# Patient Record
Sex: Female | Born: 1945
Health system: Southern US, Community
[De-identification: ages and names within clinical notes are randomized; demographics above are authoritative.]

## PROBLEM LIST (undated history)

## (undated) DIAGNOSIS — E785 Hyperlipidemia, unspecified: Secondary | ICD-10-CM

## (undated) DIAGNOSIS — I672 Cerebral atherosclerosis: Secondary | ICD-10-CM

## (undated) DIAGNOSIS — C7800 Secondary malignant neoplasm of unspecified lung: Secondary | ICD-10-CM

## (undated) DIAGNOSIS — K219 Gastro-esophageal reflux disease without esophagitis: Secondary | ICD-10-CM

## (undated) DIAGNOSIS — G459 Transient cerebral ischemic attack, unspecified: Secondary | ICD-10-CM

## (undated) DIAGNOSIS — R0602 Shortness of breath: Secondary | ICD-10-CM

## (undated) DIAGNOSIS — M8580 Other specified disorders of bone density and structure, unspecified site: Secondary | ICD-10-CM

## (undated) DIAGNOSIS — R0789 Other chest pain: Secondary | ICD-10-CM

## (undated) DIAGNOSIS — K529 Noninfective gastroenteritis and colitis, unspecified: Secondary | ICD-10-CM

## (undated) DIAGNOSIS — G2581 Restless legs syndrome: Secondary | ICD-10-CM

## (undated) DIAGNOSIS — I10 Essential (primary) hypertension: Secondary | ICD-10-CM

## (undated) DIAGNOSIS — C801 Malignant (primary) neoplasm, unspecified: Secondary | ICD-10-CM

## (undated) HISTORY — DX: Other chest pain: R07.89

## (undated) HISTORY — DX: Restless legs syndrome: G25.81

## (undated) HISTORY — PX: ABDOMINAL EXPLORATION SURGERY: SHX538

## (undated) HISTORY — PX: CAROTID ENDARTERECTOMY: SUR193

## (undated) HISTORY — DX: Gastro-esophageal reflux disease without esophagitis: K21.9

## (undated) HISTORY — DX: Essential (primary) hypertension: I10

## (undated) HISTORY — DX: Hyperlipidemia, unspecified: E78.5

## (undated) HISTORY — DX: Cerebral atherosclerosis: I67.2

## (undated) HISTORY — DX: Shortness of breath: R06.02

## (undated) HISTORY — DX: Transient cerebral ischemic attack, unspecified: G45.9

---

## 1999-05-30 ENCOUNTER — Emergency Department (HOSPITAL_COMMUNITY): Admission: EM | Admit: 1999-05-30 | Discharge: 1999-05-30 | Payer: Self-pay | Admitting: Emergency Medicine

## 1999-05-30 ENCOUNTER — Encounter: Payer: Self-pay | Admitting: Emergency Medicine

## 1999-12-02 ENCOUNTER — Encounter: Payer: Self-pay | Admitting: Emergency Medicine

## 1999-12-02 ENCOUNTER — Inpatient Hospital Stay (HOSPITAL_COMMUNITY): Admission: EM | Admit: 1999-12-02 | Discharge: 1999-12-06 | Payer: Self-pay | Admitting: Emergency Medicine

## 1999-12-05 ENCOUNTER — Encounter: Payer: Self-pay | Admitting: Orthopedic Surgery

## 2001-08-19 ENCOUNTER — Encounter: Payer: Self-pay | Admitting: Family Medicine

## 2001-08-19 ENCOUNTER — Ambulatory Visit (HOSPITAL_COMMUNITY): Admission: RE | Admit: 2001-08-19 | Discharge: 2001-08-19 | Payer: Self-pay | Admitting: Family Medicine

## 2001-09-09 ENCOUNTER — Ambulatory Visit (HOSPITAL_COMMUNITY): Admission: RE | Admit: 2001-09-09 | Discharge: 2001-09-09 | Payer: Self-pay | Admitting: Family Medicine

## 2001-09-09 ENCOUNTER — Encounter: Payer: Self-pay | Admitting: Family Medicine

## 2001-11-14 ENCOUNTER — Emergency Department (HOSPITAL_COMMUNITY): Admission: EM | Admit: 2001-11-14 | Discharge: 2001-11-14 | Payer: Self-pay | Admitting: Emergency Medicine

## 2002-02-23 ENCOUNTER — Ambulatory Visit (HOSPITAL_COMMUNITY): Admission: RE | Admit: 2002-02-23 | Discharge: 2002-02-23 | Payer: Self-pay | Admitting: Internal Medicine

## 2002-02-23 ENCOUNTER — Encounter: Payer: Self-pay | Admitting: Internal Medicine

## 2002-02-24 ENCOUNTER — Encounter: Admission: RE | Admit: 2002-02-24 | Discharge: 2002-02-24 | Payer: Self-pay | Admitting: Internal Medicine

## 2002-02-24 ENCOUNTER — Encounter: Payer: Self-pay | Admitting: Internal Medicine

## 2003-02-23 ENCOUNTER — Ambulatory Visit (HOSPITAL_COMMUNITY): Admission: RE | Admit: 2003-02-23 | Discharge: 2003-02-23 | Payer: Self-pay | Admitting: Family Medicine

## 2003-02-23 ENCOUNTER — Encounter: Payer: Self-pay | Admitting: Family Medicine

## 2003-07-20 ENCOUNTER — Encounter: Payer: Self-pay | Admitting: Family Medicine

## 2003-07-20 ENCOUNTER — Ambulatory Visit (HOSPITAL_COMMUNITY): Admission: RE | Admit: 2003-07-20 | Discharge: 2003-07-20 | Payer: Self-pay | Admitting: Family Medicine

## 2003-08-13 ENCOUNTER — Encounter: Admission: RE | Admit: 2003-08-13 | Discharge: 2003-08-13 | Payer: Self-pay | Admitting: Family Medicine

## 2003-08-13 ENCOUNTER — Encounter: Payer: Self-pay | Admitting: Family Medicine

## 2003-09-03 ENCOUNTER — Encounter: Payer: Self-pay | Admitting: Family Medicine

## 2003-09-03 ENCOUNTER — Observation Stay (HOSPITAL_COMMUNITY): Admission: AD | Admit: 2003-09-03 | Discharge: 2003-09-04 | Payer: Self-pay | Admitting: Family Medicine

## 2004-02-13 ENCOUNTER — Emergency Department (HOSPITAL_COMMUNITY): Admission: EM | Admit: 2004-02-13 | Discharge: 2004-02-13 | Payer: Self-pay | Admitting: Emergency Medicine

## 2004-02-17 ENCOUNTER — Ambulatory Visit (HOSPITAL_COMMUNITY): Admission: RE | Admit: 2004-02-17 | Discharge: 2004-02-17 | Payer: Self-pay | Admitting: Family Medicine

## 2004-10-15 ENCOUNTER — Emergency Department (HOSPITAL_COMMUNITY): Admission: EM | Admit: 2004-10-15 | Discharge: 2004-10-15 | Payer: Self-pay | Admitting: Emergency Medicine

## 2004-10-16 ENCOUNTER — Ambulatory Visit: Payer: Self-pay | Admitting: Orthopedic Surgery

## 2004-10-24 ENCOUNTER — Ambulatory Visit (HOSPITAL_COMMUNITY): Admission: RE | Admit: 2004-10-24 | Discharge: 2004-10-24 | Payer: Self-pay | Admitting: Orthopedic Surgery

## 2004-11-01 ENCOUNTER — Ambulatory Visit: Payer: Self-pay | Admitting: Orthopedic Surgery

## 2004-11-06 ENCOUNTER — Encounter (HOSPITAL_COMMUNITY): Admission: RE | Admit: 2004-11-06 | Discharge: 2004-12-06 | Payer: Self-pay | Admitting: Orthopedic Surgery

## 2004-11-29 ENCOUNTER — Ambulatory Visit: Payer: Self-pay | Admitting: Orthopedic Surgery

## 2004-12-08 ENCOUNTER — Encounter (HOSPITAL_COMMUNITY): Admission: RE | Admit: 2004-12-08 | Discharge: 2005-01-07 | Payer: Self-pay | Admitting: Orthopedic Surgery

## 2004-12-27 ENCOUNTER — Ambulatory Visit: Payer: Self-pay | Admitting: Orthopedic Surgery

## 2005-01-17 ENCOUNTER — Ambulatory Visit: Payer: Self-pay | Admitting: Orthopedic Surgery

## 2005-01-23 ENCOUNTER — Ambulatory Visit (HOSPITAL_COMMUNITY): Admission: RE | Admit: 2005-01-23 | Discharge: 2005-01-23 | Payer: Self-pay | Admitting: Orthopedic Surgery

## 2005-01-23 ENCOUNTER — Ambulatory Visit: Payer: Self-pay | Admitting: Orthopedic Surgery

## 2005-01-25 ENCOUNTER — Encounter (HOSPITAL_COMMUNITY): Admission: RE | Admit: 2005-01-25 | Discharge: 2005-02-24 | Payer: Self-pay | Admitting: Orthopedic Surgery

## 2005-01-25 ENCOUNTER — Ambulatory Visit: Payer: Self-pay | Admitting: Orthopedic Surgery

## 2005-02-26 ENCOUNTER — Encounter (HOSPITAL_COMMUNITY): Admission: RE | Admit: 2005-02-26 | Discharge: 2005-03-28 | Payer: Self-pay | Admitting: Orthopedic Surgery

## 2005-03-14 ENCOUNTER — Ambulatory Visit: Payer: Self-pay | Admitting: Orthopedic Surgery

## 2005-03-30 DIAGNOSIS — R0602 Shortness of breath: Secondary | ICD-10-CM

## 2005-03-30 HISTORY — DX: Shortness of breath: R06.02

## 2005-04-03 ENCOUNTER — Encounter (HOSPITAL_COMMUNITY): Admission: RE | Admit: 2005-04-03 | Discharge: 2005-05-03 | Payer: Self-pay | Admitting: Orthopedic Surgery

## 2005-04-25 ENCOUNTER — Ambulatory Visit: Payer: Self-pay | Admitting: Orthopedic Surgery

## 2005-06-25 ENCOUNTER — Ambulatory Visit: Payer: Self-pay | Admitting: Orthopedic Surgery

## 2005-07-10 ENCOUNTER — Ambulatory Visit (HOSPITAL_COMMUNITY): Admission: RE | Admit: 2005-07-10 | Discharge: 2005-07-10 | Payer: Self-pay | Admitting: Cardiovascular Disease

## 2005-07-16 ENCOUNTER — Ambulatory Visit: Admission: RE | Admit: 2005-07-16 | Discharge: 2005-07-16 | Payer: Self-pay | Admitting: Cardiovascular Disease

## 2005-07-16 HISTORY — PX: OTHER SURGICAL HISTORY: SHX169

## 2005-08-09 ENCOUNTER — Inpatient Hospital Stay (HOSPITAL_COMMUNITY): Admission: RE | Admit: 2005-08-09 | Discharge: 2005-08-10 | Payer: Self-pay | Admitting: *Deleted

## 2005-08-09 ENCOUNTER — Encounter (INDEPENDENT_AMBULATORY_CARE_PROVIDER_SITE_OTHER): Payer: Self-pay | Admitting: *Deleted

## 2006-08-25 ENCOUNTER — Emergency Department (HOSPITAL_COMMUNITY): Admission: EM | Admit: 2006-08-25 | Discharge: 2006-08-25 | Payer: Self-pay | Admitting: Emergency Medicine

## 2007-04-17 ENCOUNTER — Ambulatory Visit: Payer: Self-pay | Admitting: *Deleted

## 2007-05-19 ENCOUNTER — Ambulatory Visit (HOSPITAL_COMMUNITY): Admission: RE | Admit: 2007-05-19 | Discharge: 2007-05-19 | Payer: Self-pay | Admitting: Family Medicine

## 2007-12-11 ENCOUNTER — Ambulatory Visit: Payer: Self-pay | Admitting: *Deleted

## 2008-03-10 ENCOUNTER — Emergency Department (HOSPITAL_COMMUNITY): Admission: EM | Admit: 2008-03-10 | Discharge: 2008-03-10 | Payer: Self-pay | Admitting: Emergency Medicine

## 2008-03-24 ENCOUNTER — Ambulatory Visit (HOSPITAL_COMMUNITY): Admission: RE | Admit: 2008-03-24 | Discharge: 2008-03-24 | Payer: Self-pay | Admitting: Family Medicine

## 2009-05-16 ENCOUNTER — Encounter: Admission: RE | Admit: 2009-05-16 | Discharge: 2009-05-16 | Payer: Self-pay | Admitting: Family Medicine

## 2009-06-29 ENCOUNTER — Emergency Department (HOSPITAL_COMMUNITY): Admission: EM | Admit: 2009-06-29 | Discharge: 2009-06-29 | Payer: Self-pay | Admitting: Emergency Medicine

## 2009-06-30 ENCOUNTER — Encounter: Admission: RE | Admit: 2009-06-30 | Discharge: 2009-06-30 | Payer: Self-pay | Admitting: Family Medicine

## 2009-12-23 ENCOUNTER — Encounter: Admission: RE | Admit: 2009-12-23 | Discharge: 2009-12-23 | Payer: Self-pay | Admitting: Family Medicine

## 2010-01-26 ENCOUNTER — Ambulatory Visit: Payer: Self-pay | Admitting: Cardiology

## 2010-01-26 ENCOUNTER — Inpatient Hospital Stay (HOSPITAL_COMMUNITY)
Admission: EM | Admit: 2010-01-26 | Discharge: 2010-01-27 | Payer: Self-pay | Source: Home / Self Care | Admitting: Emergency Medicine

## 2010-01-27 ENCOUNTER — Encounter (INDEPENDENT_AMBULATORY_CARE_PROVIDER_SITE_OTHER): Payer: Self-pay | Admitting: Internal Medicine

## 2010-01-27 DIAGNOSIS — G459 Transient cerebral ischemic attack, unspecified: Secondary | ICD-10-CM

## 2010-01-27 HISTORY — DX: Transient cerebral ischemic attack, unspecified: G45.9

## 2010-08-01 ENCOUNTER — Ambulatory Visit (HOSPITAL_COMMUNITY): Admission: RE | Admit: 2010-08-01 | Discharge: 2010-08-01 | Payer: Self-pay | Admitting: Family Medicine

## 2010-10-14 ENCOUNTER — Observation Stay (HOSPITAL_COMMUNITY)
Admission: EM | Admit: 2010-10-14 | Discharge: 2010-10-15 | Payer: Self-pay | Source: Home / Self Care | Admitting: Emergency Medicine

## 2010-11-26 ENCOUNTER — Emergency Department (HOSPITAL_COMMUNITY)
Admission: EM | Admit: 2010-11-26 | Discharge: 2010-11-27 | Payer: Self-pay | Source: Home / Self Care | Admitting: Emergency Medicine

## 2010-12-04 LAB — CBC
HCT: 36.7 % (ref 36.0–46.0)
Hemoglobin: 12.6 g/dL (ref 12.0–15.0)
MCH: 31.3 pg (ref 26.0–34.0)
MCHC: 34.3 g/dL (ref 30.0–36.0)
MCV: 91.1 fL (ref 78.0–100.0)
Platelets: 279 10*3/uL (ref 150–400)
RBC: 4.03 MIL/uL (ref 3.87–5.11)
RDW: 12.9 % (ref 11.5–15.5)
WBC: 10.5 10*3/uL (ref 4.0–10.5)

## 2010-12-04 LAB — BASIC METABOLIC PANEL
BUN: 7 mg/dL (ref 6–23)
CO2: 22 mEq/L (ref 19–32)
Calcium: 8.6 mg/dL (ref 8.4–10.5)
Chloride: 103 mEq/L (ref 96–112)
Creatinine, Ser: 0.69 mg/dL (ref 0.4–1.2)
GFR calc Af Amer: >60 mL/min (ref >60)
GFR calc non Af Amer: >60 mL/min (ref >60)
Glucose, Bld: 87 mg/dL (ref 70–99)
Potassium: 4.4 mEq/L (ref 3.5–5.1)
Sodium: 134 mEq/L — ABNORMAL LOW (ref 135–145)

## 2010-12-04 LAB — DIFFERENTIAL
Basophils Absolute: 0 10*3/uL (ref 0.0–0.1)
Basophils Relative: 0 % (ref 0–1)
Eosinophils Absolute: 0.3 10*3/uL (ref 0.0–0.7)
Eosinophils Relative: 3 % (ref 0–5)
Lymphocytes Relative: 9 % — ABNORMAL LOW (ref 12–46)
Lymphs Abs: 1 10*3/uL (ref 0.7–4.0)
Monocytes Absolute: 0.7 10*3/uL (ref 0.1–1.0)
Monocytes Relative: 7 % (ref 3–12)
Neutro Abs: 8.5 10*3/uL — ABNORMAL HIGH (ref 1.7–7.7)
Neutrophils Relative %: 81 % — ABNORMAL HIGH (ref 43–77)

## 2010-12-10 ENCOUNTER — Encounter: Payer: Self-pay | Admitting: Family Medicine

## 2010-12-11 ENCOUNTER — Encounter: Payer: Self-pay | Admitting: Family Medicine

## 2011-01-31 LAB — CBC
HCT: 38.6 % (ref 36.0–46.0)
HCT: 40.2 % (ref 36.0–46.0)
Hemoglobin: 12.4 g/dL (ref 12.0–15.0)
MCH: 31.4 pg (ref 26.0–34.0)
MCHC: 34.1 g/dL (ref 30.0–36.0)
MCV: 92.2 fL (ref 78.0–100.0)
Platelets: 304 10*3/uL (ref 150–400)
RDW: 13 % (ref 11.5–15.5)
WBC: 8.8 10*3/uL (ref 4.0–10.5)

## 2011-01-31 LAB — COMPREHENSIVE METABOLIC PANEL
Albumin: 4.1 g/dL (ref 3.5–5.2)
Alkaline Phosphatase: 86 U/L (ref 39–117)
BUN: 12 mg/dL (ref 6–23)
CO2: 24 mEq/L (ref 19–32)
Chloride: 103 mEq/L (ref 96–112)
Creatinine, Ser: 0.81 mg/dL (ref 0.4–1.2)
GFR calc non Af Amer: >60 mL/min (ref >60)
Glucose, Bld: 121 mg/dL — ABNORMAL HIGH (ref 70–99)
Potassium: 3.4 mEq/L — ABNORMAL LOW (ref 3.5–5.1)
Total Bilirubin: 0.7 mg/dL (ref 0.3–1.2)

## 2011-01-31 LAB — DIFFERENTIAL
Basophils Absolute: 0 10*3/uL (ref 0.0–0.1)
Basophils Relative: 0 % (ref 0–1)
Monocytes Absolute: 0.9 10*3/uL (ref 0.1–1.0)
Neutro Abs: 9.2 10*3/uL — ABNORMAL HIGH (ref 1.7–7.7)
Neutrophils Relative %: 68 % (ref 43–77)

## 2011-01-31 LAB — BRAIN NATRIURETIC PEPTIDE: Pro B Natriuretic peptide (BNP): <30 pg/mL (ref 0.0–100.0)

## 2011-01-31 LAB — CARDIAC PANEL(CRET KIN+CKTOT+MB+TROPI)
CK, MB: 4.9 ng/mL — ABNORMAL HIGH (ref 0.3–4.0)
Relative Index: 1.6 (ref 0.0–2.5)
Troponin I: 0.01 ng/mL (ref 0.00–0.06)

## 2011-01-31 LAB — URINALYSIS, ROUTINE W REFLEX MICROSCOPIC
Glucose, UA: NEGATIVE mg/dL
pH: 7 (ref 5.0–8.0)

## 2011-01-31 LAB — URINE CULTURE: Culture  Setup Time: 201111261725

## 2011-01-31 LAB — LIPASE, BLOOD: Lipase: 38 U/L (ref 11–59)

## 2011-01-31 LAB — CK TOTAL AND CKMB (NOT AT ARMC): Total CK: 343 U/L — ABNORMAL HIGH (ref 7–177)

## 2011-02-11 LAB — URINALYSIS, ROUTINE W REFLEX MICROSCOPIC
Bilirubin Urine: NEGATIVE
Ketones, ur: NEGATIVE mg/dL
Nitrite: NEGATIVE
Urobilinogen, UA: 0.2 mg/dL (ref 0.0–1.0)
pH: 6.5 (ref 5.0–8.0)

## 2011-02-11 LAB — CBC
HCT: 33.3 % — ABNORMAL LOW (ref 36.0–46.0)
Hemoglobin: 11.6 g/dL — ABNORMAL LOW (ref 12.0–15.0)
Hemoglobin: 11.8 g/dL — ABNORMAL LOW (ref 12.0–15.0)
MCHC: 34.6 g/dL (ref 30.0–36.0)
MCHC: 34.8 g/dL (ref 30.0–36.0)
RBC: 3.64 MIL/uL — ABNORMAL LOW (ref 3.87–5.11)
RDW: 13 % (ref 11.5–15.5)
WBC: 7.1 10*3/uL (ref 4.0–10.5)

## 2011-02-11 LAB — CARDIAC PANEL(CRET KIN+CKTOT+MB+TROPI)
Relative Index: 1.2 (ref 0.0–2.5)
Total CK: 247 U/L — ABNORMAL HIGH (ref 7–177)
Total CK: 253 U/L — ABNORMAL HIGH (ref 7–177)

## 2011-02-11 LAB — DIFFERENTIAL
Basophils Absolute: 0 10*3/uL (ref 0.0–0.1)
Basophils Relative: 1 % (ref 0–1)
Basophils Relative: 1 % (ref 0–1)
Eosinophils Absolute: 0.2 10*3/uL (ref 0.0–0.7)
Eosinophils Relative: 3 % (ref 0–5)
Eosinophils Relative: 4 % (ref 0–5)
Lymphocytes Relative: 43 % (ref 12–46)
Monocytes Absolute: 0.6 10*3/uL (ref 0.1–1.0)
Neutrophils Relative %: 53 % (ref 43–77)

## 2011-02-11 LAB — COMPREHENSIVE METABOLIC PANEL
ALT: 11 U/L (ref 0–35)
AST: 22 U/L (ref 0–37)
Alkaline Phosphatase: 68 U/L (ref 39–117)
CO2: 25 mEq/L (ref 19–32)
Chloride: 110 mEq/L (ref 96–112)
GFR calc non Af Amer: >60 mL/min (ref >60)
Glucose, Bld: 95 mg/dL (ref 70–99)
Potassium: 4.1 mEq/L (ref 3.5–5.1)
Sodium: 140 mEq/L (ref 135–145)
Total Bilirubin: 0.9 mg/dL (ref 0.3–1.2)

## 2011-02-11 LAB — LIPID PANEL
Cholesterol: 277 mg/dL — ABNORMAL HIGH (ref 0–200)
LDL Cholesterol: 207 mg/dL — ABNORMAL HIGH (ref 0–99)
VLDL: 31 mg/dL (ref 0–40)

## 2011-02-11 LAB — RAPID URINE DRUG SCREEN, HOSP PERFORMED
Cocaine: NOT DETECTED
Tetrahydrocannabinol: NOT DETECTED

## 2011-02-11 LAB — BASIC METABOLIC PANEL
CO2: 23 mEq/L (ref 19–32)
Chloride: 107 mEq/L (ref 96–112)
Glucose, Bld: 110 mg/dL — ABNORMAL HIGH (ref 70–99)
Potassium: 3.1 mEq/L — ABNORMAL LOW (ref 3.5–5.1)
Sodium: 138 mEq/L (ref 135–145)

## 2011-02-11 LAB — CK TOTAL AND CKMB (NOT AT ARMC): Relative Index: 1.1 (ref 0.0–2.5)

## 2011-02-11 LAB — POCT CARDIAC MARKERS: Troponin i, poc: <0.05 ng/mL (ref 0.00–0.09)

## 2011-02-11 LAB — RPR: RPR Ser Ql: NONREACTIVE

## 2011-02-11 LAB — APTT
aPTT: 24 seconds (ref 24–37)
aPTT: 27 seconds (ref 24–37)

## 2011-02-11 LAB — TSH: TSH: 0.839 u[IU]/mL (ref 0.350–4.500)

## 2011-02-11 LAB — PROTIME-INR
INR: 0.98 (ref 0.00–1.49)
INR: 1.06 (ref 0.00–1.49)

## 2011-03-08 ENCOUNTER — Other Ambulatory Visit (HOSPITAL_COMMUNITY): Payer: Self-pay | Admitting: Family Medicine

## 2011-03-08 ENCOUNTER — Ambulatory Visit (HOSPITAL_COMMUNITY)
Admission: RE | Admit: 2011-03-08 | Discharge: 2011-03-08 | Disposition: A | Discharge status: Home / Self Care | Payer: BC Managed Care – PPO | Source: Ambulatory Visit | Attending: Family Medicine | Admitting: Family Medicine

## 2011-03-08 DIAGNOSIS — R52 Pain, unspecified: Secondary | ICD-10-CM

## 2011-03-08 DIAGNOSIS — M5030 Other cervical disc degeneration, unspecified cervical region: Secondary | ICD-10-CM | POA: Insufficient documentation

## 2011-03-08 DIAGNOSIS — M25519 Pain in unspecified shoulder: Secondary | ICD-10-CM

## 2011-03-08 DIAGNOSIS — M542 Cervicalgia: Secondary | ICD-10-CM | POA: Insufficient documentation

## 2011-04-03 NOTE — Assessment & Plan Note (Signed)
OFFICE VISIT   Mullins, Lindsey L  DOB:  August 25, 1946                                       12/11/2007  CHART#:[id number]   The patient underwent a left carotid endarterectomy in September 2006.  She follows up on a regular basis for scans.  She presented to the  office today complaining of some tightness in her scar.   I have briefly looked at her today.  The scar looks entirely normal.  No  masses are palpable in her neck.  I did instruct her regarding some scar  massage over the area to help with this tight feeling.  She will return  per routine protocol.   Balinda Quails, M.D.  Electronically Signed   PGH/MEDQ  D:  12/11/2007  T:  12/12/2007  Job:  [id number]

## 2011-04-06 NOTE — H&P (Signed)
NAME:  Lindsey Mullins, Lindsey Mullins                        ACCOUNT NO.:  [id number]   MEDICAL RECORD NO.:  [id number]                   PATIENT TYPE:  INP   LOCATION:  A223                                 FACILITY:  [facility]   PHYSICIAN:  Annia Friendly. Loleta Chance, M.D.                DATE OF BIRTH:  1946/04/28   DATE OF ADMISSION:  09/03/2003  DATE OF DISCHARGE:                                HISTORY & PHYSICAL   HISTORY:  The patient is a 65 year old divorced gravida 2, para 2, AB 0  black female from [place], [place].  Chief complaint is feeling  faint like.  The patient has been feeling faint like off and on since 1100  hours on the day of admission.  Symptoms occurred at home.  She also  experienced a feeling of hotness followed by this feeling of faintness.  She  would attempt to relieve the symptoms by drinking a lot of cold water.  She  denied nausea, vomiting, chest pain, jaw pain, diarrhea, shortness of  breath, dysuria, gross hematuria, fever, chills, palpitations, melena, etc.   Medical history is negative for known heart disease.  Medical history is  positive for hypertension.  Medical history is also negative for diabetes,  tuberculosis, cancer, sickle cell, asthma, and seizure disorder.   Habits positive for former cigarette smoking x5 years.  Habits also negative  for use of ethanol and street drugs.   Prescribed med is Diovan HCT 80/12.5 mg p.o. every day.  Over-the-counter  med is ibuprofen 200 mg three tablets p.o. three times daily p.r.n.   Past medical history is positive for hospitalization for tonsillectomy;  hospitalization for pregnancy; hospitalization for pneumonia in her 35s at  [facility]; acute bronchiolitis with bronchospasm August 1998; and  exploratory abdominal surgery [facility] in January 2001.   Family history revealed father deceased age 32 cause unknown; mother living  age 34 with history of heart disease; one brother living age 58  health  unknown; one sister living age 3 good health; two daughters living ages 44  and 53 good health.   Review of systems is negative for epistaxis, bleeding gums, hematemesis,  hemoptysis, chronic cough, wheezing, dysuria, melena, diarrhea,  constipation, etc.  Review of systems is positive for chronic lower back  pain, chronic bilateral leg pain, episodic chronic left shoulder pain.   PHYSICAL EXAMINATION:  VITALS:  Temperature 97.1, pulse 66, respirations 20,  blood pressure 130/80, O2 saturation 97% on room air, weight 181 pounds.  GENERAL APPEARANCE:  Middle-aged medium-height alert black female who  appeared not to feel well but in no apparent respiratory distress.  SKIN:  Warm and dry.  HEAD:  Normocephalic.  EARS:  Normal auricles.  EYES:  Lids negative for ptosis.  Sclerae white.  Pupils round, equal, and  reactive to light.  Extraocular movement intact.  NOSE:  Negative for discharge.  MOUTH:  Positive for  upper and lower dentures.  No oral lesions.  Posterior  pharynx benign.  NECK:  Negative for lymphadenopathy or thyromegaly.  LUNGS:  Clear.  HEART:  Audible S1 and S2 without murmur.  Regular rate and rhythm.  BREASTS:  No skin changes.  Nipples erect.  ABDOMEN:  Slightly obese.  Hyperactive bowel sounds.  Positive old healed  mid-hypogastric surgical scar.  Soft.  Positive for mild left upper quadrant  tenderness to deep palpation.  No organomegaly.  No rebound tenderness.  PELVIC:  Deferred.  RECTAL:  Deferred.  EXTREMITIES:  No edema.  No joint swelling.  No joint redness.  No joint  hotness.  NEUROLOGICAL:  Alert and oriented to person, place, and time.  Cranial  nerves II-XII appear intact.  Babinski downward bilaterally.   IMPRESSION:  Primary near syncope.   SECONDARY DIAGNOSIS:  Hypertension.   ADDENDUM:  The patient is allergic to CODEINE and PENICILLIN.   PLAN:  Admit to telemetry, observation, IV normal saline at [facility], thyroid  function, cardiac  enzymes, CBC, MET-7, urinalysis, liver function tests,  fasting lipid profile, chest x-ray, EKG, bedside commode, Diovan HCT 80/12.5  mg p.o. every day, diet 4 g sodium/low cholesterol, one aspirin 325 mg p.o.  every day.     ___________________________________________                                         Annia Friendly. Loleta Chance, M.D.   Levonne Hubert  D:  09/03/2003  T:  09/03/2003  Job:  [id number]

## 2011-04-06 NOTE — H&P (Signed)
NAME:  Lindsey Mullins, Lindsey Mullins              ACCOUNT NO.:  [id number]   MEDICAL RECORD NO.:  [id number]          PATIENT TYPE:  AMB   LOCATION:  DAY                           FACILITY:  [facility]   PHYSICIAN:  Vickki Hearing, M.D.DATE OF BIRTH:  [birth year]   DATE OF ADMISSION:  DATE OF DISCHARGE:  LH                                HISTORY & PHYSICAL   CHIEF COMPLAINT:  Left shoulder pain.   HISTORY:  Lindsey Mullins is 65 years old.  She had left shoulder pain for almost two  years.  It worsened in November of 2005.  She went to the emergency room.  She was given a cortisone injection IM and an anti-inflammatory, which was  probably Toradol.  She presented to [place].  We worked her up and treated her  for bursal side partial tearing of the rotator cuff, most likely stage 2  disease.  She had significant motion loss and was sent to physical therapy  to improve her range of motion.  She improved it to above 120 degrees in  flexion and now presents for subacromial decompression.   ALLERGIES:  She has allergies to CODEINE and PENICILLIN.   FAMILY PHYSICIAN:  Dr. Loleta Chance.   REVIEW OF SYSTEMS:  She listed as normal.   MEDICAL PROBLEMS:  She said none.   SURGERY:  Exploratory lap.   MEDICATIONS:  Baby aspirin.   FAMILY HISTORY:  Negative.   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   PHYSICAL EXAMINATION:  WEIGHT:  173 pounds.  VITAL SIGNS:  Pulse 82, respiratory rate 16.  GENERAL APPEARANCE:  Development, nutrition, grooming and hygiene are  normal.  Her gait and station were normal.  LYMPH NODES:  Normal in the cervical, supraclavicular and axillae.  EXTREMITIES:  Her left shoulder had passive range of motion 120-130 degrees.  She had a positive impingement sign and a weak rotator cuff, but intact  rotator cuff.  There was normal neurovascular to the extremities.  NEUROLOGIC:  Her reflexes were normal.   LABORATORY DATA:  Her x-rays showed a normal glenohumeral joint and a  tendinosis  of the rotator cuff tendon.   DIAGNOSIS:  Rotator cuff syndrome, left shoulder.   PLAN:  1.  Arthroscopy of left shoulder.  2.  Subacromial decompression.      SEH/MEDQ  D:  01/22/2005  T:  01/22/2005  Job:  [id number]

## 2011-04-06 NOTE — Discharge Summary (Signed)
[place]. [facility]  Patient:    Lindsey Mullins                      MRN: [id number] Adm. Date:  [id number] Disc. Date: [id number] Attending:  Stephenie Acres                           Discharge Summary  ADMISSION DIAGNOSIS:  Acute abdomen.  DISCHARGE DIAGNOSES: 1. Acute abdomen. 2. Pneumonia.  PROCEDURES:  Exploratory laparotomy on December 02, 1999.  HISTORY OF PRESENT ILLNESS:  The patient is a 65 year old black female with acute onset of abdominal pain, chills, nausea, and diarrhea for 24 hours.  The pain continued through the night prior to presentation.  She denies any prior episodes of this.  She had a white count in the emergency room of 13,000, and a CT scan which showed inflammatory changes of the mid small bowel.  HOSPITAL COURSE:  The patient was admitted, given IV antibiotics, taken to the operating room, and underwent exploratory laparotomy, which showed no evidence of pathology.  The patient remained febrile for the next 24 hours.  Chest x-ray showed a right basilar infiltrate.  She was continued on IV antibiotics. Blood and urine cultures were negative.  Her diet was increased over the next 48 hours and, by postoperative day #3, she was beginning a regular diet. Saturations were 95% on room air.  Repeat chest x-ray continued to show a right infiltrate, but it was improved.  The patient remained afebrile for approximately 24 hours, and was ready for discharge home.  DISCHARGE MEDICATIONS: 1. Vicodin for pain. 2. Augmentin 875 mg twice daily.  FOLLOW-UP:  With me in one week, and with her family doctor in three to four days for further follow-up of her pneumonia.  CONDITION ON DISCHARGE:  Good and improved. DD:  02/07/00 TD:  02/07/00 Job: [id number]

## 2011-04-06 NOTE — H&P (Signed)
NAME:  Lindsey Mullins, Lindsey Mullins              ACCOUNT NO.:  [id number]   MEDICAL RECORD NO.:  [id number]          PATIENT TYPE:  AMB   LOCATION:  DFTL                         FACILITY:  [facility]   PHYSICIAN:  Zollie Scale, PA          DATE OF BIRTH:  [birth year]   DATE OF ADMISSION:  07/16/2005  DATE OF DISCHARGE:  07/16/2005                                HISTORY & PHYSICAL   Lindsey Mullins is a 65 year old female who was referred by Dr. Mirna Mires to  Dr. Allyson Sabal for chest pain.  Echocardiogram revealed normal LV size and  function.  Stress test was notable for hypertensive response on exercise and  deconditioning but no ischemia, with good LV function.  Carotid Doppler done  as an outpatient for a left carotid bruit revealed a greater than 70% left  internal carotid artery stenosis.  She saw Dr. Allyson Sabal in followup.  Her chest  pain had improved with Prilosec.  Dr. Allyson Sabal felt she should be set up for an  outpatient angiogram to evaluate her carotid stenosis.  This was done  electively on July 16, 2005.   PAST MEDICAL HISTORY:  Remarkable for hyperlipidemia.   HOME MEDICATIONS:  Prilosec daily and aspirin.   She is allergic to PENICILLIN.   SOCIAL HISTORY:  Patient is divorced.  Two children.  One grandchild.   FAMILY HISTORY:  Unremarkable.   REVIEW OF SYSTEMS:  Essentially unremarkable except for noted above.  Please  see office records from July 02, 2005 for complete review of systems.   PHYSICAL EXAMINATION:  VITAL SIGNS:  Blood pressure 108/70, pulse 66,  respirations 12.  GENERAL:  She is a well-developed and well-nourished African-American female  in no acute distress.  HEENT:  Normocephalic.  Extraocular movements are intact.  Sclerae are  anicteric.  NECK:  Without bruits.  CHEST:  Clear to auscultation and percussion.  CARDIAC:  Regular rate and rhythm without murmur, rub or gallop.  ABDOMEN:  Nontender, nondistended.  EXTREMITIES:  Intact distal pulses.  NEURO:  Grossly intact.   She is awake, alert and oriented.  Cooperative.   IMPRESSION:  1.  Abnormal carotid Doppler study with greater than 70% left internal      carotid artery stenosis by Doppler.  2.  Chest pain with negative Cardiolite, responded to PPI.   PLAN:  Patient will be admitted for elective cerebral angiogram.      Abelino Derrick, P.A.    ______________________________  Zollie Scale, PA    LKK/MEDQ  D:  11/15/2005  T:  11/15/2005  Job:  [id number]

## 2011-04-06 NOTE — Op Note (Signed)
NAME:  Lindsey Mullins, Lindsey Mullins              ACCOUNT NO.:  [id number]   MEDICAL RECORD NO.:  [id number]          PATIENT TYPE:  AMB   LOCATION:  DAY                           FACILITY:  [facility]   PHYSICIAN:  Vickki Hearing, M.D.DATE OF BIRTH:  Feb 14, 1946   DATE OF PROCEDURE:  01/23/2005  DATE OF DISCHARGE:                                 OPERATIVE REPORT   PREOPERATIVE DIAGNOSIS:  Rotator cuff syndrome, left shoulder, possible  rotator cuff tear.   POSTOPERATIVE DIAGNOSIS:  Rotator cuff syndrome, left shoulder.   PROCEDURE:  Arthroscopy, left shoulder, arthroscopic subacromial  decompression.   SURGEON:  Dr. Romeo Apple.   ANESTHETIC:  General.   OPERATIVE FINDINGS:  Bursitis subacromial space, intra-articular structures  normal, undersurface rotator cuff normal, biceps tendon normal, glenohumeral  joint normal, ligaments and axillary pouch normal, subacromial space with  significant and thickened bursal tissue which showed generative changes in  the rotator cuff.   PRIMARY INDICATION:  Pain.   HISTORY:  A 65 year old female presented with a locked shoulder, was sent  therapy for adhesive capsulitis. Once she regained a range of motion, her  pain persisted. She had signs and symptoms of impingement syndrome and  rotator cuff syndrome and presented for surgery when conservative treatment  failed.   The patient was identified in preop holding area as [name]. The  history and physical was updated, and her left shoulder was countersigned by  the surgeon after being marked by the patient. She was given Ancef and taken  to the operating room where general anesthetic was administered. She was  placed in the beach chair position, and a sterile prep and prep and drape  were performed. Time-out was taken and completed as required, confirming  that the patient had received antibiotics with an hour, all equipment was  present in the room, that the proper limb and proper the patient had  been  prepped for surgery.   Diagnostic arthroscopy was performed through a posterior approach.  Glenohumeral joint was evaluated completely. All structures were normal.  Scope was placed in subacromial space, and a lateral portal was established  with the assistance of spinal needle, and a cannula was inserted in the  subacromial space. The subacromial space was debrided of the thickened  bursal tissue over to the level of the [facility] joint, and a decompression was  performed with a  straight barrel bur. Subacromial space was irrigated and the portals were  closed with 3-0 Prolene. Subacromial space was injected 30 cc of Marcaine.  Sterile bandages were applied along with a CryoCuff. The patient was  extubated and taken to the recovery room in stable condition.      SEH/MEDQ  D:  01/23/2005  T:  01/23/2005  Job:  [id number]

## 2011-04-06 NOTE — Discharge Summary (Signed)
NAME:  Lindsey Mullins, Lindsey Mullins              ACCOUNT NO.:  [id number]   MEDICAL RECORD NO.:  [id number]          PATIENT TYPE:  INP   LOCATION:  3307                         FACILITY:  [facility]   PHYSICIAN:  Balinda Quails, M.D.    DATE OF BIRTH:  11/29/1945   DATE OF ADMISSION:  08/09/2005  DATE OF DISCHARGE:  08/10/2005                                 DISCHARGE SUMMARY   PRIMARY DISCHARGE DIAGNOSIS:  Left internal carotid artery stenosis.   SECONDARY DIAGNOSIS:  Hypertension.   IN-HOSPITAL OPERATIONS AND PROCEDURES:  Left carotid endarterectomy with  Dacron patch angioplasty.   HISTORY AND PHYSICAL AND HOSPITAL COURSE:  The patient is a 65 year old  African-American female who was referred to Dr. Madilyn Fireman by Dr. Allyson Sabal following  diagnostic cerebral arteriography revealing a severe left internal carotid  artery stenosis.  She had been initially seen and evaluated by Dr. Domingo Sep  in [place].  Her carotid Doppler evaluation revealed a severe left  internal carotid artery stenosis.  The patient has no history of documented  stroke.  She denied sensory, motor or visual deficit.  She was seen and  evaluated by Dr. Madilyn Fireman.  Dr. Madilyn Fireman discussed with her undergoing left  carotid endarterectomy.  He discussed risks and benefits of the procedure.  The patient acknowledged her understanding and wished to proceed.  Surgery  was scheduled for August 09, 2005.   The patient was taken to the operating room on August 09, 2005, where she  underwent left carotid endarterectomy with Dacron patch angioplasty.  The  patient tolerated this procedure well and was transferred up to the  intensive care unit in stable condition.  Postoperatively the patient was  seen to have neurologic intact, hemodynamically stable.  Postoperative day  1, the patient was out of bed, ambulating well.  She was in normal sinus  rhythm.  Incision was dry and intact and healing well.  She remained  hemodynamically stable.   Neurologic remained intact.  She was saturating  greater than 90% on room air.  The patient was afebrile.   The patient was discharged to home postoperative day 1, August 10, 2005,  in stable condition.  A follow-up appointment will be scheduled with Dr.  Madilyn Fireman in three weeks.  Ms. Vanderstelt received instructions on diet, activity  level and incisional care.  She was told no driving until released to do so,  no heavy lifting over 10 pounds.  She was told to ambulate three to four  times per day, progress as tolerated.  She was told to continue her  breathing exercises.  The patient was told she was allowed to shower,  washing her incisions using soap and water.  She is to contact the office if  she develops any drainage or opening from any of her incision sites.  She  was educated on diet to be low-fat, low-salt.  The patient acknowledged her  understanding.   DISCHARGE MEDICATIONS:  1.  Tylox one to two tablets p.o. q.4-6h. p.r.n. pain.  2.  Caduet 5/10 mg daily.  3.  Aspirin 81 mg daily.  4.  Plavix 75  mg daily.  5.  Skelaxin 800 mg p.r.n.  6.  Prilosec over-the-counter 20 mg daily.      [name], PA      [name], M.D.  Electronically Signed    KMD/MEDQ  D:  10/02/2005  T:  10/03/2005  Job:  [id number]

## 2011-04-06 NOTE — Cardiovascular Report (Signed)
NAME:  RAKIYAH, ESCH              ACCOUNT NO.:  [id number]   MEDICAL RECORD NO.:  [id number]          PATIENT TYPE:  AMB   LOCATION:  DFTL                         FACILITY:  [facility]   PHYSICIAN:  Nanetta Batty, M.D.   DATE OF BIRTH:  1946-06-27   DATE OF PROCEDURE:  07/16/2005  DATE OF DISCHARGE:                              CARDIAC CATHETERIZATION   Ms. Ode is a 65 year old African American female patient of Dr.  Roque Lias referred to me for evaluation of carotid disease.  She has had an  echo that reveals normal LV function and a cardiac stress test which was  normal.  Dopplers reveal high grade left ICA stenosis.  She is  neurologically asymptomatic on aspirin.  She presents now for outpatient  diagnostic cerebral angiography.   DESCRIPTION OF PROCEDURE:  The patient was brought to the sixth floor [facility] in the postabsorptive state.  Her right groin was prepped and shaved in the usual sterile fashion.  1%  Xylocaine was used for local anesthesia.  A 5 French sheath was inserted  into the right femoral artery using standard Seldinger technique.  A 5  French pigtail catheter and JV1 catheters were used for arch angiography and  four vessel cerebral angiography.  Visipaque dye was used for the entirety  of the case.  Retrograde aortic pressures were monitored during the case.  Dr. Susa Griffins was the primary operator and I was the proctor for the  case.   ANGIOGRAPHIC RESULTS:  1.  Arch aortogram:  1-2 arch.  2.  Right vertebral:  There was a proximal kink that did not appear to be      hemodynamically significant and a 50% smooth segmental basilar artery      stenosis on the right side.  There was competitive flow noted.  3.  Right carotid:  50% ulcerated proximal right ICA stenosis.  The      remaining carotid filled the anterior cerebral arteries.  4.  Left carotid:  60-70% left external carotid artery stenosis.  90%  proximal left ICA stenosis with normal intracranial anatomy.  There was      occasional reflux into the anterior cerebral on the left.  5.  Left vertebral:  Normal intracranial and extracranial anatomy.   IMPRESSION:  Ms. Pryce has a mild ulcerated right ICA stenosis and a high  grade left ICA stenosis.  She is neurologically asymptomatic.  I am going to  begin her on Plavix and discharge her home as an outpatient today.  She will see me back in the office later this week at which time I will  refer to Dr. Liliane Bade for surgical evaluation for left carotid  endarterectomy.  The patient is low risk and asymptomatic, thus, not a stent  candidate.  The sheath was removed and pressure was held on the groin to  achieve hemostasis.  The patient left the lab in stable condition.      Nanetta Batty, M.D.  Electronically Signed     JB/MEDQ  D:  07/16/2005  T:  07/16/2005  Job:  [id number]  cc:   Dani Gobble, MD  Fax: [phone]   Annia Friendly. Loleta Chance, MD  [address]  [place]  [place] [address]  Fax: [phone]

## 2011-04-06 NOTE — Op Note (Signed)
[place]. [facility]  Patient:    Lindsey Mullins                      MRN: [id number] Proc. Date: 12/02/99 Adm. Date:  [id number] Attending:  Stephenie Acres                           Operative Report  PREOPERATIVE DIAGNOSIS:  Peritonitis.  POSTOPERATIVE DIAGNOSIS:  No pathology, intraabdominal pathology found.  PROCEDURE:  Exploratory laparotomy.  SURGEON:  Catalina Lunger, M.D.  ASSISTANT:  Anselm Pancoast. Zachery Dakins, M.D.  ANESTHESIA:  General.  DESCRIPTION OF PROCEDURE:  Patient was taken to the operating room, placed in the supine position.  After adequate anesthesia was induced using endotracheal tube, the abdomen was prepped and draped in a normal sterile fashion.  Using a lower vertical midline incision, dissected down to the fascia.  Fascia was opened vertically.  The peritoneum was entered.  There was no evidence of free fluid in the abdomen.  The GI tract was run from the stomach to the rectum with particular attention paid to the proximal and mid small bowel.  There was no evidence of ischemia, inflammation or Crohns disease, only significant finding was a large uterus with no free fluid in the cul-de-sac and no evidence of ischemia. Gallbladder was within normal limits.  Finding no further pathology, the fascia was closed with a running #1 Novofil.  Skin was closed with staples.  The patient tolerated the procedure well and went to  PACU in good condition. DD:  12/02/99 TD:  12/02/99 Job: [id number]

## 2012-04-01 ENCOUNTER — Encounter: Payer: Self-pay | Admitting: Family Medicine

## 2012-04-01 ENCOUNTER — Ambulatory Visit (INDEPENDENT_AMBULATORY_CARE_PROVIDER_SITE_OTHER): Payer: BC Managed Care – PPO | Admitting: Family Medicine

## 2012-04-01 VITALS — BP 122/80 | HR 67 | Resp 16 | Ht 66.5 in | Wt 187.0 lb

## 2012-04-01 DIAGNOSIS — I10 Essential (primary) hypertension: Secondary | ICD-10-CM

## 2012-04-01 DIAGNOSIS — R1310 Dysphagia, unspecified: Secondary | ICD-10-CM

## 2012-04-01 DIAGNOSIS — G2581 Restless legs syndrome: Secondary | ICD-10-CM

## 2012-04-01 DIAGNOSIS — E785 Hyperlipidemia, unspecified: Secondary | ICD-10-CM

## 2012-04-01 MED ORDER — CARVEDILOL 3.125 MG PO TABS: 3.1250 mg | ORAL_TABLET | Freq: Every day | ORAL | Status: DC

## 2012-04-01 NOTE — Patient Instructions (Addendum)
Continue your current medications I will obtain your labs and records Welcome to the practice! F/U in 2 months

## 2012-04-02 ENCOUNTER — Encounter: Payer: Self-pay | Admitting: Family Medicine

## 2012-04-02 DIAGNOSIS — G2581 Restless legs syndrome: Secondary | ICD-10-CM | POA: Insufficient documentation

## 2012-04-02 DIAGNOSIS — E785 Hyperlipidemia, unspecified: Secondary | ICD-10-CM | POA: Insufficient documentation

## 2012-04-02 DIAGNOSIS — I10 Essential (primary) hypertension: Secondary | ICD-10-CM | POA: Insufficient documentation

## 2012-04-02 DIAGNOSIS — R1310 Dysphagia, unspecified: Secondary | ICD-10-CM | POA: Insufficient documentation

## 2012-04-02 NOTE — Assessment & Plan Note (Signed)
Blood pressure at goal, no change to meds, continue ASA

## 2012-04-02 NOTE — Assessment & Plan Note (Signed)
Chronic problem for patient, will obtain records

## 2012-04-02 NOTE — Progress Notes (Signed)
  Subjective:    Patient ID: Lindsey Mullins, female    DOB: Jun 15, 1946, 66 y.o.   MRN: [id number]  HPI Pt here to establish care, previous PCP Dr.Hill, states she was dismissed for being a "problem patient" as she asked for her mothers meds monthly and confronted staff about it. GYN-Dr. Brandy Hale- no recent evaluation no recent mammogram Dr. Allyson Sabal- cardiologist  HTN- has seen cardiology in the past secondary to elevated blood pressure, carotid stenosis/ s/p intervention and some chest pain. BP well controlled on coreg Hyperlipidemia- she does not tolerate a lot of medications, has difficulty swallowing , was recently tried on Welchol but pt states it makes foods too thick for her to drink. She has had her swallowing evaluated with EGD at [facility] with no reason found RLS- started on requip but this caused HA and dizziness therefore she stopped  Worked at [employer], now substitute teacher   Review of Systems   GEN- denies fatigue, fever, weight loss,weakness, recent illness HEENT- denies eye drainage, change in vision, nasal discharge, CVS- denies chest pain, palpitations RESP- denies SOB, cough, wheeze ABD- denies N/V, change in stools, abd pain GU- denies dysuria, hematuria, dribbling, incontinence MSK- denies joint pain, muscle aches, injury Neuro- denies headache, dizziness, syncope, seizure activity      Objective:   Physical Exam GEN- NAD, alert and oriented x3 HEENT- PERRL, EOMI, non injected sclera, pink conjunctiva, MMM, oropharynx clear Neck- Supple, no thryomegaly CVS- RRR, no murmur RESP-CTAB ABD-NABS,soft, NT,ND EXT- No edema Pulses- Radial, DP- 2+        Assessment & Plan:

## 2012-04-02 NOTE — Assessment & Plan Note (Signed)
Pt has not tolerated meds, I will obtain her most recent labs, continue fish oil

## 2012-04-02 NOTE — Assessment & Plan Note (Signed)
Unable to tolerate requip

## 2012-04-18 ENCOUNTER — Encounter: Payer: Self-pay | Admitting: Family Medicine

## 2012-04-18 ENCOUNTER — Ambulatory Visit (INDEPENDENT_AMBULATORY_CARE_PROVIDER_SITE_OTHER): Payer: Medicare Other | Admitting: Family Medicine

## 2012-04-18 VITALS — BP 130/84 | HR 74 | Resp 16 | Ht 66.5 in | Wt 185.0 lb

## 2012-04-18 DIAGNOSIS — G2581 Restless legs syndrome: Secondary | ICD-10-CM

## 2012-04-18 DIAGNOSIS — M549 Dorsalgia, unspecified: Secondary | ICD-10-CM

## 2012-04-18 DIAGNOSIS — M5441 Lumbago with sciatica, right side: Secondary | ICD-10-CM | POA: Insufficient documentation

## 2012-04-18 MED ORDER — GABAPENTIN 300 MG PO CAPS: 300.0000 mg | ORAL_CAPSULE | Freq: Every day | ORAL | Status: DC

## 2012-04-18 NOTE — Progress Notes (Signed)
  Subjective:    Patient ID: Lindsey Mullins, female    DOB: 1946/01/16, 66 y.o.   MRN: [id number]  HPI Patient presents with bilateral leg pain. She has history of bilateral leg pain worse at night when she is resting. She is diagnoses her restless leg syndrome. She's been tried on medications before but had side effects. Last night pain was so severe she always with the ER. She was worried about blood clot. She denies any leg swelling. She does have back pain that radiates down. Has known degenerative disc disease. No change in bowel or bladder. She also admits to heartburn which she uses TUMS as needed   Review of Systems  GEN- denies fatigue, fever, weight loss,weakness, recent illness HEENT- denies eye drainage, change in vision, nasal discharge, CVS- denies chest pain, palpitations RESP- denies SOB, cough, wheeze ABD- denies N/V, change in stools, abd pain GU- denies dysuria, hematuria, dribbling, incontinence MSK- + joint pain,+ muscle aches, injury Neuro- denies headache, dizziness, syncope, seizure activity      Objective:   Physical Exam GEN- NAD, alert and oriented x3 CVS- RRR, no murmur RESP-CTAB ABD-NABS,soft, NT,ND EXT- No edema Back- Neg SLR, spine non tender, pain with flexion HIP- normal IR/ER Neuro- no focal deficits, DTR symmetric Pulses- Radial, DP- 2+      Assessment & Plan:

## 2012-04-18 NOTE — Patient Instructions (Signed)
Start the neurontin at bedtime  Continue other meds Use the TUMS for acid reflux  F/U as previous

## 2012-04-18 NOTE — Assessment & Plan Note (Signed)
She does have back pain with radiculopathy she's had an x-ray back in 2011 which shows facet arthritis. At this time we will try the Neurontin. If she is not much improved and I will obtain an MRI of the back.

## 2012-04-18 NOTE — Assessment & Plan Note (Signed)
I think restless legs is the main problem with her pain at nighttime. I will try her on low-dose Neurontin. She does not want anything for actual pain because of possible addiction. She was not able to tolerate the Requip.

## 2012-05-23 DIAGNOSIS — R0789 Other chest pain: Secondary | ICD-10-CM

## 2012-05-23 HISTORY — DX: Other chest pain: R07.89

## 2012-06-03 ENCOUNTER — Ambulatory Visit (INDEPENDENT_AMBULATORY_CARE_PROVIDER_SITE_OTHER): Payer: Medicare Other | Admitting: Family Medicine

## 2012-06-03 ENCOUNTER — Encounter: Payer: Self-pay | Admitting: Family Medicine

## 2012-06-03 VITALS — BP 126/70 | HR 62 | Resp 18 | Ht 66.5 in | Wt 185.1 lb

## 2012-06-03 DIAGNOSIS — E663 Overweight: Secondary | ICD-10-CM

## 2012-06-03 DIAGNOSIS — E785 Hyperlipidemia, unspecified: Secondary | ICD-10-CM

## 2012-06-03 DIAGNOSIS — G2581 Restless legs syndrome: Secondary | ICD-10-CM

## 2012-06-03 DIAGNOSIS — I10 Essential (primary) hypertension: Secondary | ICD-10-CM

## 2012-06-03 NOTE — Patient Instructions (Addendum)
Continue current medications Labs to be done fasting - we will call about results Call the doctor in [place] about Mammogram F/U 3 months

## 2012-06-03 NOTE — Progress Notes (Signed)
  Subjective:    Patient ID: Lindsey Mullins, female    DOB: [birth year], 66 y.o.   MRN: [id number]  HPI Pt here to f/u chronic medical problems, seen by cardiology stress test done, she is awaiting results. Due for FLP No difficulties with blood pressure medication Leg pain much improved, she did not have to start neurontin She is due for mammogram she states she will set this up in [place]-she wants this done no specific She declines colonoscopy at this time because she became very ill after taking a pill that was evidently prescribed for bowel cleanout   Review of Systems  GEN- denies fatigue, fever, weight loss,weakness, recent illness HEENT- denies eye drainage, change in vision, nasal discharge, CVS- denies chest pain, palpitations RESP- denies SOB, cough, wheeze ABD- denies N/V, change in stools, abd pain GU- denies dysuria, hematuria, dribbling, incontinence MSK- + joint pain, muscle aches, injury Neuro- denies headache, dizziness, syncope, seizure activity      Objective:   Physical Exam GEN- NAD, alert and oriented x3 HEENT- PERRL, EOMI, non injected sclera, pink conjunctiva, MMM, oropharynx clear Neck- Supple,  CVS- RRR, no murmur RESP-CTAB EXT- No edema Pulses- Radial, DP- 2+        Assessment & Plan:

## 2012-06-04 DIAGNOSIS — E663 Overweight: Secondary | ICD-10-CM | POA: Insufficient documentation

## 2012-06-04 NOTE — Assessment & Plan Note (Signed)
Plan to recheck FLP, she was on Welchol but never on statin therapy, she can be very sensitive to medications

## 2012-06-04 NOTE — Assessment & Plan Note (Signed)
Well controlled no change to meds 

## 2012-06-04 NOTE — Assessment & Plan Note (Signed)
This has improved without starting gabapentin, will monitor, she understands to start if her symptoms return

## 2012-06-06 ENCOUNTER — Other Ambulatory Visit: Payer: Self-pay | Admitting: Family Medicine

## 2012-06-06 DIAGNOSIS — Z1231 Encounter for screening mammogram for malignant neoplasm of breast: Secondary | ICD-10-CM

## 2012-06-07 LAB — COMPREHENSIVE METABOLIC PANEL
AST: 24 U/L (ref 0–37)
Albumin: 4.5 g/dL (ref 3.5–5.2)
Alkaline Phosphatase: 75 U/L (ref 39–117)
Potassium: 4.1 mEq/L (ref 3.5–5.3)
Sodium: 141 mEq/L (ref 135–145)
Total Bilirubin: 0.6 mg/dL (ref 0.3–1.2)
Total Protein: 7.6 g/dL (ref 6.0–8.3)

## 2012-06-07 LAB — CBC
MCH: 31.4 pg (ref 26.0–34.0)
MCHC: 34 g/dL (ref 30.0–36.0)
MCV: 92.4 fL (ref 78.0–100.0)
Platelets: 333 10*3/uL (ref 150–400)
RBC: 4.07 MIL/uL (ref 3.87–5.11)

## 2012-06-07 LAB — LIPID PANEL
Cholesterol: 309 mg/dL — ABNORMAL HIGH (ref 0–200)
Total CHOL/HDL Ratio: 6.7 Ratio
VLDL: 43 mg/dL — ABNORMAL HIGH (ref 0–40)

## 2012-06-07 LAB — TSH: TSH: 1.989 u[IU]/mL (ref 0.350–4.500)

## 2012-06-09 ENCOUNTER — Ambulatory Visit: Payer: Medicare Other

## 2012-06-09 ENCOUNTER — Telehealth: Payer: Self-pay | Admitting: Family Medicine

## 2012-06-09 MED ORDER — SIMVASTATIN 20 MG PO TABS: 20.0000 mg | ORAL_TABLET | Freq: Every evening | ORAL | Status: DC

## 2012-06-09 NOTE — Telephone Encounter (Signed)
Given results, start zocor 20mg  , pt very sensitive to medications

## 2012-06-10 ENCOUNTER — Ambulatory Visit
Admission: RE | Admit: 2012-06-10 | Discharge: 2012-06-10 | Disposition: A | Discharge status: Home / Self Care | Payer: Medicare Other | Source: Ambulatory Visit | Attending: Family Medicine | Admitting: Family Medicine

## 2012-06-10 DIAGNOSIS — Z1231 Encounter for screening mammogram for malignant neoplasm of breast: Secondary | ICD-10-CM

## 2012-09-04 ENCOUNTER — Ambulatory Visit: Payer: Medicare Other | Admitting: Family Medicine

## 2012-09-09 ENCOUNTER — Encounter: Payer: Self-pay | Admitting: Family Medicine

## 2012-09-19 ENCOUNTER — Ambulatory Visit (INDEPENDENT_AMBULATORY_CARE_PROVIDER_SITE_OTHER): Payer: Medicare Other | Admitting: Family Medicine

## 2012-09-19 ENCOUNTER — Encounter: Payer: Self-pay | Admitting: Family Medicine

## 2012-09-19 VITALS — BP 122/74 | HR 66 | Resp 15 | Ht 66.5 in | Wt 184.0 lb

## 2012-09-19 DIAGNOSIS — M21612 Bunion of left foot: Secondary | ICD-10-CM

## 2012-09-19 DIAGNOSIS — E785 Hyperlipidemia, unspecified: Secondary | ICD-10-CM

## 2012-09-19 DIAGNOSIS — I10 Essential (primary) hypertension: Secondary | ICD-10-CM

## 2012-09-19 DIAGNOSIS — G2581 Restless legs syndrome: Secondary | ICD-10-CM

## 2012-09-19 DIAGNOSIS — M21619 Bunion of unspecified foot: Secondary | ICD-10-CM

## 2012-09-19 MED ORDER — SIMVASTATIN 20 MG PO TABS: 20.0000 mg | ORAL_TABLET | Freq: Every evening | ORAL | Status: DC

## 2012-09-19 NOTE — Patient Instructions (Addendum)
Try the gabapentin at bedtime Continue all other medications Get the cholesterol done  Referral to foot doctor F/U 4 months

## 2012-09-21 ENCOUNTER — Encounter: Payer: Self-pay | Admitting: Family Medicine

## 2012-09-21 DIAGNOSIS — M21612 Bunion of left foot: Secondary | ICD-10-CM | POA: Insufficient documentation

## 2012-09-21 NOTE — Assessment & Plan Note (Signed)
Well controlled 

## 2012-09-21 NOTE — Assessment & Plan Note (Signed)
Recheck FLP on statin Has f/u with [facility] for carotids

## 2012-09-21 NOTE — Assessment & Plan Note (Signed)
Advised to start gabapentin

## 2012-09-21 NOTE — Assessment & Plan Note (Signed)
Refer to podiatry

## 2012-09-21 NOTE — Progress Notes (Signed)
  Subjective:    Patient ID: Lindsey Mullins, female    DOB: 1946/06/24, 66 y.o.   MRN: [id number]  HPI PT here to f/u chronic medical problems. Doing well, legs have been acting up worse at night has not tried the gabapentin. Bunion on left foot causing a lot of pain when walking, wants new foot doctor Needs cholesterol rechecked.    Review of Systems  GEN- denies fatigue, fever, weight loss,weakness, recent illness HEENT- denies eye drainage, change in vision, nasal discharge, CVS- denies chest pain, palpitations RESP- denies SOB, cough, wheeze ABD- denies N/V, change in stools, abd pain GU- denies dysuria, hematuria, dribbling, incontinence MSK- denies joint pain, +muscle aches, injury Neuro- denies headache, dizziness, syncope, seizure activity      Objective:   Physical Exam GEN- NAD, alert and oriented x3 HEENT- PERRL, EOMI, non injected sclera, pink conjunctiva, MMM, oropharynx clear Neck- Supple,  CVS- RRR, no murmur RESP-CTAB EXT- No edema, bilateral bunions L>R, TTP over bunion region Pulses- Radial, DP- 2+       Assessment & Plan:

## 2012-09-29 DIAGNOSIS — I672 Cerebral atherosclerosis: Secondary | ICD-10-CM

## 2012-09-29 HISTORY — DX: Cerebral atherosclerosis: I67.2

## 2012-09-30 LAB — LIPID PANEL
LDL Cholesterol: 107 mg/dL — ABNORMAL HIGH (ref 0–99)
Triglycerides: 167 mg/dL — ABNORMAL HIGH (ref <150)

## 2012-10-30 ENCOUNTER — Other Ambulatory Visit: Payer: Self-pay | Admitting: Family Medicine

## 2012-10-30 MED ORDER — CYCLOBENZAPRINE HCL 5 MG PO TABS: 5.0000 mg | ORAL_TABLET | Freq: Every evening | ORAL | Status: DC | PRN

## 2012-10-30 NOTE — Progress Notes (Signed)
Pt came in with mother neurontin has not helped she would like to stop it makes her drowsy and she does not want to take any meds during the day Her leg spasms and cramps are worse She has not had a lot of rest Trial of Flexeril for sleep and cramps D/C neurontin

## 2012-12-04 ENCOUNTER — Telehealth: Payer: Self-pay | Admitting: Family Medicine

## 2012-12-04 ENCOUNTER — Other Ambulatory Visit: Payer: Self-pay

## 2012-12-04 MED ORDER — CARVEDILOL 3.125 MG PO TABS: 3.1250 mg | ORAL_TABLET | Freq: Every day | ORAL | Status: DC

## 2012-12-04 NOTE — Telephone Encounter (Signed)
Refill sent.

## 2013-01-06 ENCOUNTER — Other Ambulatory Visit: Payer: Self-pay | Admitting: Family Medicine

## 2013-01-12 ENCOUNTER — Ambulatory Visit: Payer: Medicare Other | Admitting: Family Medicine

## 2013-01-19 ENCOUNTER — Ambulatory Visit: Payer: Medicare Other | Admitting: Family Medicine

## 2013-01-23 ENCOUNTER — Ambulatory Visit: Payer: Medicare Other | Admitting: Family Medicine

## 2013-01-27 ENCOUNTER — Ambulatory Visit: Payer: Medicare Other | Admitting: Family Medicine

## 2013-01-28 ENCOUNTER — Encounter: Payer: Self-pay | Admitting: Family Medicine

## 2013-02-16 ENCOUNTER — Other Ambulatory Visit: Payer: Self-pay | Admitting: Family Medicine

## 2013-04-17 ENCOUNTER — Encounter: Payer: Self-pay | Admitting: Cardiovascular Disease

## 2013-04-20 ENCOUNTER — Encounter: Payer: Self-pay | Admitting: Cardiovascular Disease

## 2013-04-20 ENCOUNTER — Ambulatory Visit (INDEPENDENT_AMBULATORY_CARE_PROVIDER_SITE_OTHER): Payer: Medicare Other | Admitting: Cardiovascular Disease

## 2013-04-20 VITALS — BP 128/82 | HR 71 | Ht 66.5 in | Wt 181.0 lb

## 2013-04-20 DIAGNOSIS — I739 Peripheral vascular disease, unspecified: Secondary | ICD-10-CM

## 2013-04-20 DIAGNOSIS — I10 Essential (primary) hypertension: Secondary | ICD-10-CM

## 2013-04-20 DIAGNOSIS — E785 Hyperlipidemia, unspecified: Secondary | ICD-10-CM

## 2013-04-20 NOTE — Assessment & Plan Note (Signed)
On statin therapy followed by her primary care physician 

## 2013-04-20 NOTE — Patient Instructions (Addendum)
Your physician wants you to follow-up in: 12 MONTHS You will receive a reminder letter in the mail two months in advance. If you don't receive a letter, please call our office to schedule the follow-up appointment.   You will be scheduled for carotid doppler in the next couple of weeks

## 2013-04-20 NOTE — Assessment & Plan Note (Signed)
Well-controlled on current medications 

## 2013-04-20 NOTE — Progress Notes (Signed)
04/20/2013 Lindsey Mullins   1945/11/29  [id number]  Primary Physician Milinda Antis, MD Primary Cardiologist: Runell Gess MD FACP,FACC,FAHA, FSCAI   HPI:  .The patient is a 67 year old, mildly overweight, divorced, African American female, mother of two, grandmother to one grandchild, who was seen by Corine Shelter on May 06, 2012. The last time I saw her in the office was August 09, 2010. She has a history of hypertension, hyperlipidemia, and vascular disease. I performed an angiogram on her on July 14, 2005, revealing a high grade left internal carotid artery stenosis. She underwent elective left carotid endarterectomy by Dr. Liliane Bade on August 09, 2005, with followup Dopplers done in our office as recently as last October showing a widely patent endarterectomy site with moderate right ICA stenosis. She remains neurologically asymptomatic. She was seen in the office by Corine Shelter with complaints of chest burning. She also has a history of GERD. She has had multiple negative Myoviews in the past and a Lexiscan Myoview performed on May 23, 2012, was negative as well. Her most recent lipid profile performed August 08, 2012, revealed a total cholesterol of 309, LDL of 220, HDL of 46.since I saw her a year ago she denies chest pain or shortness of breath      Current Outpatient Prescriptions  Medication Sig Dispense Refill  . aspirin 81 MG tablet Take 81 mg by mouth daily.      . calcium carbonate (OS-CAL) 1250 MG chewable tablet Chew 1 tablet by mouth daily.      . carvedilol (COREG) 3.125 MG tablet Take 1 tablet (3.125 mg total) by mouth daily.  90 tablet  1  . cyclobenzaprine (FLEXERIL) 5 MG tablet TAKE ONE TABLET BY MOUTH AT BEDTIME AS NEEDED FOR MUSCLE SPASM AND FOR LEG CRAMP/SPASM  30 tablet  3  . nitroGLYCERIN (NITROSTAT) 0.4 MG SL tablet Place 0.4 mg under the tongue every 5 (five) minutes as needed.      . Omega-3 Krill Oil 500 MG CAPS Take 1 capsule by mouth daily.       [name] omeprazole (PRILOSEC) 20 MG capsule Take 20 mg by mouth daily.      . simvastatin (ZOCOR) 20 MG tablet TAKE ONE TABLET BY MOUTH EVERY EVENING  30 tablet  4  . gabapentin (NEURONTIN) 300 MG capsule Take 1 capsule (300 mg total) by mouth at bedtime.  30 capsule  1   No current facility-administered medications for this visit.    Allergies  Allergen Reactions  . Codeine   . Lipitor (Atorvastatin) Other (See Comments)    Myalgias  . Lisinopril Cough  . Penicillins   . Pravastatin Other (See Comments)    Myalgias    History   Social History  . Marital Status: Divorced    Spouse Name: N/A    Number of Children: N/A  . Years of Education: N/A   Occupational History  . Not on file.   Social History Main Topics  . Smoking status: Former Smoker    Quit date: 04/21/1983  . Smokeless tobacco: Not on file  . Alcohol Use: Not on file  . Drug Use: Not on file  . Sexually Active: Not on file   Other Topics Concern  . Not on file   Social History Narrative  . No narrative on file     Review of Systems: General: negative for chills, fever, night sweats or weight changes.  Cardiovascular: negative for chest pain, dyspnea on exertion, edema, orthopnea, palpitations,  paroxysmal nocturnal dyspnea or shortness of breath Dermatological: negative for rash Respiratory: negative for cough or wheezing Urologic: negative for hematuria Abdominal: negative for nausea, vomiting, diarrhea, bright red blood per rectum, melena, or hematemesis Neurologic: negative for visual changes, syncope, or dizziness All other systems reviewed and are otherwise negative except as noted above.    Blood pressure 128/82, pulse 71, height 5' 6.5" (1.689 m), weight 181 lb (82.101 kg).  General appearance: alert and no distress Neck: no adenopathy, no carotid bruit, no JVD, supple, symmetrical, trachea midline and thyroid not enlarged, symmetric, no tenderness/mass/nodules Lungs: clear to auscultation  bilaterally Heart: regular rate and rhythm, S1, S2 normal, no murmur, click, rub or gallop Extremities: extremities normal, atraumatic, no cyanosis or edema  EKG normal sinus rhythm at 71 without ST or T wave changes  ASSESSMENT AND PLAN:   Carotid artery disease Patient is neurologically asymptomatic on low-dose aspirin. We'll continue Doppler followup  Essential hypertension, benign Well-controlled on current medications  Hyperlipidemia On statin therapy followed by her primary care physician      Runell Gess MD [facility], [facility] 04/20/2013 10:37 AM

## 2013-04-20 NOTE — Assessment & Plan Note (Signed)
Patient is neurologically asymptomatic on low-dose aspirin. We'll continue Doppler followup

## 2013-04-23 ENCOUNTER — Other Ambulatory Visit: Payer: Self-pay | Admitting: Family Medicine

## 2013-04-23 MED ORDER — CARVEDILOL 3.125 MG PO TABS: 3.1250 mg | ORAL_TABLET | Freq: Every day | ORAL | Status: DC

## 2013-04-23 MED ORDER — CYCLOBENZAPRINE HCL 5 MG PO TABS: ORAL_TABLET | ORAL | Status: DC

## 2013-07-06 ENCOUNTER — Encounter (HOSPITAL_COMMUNITY): Payer: Self-pay | Admitting: Emergency Medicine

## 2013-07-06 ENCOUNTER — Emergency Department (HOSPITAL_COMMUNITY): Payer: Medicare Other

## 2013-07-06 ENCOUNTER — Emergency Department (HOSPITAL_COMMUNITY)
Admission: EM | Admit: 2013-07-06 | Discharge: 2013-07-06 | Disposition: A | Discharge status: Home / Self Care | Payer: Medicare Other | Attending: Emergency Medicine | Admitting: Emergency Medicine

## 2013-07-06 DIAGNOSIS — Z7982 Long term (current) use of aspirin: Secondary | ICD-10-CM | POA: Insufficient documentation

## 2013-07-06 DIAGNOSIS — Z87891 Personal history of nicotine dependence: Secondary | ICD-10-CM | POA: Insufficient documentation

## 2013-07-06 DIAGNOSIS — K219 Gastro-esophageal reflux disease without esophagitis: Secondary | ICD-10-CM | POA: Insufficient documentation

## 2013-07-06 DIAGNOSIS — Z8679 Personal history of other diseases of the circulatory system: Secondary | ICD-10-CM | POA: Insufficient documentation

## 2013-07-06 DIAGNOSIS — Z8669 Personal history of other diseases of the nervous system and sense organs: Secondary | ICD-10-CM | POA: Insufficient documentation

## 2013-07-06 DIAGNOSIS — R109 Unspecified abdominal pain: Secondary | ICD-10-CM | POA: Insufficient documentation

## 2013-07-06 DIAGNOSIS — Z79899 Other long term (current) drug therapy: Secondary | ICD-10-CM | POA: Insufficient documentation

## 2013-07-06 DIAGNOSIS — Z8673 Personal history of transient ischemic attack (TIA), and cerebral infarction without residual deficits: Secondary | ICD-10-CM | POA: Insufficient documentation

## 2013-07-06 DIAGNOSIS — I10 Essential (primary) hypertension: Secondary | ICD-10-CM | POA: Insufficient documentation

## 2013-07-06 DIAGNOSIS — Z880 Allergy status to penicillin: Secondary | ICD-10-CM | POA: Insufficient documentation

## 2013-07-06 DIAGNOSIS — E785 Hyperlipidemia, unspecified: Secondary | ICD-10-CM | POA: Insufficient documentation

## 2013-07-06 LAB — URINALYSIS, ROUTINE W REFLEX MICROSCOPIC
Glucose, UA: NEGATIVE mg/dL
Hgb urine dipstick: NEGATIVE
Specific Gravity, Urine: 1.02 (ref 1.005–1.030)
pH: 6 (ref 5.0–8.0)

## 2013-07-06 LAB — COMPREHENSIVE METABOLIC PANEL
Alkaline Phosphatase: 95 U/L (ref 39–117)
BUN: 13 mg/dL (ref 6–23)
CO2: 24 mEq/L (ref 19–32)
Chloride: 103 mEq/L (ref 96–112)
GFR calc Af Amer: 86 mL/min — ABNORMAL LOW (ref >90)
Glucose, Bld: 99 mg/dL (ref 70–99)
Potassium: 3.5 mEq/L (ref 3.5–5.1)
Total Bilirubin: 0.6 mg/dL (ref 0.3–1.2)

## 2013-07-06 LAB — CBC WITH DIFFERENTIAL/PLATELET
Basophils Relative: 1 % (ref 0–1)
HCT: 41 % (ref 36.0–46.0)
Hemoglobin: 13.7 g/dL (ref 12.0–15.0)
Lymphocytes Relative: 33 % (ref 12–46)
MCHC: 33.4 g/dL (ref 30.0–36.0)
Monocytes Absolute: 0.8 10*3/uL (ref 0.1–1.0)
Monocytes Relative: 9 % (ref 3–12)
Neutro Abs: 5.1 10*3/uL (ref 1.7–7.7)

## 2013-07-06 LAB — LIPASE, BLOOD: Lipase: 65 U/L — ABNORMAL HIGH (ref 11–59)

## 2013-07-06 MED ORDER — DOCUSATE SODIUM 100 MG PO CAPS: 100.0000 mg | ORAL_CAPSULE | Freq: Two times a day (BID) | ORAL | Status: DC

## 2013-07-06 MED ORDER — OXYCODONE-ACETAMINOPHEN 5-325 MG PO TABS
2.0000 | ORAL_TABLET | Freq: Once | ORAL | Status: AC
  Administered 2013-07-06: 1 via ORAL
  Filled 2013-07-06: qty 2

## 2013-07-06 MED ORDER — TRAMADOL HCL 50 MG PO TABS: 50.0000 mg | ORAL_TABLET | Freq: Four times a day (QID) | ORAL | Status: DC | PRN

## 2013-07-06 NOTE — ED Provider Notes (Signed)
CSN: [id number]     Arrival date & time 07/06/13  0348 History     First MD Initiated Contact with Patient 07/06/13 0414     Chief Complaint  Patient presents with  . Abdominal Pain   (Consider location/radiation/quality/duration/timing/severity/associated sxs/prior Treatment) HPI Comments: Pt is a 67 y/o female who states that she has several episodes a year where she has severe abdominal pain which occurs acutely, then is completely relieved by having a BM - this occurred this evening - awoke her from sleep is waxing and waning in intensity and is located in the LUQ and LLQ.  She has had no diarrhea, no dysuria, no f/c/n/v and no back pain.  Nothing makes it better, worse with palpation of the abdomen.  States yesterday was a normal day - normal meals.  Has had exploratory surgery in the past for same pain - no findings - 2001.  Patient is a 66 y.o. female presenting with abdominal pain. The history is provided by the patient and medical records.  Abdominal Pain Associated symptoms: no chest pain, no chills, no cough, no diarrhea, no dysuria, no fever, no nausea, no shortness of breath, no sore throat and no vomiting     Past Medical History  Diagnosis Date  . Hypertension   . Hyperlipidemia   . Restless leg syndrome   . GERD (gastroesophageal reflux disease)   . Cerebral atherosclerosis 09/29/2012    CAROTID DUPLEX - RIGHT  BULB/PROXIMAL ICA-moderate amount of fibrous plaque, 50-69% diameter reduction; LEFT CEA-normal, no significant diameter reduction  . Atypical chest pain 05/23/2012    STRESS TEST - small to moderate sized area of partial reversibility of the anteroapical wall, most likely breast artifact, post-stress EF 67%, EKG show NSR at 65, No Lexiscan EKG changes, non-diagnostic for ischemia; STRESS TEST, 01/25/2010 - normal study, post-stress EF 66%, no significant ischemia  . TIA (transient ischemic attack) 01/27/2010    2D ECHO - EF 65%, normal  . Shortness of breath 03/30/2005     2D ECHO - EF >55%, normal   Past Surgical History  Procedure Laterality Date  . Abdominal exploration surgery    . Carotid endarterectomy    . Peripheral vascular catheterization  07/16/2005    Right verterbral-50% smooth segmental badsilar artery stenosis on right side, Right carotid-50% ulcerated proxmial stenosis, Left carotid-60 to 70% left externam carotid artery stenosis, 90% proximal left ICA stenosis   Family History  Problem Relation Age of Onset  . Heart disease Mother   . Hypertension Mother   . Hypertension Father   . Heart disease Father    History  Substance Use Topics  . Smoking status: Former Smoker    Quit date: 04/21/1983  . Smokeless tobacco: Not on file  . Alcohol Use: No   OB History   Grav Para Term Preterm Abortions TAB SAB Ect Mult Living                 Review of Systems  Constitutional: Negative for fever and chills.  HENT: Negative for sore throat and neck pain.   Eyes: Negative for visual disturbance.  Respiratory: Negative for cough and shortness of breath.   Cardiovascular: Negative for chest pain.  Gastrointestinal: Positive for abdominal pain. Negative for nausea, vomiting and diarrhea.  Genitourinary: Negative for dysuria and frequency.  Musculoskeletal: Negative for back pain.  Skin: Negative for rash.  Neurological: Negative for weakness, numbness and headaches.  Hematological: Negative for adenopathy.  Psychiatric/Behavioral: Negative for behavioral problems.  Allergies  Codeine; Lipitor; Lisinopril; Penicillins; and Pravastatin  Home Medications   Current Outpatient Rx  Name  Route  Sig  Dispense  Refill  . aspirin 81 MG tablet   Oral   Take 81 mg by mouth daily.         . calcium carbonate (OS-CAL) 1250 MG chewable tablet   Oral   Chew 1 tablet by mouth daily.         . carvedilol (COREG) 3.125 MG tablet   Oral   Take 1 tablet (3.125 mg total) by mouth daily.   90 tablet   1   . cyclobenzaprine (FLEXERIL) 5  MG tablet      TAKE ONE TABLET BY MOUTH AT BEDTIME AS NEEDED FOR MUSCLE SPASM AND FOR LEG CRAMP/SPASM   30 tablet   3   . docusate sodium (COLACE) 100 MG capsule   Oral   Take 1 capsule (100 mg total) by mouth every 12 (twelve) hours.   30 capsule   0   . EXPIRED: gabapentin (NEURONTIN) 300 MG capsule   Oral   Take 1 capsule (300 mg total) by mouth at bedtime.   30 capsule   1   . nitroGLYCERIN (NITROSTAT) 0.4 MG SL tablet   Sublingual   Place 0.4 mg under the tongue every 5 (five) minutes as needed.         . Omega-3 Krill Oil 500 MG CAPS   Oral   Take 1 capsule by mouth daily.         [name] omeprazole (PRILOSEC) 20 MG capsule   Oral   Take 20 mg by mouth daily.         . simvastatin (ZOCOR) 20 MG tablet      TAKE ONE TABLET BY MOUTH EVERY EVENING   30 tablet   4   . traMADol (ULTRAM) 50 MG tablet   Oral   Take 1 tablet (50 mg total) by mouth every 6 (six) hours as needed for pain.   15 tablet   0    BP 129/73  Pulse 67  Resp 18  SpO2 100% Physical Exam  Nursing note and vitals reviewed. Constitutional: She appears well-developed and well-nourished. No distress.  HENT:  Head: Normocephalic and atraumatic.  Mouth/Throat: Oropharynx is clear and moist. No oropharyngeal exudate.  Eyes: Conjunctivae and EOM are normal. Pupils are equal, round, and reactive to light. Right eye exhibits no discharge. Left eye exhibits no discharge. No scleral icterus.  Neck: Normal range of motion. Neck supple. No JVD present. No thyromegaly present.  Cardiovascular: Normal rate, regular rhythm, normal heart sounds and intact distal pulses.  Exam reveals no gallop and no friction rub.   No murmur heard. Pulmonary/Chest: Effort normal and breath sounds normal. No respiratory distress. She has no wheezes. She has no rales.  Abdominal: Soft. Bowel sounds are normal. She exhibits no distension and no mass. There is tenderness ( mild SP ttp, moderate LLQ and LUQ ttp, normal BS,  normal appearing abd, no peitoneal signs. no rigidity).  Musculoskeletal: Normal range of motion. She exhibits no edema and no tenderness.  Lymphadenopathy:    She has no cervical adenopathy.  Neurological: She is alert. Coordination normal.  Skin: Skin is warm and dry. No rash noted. No erythema.  Psychiatric: She has a normal mood and affect. Her behavior is normal.    ED Course   Procedures (including critical care time)  Labs Reviewed  URINALYSIS, ROUTINE W REFLEX MICROSCOPIC -  Abnormal; Notable for the following:    Bilirubin Urine SMALL (*)    All other components within normal limits  COMPREHENSIVE METABOLIC PANEL - Abnormal; Notable for the following:    Total Protein 8.4 (*)    GFR calc non Af Amer 74 (*)    GFR calc Af Amer 86 (*)    All other components within normal limits  LIPASE, BLOOD - Abnormal; Notable for the following:    Lipase 65 (*)    All other components within normal limits  CBC WITH DIFFERENTIAL   Dg Abd Acute W/chest  07/06/2013   *RADIOLOGY REPORT*  Clinical Data: Left upper quadrant abdominal pain, dizziness, nausea, and vomiting.  ACUTE ABDOMEN SERIES (ABDOMEN 2 VIEW & CHEST 1 VIEW)  Comparison: 11/27/2010  Findings: Vague increased density over the lung bases is probably due to soft tissue attenuation.  No definite consolidation in the lungs.  No blunting of costophrenic angles.  No pneumothorax. Normal heart size and pulmonary vascularity.  Scattered gas and stool in the colon.  No small or large bowel distension.  No free intra-abdominal air.  No abnormal air fluid levels.  No radiopaque stones.  Calcifications in the pelvis likely represent phleboliths.  Degenerative changes in the spine and hips.  IMPRESSION: No evidence of active pulmonary disease.  Nonobstructive bowel gas pattern.   Original Report Authenticated By: Burman Nieves, M.D.   1. Abdominal pain     MDM  The pt states that she gets diaphoretic when this pain comes on but that it  totally goes away with BM - she has had BM prior to arrival that started hard and ended soft - she has no blood in stools, andsx are gradually improving.  VS are normal on arrival - check AAS to r/o obstruction though pt has no distention, no n/v.  Labs.  Doubt diverticulitis given pt's acute symptoms.  Could be KS - check UA.  Pt states that her pain is almost gone - labs unremarkable, no leukocytosis, no renal or liver abdnormlaities and normal xray.  She has minimal if any ttp on exam at this time, VS remain normal without fevers, tachycardia or hypotension.  Pt informed of her results and encouraged to f/u closely.  Meds given in ED:  Medications  oxyCODONE-acetaminophen (PERCOCET/ROXICET) 5-325 MG per tablet 2 tablet (1 tablet Oral Given 07/06/13 0427)    New Prescriptions   DOCUSATE SODIUM (COLACE) 100 MG CAPSULE    Take 1 capsule (100 mg total) by mouth every 12 (twelve) hours.   TRAMADOL (ULTRAM) 50 MG TABLET    Take 1 tablet (50 mg total) by mouth every 6 (six) hours as needed for pain.      Vida Roller, MD 07/06/13 [phone]

## 2013-07-06 NOTE — ED Notes (Addendum)
Per pt, abd pain awakened pt from sleep about 30 min ago. Pt states having severe abd pain and need to have BM; when on toilet pt states that she get hot,sweaty, and dizzy. Pt reports have large BM with abd pain. Pt denies n/v/d or LOC. Pt abd is soft with audible bowel sounds but tender to touch in LUQ.

## 2013-07-06 NOTE — ED Notes (Addendum)
Per EMS, pt came from home with abd pain starting 30 mins ago that awakened pt from pain. Pt complains of pain in LUQ. Pt states that she becomes dizzy when on the toilet.

## 2013-07-10 ENCOUNTER — Ambulatory Visit: Payer: Self-pay | Admitting: Family Medicine

## 2013-07-14 ENCOUNTER — Ambulatory Visit: Payer: Self-pay | Admitting: Family Medicine

## 2013-08-10 ENCOUNTER — Telehealth: Payer: Self-pay | Admitting: Family Medicine

## 2013-08-10 MED ORDER — SIMVASTATIN 20 MG PO TABS: ORAL_TABLET | ORAL | Status: DC

## 2013-08-10 NOTE — Telephone Encounter (Signed)
Simvastatin 20 mg tab 1 QPM #30

## 2013-08-10 NOTE — Telephone Encounter (Signed)
Meds refilled.

## 2013-08-21 ENCOUNTER — Ambulatory Visit (INDEPENDENT_AMBULATORY_CARE_PROVIDER_SITE_OTHER): Payer: Medicare Other | Admitting: Family Medicine

## 2013-08-21 VITALS — BP 120/70 | HR 80 | Temp 97.3°F | Resp 18 | Ht 64.5 in | Wt 173.0 lb

## 2013-08-21 DIAGNOSIS — G2581 Restless legs syndrome: Secondary | ICD-10-CM

## 2013-08-21 DIAGNOSIS — Z111 Encounter for screening for respiratory tuberculosis: Secondary | ICD-10-CM

## 2013-08-21 DIAGNOSIS — Z0000 Encounter for general adult medical examination without abnormal findings: Secondary | ICD-10-CM

## 2013-08-21 DIAGNOSIS — E785 Hyperlipidemia, unspecified: Secondary | ICD-10-CM

## 2013-08-21 DIAGNOSIS — Z23 Encounter for immunization: Secondary | ICD-10-CM

## 2013-08-21 DIAGNOSIS — I10 Essential (primary) hypertension: Secondary | ICD-10-CM

## 2013-08-21 LAB — COMPREHENSIVE METABOLIC PANEL
ALT: 9 U/L (ref 0–35)
AST: 23 U/L (ref 0–37)
BUN: 7 mg/dL (ref 6–23)
Creat: 0.84 mg/dL (ref 0.50–1.10)
Total Bilirubin: 0.7 mg/dL (ref 0.3–1.2)

## 2013-08-21 LAB — LIPID PANEL
Cholesterol: 169 mg/dL (ref 0–200)
HDL: 41 mg/dL (ref >39)
Total CHOL/HDL Ratio: 4.1 Ratio
VLDL: 25 mg/dL (ref 0–40)

## 2013-08-21 NOTE — Progress Notes (Signed)
Subjective:    Patient ID: Lindsey Mullins, female    DOB: 09/22/1946, 67 y.o.   MRN: [id number]  HPI Subjective:   Patient presents for Medicare Annual/Subsequent preventive examination.   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 Needs PPD placed, TDAP, Pneuomax Medications also reviewed  Hyperlipidemia- due for FLP, tolerating medications  Review Past Medical/Family/Social: Reviewed    Risk Factors  Current exercise habits: Walks some Dietary issues discussed: yes  Cardiac risk factors: HTN, Hyperlipidemia.   Depression Screen  (Note: if answer to either of the following is "Yes", a more complete depression screening is indicated)  Over the past two weeks, have you felt down, depressed or hopeless? No Over the past two weeks, have you felt little interest or pleasure in doing things? No Have you lost interest or pleasure in daily life? No Do you often feel hopeless? No Do you cry easily over simple problems? No   Activities of Daily Living  In your present state of health, do you have any difficulty performing the following activities?:  Driving? No  Managing money? No  Feeding yourself? No  Getting from bed to chair? No  Climbing a flight of stairs? No  Preparing food and eating?: No  Bathing or showering? No  Getting dressed: No  Getting to the toilet? No  Using the toilet:No  Moving around from place to place: No  In the past year have you fallen or had a near fall?:No  Are you sexually active? No  Do you have more than one partner? No   Hearing Difficulties: No  Do you often ask people to speak up or repeat themselves? No  Do you experience ringing or noises in your ears? No Do you have difficulty understanding soft or whispered voices? No  Do you feel that you have a problem with memory? No Do you often misplace items? No  Do you feel safe at home? Yes  Cognitive Testing  Alert? Yes Normal Appearance?Yes   Oriented to person? Yes Place? Yes  Time? Yes  Recall of three objects? Yes  Can perform simple calculations? Yes  Displays appropriate judgment?Yes  Can read the correct time from a watch face?Yes   List the Names of Other Physician/Practitioners you currently use: Cardiology- records on file   Screening Tests / Date Colonoscopy    - declines                 Zostavax - declines Mammogram - pt to schedule Influenza Vaccine - declines Tetanus/tdap- given today    Assessment:    Annual wellness medicare exam   Plan:    During the course of the visit the patient was educated and counseled about appropriate screening and preventive services including:  Screening mammography  Colorectal cancer screening - declines   Screen negative for depression.  Diet review for nutrition referral? Yes ____ Not Indicated __x__  Patient Instructions (the written plan) was given to the patient.  Medicare Attestation  I have personally reviewed:  The patient's medical and social history  Their use of alcohol, tobacco or illicit drugs  Their current medications and supplements  The patient's functional ability including ADLs,fall risks, home safety risks, cognitive, and hearing and visual impairment  Diet and physical activities  Evidence for depression or mood disorders  The patient's weight, height, BMI, and visual acuity have been recorded in the chart. I have made referrals, counseling, and provided education to the  patient based on review of the above and I have provided the patient with a written personalized care plan for preventive services.        Review of Systems   GEN- denies fatigue, fever, weight loss,weakness, recent illness HEENT- denies eye drainage, change in vision, nasal discharge, CVS- denies chest pain, palpitations RESP- denies SOB, cough, wheeze ABD- denies N/V, change in stools, abd pain GU- denies dysuria, hematuria, dribbling, incontinence MSK- denies joint  pain, muscle aches, injury Neuro- denies headache, dizziness, syncope, seizure activity      Objective:   Physical Exam GEN- NAD, alert and oriented x3 HEENT- PERRL, EOMI, non injected sclera, pink conjunctiva, MMM, oropharynx clear Neck- Supple, no thryomegaly CVS- RRR, no murmur RESP-CTAB ABD-NABS,soft,NT,ND EXT- No edema Pulses- Radial, DP- 2+ Neuro- CNII-XII in tact, no focal deficits MSK- Good ROM UE and LE        Assessment & Plan:

## 2013-08-21 NOTE — Patient Instructions (Addendum)
Get calcium ( 1200mg ) and Vitamin D (800IU) Continue all other medications Tetanus Booster Pneumonia shot We will call with labs F/u Monday for your TB test results F/U 6 months

## 2013-08-23 ENCOUNTER — Encounter: Payer: Self-pay | Admitting: Family Medicine

## 2013-08-23 NOTE — Assessment & Plan Note (Signed)
Well controlled reviewed last cardiology note Labs today

## 2013-08-23 NOTE — Assessment & Plan Note (Signed)
Lipid panel today

## 2013-08-23 NOTE — Assessment & Plan Note (Signed)
Gabapentin at night 

## 2013-08-24 ENCOUNTER — Ambulatory Visit: Payer: Medicare Other

## 2013-09-03 ENCOUNTER — Telehealth: Payer: Self-pay | Admitting: Family Medicine

## 2013-09-03 MED ORDER — CYCLOBENZAPRINE HCL 5 MG PO TABS: ORAL_TABLET | ORAL | Status: DC

## 2013-09-03 NOTE — Telephone Encounter (Signed)
Cyclobezaprine 5 mg  One po at HS PRN leg cramps   Last RF 09/02/13 #30   OK refill?

## 2013-09-03 NOTE — Telephone Encounter (Signed)
Medication refilled per protocol. 

## 2013-09-03 NOTE — Telephone Encounter (Signed)
Okay to refill- give 2 extra

## 2013-09-30 ENCOUNTER — Telehealth (HOSPITAL_COMMUNITY): Payer: Self-pay | Admitting: *Deleted

## 2013-11-26 ENCOUNTER — Ambulatory Visit: Payer: Medicare Other | Admitting: Physician Assistant

## 2013-11-30 ENCOUNTER — Ambulatory Visit (INDEPENDENT_AMBULATORY_CARE_PROVIDER_SITE_OTHER): Payer: Medicare Other | Admitting: Family Medicine

## 2013-11-30 ENCOUNTER — Encounter: Payer: Self-pay | Admitting: Family Medicine

## 2013-11-30 VITALS — BP 120/80 | HR 80 | Temp 97.7°F | Resp 18 | Ht 65.5 in | Wt 176.0 lb

## 2013-11-30 DIAGNOSIS — Z23 Encounter for immunization: Secondary | ICD-10-CM

## 2013-11-30 DIAGNOSIS — N3090 Cystitis, unspecified without hematuria: Secondary | ICD-10-CM

## 2013-11-30 DIAGNOSIS — R300 Dysuria: Secondary | ICD-10-CM

## 2013-11-30 LAB — URINALYSIS, ROUTINE W REFLEX MICROSCOPIC
Bilirubin Urine: NEGATIVE
GLUCOSE, UA: NEGATIVE mg/dL
KETONES UR: NEGATIVE mg/dL
LEUKOCYTES UA: NEGATIVE
NITRITE: NEGATIVE
PH: 6 (ref 5.0–8.0)
Protein, ur: NEGATIVE mg/dL
SPECIFIC GRAVITY, URINE: 1.025 (ref 1.005–1.030)
Urobilinogen, UA: 0.2 mg/dL (ref 0.0–1.0)

## 2013-11-30 LAB — URINALYSIS, MICROSCOPIC ONLY
Casts: NONE SEEN
Crystals: NONE SEEN

## 2013-11-30 NOTE — Addendum Note (Signed)
Addended by: Daylene Posey T on: 11/30/2013 10:02 AM   Modules accepted: Orders

## 2013-11-30 NOTE — Assessment & Plan Note (Signed)
Urinalysis shows trace blood presented she had a recent urinary tract infection that she is trying to clear her on. I will send her urine for culture to make sure she has no residual bacteria growing. I will then decide on his antibiotics are needed. She can continue flushing with water and cranberry juice as needed

## 2013-11-30 NOTE — Patient Instructions (Signed)
I will culture the urine and start antibiotics if needed F/U as previous

## 2013-11-30 NOTE — Progress Notes (Signed)
   Subjective:    Patient ID: Lindsey Mullins, female    DOB: 23-Feb-1946, 68 y.o.   MRN: [id number]  HPI  Patient here with complaint of dysuria. She had burning sensation pelvic pressure for possibly one week this actually resolved about 4 days ago however she was still concerned about infection. She denies any blood in the urine denies any current dysuria. Denies any change in her stools or abdominal pain. She's not had any fever. She did take cranberry juice and increase her water during the episodes which helped her symptoms She also requests flu shot  Review of Systems - per above  GEN- denies fatigue, fever, weight loss,weakness, recent illness ABD- denies N/V, change in stools, abd pain GU- + dysuria, denies hematuria, dribbling, incontinence       Objective:   Physical Exam GEN-NAD,alert and oriented x 3 ABD-NABS,soft,NT,ND, no suprapubic tenderness, no CVA tenderness Ext- No edema       Assessment & Plan:

## 2013-12-01 LAB — URINE CULTURE
Colony Count: NO GROWTH
ORGANISM ID, BACTERIA: NO GROWTH

## 2014-01-07 ENCOUNTER — Other Ambulatory Visit: Payer: Self-pay | Admitting: Family Medicine

## 2014-01-07 DIAGNOSIS — I10 Essential (primary) hypertension: Secondary | ICD-10-CM

## 2014-01-08 NOTE — Telephone Encounter (Signed)
Medication refilled per protocol. 

## 2014-01-24 ENCOUNTER — Other Ambulatory Visit: Payer: Self-pay | Admitting: Family Medicine

## 2014-01-25 NOTE — Telephone Encounter (Signed)
Refill appropriate and filled per protocol. 

## 2014-02-04 ENCOUNTER — Telehealth: Payer: Self-pay | Admitting: *Deleted

## 2014-02-04 NOTE — Telephone Encounter (Signed)
Received letter requesting PA for Flexeril.   PA submitted.

## 2014-02-05 NOTE — Telephone Encounter (Signed)
PA approved.   02/04/2014- 02/05/2015.  [id number]

## 2014-02-15 ENCOUNTER — Other Ambulatory Visit: Payer: Self-pay | Admitting: Cardiovascular Disease

## 2014-02-15 NOTE — Telephone Encounter (Signed)
Rx was sent to pharmacy electronically. 

## 2014-02-17 ENCOUNTER — Other Ambulatory Visit: Payer: Self-pay

## 2014-02-17 DIAGNOSIS — Z1231 Encounter for screening mammogram for malignant neoplasm of breast: Secondary | ICD-10-CM

## 2014-02-22 ENCOUNTER — Other Ambulatory Visit: Payer: Self-pay | Admitting: *Deleted

## 2014-02-22 MED ORDER — SIMVASTATIN 20 MG PO TABS: ORAL_TABLET | ORAL | Status: DC

## 2014-02-22 NOTE — Telephone Encounter (Signed)
Refill appropriate and filled per protocol. 

## 2014-02-25 ENCOUNTER — Ambulatory Visit
Admission: RE | Admit: 2014-02-25 | Discharge: 2014-02-25 | Disposition: A | Discharge status: Home / Self Care | Payer: Medicare Other | Source: Ambulatory Visit

## 2014-02-25 DIAGNOSIS — Z1231 Encounter for screening mammogram for malignant neoplasm of breast: Secondary | ICD-10-CM

## 2014-03-01 ENCOUNTER — Other Ambulatory Visit: Payer: Self-pay | Admitting: Family Medicine

## 2014-03-01 ENCOUNTER — Ambulatory Visit: Payer: Medicare Other

## 2014-03-02 NOTE — Telephone Encounter (Signed)
Last OV 11/30/13.  Last RF 01/25/14 #30  OK refill?

## 2014-04-15 ENCOUNTER — Other Ambulatory Visit: Payer: Self-pay | Admitting: Family Medicine

## 2014-04-16 ENCOUNTER — Encounter: Payer: Self-pay | Admitting: Family Medicine

## 2014-04-16 NOTE — Telephone Encounter (Signed)
Medication refill for one time only.  Patient needs to be seen.  Letter sent for patient to call and schedule 

## 2014-04-23 ENCOUNTER — Telehealth: Payer: Self-pay | Admitting: Cardiovascular Disease

## 2014-04-23 NOTE — Telephone Encounter (Signed)
Closed encounter °

## 2014-04-29 ENCOUNTER — Encounter: Payer: Self-pay | Admitting: Cardiovascular Disease

## 2014-04-29 ENCOUNTER — Ambulatory Visit (INDEPENDENT_AMBULATORY_CARE_PROVIDER_SITE_OTHER): Payer: 59 | Admitting: Cardiovascular Disease

## 2014-04-29 VITALS — BP 128/76 | HR 88 | Ht 66.5 in | Wt 182.0 lb

## 2014-04-29 DIAGNOSIS — I10 Essential (primary) hypertension: Secondary | ICD-10-CM

## 2014-04-29 DIAGNOSIS — I6529 Occlusion and stenosis of unspecified carotid artery: Secondary | ICD-10-CM

## 2014-04-29 DIAGNOSIS — I739 Peripheral vascular disease, unspecified: Secondary | ICD-10-CM

## 2014-04-29 DIAGNOSIS — I779 Disorder of arteries and arterioles, unspecified: Secondary | ICD-10-CM

## 2014-04-29 DIAGNOSIS — E785 Hyperlipidemia, unspecified: Secondary | ICD-10-CM

## 2014-04-29 NOTE — Assessment & Plan Note (Signed)
Controlled on current medications 

## 2014-04-29 NOTE — Patient Instructions (Signed)
  We will see you back in follow up in 1 year with Dr Gwenlyn Found.   Dr Gwenlyn Found has ordered a carotid Duplex- This test is an ultrasound of the carotid arteries in your neck. It looks at blood flow through these arteries that supply the brain with blood. Allow one hour for this exam. There are no restrictions or special instructions.

## 2014-04-29 NOTE — Assessment & Plan Note (Signed)
On statin therapy. Her most recent lipid profile performed in October of last year revealed total cholesterol 169, LDL of 103 and HDL of 41

## 2014-04-29 NOTE — Assessment & Plan Note (Signed)
Status post left carotid endarterectomy 08/09/05. Her last carotid Doppler study was November 2011. We will repeat this.

## 2014-04-29 NOTE — Progress Notes (Signed)
04/29/2014 Lindsey Mullins   1946-06-11  [id number]  Primary Physician Vic Blackbird, MD Primary Cardiologist: Lorretta Harp MD [name]   HPI:  The patient is a 68 year old, mildly overweight, divorced, African American female, mother of two, grandmother to one grandchild, who was seen by [name] on May 06, 2012. The last time I saw her in the office was August 09, 2010. She has a history of hypertension, hyperlipidemia, and vascular disease. I performed an angiogram on her on July 14, 2005, revealing a high grade left internal carotid artery stenosis. She underwent elective left carotid endarterectomy by Dr. Drucie Opitz on August 09, 2005, with followup Dopplers done in our office as recently as last October showing a widely patent endarterectomy site with moderate right ICA stenosis. She remains neurologically asymptomatic. She was seen in the office by [name] with complaints of chest burning. She also has a history of GERD. She has had multiple negative Myoviews in the past and a Lexiscan Myoview performed on May 23, 2012, was negative as well. Her most recent lipid profile performed 08/21/13 revealed a glucose of 169, LDL 103 and HDL of 41.since I saw her a year ago she denies chest pain or shortness of breath    Current Outpatient Prescriptions  Medication Sig Dispense Refill  . aspirin 81 MG tablet Take 81 mg by mouth daily.      . carvedilol (COREG) 3.125 MG tablet TAKE ONE TABLET BY MOUTH ONCE DAILY  30 tablet  0  . cyclobenzaprine (FLEXERIL) 5 MG tablet TAKE ONE TABLET BY MOUTH AT BEDTIME AS NEEDED FOR MUSCLE SPASMS AND  FOR  LEG  CRAMP/SPASM  30 tablet  3  . Multiple Vitamin (MULTIVITAMIN) capsule Take 1 capsule by mouth daily.      [name] NITROSTAT 0.4 MG SL tablet DISSOLVE ONE TABLET UNDER THE TONGUE AS NEEDED FOR CHEST PAIN**MAX 3 TABLETS EACH 5 MINUTES APART**  25 tablet  3  . Omega-3 Krill Oil 500 MG CAPS Take 1 capsule by mouth daily.      [name]  omeprazole (PRILOSEC) 20 MG capsule Take 20 mg by mouth daily.      . simvastatin (ZOCOR) 20 MG tablet TAKE ONE TABLET BY MOUTH EVERY EVENING  30 tablet  4  . traMADol (ULTRAM) 50 MG tablet Take 50 mg by mouth every 6 (six) hours as needed for pain.      [name] gabapentin (NEURONTIN) 300 MG capsule Take 1 capsule (300 mg total) by mouth at bedtime.  30 capsule  1   No current facility-administered medications for this visit.    Allergies  Allergen Reactions  . Codeine   . Lipitor [Atorvastatin] Other (See Comments)    Myalgias  . Lisinopril Cough  . Penicillins   . Pravastatin Other (See Comments)    Myalgias    History   Social History  . Marital Status: Divorced    Spouse Name: N/A    Number of Children: N/A  . Years of Education: N/A   Occupational History  . Not on file.   Social History Main Topics  . Smoking status: Former Smoker    Quit date: 04/21/1983  . Smokeless tobacco: Not on file  . Alcohol Use: No  . Drug Use: No  . Sexual Activity: Not on file   Other Topics Concern  . Not on file   Social History Narrative  . No narrative on file     Review of Systems: General:  negative for chills, fever, night sweats or weight changes.  Cardiovascular: negative for chest pain, dyspnea on exertion, edema, orthopnea, palpitations, paroxysmal nocturnal dyspnea or shortness of breath Dermatological: negative for rash Respiratory: negative for cough or wheezing Urologic: negative for hematuria Abdominal: negative for nausea, vomiting, diarrhea, bright red blood per rectum, melena, or hematemesis Neurologic: negative for visual changes, syncope, or dizziness All other systems reviewed and are otherwise negative except as noted above.    Blood pressure 128/76, pulse 88, height 5' 6.5" (1.689 m), weight 182 lb (82.555 kg).  General appearance: alert and no distress Neck: no adenopathy, no JVD, supple, symmetrical, trachea midline, thyroid not enlarged, symmetric, no  tenderness/mass/nodules and soft right carotid bruit Lungs: clear to auscultation bilaterally Heart: regular rate and rhythm, S1, S2 normal, no murmur, click, rub or gallop Extremities: extremities normal, atraumatic, no cyanosis or edema  EKG normal sinus rhythm at 88 without ST or T wave changes  ASSESSMENT AND PLAN:   Carotid artery disease Status post left carotid endarterectomy 08/09/05. Her last carotid Doppler study was November 2011. We will repeat this.  Hyperlipidemia On statin therapy. Her most recent lipid profile performed in October of last year revealed total cholesterol 169, LDL of 103 and HDL of 41  Essential hypertension, benign Controlled on current medications      Lorretta Harp MD [facility], [facility] 04/29/2014 6:17 PM

## 2014-05-03 ENCOUNTER — Ambulatory Visit (INDEPENDENT_AMBULATORY_CARE_PROVIDER_SITE_OTHER): Payer: Medicare Other | Admitting: Family Medicine

## 2014-05-03 ENCOUNTER — Ambulatory Visit (HOSPITAL_COMMUNITY)
Admission: RE | Admit: 2014-05-03 | Discharge: 2014-05-03 | Disposition: A | Discharge status: Home / Self Care | Payer: Medicare Other | Source: Ambulatory Visit | Attending: Cardiovascular Disease | Admitting: Cardiovascular Disease

## 2014-05-03 ENCOUNTER — Encounter: Payer: Self-pay | Admitting: Family Medicine

## 2014-05-03 VITALS — BP 128/76 | HR 62 | Temp 97.5°F | Resp 14 | Ht 67.0 in | Wt 183.0 lb

## 2014-05-03 DIAGNOSIS — M21612 Bunion of left foot: Secondary | ICD-10-CM

## 2014-05-03 DIAGNOSIS — M21619 Bunion of unspecified foot: Secondary | ICD-10-CM

## 2014-05-03 DIAGNOSIS — I779 Disorder of arteries and arterioles, unspecified: Secondary | ICD-10-CM

## 2014-05-03 DIAGNOSIS — E663 Overweight: Secondary | ICD-10-CM

## 2014-05-03 DIAGNOSIS — G2581 Restless legs syndrome: Secondary | ICD-10-CM

## 2014-05-03 DIAGNOSIS — I739 Peripheral vascular disease, unspecified: Secondary | ICD-10-CM

## 2014-05-03 DIAGNOSIS — I6529 Occlusion and stenosis of unspecified carotid artery: Secondary | ICD-10-CM

## 2014-05-03 DIAGNOSIS — I10 Essential (primary) hypertension: Secondary | ICD-10-CM

## 2014-05-03 LAB — COMPREHENSIVE METABOLIC PANEL
ALBUMIN: 4.1 g/dL (ref 3.5–5.2)
ALK PHOS: 76 U/L (ref 39–117)
ALT: 14 U/L (ref 0–35)
AST: 29 U/L (ref 0–37)
BUN: 15 mg/dL (ref 6–23)
CO2: 24 mEq/L (ref 19–32)
Calcium: 9.8 mg/dL (ref 8.4–10.5)
Chloride: 105 mEq/L (ref 96–112)
Creat: 0.78 mg/dL (ref 0.50–1.10)
Glucose, Bld: 81 mg/dL (ref 70–99)
POTASSIUM: 4 meq/L (ref 3.5–5.3)
SODIUM: 139 meq/L (ref 135–145)
Total Bilirubin: 0.7 mg/dL (ref 0.2–1.2)
Total Protein: 6.9 g/dL (ref 6.0–8.3)

## 2014-05-03 LAB — CBC WITH DIFFERENTIAL/PLATELET
BASOS PCT: 0 % (ref 0–1)
Basophils Absolute: 0 10*3/uL (ref 0.0–0.1)
EOS PCT: 6 % — AB (ref 0–5)
Eosinophils Absolute: 0.4 10*3/uL (ref 0.0–0.7)
HCT: 35.3 % — ABNORMAL LOW (ref 36.0–46.0)
Hemoglobin: 11.8 g/dL — ABNORMAL LOW (ref 12.0–15.0)
Lymphocytes Relative: 38 % (ref 12–46)
Lymphs Abs: 2.5 10*3/uL (ref 0.7–4.0)
MCH: 30.3 pg (ref 26.0–34.0)
MCHC: 33.4 g/dL (ref 30.0–36.0)
MCV: 90.5 fL (ref 78.0–100.0)
MONOS PCT: 7 % (ref 3–12)
Monocytes Absolute: 0.5 10*3/uL (ref 0.1–1.0)
NEUTROS PCT: 49 % (ref 43–77)
Neutro Abs: 3.3 10*3/uL (ref 1.7–7.7)
Platelets: 301 10*3/uL (ref 150–400)
RBC: 3.9 MIL/uL (ref 3.87–5.11)
RDW: 13.9 % (ref 11.5–15.5)
WBC: 6.7 10*3/uL (ref 4.0–10.5)

## 2014-05-03 LAB — LIPID PANEL
Cholesterol: 175 mg/dL (ref 0–200)
HDL: 45 mg/dL (ref >39)
LDL CALC: 104 mg/dL — AB (ref 0–99)
Total CHOL/HDL Ratio: 3.9 Ratio
Triglycerides: 132 mg/dL (ref <150)
VLDL: 26 mg/dL (ref 0–40)

## 2014-05-03 NOTE — Progress Notes (Signed)
Patient ID: SABRINIA PRIEN, female   DOB: 09/28/1946, 68 y.o.   MRN: [id number]   Subjective:    Patient ID: Renita Papa, female    DOB: 10-Mar-1946, 68 y.o.   MRN: [id number]  Patient presents for 6 month F/U and Discuss podiatrist  patient to follow chronic medical problems medications. She wishes seen by cardiology she is due for fasting labs. She will like to be referred to a new podiatrist for her bunion she was unhappy with the work performed last podiatrist with her nail removal. She continues to have RLS and cramping controlled with flexeril  Declines colonoscopy  Review Of Systems:  GEN- denies fatigue, fever, weight loss,weakness, recent illness HEENT- denies eye drainage, change in vision, nasal discharge, CVS- denies chest pain, palpitations RESP- denies SOB, cough, wheeze MSK- +joint pain, muscle aches, injury Neuro- denies headache, dizziness, syncope, seizure activity       Objective:    BP 128/76  Pulse 62  Temp(Src) 97.5 F (36.4 C) (Oral)  Resp 14  Ht 5\' 7"  (1.702 m)  Wt 183 lb (83.008 kg)  BMI 28.66 kg/m2 GEN- NAD, alert and oriented x3 HEENT- PERRL, EOMI, non injected sclera, pink conjunctiva, MMM, oropharynx clear CVS- RRR, no murmur RESP-CTAB EXT- No edema MSK- bunion left foot, mild TTP,  Skin- thickened great toenails, long nails Pulses- Radial, DP- 2+          Assessment & Plan:      Problem List Items Addressed This Visit   Overweight - Primary   Essential hypertension, benign   Relevant Orders      Comprehensive metabolic panel      CBC with Differential   Carotid artery disease   Relevant Orders      Lipid panel   Bunion, left      Note: This dictation was prepared with Dragon dictation along with smaller phrase technology. Any transcriptional errors that result from this process are unintentional.

## 2014-05-03 NOTE — Assessment & Plan Note (Signed)
Blood pressure well controlled

## 2014-05-03 NOTE — Patient Instructions (Addendum)
Referral to podiatry  Stool cards We will call with lab results  Continue current medications Schedule eye appointment F/U 6 months

## 2014-05-03 NOTE — Progress Notes (Signed)
Carotid Duplex Completed. Adeena Bernabe, BS, RDMS, RVT  

## 2014-05-03 NOTE — Assessment & Plan Note (Signed)
She's had the best success with Flexeril should not do well with Requip or gabapentin

## 2014-05-04 ENCOUNTER — Encounter: Payer: Self-pay | Admitting: Family Medicine

## 2014-05-04 MED ORDER — CYCLOBENZAPRINE HCL 5 MG PO TABS: ORAL_TABLET | ORAL | Status: DC

## 2014-05-04 MED ORDER — CARVEDILOL 3.125 MG PO TABS: ORAL_TABLET | ORAL | Status: DC

## 2014-05-07 ENCOUNTER — Encounter: Payer: Self-pay | Admitting: *Deleted

## 2014-08-03 ENCOUNTER — Telehealth: Payer: Self-pay | Admitting: Family Medicine

## 2014-08-03 NOTE — Telephone Encounter (Signed)
LM pt to call back needs to be scheduled for [facility] LABS AND CPE

## 2014-08-14 ENCOUNTER — Other Ambulatory Visit: Payer: Self-pay | Admitting: Family Medicine

## 2014-08-16 NOTE — Telephone Encounter (Signed)
Refill appropriate and filled per protocol. 

## 2014-08-24 ENCOUNTER — Encounter: Payer: Self-pay | Admitting: Family Medicine

## 2014-08-24 ENCOUNTER — Telehealth: Payer: Self-pay | Admitting: Family Medicine

## 2014-08-24 NOTE — Telephone Encounter (Signed)
Letter sent to pt to call to schedule GREENFOLDER LAB AND CPE

## 2014-09-20 ENCOUNTER — Ambulatory Visit (INDEPENDENT_AMBULATORY_CARE_PROVIDER_SITE_OTHER): Payer: Medicare Other | Admitting: Family Medicine

## 2014-09-20 ENCOUNTER — Encounter: Payer: Self-pay | Admitting: Family Medicine

## 2014-09-20 VITALS — BP 120/74 | HR 76 | Temp 98.1°F | Resp 18 | Ht 66.5 in | Wt 181.0 lb

## 2014-09-20 DIAGNOSIS — I10 Essential (primary) hypertension: Secondary | ICD-10-CM

## 2014-09-20 DIAGNOSIS — J189 Pneumonia, unspecified organism: Secondary | ICD-10-CM

## 2014-09-20 LAB — CBC W/MCH & 3 PART DIFF
HEMATOCRIT: 37.2 % (ref 36.0–46.0)
Hemoglobin: 13.1 g/dL (ref 12.0–15.0)
LYMPHS PCT: 58 % — AB (ref 12–46)
Lymphs Abs: 3.2 10*3/uL (ref 0.7–4.0)
MCH: 32 pg (ref 26.0–34.0)
MCHC: 35.2 g/dL (ref 30.0–36.0)
MCV: 91 fL (ref 78.0–100.0)
NEUTROS ABS: 1.7 10*3/uL (ref 1.7–7.7)
Neutrophils Relative %: 30 % — ABNORMAL LOW (ref 43–77)
PLATELETS: 226 10*3/uL (ref 150–400)
RBC: 4.09 MIL/uL (ref 3.87–5.11)
RDW: 13.4 % (ref 11.5–15.5)
WBC mixed population %: 12 % (ref 3–18)
WBC: 5.6 10*3/uL (ref 4.0–10.5)
WBCMIX: 0.6 10*3/uL (ref 0.1–1.8)

## 2014-09-21 LAB — BASIC METABOLIC PANEL
BUN: 11 mg/dL (ref 6–23)
CALCIUM: 8.8 mg/dL (ref 8.4–10.5)
CO2: 22 mEq/L (ref 19–32)
Chloride: 102 mEq/L (ref 96–112)
Creat: 0.77 mg/dL (ref 0.50–1.10)
GLUCOSE: 87 mg/dL (ref 70–99)
Potassium: 3.7 mEq/L (ref 3.5–5.3)
Sodium: 136 mEq/L (ref 135–145)

## 2014-09-21 NOTE — Patient Instructions (Signed)
Unable to give Epic AVS due to system not working

## 2014-09-21 NOTE — Progress Notes (Signed)
Patient ID: Lindsey Mullins, female   DOB: [birth year], 68 y.o.   MRN: [id number]   Subjective:    Patient ID: Lindsey Mullins, female    DOB: 1946/06/15, 68 y.o.   MRN: [id number]  Patient presents for Cough  Pt here with cough with production, low grade fever, fatigue worsening over past week, initially thought it was her allergies, using Coricidan with minimal improvement, feels worse than a typical cold for her. Appetite decreased some, drinking fluids. Also has had bad indigestion despite taking her GI meds and had 1 episode of chest pain which was substernal resolved quickly. Denies SOB, + sick contacts  She also had an episode of feeling warm all other then having a loose bowel movement, this has been going on for many years, states it started after her expoloratory surgery for her bowels, not sure if it only happens when she is sick or stressed out   Review Of Systems:  GEN- denies fatigue, fever, weight loss,weakness, recent illness HEENT- denies eye drainage, change in vision, nasal discharge, CVS- + chest pain, palpitations RESP- denies SOB,+ cough, wheeze ABD- denies N/V, change in stools, abd pain GU- denies dysuria, hematuria, dribbling, incontinence MSK- denies joint pain, muscle aches, injury Neuro- denies headache, dizziness, syncope, seizure activity       Objective:    BP 120/74 mmHg  Pulse 76  Temp(Src) 98.1 F (36.7 C)  Resp 18  Ht 5' 6.5" (1.689 m)  Wt 181 lb (82.101 kg)  BMI 28.78 kg/m2 GEN- NAD, alert and oriented x3, ill appearing HEENT- PERRL, EOMI, non injected sclera, pink conjunctiva, MMM, oropharynx clear Neck- Supple, no LAD CVS- RRR, no murmur RESP-rales right lower lobe, no wheeze, normal WOB ABD-NABS,soft,NT,ND EXT- No edema Pulses- Radial 2+   EKG- NSR. Few PAC, no ST changes     Assessment & Plan:      Problem List Items Addressed This Visit    Essential hypertension, benign - Primary   Relevant Orders      Basic Metabolic Panel       CBC w/MCH & 3 Part Diff (Completed)    Other Visit Diagnoses    CAP (community acquired pneumonia)        Concern for CAP based on exam and age, cover with Levaquin, CBC w diff in office unremarkable, CXR if no improvement, levaquin x 7 days, tessalon perrles    Relevant Medications       levofloxacin (LEVAQUIN) 500 MG tablet       benzonatate (TESSALON) 100 MG capsule    Other Relevant Orders       Basic Metabolic Panel       CBC w/MCH & 3 Part Diff (Completed)       Note: This dictation was prepared with Dragon dictation along with smaller phrase technology. Any transcriptional errors that result from this process are unintentional.

## 2014-09-23 ENCOUNTER — Telehealth: Payer: Self-pay | Admitting: Family Medicine

## 2014-09-23 NOTE — Telephone Encounter (Signed)
Called pt back and she stated that Dr. Buelah Manis had mentioned getting a chest xray if no better, I looked at ov notes and see that provider had given levaquin for 7 days and stated if no better then will do xray at that time. I instructed pt to wait and see if antibiotic helps when finished and if she stills isnt better then we will order xray and to cal to let us know. Pt agreed and will call back Monday if no better.

## 2014-09-23 NOTE — Telephone Encounter (Signed)
[phone]  Pt is wanting to get the chest xray done that Dr Buelah Manis had suggested, she had said something about having it done at [facility] since she lives in [place]. She would like to have a call when the order is in, and would like to have it done today by 2 because she has to go to work.

## 2014-11-03 ENCOUNTER — Ambulatory Visit: Payer: Medicare Other | Admitting: Physician Assistant

## 2014-11-04 ENCOUNTER — Ambulatory Visit: Payer: Medicare Other | Admitting: Physician Assistant

## 2014-11-05 ENCOUNTER — Encounter: Payer: Medicare Other | Admitting: Family Medicine

## 2014-11-08 ENCOUNTER — Encounter: Payer: Medicare Other | Admitting: Family Medicine

## 2014-11-17 ENCOUNTER — Encounter: Payer: Self-pay | Admitting: Family Medicine

## 2014-11-17 ENCOUNTER — Ambulatory Visit (INDEPENDENT_AMBULATORY_CARE_PROVIDER_SITE_OTHER): Payer: Medicare Other | Admitting: Family Medicine

## 2014-11-17 VITALS — BP 138/80 | HR 78 | Temp 98.4°F | Resp 16 | Ht 67.0 in | Wt 184.0 lb

## 2014-11-17 DIAGNOSIS — I10 Essential (primary) hypertension: Secondary | ICD-10-CM

## 2014-11-17 DIAGNOSIS — Z13820 Encounter for screening for osteoporosis: Secondary | ICD-10-CM

## 2014-11-17 DIAGNOSIS — Z0000 Encounter for general adult medical examination without abnormal findings: Secondary | ICD-10-CM

## 2014-11-17 DIAGNOSIS — Z23 Encounter for immunization: Secondary | ICD-10-CM

## 2014-11-17 DIAGNOSIS — E785 Hyperlipidemia, unspecified: Secondary | ICD-10-CM

## 2014-11-17 MED ORDER — CYCLOBENZAPRINE HCL 5 MG PO TABS: ORAL_TABLET | ORAL | Status: DC

## 2014-11-17 MED ORDER — SIMVASTATIN 20 MG PO TABS: 20.0000 mg | ORAL_TABLET | Freq: Every evening | ORAL | Status: DC

## 2014-11-17 MED ORDER — CARVEDILOL 3.125 MG PO TABS: ORAL_TABLET | ORAL | Status: DC

## 2014-11-17 NOTE — Progress Notes (Signed)
Patient ID: Lindsey Mullins, female   DOB: 04-10-1946, 68 y.o.   MRN: [id number] Subjective:   Patient presents for Medicare Annual/Subsequent preventive examination.  Patient here for a wellness exam. She has no specific concerns today. Her medications were reviewed. She is not fasting today therefore return for lab work. Preventative medicine discussed. She did have another cold about a week ago which she is now getting over her cough is very minimal no fever no shortness of breath.    Review Past Medical/Family/Social: Per EMR   Risk Factors  Current exercise habits: None Dietary issues discussed: HNT, Cholesterol  Cardiac risk factors: .   Depression Screen  (Note: if answer to either of the following is "Yes", a more complete depression screening is indicated)  Over the past two weeks, have you felt down, depressed or hopeless? Yes Over the past two weeks, have you felt little interest or pleasure in doing things? No Have you lost interest or pleasure in daily life? No Do you often feel hopeless? No Do you cry easily over simple problems? No   Activities of Daily Living  In your present state of health, do you have any difficulty performing the following activities?:  Driving? No  Managing money? No  Feeding yourself? No  Getting from bed to chair? No  Climbing a flight of stairs? No  Preparing food and eating?: No  Bathing or showering? No  Getting dressed: No  Getting to the toilet? No  Using the toilet:No  Moving around from place to place: No  In the past year have you fallen or had a near fall?:No  Are you sexually active? No  Do you have more than one partner? No   Hearing Difficulties: No  Do you often ask people to speak up or repeat themselves? No  Do you experience ringing or noises in your ears? No Do you have difficulty understanding soft or whispered voices? No  Do you feel that you have a problem with memory? No Do you often misplace items? No  Do you  feel safe at home? Yes  Cognitive Testing  Alert? Yes Normal Appearance?Yes  Oriented to person? Yes Place? Yes  Time? Yes  Recall of three objects? Yes  Can perform simple calculations? Yes  Displays appropriate judgment?Yes  Can read the correct time from a watch face?Yes   List the Names of Other Physician/Practitioners you currently use:   Cardiology  Screening Tests / Date Colonoscopy   Declines                  Zostavax due Mammogram UTD Influenza Vaccine UTD Tetanus/tdap - UTD  ROS: GEN- denies fatigue, fever, weight loss,weakness, recent illness HEENT- denies eye drainage, change in vision, nasal discharge, CVS- denies chest pain, palpitations RESP- denies SOB, cough, wheeze ABD- denies N/V, change in stools, abd pain GU- denies dysuria, hematuria, dribbling, incontinence MSK- denies joint pain, muscle aches, injury Neuro- denies headache, dizziness, syncope, seizure activity  PHYSICAL GEN- NAD, alert and oriented x3 HEENT- PERRL, EOMI, non injected sclera, pink conjunctiva, MMM, oropharynx clear Neck- Supple, no thryomegaly CVS- RRR, no murmur RESP-mild crackles left base, normal WOB, no rales, no wheeze EXT- No edema Pulses- Radial, DP- 2+  GU - deferred  Assessment:    Annual wellness medicare exam   Plan:    During the course of the visit the patient was educated and counseled about appropriate screening and preventive services including:  Screening mammography  Bone Density  Pt  declines Colonoscopy  Pt to schedule with GYN for PAP  Screen NEG  for depression.    Diet review for nutrition referral? Yes ____ Not Indicated __x__  Patient Instructions (the written plan) was given to the patient.  Medicare Attestation  I have personally reviewed:  The patient's medical and social history  Their use of alcohol, tobacco or illicit drugs  Their current medications and supplements  The patient's functional ability including ADLs,fall risks, home  safety risks, cognitive, and hearing and visual impairment  Diet and physical activities  Evidence for depression or mood disorders  The patient's weight, height, BMI, and visual acuity have been recorded in the chart. I have made referrals, counseling, and provided education to the patient based on review of the above and I have provided the patient with a written personalized care plan for preventive services.

## 2014-11-17 NOTE — Patient Instructions (Addendum)
Bone Density Get the fasting labs done in [place]  Schedule with Dr. Raphael Gibney Flu shot  F/U 3 months

## 2014-11-25 ENCOUNTER — Encounter: Payer: Self-pay | Admitting: *Deleted

## 2014-12-07 LAB — CBC WITH DIFFERENTIAL/PLATELET
BASOS PCT: 1 % (ref 0–1)
Basophils Absolute: 0.1 10*3/uL (ref 0.0–0.1)
EOS PCT: 5 % (ref 0–5)
Eosinophils Absolute: 0.3 10*3/uL (ref 0.0–0.7)
HCT: 37 % (ref 36.0–46.0)
HEMOGLOBIN: 12.5 g/dL (ref 12.0–15.0)
LYMPHS PCT: 44 % (ref 12–46)
Lymphs Abs: 2.9 10*3/uL (ref 0.7–4.0)
MCH: 30.6 pg (ref 26.0–34.0)
MCHC: 33.8 g/dL (ref 30.0–36.0)
MCV: 90.5 fL (ref 78.0–100.0)
MONO ABS: 0.5 10*3/uL (ref 0.1–1.0)
MONOS PCT: 7 % (ref 3–12)
MPV: 9.7 fL (ref 8.6–12.4)
Neutro Abs: 2.8 10*3/uL (ref 1.7–7.7)
Neutrophils Relative %: 43 % (ref 43–77)
PLATELETS: 313 10*3/uL (ref 150–400)
RBC: 4.09 MIL/uL (ref 3.87–5.11)
RDW: 13.6 % (ref 11.5–15.5)
WBC: 6.5 10*3/uL (ref 4.0–10.5)

## 2014-12-07 LAB — LIPID PANEL
Cholesterol: 228 mg/dL — ABNORMAL HIGH (ref 0–200)
HDL: 45 mg/dL (ref >39)
LDL Cholesterol: 157 mg/dL — ABNORMAL HIGH (ref 0–99)
Total CHOL/HDL Ratio: 5.1 ratio
Triglycerides: 132 mg/dL (ref <150)
VLDL: 26 mg/dL (ref 0–40)

## 2014-12-07 LAB — COMPREHENSIVE METABOLIC PANEL
ALBUMIN: 4.1 g/dL (ref 3.5–5.2)
ALT: 11 U/L (ref 0–35)
AST: 25 U/L (ref 0–37)
Alkaline Phosphatase: 79 U/L (ref 39–117)
BUN: 13 mg/dL (ref 6–23)
CALCIUM: 8.8 mg/dL (ref 8.4–10.5)
CO2: 24 meq/L (ref 19–32)
CREATININE: 0.76 mg/dL (ref 0.50–1.10)
Chloride: 106 mEq/L (ref 96–112)
Glucose, Bld: 86 mg/dL (ref 70–99)
POTASSIUM: 3.9 meq/L (ref 3.5–5.3)
Sodium: 141 mEq/L (ref 135–145)
Total Bilirubin: 0.6 mg/dL (ref 0.2–1.2)
Total Protein: 6.9 g/dL (ref 6.0–8.3)

## 2014-12-14 ENCOUNTER — Encounter: Payer: Self-pay | Admitting: Family Medicine

## 2015-01-21 ENCOUNTER — Encounter: Payer: Self-pay | Admitting: Family Medicine

## 2015-01-21 ENCOUNTER — Ambulatory Visit: Payer: Self-pay | Admitting: Family Medicine

## 2015-01-26 ENCOUNTER — Ambulatory Visit (INDEPENDENT_AMBULATORY_CARE_PROVIDER_SITE_OTHER): Payer: Medicare Other | Admitting: Family Medicine

## 2015-01-26 ENCOUNTER — Encounter: Payer: Self-pay | Admitting: Family Medicine

## 2015-01-26 VITALS — BP 128/80 | HR 82 | Temp 97.7°F | Resp 16 | Ht 67.0 in | Wt 184.0 lb

## 2015-01-26 DIAGNOSIS — F4329 Adjustment disorder with other symptoms: Secondary | ICD-10-CM | POA: Diagnosis not present

## 2015-01-26 DIAGNOSIS — K219 Gastro-esophageal reflux disease without esophagitis: Secondary | ICD-10-CM

## 2015-01-26 DIAGNOSIS — E785 Hyperlipidemia, unspecified: Secondary | ICD-10-CM | POA: Diagnosis not present

## 2015-01-26 DIAGNOSIS — I10 Essential (primary) hypertension: Secondary | ICD-10-CM | POA: Diagnosis not present

## 2015-01-26 NOTE — Progress Notes (Signed)
Patient ID: Lindsey Mullins, female   DOB: 1946-11-19, 69 y.o.   MRN: [id number]   Subjective:    Patient ID: Lindsey Mullins, female    DOB: 1946-05-19, 69 y.o.   MRN: [id number]  Patient presents for Acid reflux and Fatigue  Patient here with worsening acid reflux. She states that when she gets stressed that she notices it more she has a burning sensation that goes from the epigastric region upper chest and she has a sour taste in her mouth. She's been using Prilosec over-the-counter but finds herself eating Tums on top of that. She denies any chest pain or shortness of breath nausea vomiting. No change in her bowels  She is also very stressed out regarding her mother who has Alzheimer's dementia. Her behavior continues to worsen it is difficult to help control her and get her to sleep. She sleeps herself very poorly often only a few hours at night because she gets up to work as well as assist her mother and her brother she feels like she is always on the go she never gets time to decompress. She does not want to take any medications for her anxiety or mood.  Review Of Systems:  GEN- + fatigue, fever, weight loss,weakness, recent illness HEENT- denies eye drainage, change in vision, nasal discharge, CVS- denies chest pain, palpitations RESP- denies SOB, cough, wheeze ABD- denies N/V, change in stools, +abd pain GU- denies dysuria, hematuria, dribbling, incontinence MSK- denies joint pain, muscle aches, injury Neuro- denies headache, dizziness, syncope, seizure activity       Objective:    BP 128/80 mmHg  Pulse 82  Temp(Src) 97.7 F (36.5 C) (Oral)  Resp 16  Ht 5\' 7"  (1.702 m)  Wt 184 lb (83.462 kg)  BMI 28.81 kg/m2 GEN- NAD, alert and oriented x3 HEENT- PERRL, EOMI, non injected sclera, pink conjunctiva, MMM, oropharynx clear Neck- supple, no thyromegaly CVS- RRR, no murmur RESP-CTAB ABD-NABS,soft,NT,ND EXT- No edema Psych- normal affect and mood, well groomed, normal  speech, not anxious or depressed appearing Pulses- Radial 2+        Assessment & Plan:      Problem List Items Addressed This Visit      Unprioritized   Stress and adjustment reaction   GERD (gastroesophageal reflux disease)   Essential hypertension, benign - Primary      Note: This dictation was prepared with Dragon dictation along with smaller phrase technology. Any transcriptional errors that result from this process are unintentional.

## 2015-01-26 NOTE — Assessment & Plan Note (Signed)
She is going through a lot of stress and this is actually been a long-term thing as long as I have known Lindsey Mullins. She takes on a lot trying to care for her mother as well as her brother she also works part-time which she actually enjoys as it is a break from her day-to-day routine with her mother. She does not want to take any prescribed medications. Her sleep is very poor and if she can improve this would help with some of the fatigue. She is in agreement with trying melatonin at bedtime

## 2015-01-26 NOTE — Patient Instructions (Signed)
Get the the labs done fasting in 2 months Try the dexilant for your stomach  Try the melatonin  F/U 3 months

## 2015-01-26 NOTE — Assessment & Plan Note (Signed)
I have given her Nexium 40 mg samples to try she also seems which foods to avoid to prevent her symptoms

## 2015-01-26 NOTE — Assessment & Plan Note (Signed)
On zocor, repeat labs in 2 months

## 2015-02-16 ENCOUNTER — Ambulatory Visit: Payer: Medicare Other | Admitting: Family Medicine

## 2015-02-23 ENCOUNTER — Other Ambulatory Visit: Payer: Self-pay | Admitting: *Deleted

## 2015-02-23 MED ORDER — ESOMEPRAZOLE MAGNESIUM 40 MG PO PACK: 40.0000 mg | PACK | Freq: Every day | ORAL | Status: DC

## 2015-02-23 NOTE — Telephone Encounter (Signed)
Received call from patient requesting refill on sample Nexium that MD had given.   Prescription sent to pharmacy.

## 2015-03-07 ENCOUNTER — Other Ambulatory Visit: Payer: Self-pay

## 2015-03-07 DIAGNOSIS — Z1231 Encounter for screening mammogram for malignant neoplasm of breast: Secondary | ICD-10-CM

## 2015-03-16 ENCOUNTER — Ambulatory Visit: Payer: Self-pay

## 2015-04-08 ENCOUNTER — Telehealth: Payer: Self-pay | Admitting: *Deleted

## 2015-04-08 ENCOUNTER — Ambulatory Visit
Admission: RE | Admit: 2015-04-08 | Discharge: 2015-04-08 | Disposition: A | Discharge status: Home / Self Care | Payer: Medicare Other | Source: Ambulatory Visit

## 2015-04-08 DIAGNOSIS — Z1231 Encounter for screening mammogram for malignant neoplasm of breast: Secondary | ICD-10-CM

## 2015-04-08 NOTE — Telephone Encounter (Signed)
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 I called pt left message in vm needs to come to office to fill out page 1 and pay fee.

## 2015-04-25 ENCOUNTER — Other Ambulatory Visit: Payer: Self-pay | Admitting: Family Medicine

## 2015-04-26 NOTE — Telephone Encounter (Signed)
Okay to refill give 3 

## 2015-04-26 NOTE — Telephone Encounter (Signed)
Ok to refill 

## 2015-04-26 NOTE — Telephone Encounter (Signed)
Prescription sent to pharmacy.

## 2015-04-29 ENCOUNTER — Ambulatory Visit: Payer: Medicare Other | Admitting: Family Medicine

## 2015-05-06 ENCOUNTER — Other Ambulatory Visit: Payer: Self-pay | Admitting: Cardiovascular Disease

## 2015-05-06 DIAGNOSIS — I6529 Occlusion and stenosis of unspecified carotid artery: Secondary | ICD-10-CM

## 2015-05-11 ENCOUNTER — Ambulatory Visit (HOSPITAL_COMMUNITY)
Admission: RE | Admit: 2015-05-11 | Discharge: 2015-05-11 | Disposition: A | Discharge status: Home / Self Care | Payer: Medicare Other | Source: Ambulatory Visit | Attending: Cardiovascular Disease | Admitting: Cardiovascular Disease

## 2015-05-11 DIAGNOSIS — I6529 Occlusion and stenosis of unspecified carotid artery: Secondary | ICD-10-CM | POA: Diagnosis not present

## 2015-05-17 ENCOUNTER — Encounter: Payer: Self-pay | Admitting: Cardiovascular Disease

## 2015-05-17 ENCOUNTER — Ambulatory Visit (INDEPENDENT_AMBULATORY_CARE_PROVIDER_SITE_OTHER): Payer: Medicare Other | Admitting: Cardiovascular Disease

## 2015-05-17 VITALS — BP 142/98 | HR 70 | Ht 66.0 in | Wt 188.7 lb

## 2015-05-17 DIAGNOSIS — I739 Peripheral vascular disease, unspecified: Secondary | ICD-10-CM

## 2015-05-17 DIAGNOSIS — E785 Hyperlipidemia, unspecified: Secondary | ICD-10-CM

## 2015-05-17 DIAGNOSIS — Z79899 Other long term (current) drug therapy: Secondary | ICD-10-CM

## 2015-05-17 DIAGNOSIS — I779 Disorder of arteries and arterioles, unspecified: Secondary | ICD-10-CM

## 2015-05-17 DIAGNOSIS — I10 Essential (primary) hypertension: Secondary | ICD-10-CM | POA: Diagnosis not present

## 2015-05-17 MED ORDER — LOSARTAN POTASSIUM 50 MG PO TABS: 50.0000 mg | ORAL_TABLET | Freq: Every day | ORAL | Status: DC

## 2015-05-17 NOTE — Assessment & Plan Note (Signed)
History of hyperlipidemia on simvastatin 20 mg a day. Her last lipid profile performed 12/07/14 revealed a total cholesterol 228, LDL 157 and HDL 45 his represents a 50% increase from 6 months prior. She says she has not changed her diet significantly. I'm going to recheck a lipid and liver profile.

## 2015-05-17 NOTE — Assessment & Plan Note (Signed)
History of hypertension blood pressure measured at 142/98. She is on low-dose carvedilol. She has an Ace cough in the past. I'm going to start losartan 50 mg a day and will check a basic metabolic panel in 2-3 weeks followed by a office visit to see [name], or pharmacist, for check of blood work and blood pressure with medicine titration as needed.

## 2015-05-17 NOTE — Patient Instructions (Signed)
Medication Instructions:  START Losartan 50 mg - take 1 tablet (50 mg total) by mouth daily. A new prescription has been sent to your pharmacy electronically.  Labwork: Your physician recommends that you return for lab work in 1 week - FASTING.  Follow-Up: Please schedule an appointment with our pharmacist, Tommy Medal, for a blood pressure check. Please keep a record of your blood pressures to bring with you to this appointment.  Your physician recommends that you schedule a follow-up appointment in 1 year with an extender. Dr Gwenlyn Found recommends that you schedule a follow-up appointment in 2 years. You will receive a reminder letter in the mail two months in advance. If you don't receive a letter, please call our office to schedule the follow-up appointment.

## 2015-05-17 NOTE — Assessment & Plan Note (Signed)
History of carotid artery disease status post remote left carotid endarterectomy performed by Dr. Amedeo Plenty 08/09/05 with her most recent Dopplers performed 04/10/15 revealing a widely patent endarterectomy site with mild right ICA stenosis. We'll continue to follow this on annual basis

## 2015-05-17 NOTE — Progress Notes (Signed)
05/17/2015 Lindsey Mullins   14-Dec-1945  [id number]  Primary Physician Vic Blackbird, MD Primary Cardiologist: Lorretta Harp MD [name]   HPI:  The patient is a 68 year old, mildly overweight, divorced, African American female, mother of two, grandmother to one grandchild, who I last saw in the office 04/29/14. Lindsey Mullins Kitchen She has a history of hypertension, hyperlipidemia, and vascular disease. I performed an angiogram on her on July 14, 2005, revealing a high grade left internal carotid artery stenosis. She underwent elective left carotid endarterectomy by Dr. Drucie Opitz on August 09, 2005, with followup Dopplers done in our office as recently as last October showing a widely patent endarterectomy site with moderate right ICA stenosis. She remains neurologically asymptomatic. She was seen in the office by [name] with complaints of chest burning. She also has a history of GERD. She has had multiple negative Myoviews in the past and a Lexiscan Myoview performed on May 23, 2012, was negative as well. Her most recent lipid profile performed 12/07/14 revealed a total cholesterol 228, LDL 157 and HDL of 45. Since I saw her last she denies chest pain or shortness of breath.  Current Outpatient Prescriptions  Medication Sig Dispense Refill  . aspirin 81 MG tablet Take 81 mg by mouth daily.    . carvedilol (COREG) 3.125 MG tablet TAKE ONE TABLET BY MOUTH ONCE DAILY 30 tablet 6  . cyclobenzaprine (FLEXERIL) 5 MG tablet TAKE ONE TABLET BY MOUTH ONCE DAILY AT BEDTIME AS NEEDED FOR MUSCLE SPASMS AND LEG  CRAMPS/SPASMS 30 tablet 3  . esomeprazole (NEXIUM) 40 MG packet Take 40 mg by mouth daily before breakfast. 30 each 12  . loratadine (CLARITIN) 10 MG tablet Take 10 mg by mouth daily.    . Multiple Vitamin (MULTIVITAMIN) capsule Take 1 capsule by mouth daily.    Lindsey Mullins Kitchen NITROSTAT 0.4 MG SL tablet DISSOLVE ONE TABLET UNDER THE TONGUE AS NEEDED FOR CHEST PAIN**MAX 3 TABLETS EACH 5 MINUTES  APART** 25 tablet 3  . Omega-3 Krill Oil 500 MG CAPS Take 1 capsule by mouth daily.    . simvastatin (ZOCOR) 20 MG tablet Take 1 tablet (20 mg total) by mouth every evening. 30 tablet 6  . losartan (COZAAR) 50 MG tablet Take 1 tablet (50 mg total) by mouth daily. 30 tablet 11   No current facility-administered medications for this visit.    Allergies  Allergen Reactions  . Codeine   . Lipitor [Atorvastatin] Other (See Comments)    Myalgias  . Lisinopril Cough  . Penicillins   . Pravastatin Other (See Comments)    Myalgias    History   Social History  . Marital Status: Divorced    Spouse Name: N/A  . Number of Children: N/A  . Years of Education: N/A   Occupational History  . Not on file.   Social History Main Topics  . Smoking status: Former Smoker    Quit date: 04/21/1983  . Smokeless tobacco: Never Used  . Alcohol Use: No  . Drug Use: No  . Sexual Activity: Not on file   Other Topics Concern  . Not on file   Social History Narrative     Review of Systems: General: negative for chills, fever, night sweats or weight changes.  Cardiovascular: negative for chest pain, dyspnea on exertion, edema, orthopnea, palpitations, paroxysmal nocturnal dyspnea or shortness of breath Dermatological: negative for rash Respiratory: negative for cough or wheezing Urologic: negative for hematuria Abdominal: negative for nausea, vomiting, diarrhea,  bright red blood per rectum, melena, or hematemesis Neurologic: negative for visual changes, syncope, or dizziness All other systems reviewed and are otherwise negative except as noted above.    Blood pressure 142/98, pulse 70, height '5\' 6"'$  (1.676 m), weight 188 lb 11.2 oz (85.594 kg).  General appearance: alert and no distress Neck: no adenopathy, no carotid bruit, no JVD, supple, symmetrical, trachea midline and thyroid not enlarged, symmetric, no tenderness/mass/nodules Lungs: clear to auscultation bilaterally Heart: regular rate  and rhythm, S1, S2 normal, no murmur, click, rub or gallop Extremities: extremities normal, atraumatic, no cyanosis or edema  EKG normal sinus rhythm at 70 with nonspecific ST and T-wave changes. I personally reviewed this EKG  ASSESSMENT AND PLAN:   Hyperlipidemia History of hyperlipidemia on simvastatin 20 mg a day. Her last lipid profile performed 12/07/14 revealed a total cholesterol 228, LDL 157 and HDL 45 his represents a 50% increase from 6 months prior. She says she has not changed her diet significantly. I'm going to recheck a lipid and liver profile.  Essential hypertension, benign History of hypertension blood pressure measured at 142/98. She is on low-dose carvedilol. She has an Ace cough in the past. I'm going to start losartan 50 mg a day and will check a basic metabolic panel in 2-3 weeks followed by a office visit to see [name], or pharmacist, for check of blood work and blood pressure with medicine titration as needed.  Carotid artery disease History of carotid artery disease status post remote left carotid endarterectomy performed by Dr. Amedeo Plenty 08/09/05 with her most recent Dopplers performed 04/10/15 revealing a widely patent endarterectomy site with mild right ICA stenosis. We'll continue to follow this on annual basis      Lorretta Harp MD [facility], [facility] 05/17/2015 9:26 AM

## 2015-05-18 LAB — HEPATIC FUNCTION PANEL
ALBUMIN: 4 g/dL (ref 3.5–5.2)
ALK PHOS: 88 U/L (ref 39–117)
ALT: 10 U/L (ref 0–35)
AST: 25 U/L (ref 0–37)
Bilirubin, Direct: 0.1 mg/dL (ref 0.0–0.3)
Indirect Bilirubin: 0.6 mg/dL (ref 0.2–1.2)
TOTAL PROTEIN: 6.9 g/dL (ref 6.0–8.3)
Total Bilirubin: 0.7 mg/dL (ref 0.2–1.2)

## 2015-05-18 LAB — LIPID PANEL
Cholesterol: 175 mg/dL (ref 0–200)
HDL: 46 mg/dL (ref >=46)
LDL CALC: 97 mg/dL (ref 0–99)
Total CHOL/HDL Ratio: 3.8 Ratio
Triglycerides: 159 mg/dL — ABNORMAL HIGH (ref <150)
VLDL: 32 mg/dL (ref 0–40)

## 2015-05-18 LAB — BASIC METABOLIC PANEL
BUN: 10 mg/dL (ref 6–23)
CO2: 25 mEq/L (ref 19–32)
CREATININE: 0.78 mg/dL (ref 0.50–1.10)
Calcium: 9.2 mg/dL (ref 8.4–10.5)
Chloride: 104 mEq/L (ref 96–112)
Glucose, Bld: 81 mg/dL (ref 70–99)
Potassium: 4 mEq/L (ref 3.5–5.3)
Sodium: 142 mEq/L (ref 135–145)

## 2015-05-20 ENCOUNTER — Telehealth: Payer: Self-pay

## 2015-05-20 DIAGNOSIS — E785 Hyperlipidemia, unspecified: Secondary | ICD-10-CM

## 2015-05-20 DIAGNOSIS — Z79899 Other long term (current) drug therapy: Secondary | ICD-10-CM

## 2015-05-20 NOTE — Telephone Encounter (Signed)
lmtcb

## 2015-05-25 MED ORDER — SIMVASTATIN 40 MG PO TABS: 40.0000 mg | ORAL_TABLET | Freq: Every evening | ORAL | Status: DC

## 2015-05-25 NOTE — Telephone Encounter (Signed)
Patient aware of recommendations per Dr Gwenlyn Found. See lab results from 05/17/15. Simvastatin 40 sent to pharmacy electronically and labs due in 2 months ordered.

## 2015-06-16 ENCOUNTER — Encounter: Payer: Self-pay | Admitting: Pharmacist Clinician (PhC)/ Clinical Pharmacy Specialist

## 2015-06-16 ENCOUNTER — Ambulatory Visit (INDEPENDENT_AMBULATORY_CARE_PROVIDER_SITE_OTHER): Payer: Medicare Other | Admitting: Pharmacist Clinician (PhC)/ Clinical Pharmacy Specialist

## 2015-06-16 VITALS — BP 132/78 | HR 72 | Ht 66.0 in | Wt 189.2 lb

## 2015-06-16 DIAGNOSIS — I10 Essential (primary) hypertension: Secondary | ICD-10-CM

## 2015-06-16 NOTE — Patient Instructions (Signed)
Your blood pressure today is 132/78  (goal is < 140/90)  Check your blood pressure at home several times each week and keep record of the readings.  Take your BP meds as follows: continue losartan 50 mg daily, restart your carvedilol 3.125 mg daily  Exercise as you're able, try to walk approximately 30 minutes per day.  Keep salt intake to a minimum, especially watch canned and prepared boxed foods.  Eat more fresh fruits and vegetables and fewer canned items.  Avoid eating in fast food restaurants.    HOW TO TAKE YOUR BLOOD PRESSURE: . Rest 5 minutes before taking your blood pressure. .  Don't smoke or drink caffeinated beverages for at least 30 minutes before. . Take your blood pressure before (not after) you eat. . Sit comfortably with your back supported and both feet on the floor (don't cross your legs). . Elevate your arm to heart level on a table or a desk. . Use the proper sized cuff. It should fit smoothly and snugly around your bare upper arm. There should be enough room to slip a fingertip under the cuff. The bottom edge of the cuff should be 1 inch above the crease of the elbow. . Ideally, take 3 measurements at one sitting and record the average.

## 2015-06-16 NOTE — Progress Notes (Signed)
     06/16/2015 Lindsey Mullins 03/29/1946 [id number]   HPI:  Lindsey Mullins is a 69 y.o. female patient of Dr Gwenlyn Found with a PMH below who presents today for hypertension clinic evaluation.  Cardiac Hx: CAD post L endarterectomy in 2006, hyperlipidemia, hypertension  Family Hx: states only her mother has any cardiac history, irregular heartbeat, and she is alive (with dementia) at 70  Social Hx: no tobacco or alcohol, drinks occasional coffee, but not daily  Diet: lives alone, so does not eat "meals", just whatever sounds good when she's hungry.  Will add salt when cooking, but does not add at table.  Exercise: usually walks 2-4 miles on area trails each evening.  Has not done so in past 3 weeks because of high heat/humidity  Home BP readings: none, she has a wrist cuff and thinks she still has an arm cuff at home  Current antihypertensive medications: losartan 50 mg qd   Current Outpatient Prescriptions  Medication Sig Dispense Refill  . aspirin 81 MG tablet Take 81 mg by mouth daily.    . carvedilol (COREG) 3.125 MG tablet TAKE ONE TABLET BY MOUTH ONCE DAILY (Patient not taking: Reported on 06/16/2015) 30 tablet 6  . cyclobenzaprine (FLEXERIL) 5 MG tablet TAKE ONE TABLET BY MOUTH ONCE DAILY AT BEDTIME AS NEEDED FOR MUSCLE SPASMS AND LEG  CRAMPS/SPASMS 30 tablet 3  . esomeprazole (NEXIUM) 40 MG packet Take 40 mg by mouth daily before breakfast. 30 each 12  . loratadine (CLARITIN) 10 MG tablet Take 10 mg by mouth daily.    [name] losartan (COZAAR) 50 MG tablet Take 1 tablet (50 mg total) by mouth daily. 30 tablet 11  . Multiple Vitamin (MULTIVITAMIN) capsule Take 1 capsule by mouth daily.    [name] NITROSTAT 0.4 MG SL tablet DISSOLVE ONE TABLET UNDER THE TONGUE AS NEEDED FOR CHEST PAIN**MAX 3 TABLETS EACH 5 MINUTES APART** 25 tablet 3  . Omega-3 Krill Oil 500 MG CAPS Take 1 capsule by mouth daily.    . simvastatin (ZOCOR) 40 MG tablet Take 1 tablet (40 mg total) by mouth every evening. 30  tablet 11   No current facility-administered medications for this visit.    Allergies  Allergen Reactions  . Codeine   . Lipitor [Atorvastatin] Other (See Comments)    Myalgias  . Lisinopril Cough  . Penicillins   . Pravastatin Other (See Comments)    Myalgias    Past Medical History  Diagnosis Date  . Hypertension   . Hyperlipidemia   . Restless leg syndrome   . GERD (gastroesophageal reflux disease)   . Cerebral atherosclerosis 09/29/2012    CAROTID DUPLEX - RIGHT  BULB/PROXIMAL ICA-moderate amount of fibrous plaque, 50-69% diameter reduction; LEFT CEA-normal, no significant diameter reduction  . Atypical chest pain 05/23/2012    STRESS TEST - small to moderate sized area of partial reversibility of the anteroapical wall, most likely breast artifact, post-stress EF 67%, EKG show NSR at 65, No Lexiscan EKG changes, non-diagnostic for ischemia; STRESS TEST, 01/25/2010 - normal study, post-stress EF 66%, no significant ischemia  . TIA (transient ischemic attack) 01/27/2010    2D ECHO - EF 65%, normal  . Shortness of breath 03/30/2005    2D ECHO - EF >55%, normal    Blood pressure 132/78, pulse 72, height '5\' 6"'$  (1.676 m), weight 189 lb 3.2 oz (85.821 kg).    Lindsey Mullins PharmD CPP [facility]

## 2015-06-16 NOTE — Assessment & Plan Note (Signed)
Pt BP much improved today with addition of losartan 50 mg.  Pt does report that she thought she was to stop the carvedilol, and has not taken in past month.  I encouraged her to restart this, as Dr. Gwenlyn Found made no note indicating she should stop.  She agrees to restart, but does not want any more medications.  I assured her that her pressure today is good and that at this time she won't need any further medications.  Encouraged her to continue with her daily walks, as soon as the weather allows.  She is to take her BP at home 2-3 times each week and keep track of her results.  If she sees any trends to >150/90 she knows to call the office.

## 2015-06-24 ENCOUNTER — Other Ambulatory Visit: Payer: Self-pay

## 2015-06-24 MED ORDER — SIMVASTATIN 40 MG PO TABS: 40.0000 mg | ORAL_TABLET | Freq: Every evening | ORAL | Status: DC

## 2015-06-24 NOTE — Telephone Encounter (Signed)
Rx(s) sent to pharmacy electronically.  

## 2015-07-30 ENCOUNTER — Other Ambulatory Visit: Payer: Self-pay | Admitting: Family Medicine

## 2015-08-01 NOTE — Telephone Encounter (Signed)
Refill appropriate and filled per protocol. 

## 2015-09-14 ENCOUNTER — Other Ambulatory Visit: Payer: Self-pay | Admitting: Family Medicine

## 2015-09-15 NOTE — Telephone Encounter (Signed)
Ok to refill Flexeril?

## 2015-09-16 NOTE — Telephone Encounter (Signed)
Okay to refill? 

## 2015-09-16 NOTE — Telephone Encounter (Signed)
Prescription sent to pharmacy.

## 2015-10-10 ENCOUNTER — Ambulatory Visit: Payer: Medicare Other | Admitting: Family Medicine

## 2015-10-12 ENCOUNTER — Ambulatory Visit: Payer: Medicare Other | Admitting: Family Medicine

## 2015-10-18 ENCOUNTER — Ambulatory Visit: Payer: Medicare Other | Admitting: Family Medicine

## 2015-10-20 ENCOUNTER — Other Ambulatory Visit: Payer: Self-pay | Admitting: Family Medicine

## 2015-10-20 NOTE — Telephone Encounter (Signed)
Ok to refill 

## 2015-10-21 NOTE — Telephone Encounter (Signed)
Give 1 refill, needs OV for further refills

## 2015-10-21 NOTE — Telephone Encounter (Signed)
Medication filled x1 with no refills.  

## 2015-10-26 ENCOUNTER — Telehealth: Payer: Self-pay | Admitting: Family Medicine

## 2015-10-26 ENCOUNTER — Ambulatory Visit
Admission: RE | Admit: 2015-10-26 | Discharge: 2015-10-26 | Disposition: A | Discharge status: Home / Self Care | Payer: Medicare Other | Source: Ambulatory Visit | Attending: Family Medicine | Admitting: Family Medicine

## 2015-10-26 ENCOUNTER — Encounter: Payer: Self-pay | Admitting: Family Medicine

## 2015-10-26 ENCOUNTER — Ambulatory Visit (INDEPENDENT_AMBULATORY_CARE_PROVIDER_SITE_OTHER): Payer: Medicare Other | Admitting: Family Medicine

## 2015-10-26 VITALS — BP 140/74 | HR 72 | Temp 97.7°F | Resp 16 | Ht 66.0 in | Wt 187.0 lb

## 2015-10-26 DIAGNOSIS — Z23 Encounter for immunization: Secondary | ICD-10-CM | POA: Diagnosis not present

## 2015-10-26 DIAGNOSIS — M25552 Pain in left hip: Secondary | ICD-10-CM

## 2015-10-26 DIAGNOSIS — I10 Essential (primary) hypertension: Secondary | ICD-10-CM

## 2015-10-26 DIAGNOSIS — L304 Erythema intertrigo: Secondary | ICD-10-CM

## 2015-10-26 DIAGNOSIS — E785 Hyperlipidemia, unspecified: Secondary | ICD-10-CM | POA: Diagnosis not present

## 2015-10-26 LAB — COMPREHENSIVE METABOLIC PANEL
ALBUMIN: 4.7 g/dL (ref 3.6–5.1)
ALT: 12 U/L (ref 6–29)
AST: 28 U/L (ref 10–35)
Alkaline Phosphatase: 91 U/L (ref 33–130)
BUN: 10 mg/dL (ref 7–25)
CALCIUM: 9.6 mg/dL (ref 8.6–10.4)
CO2: 19 mmol/L — ABNORMAL LOW (ref 20–31)
Chloride: 105 mmol/L (ref 98–110)
Creat: 0.78 mg/dL (ref 0.50–0.99)
GLUCOSE: 77 mg/dL (ref 70–99)
POTASSIUM: 4.2 mmol/L (ref 3.5–5.3)
Sodium: 138 mmol/L (ref 135–146)
Total Bilirubin: 0.6 mg/dL (ref 0.2–1.2)
Total Protein: 7.8 g/dL (ref 6.1–8.1)

## 2015-10-26 LAB — LIPID PANEL
CHOLESTEROL: 183 mg/dL (ref 125–200)
HDL: 54 mg/dL (ref >=46)
LDL Cholesterol: 100 mg/dL (ref <130)
TRIGLYCERIDES: 144 mg/dL (ref <150)
Total CHOL/HDL Ratio: 3.4 Ratio (ref <=5.0)
VLDL: 29 mg/dL (ref <30)

## 2015-10-26 MED ORDER — CLOTRIMAZOLE-BETAMETHASONE 1-0.05 % EX CREA: 1.0000 "application " | TOPICAL_CREAM | Freq: Two times a day (BID) | CUTANEOUS | Status: DC

## 2015-10-26 NOTE — Assessment & Plan Note (Signed)
BP borderline elevated today, restart losartan

## 2015-10-26 NOTE — Telephone Encounter (Signed)
Ok to reorder

## 2015-10-26 NOTE — Patient Instructions (Addendum)
Continue current medications We will call with lab results Flu shot given Use the cream twice a day  F/U 6 months

## 2015-10-26 NOTE — Progress Notes (Signed)
Patient ID: Lindsey Mullins, female   DOB: 01/23/1946, 69 y.o.   MRN: [id number]   Subjective:    Patient ID: Lindsey Mullins, female    DOB: 1946/05/24, 70 y.o.   MRN: [id number]  Patient presents for L Hip Pain   Hyperlipdemia- statin increased to '40mg'$  by cardiology in June, has not had recheck HTN- BP has been a little high, cardiology added losartan '25mg'$ , she has been out past few days, she is taking Coreg as prescribed Left hip and lower back pain for past few months. 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 Her pain now is in same area where she fell. She takes tylenol 1-2 times a day which controls pain, at times she gets a shootting pain from buttocks to lateral aspect of thigh but it does not go past her knee.  No change in bowel or bladder  Moist itchy rash beneath breast, came after her Mammogram, used hydrocortisone  Meds reviewed Due for flu shot     Review Of Systems:  GEN- denies fatigue, fever, weight loss,weakness, recent illness HEENT- denies eye drainage, change in vision, nasal discharge, CVS- denies chest pain, palpitations RESP- denies SOB, cough, wheeze ABD- denies N/V, change in stools, abd pain GU- denies dysuria, hematuria, dribbling, incontinence MSK-+ joint pain, muscle aches, injury Neuro- denies headache, dizziness, syncope, seizure activity       Objective:    BP 140/74 mmHg  Pulse 72  Temp(Src) 97.7 F (36.5 C) (Oral)  Resp 16  Ht '5\' 6"'$  (1.676 m)  Wt 187 lb (84.823 kg)  BMI 30.20 kg/m2 GEN- NAD, alert and oriented x3 HEENT- PERRL, EOMI, non injected sclera, pink conjunctiva, MMM, oropharynx clear CVS- RRR, no murmur RESP-CTAB MSK- Spine NT, Decreased ROM lumbar spine and bilat hips, Hip NT, neg hip rock, neg SLR, non antalgic gait Neuro- normal tone LE, strength equal bilat, sensation in tact LE EXT- No edema Pulses- Radial, - 2+        Assessment & Plan:       Problem List Items Addressed This Visit    Hyperlipidemia    Recheck lipids and LFT      Relevant Orders   Lipid panel   Essential hypertension, benign - Primary    BP borderline elevated today, restart losartan       Relevant Orders   Comprehensive metabolic panel   CBC with Differential/Platelet    Other Visit Diagnoses    Need for prophylactic vaccination and inoculation against influenza        Relevant Orders    Flu Vaccine QUAD 36+ mos PF IM (Fluarix & Fluzone Quad PF) (Completed)    Left hip pain        Pain  mostly likley due to DJD, had early changes 6 years ago on xray, no recent fall, obtain xray, discussed meds she wants to avoid, also wants to avoid injections so PT may be useful    Relevant Orders    DG Lumbar Spine 2-3 Views    DG HIP UNILAT W OR W/O PELVIS 2-3 VIEWS LEFT    Intertrigo    - given lotrisone       Note: This dictation was prepared with Dragon dictation along with smaller Company secretary. Any transcriptional errors that result from this process are unintentional.

## 2015-10-26 NOTE — Telephone Encounter (Signed)
Pt called and would like to know if we can update her handicap sticker.  Please call [phone] when ready.

## 2015-10-26 NOTE — Telephone Encounter (Signed)
okay

## 2015-10-26 NOTE — Assessment & Plan Note (Signed)
Recheck lipids and LFT

## 2015-10-26 NOTE — Telephone Encounter (Signed)
Application printed.   Patient made aware to pick up after lunch.

## 2015-10-27 ENCOUNTER — Other Ambulatory Visit: Payer: Self-pay | Admitting: *Deleted

## 2015-10-27 DIAGNOSIS — M1610 Unilateral primary osteoarthritis, unspecified hip: Secondary | ICD-10-CM

## 2015-10-27 LAB — CBC WITH DIFFERENTIAL/PLATELET

## 2015-11-03 ENCOUNTER — Other Ambulatory Visit: Payer: Self-pay | Admitting: *Deleted

## 2015-11-29 ENCOUNTER — Encounter: Payer: Self-pay | Admitting: Family Medicine

## 2015-11-29 ENCOUNTER — Other Ambulatory Visit: Payer: Self-pay | Admitting: Family Medicine

## 2015-11-29 NOTE — Telephone Encounter (Signed)
Refill denied, cyclobenzaprine.  Last refill indicates pt NTBS.  Letter to patient

## 2015-12-05 ENCOUNTER — Telehealth: Payer: Self-pay | Admitting: *Deleted

## 2015-12-05 ENCOUNTER — Ambulatory Visit (HOSPITAL_COMMUNITY): Payer: Medicare Other | Admitting: Physical Therapy

## 2015-12-05 MED ORDER — CYCLOBENZAPRINE HCL 5 MG PO TABS: ORAL_TABLET | ORAL | Status: DC

## 2015-12-05 NOTE — Telephone Encounter (Signed)
ok 

## 2015-12-05 NOTE — Telephone Encounter (Signed)
Prescription sent to pharmacy.

## 2015-12-05 NOTE — Telephone Encounter (Signed)
Received fax requesting refill on Flexeril.   Ok to refill?

## 2015-12-15 ENCOUNTER — Ambulatory Visit (HOSPITAL_COMMUNITY): Payer: Medicare Other | Attending: Family Medicine | Admitting: Physical Therapy

## 2015-12-15 DIAGNOSIS — R29898 Other symptoms and signs involving the musculoskeletal system: Secondary | ICD-10-CM | POA: Insufficient documentation

## 2015-12-15 DIAGNOSIS — M5410 Radiculopathy, site unspecified: Secondary | ICD-10-CM | POA: Diagnosis not present

## 2015-12-15 DIAGNOSIS — R6889 Other general symptoms and signs: Secondary | ICD-10-CM | POA: Diagnosis present

## 2015-12-15 DIAGNOSIS — M2560 Stiffness of unspecified joint, not elsewhere classified: Secondary | ICD-10-CM | POA: Diagnosis present

## 2015-12-15 DIAGNOSIS — R269 Unspecified abnormalities of gait and mobility: Secondary | ICD-10-CM | POA: Insufficient documentation

## 2015-12-15 NOTE — Patient Instructions (Addendum)
Backward Bend (Standing)    Arch backward to make hollow of back deeper. Hold _2___ seconds. Repeat _10___ times per set. Do 1____ sets per session. Do ___2_ sessions per day.  http://orth.exer.us/178   Copyright  VHI. All rights reserved.  Thoracolumbar Side-Bend: Single Arm (Standing)    Reach over head to other side with right arm until stretch is felt. Hold _2___ seconds. Relax. Repeat __10__ times per set. Do 1____ sets per session. Do _3___ sessions per day.  http://orth.exer.us/262   Copyright  VHI. All rights reserved.  Knee-to-Chest Stretch: Unilateral    With hand behind right knee, pull knee in to chest until a comfortable stretch is felt in lower back and buttocks. Keep back relaxed. Hold _20___ seconds. Repeat _3___ times per set. Do __1__ sets per session. Do __2__ sessions per day.  http://orth.exer.us/126   Copyright  VHI. All rights reserved.  Stretching: Hamstring (Supine)    Supporting right thigh behind knee, slowly straighten knee until stretch is felt in back of thigh. Hold _20___ seconds. Repeat __1__ times per set. Do _2___ sets per session. Do ____ sessions per day.  http://orth.exer.us/656   Copyright  VHI. All rights reserved.  Isometric Abdominal    Lying on back with knees bent, tighten stomach by pressing elbows down. Hold ___5_ seconds. Repeat __10__ times per set. Do _1___ sets per session. Do __5__ sessions per day.  http://orth.exer.us/1086   Copyright  VHI. All rights reserved.
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
## 2016-01-03 ENCOUNTER — Ambulatory Visit: Payer: Medicare Other | Admitting: Cardiovascular Disease

## 2016-01-06 ENCOUNTER — Ambulatory Visit (INDEPENDENT_AMBULATORY_CARE_PROVIDER_SITE_OTHER): Payer: Medicare Other | Admitting: Family Medicine

## 2016-01-06 ENCOUNTER — Encounter: Payer: Self-pay | Admitting: Family Medicine

## 2016-01-06 VITALS — BP 134/68 | HR 82 | Temp 99.3°F | Resp 16 | Ht 66.0 in | Wt 194.0 lb

## 2016-01-06 DIAGNOSIS — K219 Gastro-esophageal reflux disease without esophagitis: Secondary | ICD-10-CM

## 2016-01-06 DIAGNOSIS — J209 Acute bronchitis, unspecified: Secondary | ICD-10-CM | POA: Diagnosis not present

## 2016-01-06 LAB — INFLUENZA A AND B AG, IMMUNOASSAY
Influenza A Antigen: NOT DETECTED
Influenza B Antigen: NOT DETECTED

## 2016-01-06 MED ORDER — BENZONATATE 100 MG PO CAPS: 100.0000 mg | ORAL_CAPSULE | Freq: Two times a day (BID) | ORAL | Status: DC | PRN

## 2016-01-06 MED ORDER — DEXLANSOPRAZOLE 60 MG PO CPDR
60.0000 mg | DELAYED_RELEASE_CAPSULE | Freq: Every day | ORAL | Status: DC
Start: 2016-01-06 — End: 2016-01-11

## 2016-01-06 MED ORDER — AZITHROMYCIN 250 MG PO TABS: ORAL_TABLET | ORAL | Status: DC

## 2016-01-06 MED ORDER — CYCLOBENZAPRINE HCL 5 MG PO TABS: ORAL_TABLET | ORAL | Status: DC

## 2016-01-06 NOTE — Progress Notes (Signed)
Patient ID: Lindsey Mullins, female   DOB: 03-04-1946, 70 y.o.   MRN: [id number]   Subjective:    Patient ID: Lindsey Mullins, female    DOB: 1946/06/22, 70 y.o.   MRN: [id number]  Patient presents for Illness and Indigestion  patient here with cough with production worsening over the past week. Low-grade fever body aches and fatigue. Last week she was on steroid Dosepak she states that she finished this she started getting more mucus up. She was given this because of some pain by orthopedics. She has been using over-the-counter cough medicine as well as her allergy meds.   She also complains of worsening acid reflux. She was not able to work Nexium. She's been taking Zantac as well as Tums and these have not been helping. She has not had any vomiting or change in her bowels.    Review Of Systems:  GEN- denies fatigue,+ fever, weight loss,weakness, recent illness HEENT- denies eye drainage, change in vision, +nasal discharge, CVS- denies chest pain, palpitations RESP- denies SOB, +cough, wheeze ABD- denies N/V, change in stools, abd pain GU- denies dysuria, hematuria, dribbling, incontinence MSK- denies joint pain, muscle aches, injury Neuro- + headache, denies dizziness, syncope, seizure activity       Objective:    BP 134/68 mmHg  Pulse 82  Temp(Src) 99.3 F (37.4 C) (Oral)  Resp 16  Ht '5\' 6"'$  (1.676 m)  Wt 194 lb (87.998 kg)  BMI 31.33 kg/m2  SpO2 98% GEN- NAD, alert and oriented x3 HEENT- PERRL, EOMI, non injected sclera, pink conjunctiva, MMM, oropharynx clear Neck- Supple, no LAD CVS- RRR, no murmur RESP-CTAB,harsh cough  ABD-NABS,soft,NT,ND EXT- No edema Pulses- Radial,- 2+  FLU- NEGATIVE       Assessment & Plan:      Problem List Items Addressed This Visit    GERD (gastroesophageal reflux disease)    Trial of dexilant, given samples of '60mg'$  from office      Relevant Medications   dexlansoprazole (DEXILANT) 60 MG capsule    Other Visit Diagnoses    Acute bronchitis, unspecified organism    -  Primary    based on age, comorbidties, given zpak, tessalon perrles , mucinex    Relevant Orders    Influenza A and B Ag, Immunoassay (Completed)       Note: This dictation was prepared with Dragon dictation along with smaller phrase technology. Any transcriptional errors that result from this process are unintentional.

## 2016-01-06 NOTE — Patient Instructions (Signed)
Take antibiotics as prescribed.

## 2016-01-08 ENCOUNTER — Encounter: Payer: Self-pay | Admitting: Family Medicine

## 2016-01-08 NOTE — Assessment & Plan Note (Signed)
Trial of dexilant, given samples of '60mg'$  from office

## 2016-01-10 ENCOUNTER — Observation Stay (HOSPITAL_COMMUNITY)
Admission: EM | Admit: 2016-01-10 | Discharge: 2016-01-11 | Disposition: A | Discharge status: Home / Self Care | Payer: Medicare Other | Attending: Internal Medicine | Admitting: Internal Medicine

## 2016-01-10 ENCOUNTER — Encounter (HOSPITAL_COMMUNITY): Payer: Self-pay

## 2016-01-10 ENCOUNTER — Emergency Department (HOSPITAL_COMMUNITY): Payer: Medicare Other

## 2016-01-10 DIAGNOSIS — Z7982 Long term (current) use of aspirin: Secondary | ICD-10-CM | POA: Diagnosis not present

## 2016-01-10 DIAGNOSIS — K219 Gastro-esophageal reflux disease without esophagitis: Secondary | ICD-10-CM | POA: Diagnosis not present

## 2016-01-10 DIAGNOSIS — I779 Disorder of arteries and arterioles, unspecified: Secondary | ICD-10-CM

## 2016-01-10 DIAGNOSIS — I739 Peripheral vascular disease, unspecified: Secondary | ICD-10-CM

## 2016-01-10 DIAGNOSIS — M79602 Pain in left arm: Secondary | ICD-10-CM | POA: Diagnosis present

## 2016-01-10 DIAGNOSIS — E785 Hyperlipidemia, unspecified: Secondary | ICD-10-CM | POA: Insufficient documentation

## 2016-01-10 DIAGNOSIS — Z79899 Other long term (current) drug therapy: Secondary | ICD-10-CM | POA: Insufficient documentation

## 2016-01-10 DIAGNOSIS — R079 Chest pain, unspecified: Secondary | ICD-10-CM | POA: Diagnosis not present

## 2016-01-10 DIAGNOSIS — Z8673 Personal history of transient ischemic attack (TIA), and cerebral infarction without residual deficits: Secondary | ICD-10-CM | POA: Insufficient documentation

## 2016-01-10 DIAGNOSIS — R0789 Other chest pain: Secondary | ICD-10-CM

## 2016-01-10 DIAGNOSIS — Z87891 Personal history of nicotine dependence: Secondary | ICD-10-CM | POA: Insufficient documentation

## 2016-01-10 DIAGNOSIS — G2581 Restless legs syndrome: Secondary | ICD-10-CM | POA: Insufficient documentation

## 2016-01-10 DIAGNOSIS — I672 Cerebral atherosclerosis: Secondary | ICD-10-CM | POA: Insufficient documentation

## 2016-01-10 DIAGNOSIS — Z7952 Long term (current) use of systemic steroids: Secondary | ICD-10-CM | POA: Diagnosis not present

## 2016-01-10 DIAGNOSIS — Z880 Allergy status to penicillin: Secondary | ICD-10-CM | POA: Diagnosis not present

## 2016-01-10 DIAGNOSIS — I10 Essential (primary) hypertension: Secondary | ICD-10-CM | POA: Diagnosis not present

## 2016-01-10 LAB — BASIC METABOLIC PANEL
Anion gap: 10 (ref 5–15)
BUN: 12 mg/dL (ref 6–20)
CHLORIDE: 107 mmol/L (ref 101–111)
CO2: 23 mmol/L (ref 22–32)
Calcium: 8.7 mg/dL — ABNORMAL LOW (ref 8.9–10.3)
Creatinine, Ser: 0.78 mg/dL (ref 0.44–1.00)
GFR calc Af Amer: >60 mL/min (ref >60)
GFR calc non Af Amer: >60 mL/min (ref >60)
GLUCOSE: 91 mg/dL (ref 65–99)
POTASSIUM: 4.1 mmol/L (ref 3.5–5.1)
SODIUM: 140 mmol/L (ref 135–145)

## 2016-01-10 LAB — CBC
HCT: 36.2 % (ref 36.0–46.0)
Hemoglobin: 11.8 g/dL — ABNORMAL LOW (ref 12.0–15.0)
MCH: 31.2 pg (ref 26.0–34.0)
MCHC: 32.6 g/dL (ref 30.0–36.0)
MCV: 95.8 fL (ref 78.0–100.0)
Platelets: 250 10*3/uL (ref 150–400)
RBC: 3.78 MIL/uL — ABNORMAL LOW (ref 3.87–5.11)
RDW: 13.3 % (ref 11.5–15.5)
WBC: 10.9 10*3/uL — AB (ref 4.0–10.5)

## 2016-01-10 LAB — TROPONIN I

## 2016-01-10 MED ORDER — ONDANSETRON HCL 4 MG/2ML IJ SOLN: 4.0000 mg | Freq: Four times a day (QID) | INTRAMUSCULAR | Status: DC | PRN

## 2016-01-10 MED ORDER — SODIUM CHLORIDE 0.9 % IV SOLN: 250.0000 mL | INTRAVENOUS | Status: DC | PRN

## 2016-01-10 MED ORDER — ENOXAPARIN SODIUM 40 MG/0.4ML ~~LOC~~ SOLN
40.0000 mg | SUBCUTANEOUS | Status: DC
  Administered 2016-01-11: 40 mg via SUBCUTANEOUS
  Filled 2016-01-10: qty 0.4

## 2016-01-10 MED ORDER — CARVEDILOL 3.125 MG PO TABS
3.1250 mg | ORAL_TABLET | Freq: Every day | ORAL | Status: DC
  Administered 2016-01-11: 3.125 mg via ORAL
  Filled 2016-01-10: qty 1

## 2016-01-10 MED ORDER — AZITHROMYCIN 250 MG PO TABS
250.0000 mg | ORAL_TABLET | Freq: Every day | ORAL | Status: AC
  Administered 2016-01-11: 250 mg via ORAL
  Filled 2016-01-10: qty 1

## 2016-01-10 MED ORDER — ADULT MULTIVITAMIN W/MINERALS CH
1.0000 | ORAL_TABLET | Freq: Every day | ORAL | Status: DC
  Administered 2016-01-11: 1 via ORAL
  Filled 2016-01-10: qty 1

## 2016-01-10 MED ORDER — ONDANSETRON HCL 4 MG/2ML IJ SOLN
4.0000 mg | Freq: Once | INTRAMUSCULAR | Status: AC
  Administered 2016-01-10: 4 mg via INTRAVENOUS
  Filled 2016-01-10: qty 2

## 2016-01-10 MED ORDER — LOSARTAN POTASSIUM 50 MG PO TABS: 50.0000 mg | ORAL_TABLET | Freq: Every evening | ORAL | Status: DC

## 2016-01-10 MED ORDER — CYCLOBENZAPRINE HCL 10 MG PO TABS
5.0000 mg | ORAL_TABLET | Freq: Three times a day (TID) | ORAL | Status: DC | PRN
  Administered 2016-01-11: 5 mg via ORAL
  Filled 2016-01-10: qty 1

## 2016-01-10 MED ORDER — ACETAMINOPHEN 325 MG PO TABS: 650.0000 mg | ORAL_TABLET | ORAL | Status: DC | PRN

## 2016-01-10 MED ORDER — SODIUM CHLORIDE 0.9% FLUSH
3.0000 mL | Freq: Two times a day (BID) | INTRAVENOUS | Status: DC
  Administered 2016-01-11 (×2): 3 mL via INTRAVENOUS

## 2016-01-10 MED ORDER — NITROGLYCERIN 0.4 MG SL SUBL: 0.4000 mg | SUBLINGUAL_TABLET | SUBLINGUAL | Status: DC | PRN

## 2016-01-10 MED ORDER — HYDRALAZINE HCL 20 MG/ML IJ SOLN: 10.0000 mg | INTRAMUSCULAR | Status: DC | PRN

## 2016-01-10 MED ORDER — ASPIRIN 81 MG PO CHEW
324.0000 mg | CHEWABLE_TABLET | Freq: Once | ORAL | Status: AC
  Administered 2016-01-10: 324 mg via ORAL
  Filled 2016-01-10: qty 4

## 2016-01-10 MED ORDER — SIMVASTATIN 20 MG PO TABS
40.0000 mg | ORAL_TABLET | Freq: Every evening | ORAL | Status: DC
  Administered 2016-01-11: 40 mg via ORAL
  Filled 2016-01-10: qty 2

## 2016-01-10 MED ORDER — LORATADINE 10 MG PO TABS
10.0000 mg | ORAL_TABLET | Freq: Every day | ORAL | Status: DC
  Administered 2016-01-11: 10 mg via ORAL
  Filled 2016-01-10: qty 1

## 2016-01-10 MED ORDER — PANTOPRAZOLE SODIUM 40 MG PO TBEC
40.0000 mg | DELAYED_RELEASE_TABLET | Freq: Every day | ORAL | Status: DC
  Administered 2016-01-11: 40 mg via ORAL
  Filled 2016-01-10: qty 1

## 2016-01-10 MED ORDER — METOPROLOL TARTRATE 25 MG PO TABS
25.0000 mg | ORAL_TABLET | Freq: Two times a day (BID) | ORAL | Status: AC
  Administered 2016-01-11: 25 mg via ORAL
  Filled 2016-01-10: qty 1

## 2016-01-10 MED ORDER — SODIUM CHLORIDE 0.9% FLUSH: 3.0000 mL | INTRAVENOUS | Status: DC | PRN

## 2016-01-10 MED ORDER — ASPIRIN EC 81 MG PO TBEC: 81.0000 mg | DELAYED_RELEASE_TABLET | Freq: Every evening | ORAL | Status: DC

## 2016-01-10 MED ORDER — OMEGA-3-ACID ETHYL ESTERS 1 G PO CAPS
1.0000 | ORAL_CAPSULE | Freq: Every day | ORAL | Status: DC
  Administered 2016-01-11: 1 g via ORAL
  Filled 2016-01-10: qty 1

## 2016-01-10 MED ORDER — NITROGLYCERIN 0.4 MG SL SUBL
0.4000 mg | SUBLINGUAL_TABLET | SUBLINGUAL | Status: DC | PRN
Start: 2016-01-10 — End: 2016-01-10
  Filled 2016-01-10: qty 1

## 2016-01-10 NOTE — H&P (Signed)
[facility] History and Physical  Lindsey Mullins VFI:[id number] DOB: 1946-03-27 DOA: 01/10/2016  Referring physician: ED physician PCP: Vic Blackbird, MD  Specialists:  Dr. Gwenlyn Found (cardiology)   Chief Complaint:  Chest pain  HPI: Lindsey Mullins is a 70 y.o. female with PMH of hypertension, hyperlipidemia, and carotid artery stenosis status post left-sided endarterectomy in 2006 and presents to the ED with acute onset chest discomfort. Patient was in her usual state of health, completing her work day without incident, and was driving home with she experienced acute onset burning sensation across the front of her chest with radiation to the left arm. Symptoms resolved spontaneously over the course of several minutes and were not associated with diaphoresis, nausea, or dyspnea. Patient has had similar symptoms many years ago. Symptoms have since recurred 2 more times this evening, again resolving each time without any specific intervention. Patient denies any recent fever, chills, cough, palpitations, or edema. There is been no recent change in her medications and no trauma. She does endorse a history of GERD, but describes the current symptoms is much different than those which have been attributed to GERD in the past. There is no headache, dizziness, focal numbness or weakness, or loss of coordination. There is no associated vision change. Patient's husband is at the bedside and notes that Lindsey Mullins has only been sleeping approximately 4 hours each night for the last several months as she works full time and then spends several hours every evening caring for her chronically ill mother. Patient is followed by Dr. Gwenlyn Found in the outpatient setting for hypertension and carotid artery disease. She underwent a carotid ultrasound in May 2016 with the endarterectomy site widely patent and noncritical stenosis at the ipsilateral ICA. Last stress test was performed with Myoview in 2013 and was interpreted  as normal.  In ED, patient was found to be afebrile, saturating well on room air, with blood pressure in the 180/100 range, but vitals otherwise stable. EKG featured in normal sinus rhythm and was essentially normal. Chest x-ray was negative for acute cardiopulmonary disease. Initial troponin is undetectable. CMP is largely unremarkable and CBC notable for a leukocytosis to 11,000 and a stable chronic normocytic anemia with hemoglobin of 11.8. Patient was given a 324 mg aspirin chew when the ED, offered nitroglycerin which she refused, and was monitored on telemetry with no arrhythmias. She remained hemodynamically stable in the emergency department and will admitted to the telemetry unit for ongoing evaluation and management of chest discomfort concerning for possible ACS.  Where does patient live?   At home     Can patient participate in ADLs?  Yes       Review of Systems:   General: no fevers, chills, sweats, weight change, poor appetite, or fatigue HEENT: no blurry vision, hearing changes or sore throat Pulm: no dyspnea, cough, or wheeze CV: no palpitations. "Burning" chest discomfort radiating to Lt arm Abd: no nausea, vomiting, abdominal pain, diarrhea, or constipation GU: no dysuria, hematuria, increased urinary frequency, or urgency  Ext: no leg edema Neuro: no focal weakness, numbness, or tingling, no vision change or hearing loss Skin: no rash, no wounds MSK: No muscle spasm, no deformity, no red, hot, or swollen joint Heme: No easy bruising or bleeding Travel history: No recent long distant travel    Allergy:  Allergies  Allergen Reactions  . Codeine   . Lipitor [Atorvastatin] Other (See Comments)    Myalgias  . Lisinopril Cough  . Penicillins   .  Pravastatin Other (See Comments)    Myalgias    Past Medical History  Diagnosis Date  . Hypertension   . Hyperlipidemia   . Restless leg syndrome   . GERD (gastroesophageal reflux disease)   . Cerebral atherosclerosis  09/29/2012    CAROTID DUPLEX - RIGHT  BULB/PROXIMAL ICA-moderate amount of fibrous plaque, 50-69% diameter reduction; LEFT CEA-normal, no significant diameter reduction  . Atypical chest pain 05/23/2012    STRESS TEST - small to moderate sized area of partial reversibility of the anteroapical wall, most likely breast artifact, post-stress EF 67%, EKG show NSR at 65, No Lexiscan EKG changes, non-diagnostic for ischemia; STRESS TEST, 01/25/2010 - normal study, post-stress EF 66%, no significant ischemia  . TIA (transient ischemic attack) 01/27/2010    2D ECHO - EF 65%, normal  . Shortness of breath 03/30/2005    2D ECHO - EF >55%, normal    Past Surgical History  Procedure Laterality Date  . Abdominal exploration surgery    . Carotid endarterectomy    . Peripheral vascular catheterization  07/16/2005    Right verterbral-50% smooth segmental badsilar artery stenosis on right side, Right carotid-50% ulcerated proxmial stenosis, Left carotid-60 to 70% left externam carotid artery stenosis, 90% proximal left ICA stenosis    Social History:  reports that she quit smoking about 32 years ago. She has never used smokeless tobacco. She reports that she does not drink alcohol or use illicit drugs.  Family History:  Family History  Problem Relation Age of Onset  . Heart disease Mother   . Hypertension Mother   . Hypertension Father   . Heart disease Father      Prior to Admission medications   Medication Sig Start Date End Date Taking? Authorizing Provider  aspirin 81 MG tablet Take 81 mg by mouth every evening.    Yes Historical Provider, MD  azithromycin (ZITHROMAX) 250 MG tablet Take 2 tablets x 1 day, then 1 tab daily for 4 days Patient taking differently: Take 250-500 mg by mouth See admin instructions. Take 2 tablets x 1 day, then 1 tab daily for 4 days 01/06/16  Yes [name], MD  benzonatate (TESSALON) 100 MG capsule Take 1 capsule (100 mg total) by mouth 2 (two) times daily as needed for  cough. 01/06/16  Yes [name], MD  carvedilol (COREG) 3.125 MG tablet TAKE ONE TABLET BY MOUTH ONCE DAILY 09/16/15  Yes [name], MD  clotrimazole-betamethasone (LOTRISONE) cream Apply 1 application topically 2 (two) times daily. Patient taking differently: Apply 1 application topically daily as needed.  10/26/15  Yes [name], MD  cyclobenzaprine (FLEXERIL) 5 MG tablet TAKE ONE TABLET BY MOUTH AT BEDTIME AS NEEDED FOR MUSCLE SPASM AND LEG CRAMPS 01/06/16  Yes [name], MD  dexlansoprazole (DEXILANT) 60 MG capsule Take 1 capsule (60 mg total) by mouth daily. 01/06/16  Yes [name], MD  loratadine (CLARITIN) 10 MG tablet Take 10 mg by mouth daily.   Yes Historical Provider, MD  losartan (COZAAR) 50 MG tablet Take 1 tablet (50 mg total) by mouth daily. Patient taking differently: Take 50 mg by mouth every evening.  05/17/15  Yes Lorretta Harp, MD  Multiple Vitamin (MULTIVITAMIN) capsule Take 1 capsule by mouth daily.   Yes Historical Provider, MD  NITROSTAT 0.4 MG SL tablet DISSOLVE ONE TABLET UNDER THE TONGUE AS NEEDED FOR CHEST PAIN**MAX 3 TABLETS EACH 5 MINUTES APART** 02/15/14  Yes Lorretta Harp, MD  Omega-3  Krill Oil 500 MG CAPS Take 1 capsule by mouth daily.   Yes Historical Provider, MD  simvastatin (ZOCOR) 40 MG tablet Take 1 tablet (40 mg total) by mouth every evening. 06/24/15  Yes Lorretta Harp, MD    Physical Exam: Filed Vitals:   01/10/16 1817 01/10/16 2130  BP: 160/101 184/102  Pulse: 87 78  Temp: 97.8 F (36.6 C)   TempSrc: Oral   Resp: 18 17  Height: '5\' 6"'$  (1.676 m)   Weight: 86.183 kg (190 lb)   SpO2: 100% 100%   General: Not in acute distress HEENT:       Eyes: PERRL, EOMI, no scleral icterus or conjunctival pallor.       ENT: No discharge from the ears or nose, no pharyngeal ulcers, petechiae or exudate.        Neck: No JVD, no bruit, no appreciable mass Heme: No cervical adenopathy, no pallor Cardiac: S1/S2, RRR, No murmurs,  No gallops or rubs. Pulm: Good air movement bilaterally. No rales, wheezing, rhonchi or rubs. Abd: Soft, nondistended, nontender, no rebound pain or gaurding, no mass or organomegaly, BS present. Ext: No LE edema bilaterally. 2+DP/PT pulse bilaterally. Musculoskeletal: No gross deformity, no red, hot, swollen joints, no limitation in ROM  Skin: No rashes or wounds on exposed surfaces  Neuro: Alert, oriented X3, cranial nerves II-XII grossly intact. No focal findings Psych: Patient is not overtly psychotic, appropriate mood and affect.  Labs on Admission:  Basic Metabolic Panel:  Recent Labs Lab 01/10/16 2011  NA 140  K 4.1  CL 107  CO2 23  GLUCOSE 91  BUN 12  CREATININE 0.78  CALCIUM 8.7*   Liver Function Tests: No results for input(s): AST, ALT, ALKPHOS, BILITOT, PROT, ALBUMIN in the last 168 hours. No results for input(s): LIPASE, AMYLASE in the last 168 hours. No results for input(s): AMMONIA in the last 168 hours. CBC:  Recent Labs Lab 01/10/16 2011  WBC 10.9*  HGB 11.8*  HCT 36.2  MCV 95.8  PLT 250   Cardiac Enzymes:  Recent Labs Lab 01/10/16 2011  TROPONINI <0.03    BNP (last 3 results) No results for input(s): BNP in the last 8760 hours.  ProBNP (last 3 results) No results for input(s): PROBNP in the last 8760 hours.  CBG: No results for input(s): GLUCAP in the last 168 hours.  Radiological Exams on Admission: Dg Chest 2 View  01/10/2016  CLINICAL DATA:  Chest pain EXAM: CHEST  2 VIEW COMPARISON:  07/06/2013 FINDINGS: Normal heart size. Lungs clear. No pneumothorax. No pleural effusion. IMPRESSION: No active cardiopulmonary disease. Electronically Signed   By: Marybelle Killings M.D.   On: 01/10/2016 19:29    EKG: Independently reviewed.  Abnormal findings:  Normal sinus rhythm  Assessment/Plan  1. Chest pain - Atypical in that it occurs at rest; concerning features include location, radiation to left arm, and comorbid HTN, HLD, and known  atherosclerotic carotid disease  - Initial EKG and troponin are reassuring  - ASA 324 mg chew given in ED; statin and beta-blocker given on the floor for possible ACS  - Monitor on telemetry overnight  - Obtain serial troponin measurements  - Repeat EKG in am, sooner for recurrence in symptoms  - Continue to optimize risk factors of HTN and HLD as below   - Continue PPI to suppress confounding reflux symptoms   2. ASCVD  - Pt has known carotid atherosclerotic disease and is s/p Lt CEA in 2006  - Continue  daily ASA 81, Zocor, ARB, beta-blocker  - Monitoring for acute disease as above    3. Hyperlipidemia  - Total chol 183, LDL 100, HDL 54 on 10/26/2015  - Continue Zocor, heart-healthy diet   4. Hypertension  - Elevated to 180/100 range currently  - Metoprolol IR 25 mg PO now  - Continue home-dose Coreg and losartan in the am  - Hydralazine 10 mg IVP prn SBP >180, or DBP >100  - Will use nitro if CP recurs      DVT ppx:  SQ Lovenox     Code Status: Full code Family Communication:  Yes, patient's husband at bed side Disposition Plan: Admit to inpatient   Date of Service 01/10/2016    Vianne Bulls, MD [facility] Pager [phone]  If 7PM-7AM, please contact night-coverage [email or web address] Password [email or web address] 01/10/2016, 11:42 PM
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
## 2016-01-10 NOTE — ED Provider Notes (Signed)
CSN: [id number]     Arrival date & time 01/10/16  1812 History   By signing my name below, I, Forrestine Him, attest that this documentation has been prepared under the direction and in the presence of Ezequiel Essex, MD.  Electronically Signed: Forrestine Him, ED Scribe. 01/10/2016. 9:11 PM.   Chief Complaint  Patient presents with  . arm pain    The history is provided by the patient. No language interpreter was used.    HPI Comments: AIMAR SHREWSBURY is a 70 y.o. female with a PMHx of HTN, hyperlipidemia, GERD, and TIA who presents to the Emergency Department complaining of intermittent, sudden onset, ongoing L sided chest pain that radiates down the L arm onset 30 minutes prior to arrival while driving . Pt describes pain as "hot and burning" with current episode starting 15 minutes ago. No aggravating or alleviating factors at this time. She also reports ongoing diaphoresis and shortness of breath. No OTC medications or home remedies attempted prior to arrival. No recent fever, chills, nausea, or vomiting.  No prior history of similar symptoms. Pt denies any stent placements. Most recent cardiac stress test in 2013.  PCP: Vic Blackbird, MD   CARDIOLOGIST: Quay Burow, MD  Past Medical History  Diagnosis Date  . Hypertension   . Hyperlipidemia   . Restless leg syndrome   . GERD (gastroesophageal reflux disease)   . Cerebral atherosclerosis 09/29/2012    CAROTID DUPLEX - RIGHT  BULB/PROXIMAL ICA-moderate amount of fibrous plaque, 50-69% diameter reduction; LEFT CEA-normal, no significant diameter reduction  . Atypical chest pain 05/23/2012    STRESS TEST - small to moderate sized area of partial reversibility of the anteroapical wall, most likely breast artifact, post-stress EF 67%, EKG show NSR at 65, No Lexiscan EKG changes, non-diagnostic for ischemia; STRESS TEST, 01/25/2010 - normal study, post-stress EF 66%, no significant ischemia  . TIA (transient ischemic attack) 01/27/2010   2D ECHO - EF 65%, normal  . Shortness of breath 03/30/2005    2D ECHO - EF >55%, normal   Past Surgical History  Procedure Laterality Date  . Abdominal exploration surgery    . Carotid endarterectomy    . Peripheral vascular catheterization  07/16/2005    Right verterbral-50% smooth segmental badsilar artery stenosis on right side, Right carotid-50% ulcerated proxmial stenosis, Left carotid-60 to 70% left externam carotid artery stenosis, 90% proximal left ICA stenosis   Family History  Problem Relation Age of Onset  . Heart disease Mother   . Hypertension Mother   . Hypertension Father   . Heart disease Father    Social History  Substance Use Topics  . Smoking status: Former Smoker    Quit date: 04/21/1983  . Smokeless tobacco: Never Used  . Alcohol Use: No   OB History    No data available     Review of Systems  A complete 10 system review of systems was obtained and all systems are negative except as noted in the HPI and PMH.    Allergies  Codeine; Lipitor; Lisinopril; Penicillins; and Pravastatin  Home Medications   Prior to Admission medications   Medication Sig Start Date End Date Taking? Authorizing Provider  aspirin 81 MG tablet Take 81 mg by mouth every evening.    Yes Historical Provider, MD  azithromycin (ZITHROMAX) 250 MG tablet Take 2 tablets x 1 day, then 1 tab daily for 4 days Patient taking differently: Take 250-500 mg by mouth See admin instructions. Take 2 tablets x 1  day, then 1 tab daily for 4 days 01/06/16  Yes [name], MD  benzonatate (TESSALON) 100 MG capsule Take 1 capsule (100 mg total) by mouth 2 (two) times daily as needed for cough. 01/06/16  Yes [name], MD  carvedilol (COREG) 3.125 MG tablet TAKE ONE TABLET BY MOUTH ONCE DAILY 09/16/15  Yes [name], MD  clotrimazole-betamethasone (LOTRISONE) cream Apply 1 application topically 2 (two) times daily. Patient taking differently: Apply 1 application topically daily as  needed.  10/26/15  Yes [name], MD  cyclobenzaprine (FLEXERIL) 5 MG tablet TAKE ONE TABLET BY MOUTH AT BEDTIME AS NEEDED FOR MUSCLE SPASM AND LEG CRAMPS 01/06/16  Yes [name], MD  dexlansoprazole (DEXILANT) 60 MG capsule Take 1 capsule (60 mg total) by mouth daily. 01/06/16  Yes [name], MD  loratadine (CLARITIN) 10 MG tablet Take 10 mg by mouth daily.   Yes Historical Provider, MD  losartan (COZAAR) 50 MG tablet Take 1 tablet (50 mg total) by mouth daily. Patient taking differently: Take 50 mg by mouth every evening.  05/17/15  Yes Lorretta Harp, MD  Multiple Vitamin (MULTIVITAMIN) capsule Take 1 capsule by mouth daily.   Yes Historical Provider, MD  NITROSTAT 0.4 MG SL tablet DISSOLVE ONE TABLET UNDER THE TONGUE AS NEEDED FOR CHEST PAIN**MAX 3 TABLETS EACH 5 MINUTES APART** 02/15/14  Yes Lorretta Harp, MD  Omega-3 Krill Oil 500 MG CAPS Take 1 capsule by mouth daily.   Yes Historical Provider, MD  simvastatin (ZOCOR) 40 MG tablet Take 1 tablet (40 mg total) by mouth every evening. 06/24/15  Yes Lorretta Harp, MD   Triage Vitals: BP 155/80 mmHg  Pulse 78  Temp(Src) 97.8 F (36.6 C) (Oral)  Resp 17  Ht '5\' 6"'$  (1.676 m)  Wt 190 lb (86.183 kg)  BMI 30.68 kg/m2  SpO2 100%   Physical Exam  Constitutional: She is oriented to person, place, and time. She appears well-developed and well-nourished. No distress.  Uncomfortable   HENT:  Head: Normocephalic and atraumatic.  Mouth/Throat: Oropharynx is clear and moist. No oropharyngeal exudate.  Eyes: Conjunctivae and EOM are normal. Pupils are equal, round, and reactive to light.  Neck: Normal range of motion. Neck supple.  No meningismus.  Cardiovascular: Normal rate, regular rhythm, normal heart sounds and intact distal pulses.   No murmur heard. Pulmonary/Chest: Effort normal and breath sounds normal. No respiratory distress. She exhibits no tenderness.  Abdominal: Soft. There is no tenderness. There is no rebound  and no guarding.  Musculoskeletal: Normal range of motion. She exhibits no edema or tenderness.  Neurological: She is alert and oriented to person, place, and time. No cranial nerve deficit. She exhibits normal muscle tone. Coordination normal.  No ataxia on finger to nose bilaterally. No pronator drift. 5/5 strength throughout. CN 2-12 intact.Equal grip strength. Sensation intact.   Skin: Skin is warm.  Psychiatric: She has a normal mood and affect. Her behavior is normal.  Nursing note and vitals reviewed.   ED Course  Procedures (including critical care time)  DIAGNOSTIC STUDIES: Oxygen Saturation is 100% on RA, Normal by my interpretation.    COORDINATION OF CARE: 9:10 PM- Will give ASA, Zofran, and Nitrostat. Will order CXR, BMP, CBC, Troponin I, and EKG. Discussed treatment plan with pt at bedside and pt agreed to plan.     10:15 PM- Notified pt she will be admitted for observation.  Labs Review Labs Reviewed  BASIC METABOLIC PANEL -  Abnormal; Notable for the following:    Calcium 8.7 (*)    All other components within normal limits  CBC - Abnormal; Notable for the following:    WBC 10.9 (*)    RBC 3.78 (*)    Hemoglobin 11.8 (*)    All other components within normal limits  TROPONIN I  BASIC METABOLIC PANEL  TROPONIN I  TROPONIN I  TROPONIN I  BRAIN NATRIURETIC PEPTIDE  HEMOGLOBIN A1C  LIPID PANEL    Imaging Review Dg Chest 2 View  01/10/2016  CLINICAL DATA:  Chest pain EXAM: CHEST  2 VIEW COMPARISON:  07/06/2013 FINDINGS: Normal heart size. Lungs clear. No pneumothorax. No pleural effusion. IMPRESSION: No active cardiopulmonary disease. Electronically Signed   By: Marybelle Killings M.D.   On: 01/10/2016 19:29   I have personally reviewed and evaluated these images and lab results as part of my medical decision-making.   EKG Interpretation   Date/Time:  Tuesday January 10 2016 18:24:33 EST Ventricular Rate:  75 PR Interval:  146 QRS Duration: 70 QT Interval:   402 QTC Calculation: 448 R Axis:   68 Text Interpretation:  Normal sinus rhythm Normal ECG No significant change  was found Confirmed by Wyvonnia Dusky  MD, Jaspal Pultz [phone]) on 01/10/2016 7:03:03  PM      MDM   Final diagnoses:  Chest pain, unspecified chest pain type   Episodes of burning in the chest revealed on the left arm associated with diaphoresis and nausea. Now resolved. Recurrent episodes while in the ED room however.  EKG is normal sinus rhythm. First troponin is negative. Patient given aspirin. She declines NTG.  Heart score is 5. Suspicious for ACS. CXR negative  Observation admission discussed with Dr. Myna Hidalgo.  I personally performed the services described in this documentation, which was scribed in my presence. The recorded information has been reviewed and is accurate.   Ezequiel Essex, MD 01/11/16 0110

## 2016-01-11 DIAGNOSIS — G2581 Restless legs syndrome: Secondary | ICD-10-CM | POA: Diagnosis not present

## 2016-01-11 DIAGNOSIS — K219 Gastro-esophageal reflux disease without esophagitis: Secondary | ICD-10-CM

## 2016-01-11 DIAGNOSIS — I10 Essential (primary) hypertension: Secondary | ICD-10-CM

## 2016-01-11 DIAGNOSIS — E785 Hyperlipidemia, unspecified: Secondary | ICD-10-CM | POA: Diagnosis not present

## 2016-01-11 DIAGNOSIS — R079 Chest pain, unspecified: Principal | ICD-10-CM

## 2016-01-11 LAB — LIPID PANEL
CHOLESTEROL: 157 mg/dL (ref 0–200)
HDL: 59 mg/dL (ref >40)
LDL Cholesterol: 79 mg/dL (ref 0–99)
TRIGLYCERIDES: 95 mg/dL (ref <150)
Total CHOL/HDL Ratio: 2.7 RATIO
VLDL: 19 mg/dL (ref 0–40)

## 2016-01-11 LAB — BASIC METABOLIC PANEL
Anion gap: 7 (ref 5–15)
BUN: 10 mg/dL (ref 6–20)
CHLORIDE: 107 mmol/L (ref 101–111)
CO2: 26 mmol/L (ref 22–32)
CREATININE: 0.76 mg/dL (ref 0.44–1.00)
Calcium: 8.6 mg/dL — ABNORMAL LOW (ref 8.9–10.3)
GFR calc non Af Amer: >60 mL/min (ref >60)
GLUCOSE: 89 mg/dL (ref 65–99)
Potassium: 4.2 mmol/L (ref 3.5–5.1)
Sodium: 140 mmol/L (ref 135–145)

## 2016-01-11 LAB — TROPONIN I
Troponin I: <0.03 ng/mL (ref <0.031)
Troponin I: <0.03 ng/mL (ref <0.031)

## 2016-01-11 LAB — BRAIN NATRIURETIC PEPTIDE: B Natriuretic Peptide: 18 pg/mL (ref 0.0–100.0)

## 2016-01-11 MED ORDER — RANITIDINE HCL 150 MG PO TABS: 150.0000 mg | ORAL_TABLET | Freq: Every day | ORAL | Status: DC

## 2016-01-11 MED ORDER — DEXLANSOPRAZOLE 60 MG PO CPDR: 60.0000 mg | DELAYED_RELEASE_CAPSULE | Freq: Two times a day (BID) | ORAL | Status: DC

## 2016-01-11 NOTE — Progress Notes (Signed)
IV removed, site WNL.  Pt given d/c instructions and new prescriptions.  Discussed all home medications (when, how, and why to take), patient verbalizes understanding. Discussed home care with patient, teachback completed. F/U appointment in place for tomorrow morning, pt states they will keep appointment. Pt is stable at this time. Pt taken to main entrance in wheelchair by staff member.

## 2016-01-11 NOTE — Discharge Summary (Signed)
Physician Discharge Summary  Lindsey Mullins:[id number] DOB: [birth year] DOA: 01/10/2016  PCP: Vic Blackbird, MD  Admit date: 01/10/2016 Discharge date: 01/11/2016  Time spent: 35 minutes  Recommendations for Outpatient Follow-up:  1. Repeat BMET to follow electrolytes and renal function 2. Reassess blood pressure and adjust antihypertensive regimen as needed  Discharge Diagnoses:  Chest pain: non cardiac and most likely secondary to reflux GERD Essential hypertension, benign Hyperlipidemia Carotid artery disease ([place])   Discharge Condition: Stable and improved. Patient has been discharged home in a stable condition with instructions to follow with PCP in approximately 10 days.  Diet recommendation: Heart healthy diet  Filed Weights   01/10/16 1817 01/10/16 2340 01/11/16 0431  Weight: 86.183 kg (190 lb) 86.183 kg (190 lb) 86.17 kg (189 lb 15.5 oz)    History of present illness:  70 y.o. female with PMH of hypertension, hyperlipidemia, and carotid artery stenosis status post left-sided endarterectomy in 2006 and presents to the ED with acute onset chest discomfort. Patient was in her usual state of health, completing her work day without incident, and was driving home with she experienced acute onset burning sensation across the front of her chest with radiation to the left arm. Symptoms resolved spontaneously over the course of several minutes and were not associated with diaphoresis, nausea, or dyspnea. Patient has had similar symptoms many years ago. Symptoms have since recurred 2 more times this evening, again resolving each time without any specific intervention. Patient denies any recent fever, chills, cough, palpitations, or edema. There is been no recent change in her medications and no trauma. She does endorse a history of GERD, but describes the current symptoms is much different than those which have been attributed to GERD in the past. There is no headache, dizziness,  focal numbness or weakness, or loss of coordination. There is no associated vision change. Patient's husband is at the bedside and notes that Lindsey Mullins has only been sleeping approximately 4 hours each night for the last several months as she works full time and then spends several hours every evening caring for her chronically ill mother. Patient is followed by Dr. Gwenlyn Found in the outpatient setting for hypertension and carotid artery disease. She underwent a carotid ultrasound in May 2016 with the endarterectomy site widely patent and noncritical stenosis at the ipsilateral ICA. Last stress test was performed with Myoview in 2013 and was interpreted as normal.  In ED, patient was found to be afebrile, saturating well on room air, with blood pressure in the 180/100 range, but vitals otherwise stable. EKG featured in normal sinus rhythm and was essentially normal. Chest x-ray was negative for acute cardiopulmonary disease. Initial troponin is undetectable. CMP is largely unremarkable and CBC notable for a leukocytosis to 11,000 and a stable chronic normocytic anemia with hemoglobin of 11.8.  Hospital Course:  1-chest pain: Atypical but with some typical features and positive risk factors. Heart score 3-4 -Patient was admitted to telemetry; which revealed no acute abnormalities -Serial EKG and troponins remains negative on without signs of acute ischemia -Patient pain is completely resolve at the moment of discharge -Chronic to continue aspirin, beta blocker, statins, and the use of ARB -Patient has been advised to follow a heart healthy diet -She was started on PPI twice a day and also ranitidine daily at bedtime for better control of her most likely cause of chest pain, which is GERD.  2-GERD: -PPI adjusted to twice a day and the patient was started on ranitidine daily  at bedtime for better control of her symptoms -Information provided about life style changes  3-atherosclerotic cardiovascular  disease -Continue aspirin, Zocor, ARB and beta blockers -Continue outpatient follow-up with Dr. Gwenlyn Found -had hx of Left CEA in 2006  4-hyperlipidemia: -Continue statins  5-hypertension: Elevated at the moment of admission secondary to being too for her antihypertensive medication, along with ongoing distress from hospital admission and pain. -once her antihypertensive medications were resumed blood pressure is well control and stable. -Patient advised to follow a heart healthy diet.   Procedures:  See below for x-ray reports  Consultations:  None  Discharge Exam: Filed Vitals:   01/11/16 0826 01/11/16 1300  BP: 105/63 94/52  Pulse: 60 68  Temp: 98.3 F (36.8 C) 98.7 F (37.1 C)  Resp:  20    General: Afebrile, denies any further chest pain and also denies shortness of breath. Cardiovascular: S1 and S2, no rubs, no gallops Respiratory: Good air movement bilaterally, no wheezing, no crackles, no rhonchi Abdomen: Soft, nontender, positive bowel sounds Extremities: No edema or cyanosis.  Discharge Instructions   Discharge Instructions    Diet - low sodium heart healthy    Complete by:  As directed      Discharge instructions    Complete by:  As directed   Take medications as prescribed Arrange follow up with PCP in 10 days Follow heart healthy diet Follow recommendation for lifestyle changes in patients with gastroesophageal reflux          Current Discharge Medication List    START taking these medications   Details  ranitidine (ZANTAC) 150 MG tablet Take 1 tablet (150 mg total) by mouth at bedtime. Qty: 30 tablet, Refills: 1      CONTINUE these medications which have CHANGED   Details  dexlansoprazole (DEXILANT) 60 MG capsule Take 1 capsule (60 mg total) by mouth 2 (two) times daily. Qty: 30 capsule, Refills: 6      CONTINUE these medications which have NOT CHANGED   Details  aspirin 81 MG tablet Take 81 mg by mouth every evening.     azithromycin  (ZITHROMAX) 250 MG tablet Take 2 tablets x 1 day, then 1 tab daily for 4 days Qty: 6 tablet, Refills: 0    benzonatate (TESSALON) 100 MG capsule Take 1 capsule (100 mg total) by mouth 2 (two) times daily as needed for cough. Qty: 20 capsule, Refills: 0    carvedilol (COREG) 3.125 MG tablet TAKE ONE TABLET BY MOUTH ONCE DAILY Qty: 30 tablet, Refills: 6    clotrimazole-betamethasone (LOTRISONE) cream Apply 1 application topically 2 (two) times daily. Qty: 45 g, Refills: 2    cyclobenzaprine (FLEXERIL) 5 MG tablet TAKE ONE TABLET BY MOUTH AT BEDTIME AS NEEDED FOR MUSCLE SPASM AND LEG CRAMPS Qty: 30 tablet, Refills: 1    loratadine (CLARITIN) 10 MG tablet Take 10 mg by mouth daily.    losartan (COZAAR) 50 MG tablet Take 1 tablet (50 mg total) by mouth daily. Qty: 30 tablet, Refills: 11    Multiple Vitamin (MULTIVITAMIN) capsule Take 1 capsule by mouth daily.    NITROSTAT 0.4 MG SL tablet DISSOLVE ONE TABLET UNDER THE TONGUE AS NEEDED FOR CHEST PAIN**MAX 3 TABLETS EACH 5 MINUTES APART** Qty: 25 tablet, Refills: 3    Omega-3 Krill Oil 500 MG CAPS Take 1 capsule by mouth daily.    simvastatin (ZOCOR) 40 MG tablet Take 1 tablet (40 mg total) by mouth every evening. Qty: 90 tablet, Refills: 3  Allergies  Allergen Reactions  . Codeine   . Lipitor [Atorvastatin] Other (See Comments)    Myalgias  . Lisinopril Cough  . Penicillins   . Pravastatin Other (See Comments)    Myalgias   Follow-up Information    Follow up with Vic Blackbird, MD. Schedule an appointment as soon as possible for a visit in 10 days.   Specialty:  Family Medicine   Contact information:   [address] [place] [address] [phone]       The results of significant diagnostics from this hospitalization (including imaging, microbiology, ancillary and laboratory) are listed below for reference.    Significant Diagnostic Studies: Dg Chest 2 View  01/10/2016  CLINICAL DATA:  Chest pain  EXAM: CHEST  2 VIEW COMPARISON:  07/06/2013 FINDINGS: Normal heart size. Lungs clear. No pneumothorax. No pleural effusion. IMPRESSION: No active cardiopulmonary disease. Electronically Signed   By: Marybelle Killings M.D.   On: 01/10/2016 19:29   Labs: Basic Metabolic Panel:  Recent Labs Lab 01/10/16 2011 01/11/16 0632  NA 140 140  K 4.1 4.2  CL 107 107  CO2 23 26  GLUCOSE 91 89  BUN 12 10  CREATININE 0.78 0.76  CALCIUM 8.7* 8.6*   CBC:  Recent Labs Lab 01/10/16 2011  WBC 10.9*  HGB 11.8*  HCT 36.2  MCV 95.8  PLT 250   Cardiac Enzymes:  Recent Labs Lab 01/10/16 2011 01/11/16 0115 01/11/16 0632 01/11/16 1200  TROPONINI <0.03 <0.03 <0.03 <0.03   BNP: BNP (last 3 results)  Recent Labs  01/11/16 0115  BNP 18.0    Signed:  Barton Dubois MD.  [facility] 01/11/2016, 2:58 PM

## 2016-01-11 NOTE — Care Management Obs Status (Signed)
MEDICARE OBSERVATION STATUS NOTIFICATION   Patient Details  Name: Lindsey Mullins MRN: [id number] Date of Birth: November 16, 1946   Medicare Observation Status Notification Given:  Yes    Sherald Barge, RN 01/11/2016, 10:00 AM

## 2016-01-11 NOTE — Care Management Note (Signed)
Case Management Note  Patient Details  Name: Lindsey Mullins MRN: [id number] Date of Birth: [birth year]  Subjective/Objective:                  Pt admitted for r/o CP. Pt is from home, lives with husband and is ind with ADL's. Pt ind with ADL's. Pt is from home, lives alone and is ind with ADL's. Pt plans to return home with self care.   Action/Plan: No CM needs.   Expected Discharge Date:  01/11/16               Expected Discharge Plan:  Home/Self Care  In-House Referral:  NA  Discharge planning Services  CM Consult  Post Acute Care Choice:  NA Choice offered to:  NA  DME Arranged:    DME Agency:     HH Arranged:    HH Agency:     Status of Service:  Completed, signed off  Medicare Important Message Given:    Date Medicare IM Given:    Medicare IM give by:    Date Additional Medicare IM Given:    Additional Medicare Important Message give by:     If discussed at [place] of Stay Meetings, dates discussed:    Additional Comments:  Sherald Barge, RN 01/11/2016, 10:00 AM

## 2016-01-12 ENCOUNTER — Encounter: Payer: Self-pay | Admitting: Physician Assistant

## 2016-01-12 ENCOUNTER — Ambulatory Visit (INDEPENDENT_AMBULATORY_CARE_PROVIDER_SITE_OTHER): Payer: Medicare Other | Admitting: Physician Assistant

## 2016-01-12 VITALS — BP 110/78 | HR 80 | Temp 98.2°F | Resp 18 | Wt 186.0 lb

## 2016-01-12 DIAGNOSIS — K219 Gastro-esophageal reflux disease without esophagitis: Secondary | ICD-10-CM

## 2016-01-12 DIAGNOSIS — R739 Hyperglycemia, unspecified: Secondary | ICD-10-CM

## 2016-01-12 DIAGNOSIS — R0789 Other chest pain: Secondary | ICD-10-CM

## 2016-01-12 DIAGNOSIS — I10 Essential (primary) hypertension: Secondary | ICD-10-CM

## 2016-01-12 DIAGNOSIS — R7302 Impaired glucose tolerance (oral): Secondary | ICD-10-CM | POA: Insufficient documentation

## 2016-01-12 DIAGNOSIS — Z09 Encounter for follow-up examination after completed treatment for conditions other than malignant neoplasm: Secondary | ICD-10-CM

## 2016-01-12 LAB — HEMOGLOBIN A1C
HEMOGLOBIN A1C: 6.3 % — AB (ref 4.8–5.6)
MEAN PLASMA GLUCOSE: 134 mg/dL

## 2016-01-12 NOTE — Progress Notes (Signed)
Patient ID: ANAJULIA LEYENDECKER MRN: [id number], DOB: May 21, 1946, 70 y.o. Date of Encounter: '@DATE'$ @  Chief Complaint:  Chief Complaint  Patient presents with  . hosp follow up    kept overnight for chest pain    HPI: 70 y.o. year old AA female  presents with above.   I Reviewed hospital discharge summary. Patient states that what she was feeling "felt like just like a burning hot poker"--- points to middle of her chest and says that it went to her left arm.  See discharge summary HPI for details.   THE FOLLOWING IS COPIED FROM DISCHARGE SUMMARY: Hospital Course:  1-chest pain: Atypical but with some typical features and positive risk factors. Heart score 3-4 -Patient was admitted to telemetry; which revealed no acute abnormalities -Serial EKG and troponins remains negative on without signs of acute ischemia -Patient pain is completely resolve at the moment of discharge -Chronic to continue aspirin, beta blocker, statins, and the use of ARB -Patient has been advised to follow a heart healthy diet -She was started on PPI twice a day and also ranitidine daily at bedtime for better control of her most likely cause of chest pain, which is GERD.  2-GERD: -PPI adjusted to twice a day and the patient was started on ranitidine daily at bedtime for better control of her symptoms -Information provided about life style changes  3-atherosclerotic cardiovascular disease -Continue aspirin, Zocor, ARB and beta blockers -Continue outpatient follow-up with Dr. Gwenlyn Found -had hx of Left CEA in 2006  4-hyperlipidemia: -Continue statins  5-hypertension: Elevated at the moment of admission secondary to being too for her antihypertensive medication, along with ongoing distress from hospital admission and pain. -once her antihypertensive medications were resumed blood pressure is well control and stable. -Patient advised to follow a heart healthy diet.    TODAY: Hospital discharge summary states  follow-up with PCP in 10 days. However, patient states that it was the hospital that scheduled this appointment for her today. Hospital discharge summary also states to repeat BMET to follow electrolytes and renal function----however last BMET was just performed yesterday. BMET was normal on both 2/21 and 2/22.  Hospital discharge summary does state to reassess blood pressure at follow-up appointment. Her pressure today is excellent at 110/78. I also reviewed the during the hospitalization her blood pressure was down at 105/63 and 94/52---However, they had documented that it was high in the ER. Today patient states that she has had no further sensations of that hot poker in her chest and left arm but also notes that she was just discharged from the hospital last night. She has no complaints or concerns.  Past Medical History  Diagnosis Date  . Hypertension   . Hyperlipidemia   . Restless leg syndrome   . GERD (gastroesophageal reflux disease)   . Cerebral atherosclerosis 09/29/2012    CAROTID DUPLEX - RIGHT  BULB/PROXIMAL ICA-moderate amount of fibrous plaque, 50-69% diameter reduction; LEFT CEA-normal, no significant diameter reduction  . Atypical chest pain 05/23/2012    STRESS TEST - small to moderate sized area of partial reversibility of the anteroapical wall, most likely breast artifact, post-stress EF 67%, EKG show NSR at 65, No Lexiscan EKG changes, non-diagnostic for ischemia; STRESS TEST, 01/25/2010 - normal study, post-stress EF 66%, no significant ischemia  . TIA (transient ischemic attack) 01/27/2010    2D ECHO - EF 65%, normal  . Shortness of breath 03/30/2005    2D ECHO - EF >55%, normal  Home Meds: Outpatient Prescriptions Prior to Visit  Medication Sig Dispense Refill  . aspirin 81 MG tablet Take 81 mg by mouth every evening.     . carvedilol (COREG) 3.125 MG tablet TAKE ONE TABLET BY MOUTH ONCE DAILY 30 tablet 6  . clotrimazole-betamethasone (LOTRISONE) cream Apply 1  application topically 2 (two) times daily. (Patient taking differently: Apply 1 application topically daily as needed. ) 45 g 2  . cyclobenzaprine (FLEXERIL) 5 MG tablet TAKE ONE TABLET BY MOUTH AT BEDTIME AS NEEDED FOR MUSCLE SPASM AND LEG CRAMPS 30 tablet 1  . dexlansoprazole (DEXILANT) 60 MG capsule Take 1 capsule (60 mg total) by mouth 2 (two) times daily. 30 capsule 6  . loratadine (CLARITIN) 10 MG tablet Take 10 mg by mouth daily.    [name] losartan (COZAAR) 50 MG tablet Take 1 tablet (50 mg total) by mouth daily. (Patient taking differently: Take 50 mg by mouth every evening. ) 30 tablet 11  . Multiple Vitamin (MULTIVITAMIN) capsule Take 1 capsule by mouth daily.    [name] NITROSTAT 0.4 MG SL tablet DISSOLVE ONE TABLET UNDER THE TONGUE AS NEEDED FOR CHEST PAIN**MAX 3 TABLETS EACH 5 MINUTES APART** 25 tablet 3  . Omega-3 Krill Oil 500 MG CAPS Take 1 capsule by mouth daily.    . ranitidine (ZANTAC) 150 MG tablet Take 1 tablet (150 mg total) by mouth at bedtime. 30 tablet 1  . simvastatin (ZOCOR) 40 MG tablet Take 1 tablet (40 mg total) by mouth every evening. 90 tablet 3  . benzonatate (TESSALON) 100 MG capsule Take 1 capsule (100 mg total) by mouth 2 (two) times daily as needed for cough. (Patient not taking: Reported on 01/12/2016) 20 capsule 0  . azithromycin (ZITHROMAX) 250 MG tablet Take 2 tablets x 1 day, then 1 tab daily for 4 days (Patient taking differently: Take 250-500 mg by mouth See admin instructions. Take 2 tablets x 1 day, then 1 tab daily for 4 days) 6 tablet 0   No facility-administered medications prior to visit.    Allergies:  Allergies  Allergen Reactions  . Codeine   . Lipitor [Atorvastatin] Other (See Comments)    Myalgias  . Lisinopril Cough  . Penicillins   . Pravastatin Other (See Comments)    Myalgias    Social History   Social History  . Marital Status: Divorced    Spouse Name: N/A  . Number of Children: N/A  . Years of Education: N/A   Occupational History    . Not on file.   Social History Main Topics  . Smoking status: Former Smoker    Quit date: 04/21/1983  . Smokeless tobacco: Never Used  . Alcohol Use: No  . Drug Use: No  . Sexual Activity: Not on file   Other Topics Concern  . Not on file   Social History Narrative    Family History  Problem Relation Age of Onset  . Heart disease Mother   . Hypertension Mother   . Hypertension Father   . Heart disease Father      Review of Systems:  See HPI for pertinent ROS. All other ROS negative.    Physical Exam: Blood pressure 110/78, pulse 80, temperature 98.2 F (36.8 C), temperature source Oral, resp. rate 18, weight 186 lb (84.369 kg)., Body mass index is 30.04 kg/(m^2). General: WNWD AAF. Appears in no acute distress. Neck: Supple. No thyromegaly. No lymphadenopathy. Lungs: Clear bilaterally to auscultation without wheezes, rales, or rhonchi. Breathing is unlabored. Heart:  RRR with S1 S2. No murmurs, rubs, or gallops. Musculoskeletal:  Strength and tone normal for age. Extremities/Skin: Warm and dry.  Neuro: Alert and oriented X 3. Moves all extremities spontaneously. Gait is normal. CNII-XII grossly in tact. Psych:  Responds to questions appropriately with a normal affect.     ASSESSMENT AND PLAN:  70 y.o. year old female with  1. Hospital discharge follow-up See HPI for details  2. Other chest pain --Felt to be secondary to GERD She is to take Dexilant twice a day and take Zantac at night-----I reviewed this with her and she voices her standing of how to take medications and agrees.  3. Gastroesophageal reflux disease without esophagitis She is to take Dexilant twice a day and take Zantac at night-----I reviewed this with her and she voices her standing of how to take medications and agrees.  4. Essential hypertension, benign Pt states that she has been taking her blood pressure medications on a daily basis but that she does take them at night. States that she went  to the ER right after work on that evening and had not taken her nighttime medication yet at the time that she went to the ER. Blood pressure is well controlled and at goal. Continue current medications.  5. Hyperglycemia Reviewed that in her labs at the hospital A1c was checked and was 6.3. However I reviewed that at all recent labs her glucose levels had been reading normal. I did give patient-- and reviewed with her-- low carbohydrate diet handout and for her to reduce carbohydrates in her diet. She is to have follow-up office visit here with Dr. Buelah Manis in 3 months and will recheck A1c at that visit.   Signed, [address] [place], [place], [facility] 01/12/2016 11:09 AM

## 2016-01-23 ENCOUNTER — Telehealth: Payer: Self-pay | Admitting: *Deleted

## 2016-01-23 MED ORDER — DEXLANSOPRAZOLE 60 MG PO CPDR: 60.0000 mg | DELAYED_RELEASE_CAPSULE | Freq: Every day | ORAL | Status: DC

## 2016-01-23 NOTE — Telephone Encounter (Signed)
Patient seen in ER for atypical chest pain and GERD.   ED MD began patient on Dexialnt '60mg'$  PO BID and Zantac '150mg'$  PO Q HS for GERD.   Received denial for PA of Dexialnt BID that Dr. Dyann Kief submitted.   Insurance prefers Dexilant PO QD dosing.   Patient has failed Zantac, Nexium, and Pantoprazole.   MD please advise.

## 2016-01-23 NOTE — Telephone Encounter (Signed)
Change to Dexilant '60mg'$  in morning Zantac at bedtime

## 2016-01-23 NOTE — Telephone Encounter (Signed)
Call placed to patient and patient made aware.   Prescription sent to pharmacy.  

## 2016-01-26 ENCOUNTER — Telehealth: Payer: Self-pay | Admitting: Family Medicine

## 2016-01-26 NOTE — Telephone Encounter (Signed)
Patient is calling to say that the dexilant is too expensive and would like to know if there is anything cheaper that can be prescribed  [phone]

## 2016-01-27 NOTE — Telephone Encounter (Signed)
Try Protonix '40mg'$  once a day Continue zantac at bedtime

## 2016-01-27 NOTE — Telephone Encounter (Signed)
lmtrc

## 2016-01-31 MED ORDER — PANTOPRAZOLE SODIUM 40 MG PO TBEC: 40.0000 mg | DELAYED_RELEASE_TABLET | Freq: Every day | ORAL | Status: DC

## 2016-01-31 NOTE — Telephone Encounter (Signed)
Tried calling pt again to advise of message, can not get pt, sent script in to pharmacy

## 2016-03-28 ENCOUNTER — Other Ambulatory Visit: Payer: Self-pay | Admitting: Family Medicine

## 2016-03-29 NOTE — Telephone Encounter (Signed)
Ok to refill 

## 2016-03-30 NOTE — Telephone Encounter (Signed)
Prescription sent to pharmacy.

## 2016-03-30 NOTE — Telephone Encounter (Signed)
okay

## 2016-04-25 ENCOUNTER — Ambulatory Visit: Payer: Medicare Other | Admitting: Family Medicine

## 2016-05-09 ENCOUNTER — Ambulatory Visit: Payer: Medicare Other | Admitting: Family Medicine

## 2016-05-11 ENCOUNTER — Ambulatory Visit (INDEPENDENT_AMBULATORY_CARE_PROVIDER_SITE_OTHER): Payer: Medicare Other | Admitting: Family Medicine

## 2016-05-11 ENCOUNTER — Encounter: Payer: Self-pay | Admitting: Family Medicine

## 2016-05-11 VITALS — BP 112/78 | HR 74 | Temp 97.8°F | Resp 16 | Ht 66.0 in | Wt 192.0 lb

## 2016-05-11 DIAGNOSIS — R7302 Impaired glucose tolerance (oral): Secondary | ICD-10-CM

## 2016-05-11 DIAGNOSIS — K219 Gastro-esophageal reflux disease without esophagitis: Secondary | ICD-10-CM

## 2016-05-11 DIAGNOSIS — M5136 Other intervertebral disc degeneration, lumbar region: Secondary | ICD-10-CM

## 2016-05-11 DIAGNOSIS — E669 Obesity, unspecified: Secondary | ICD-10-CM

## 2016-05-11 DIAGNOSIS — I10 Essential (primary) hypertension: Secondary | ICD-10-CM | POA: Diagnosis not present

## 2016-05-11 NOTE — Assessment & Plan Note (Signed)
No red flags referral to PT

## 2016-05-11 NOTE — Assessment & Plan Note (Signed)
PERISTANT reflux on PPI and zantac, she gets the spells where she gets flushing and GI upset during her episodes. We'll have her be seen by gastroenterology may need EGD

## 2016-05-11 NOTE — Progress Notes (Signed)
Patient ID: Lindsey Mullins, female   DOB: [birth year], 70 y.o.   MRN: [id number]   Subjective:    Patient ID: Lindsey Mullins, female    DOB: January 22, 1946, 70 y.o.   MRN: [id number]  Patient presents for 6 month F/U and Generalized pain Patient here to follow-up chronic medical problems. Her medications are reviewed. She is still followed by cardiology for her hypertension. She was actually hospitalized for a 24-hour observation since her last visit for chest pain was which was most likely due to reflux. Her reflux medication continued on dexilant and started on zantac at bedtime. He does continue to have a she is with her reflux despite the medication.  She's also continue have problems with her degenerative disc in her spine. 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 Physical therapy and this has been greater than 6 months ago she would like to try therapy see if that will help her pain  She is also family borderline diabetic with A1c of 6.3%. Her weight is down 2 pounds since her last visit in February she was 194 pounds at that time. She has known arthritis  Review Of Systems:  GEN- denies fatigue, fever, weight loss,weakness, recent illness HEENT- denies eye drainage, change in vision, nasal discharge, CVS- denies chest pain, palpitations RESP- denies SOB, cough, wheeze ABD- denies N/V, change in stools, abd pain GU- denies dysuria, hematuria, dribbling, incontinence MSK- + joint pain, muscle aches, injury Neuro- denies headache, dizziness, syncope, seizure activity       Objective:    BP 112/78 mmHg  Pulse 74  Temp(Src) 97.8 F (36.6 C) (Oral)  Resp 16  Ht '5\' 6"'$  (1.676 m)  Wt 192 lb (87.091 kg)  BMI 31.00 kg/m2 GEN- NAD, alert and oriented x3 HEENT- PERRL, EOMI, non injected sclera, pink conjunctiva, MMM, oropharynx clear CVS- RRR, no murmur RESP-CTAB ABD-NABS,soft,NT,ND MSK- Spine mild TTP lumbar spine, decreased ROM, neg SLR  EXT- No  edema Pulses- Radial, 2+        Assessment & Plan:      Problem List Items Addressed This Visit    Obesity    Discuss dietary changes. She is eating a lot more carbs and she is letting on and she is also using a lot of sweets. We'll recheck her A1c per above. She is incorporate more protein and fiber into her diet.      Glucose intolerance (impaired glucose tolerance)   Relevant Orders   Hemoglobin A1c   GERD (gastroesophageal reflux disease)    PERISTANT reflux on PPI and zantac, she gets the spells where she gets flushing and GI upset during her episodes. We'll have her be seen by gastroenterology may need EGD      Relevant Orders   Ambulatory referral to Gastroenterology   Essential hypertension, benign - Primary   Relevant Orders   CBC with Differential/Platelet   Comprehensive metabolic panel   DDD (degenerative disc disease), lumbar    No red flags referral to PT      Relevant Orders   Ambulatory referral to Physical Therapy      Note: This dictation was prepared with Dragon dictation along with smaller phrase technology. Any transcriptional errors that result from this process are unintentional.

## 2016-05-11 NOTE — Patient Instructions (Addendum)
Referral to GI for your stomach  Referral to physical therapy  F/U 4 months

## 2016-05-11 NOTE — Assessment & Plan Note (Signed)
Discuss dietary changes. She is eating a lot more carbs and she is letting on and she is also using a lot of sweets. We'll recheck her A1c per above. She is incorporate more protein and fiber into her diet.

## 2016-05-16 ENCOUNTER — Encounter (INDEPENDENT_AMBULATORY_CARE_PROVIDER_SITE_OTHER): Payer: Self-pay | Admitting: Internal Medicine

## 2016-05-17 ENCOUNTER — Other Ambulatory Visit: Payer: Self-pay | Admitting: Family Medicine

## 2016-05-17 NOTE — Telephone Encounter (Signed)
Ok to refill 

## 2016-05-18 NOTE — Telephone Encounter (Signed)
Okay , give 2

## 2016-05-31 ENCOUNTER — Other Ambulatory Visit: Payer: Self-pay | Admitting: Family Medicine

## 2016-05-31 ENCOUNTER — Ambulatory Visit (INDEPENDENT_AMBULATORY_CARE_PROVIDER_SITE_OTHER): Payer: Medicare Other | Admitting: Internal Medicine

## 2016-05-31 ENCOUNTER — Encounter (INDEPENDENT_AMBULATORY_CARE_PROVIDER_SITE_OTHER): Payer: Self-pay | Admitting: Internal Medicine

## 2016-06-01 NOTE — Telephone Encounter (Signed)
Refill appropriate and filled per protocol. 

## 2016-06-28 ENCOUNTER — Other Ambulatory Visit: Payer: Self-pay | Admitting: Cardiovascular Disease

## 2016-06-28 ENCOUNTER — Other Ambulatory Visit: Payer: Self-pay | Admitting: Family Medicine

## 2016-06-28 NOTE — Telephone Encounter (Signed)
Refill appropriate and filled per protocol. 

## 2016-06-29 ENCOUNTER — Other Ambulatory Visit: Payer: Self-pay | Admitting: *Deleted

## 2016-06-29 MED ORDER — LOSARTAN POTASSIUM 50 MG PO TABS: 50.0000 mg | ORAL_TABLET | Freq: Every day | ORAL | 0 refills | Status: DC

## 2016-06-30 ENCOUNTER — Other Ambulatory Visit: Payer: Self-pay | Admitting: Cardiovascular Disease

## 2016-07-05 ENCOUNTER — Other Ambulatory Visit: Payer: Self-pay | Admitting: Family Medicine

## 2016-07-06 NOTE — Telephone Encounter (Signed)
Refill appropriate and filled per protocol. 

## 2016-08-09 ENCOUNTER — Ambulatory Visit (INDEPENDENT_AMBULATORY_CARE_PROVIDER_SITE_OTHER): Payer: Medicare Other | Admitting: Internal Medicine

## 2016-08-09 ENCOUNTER — Encounter (INDEPENDENT_AMBULATORY_CARE_PROVIDER_SITE_OTHER): Payer: Self-pay | Admitting: Internal Medicine

## 2016-08-09 VITALS — BP 122/70 | HR 60 | Temp 98.0°F | Ht 66.0 in | Wt 189.1 lb

## 2016-08-09 DIAGNOSIS — K219 Gastro-esophageal reflux disease without esophagitis: Secondary | ICD-10-CM

## 2016-08-09 NOTE — Progress Notes (Signed)
Subjective:    Patient ID: Lindsey Mullins, female    DOB: 06-16-1946, 71 y.o.   MRN: [id number]  HPI Referred by Dr. Tawanna Sat for GERD. She tells me she had burning in her chest which started Monday. The burning lasted for about 15-20 minutes and then resolved. She took a Zantac and Tums and the pain resolved.  Denies similar incidence. Burning was not related to eating.  She tells me she has acid reflux. The Dexilant and Zantac controls her GERD.  For the most part she feels good. Her appetite is good. There has been no weight loss. She has a BM about 2-3 times a week. No melena or BRRB.  She avoid acidic foods (tomotatoes, orange  Juice). No dysphagia.    Review of Systems Past Medical History:  Diagnosis Date  . Atypical chest pain 05/23/2012   STRESS TEST - small to moderate sized area of partial reversibility of the anteroapical wall, most likely breast artifact, post-stress EF 67%, EKG show NSR at 65, No Lexiscan EKG changes, non-diagnostic for ischemia; STRESS TEST, 01/25/2010 - normal study, post-stress EF 66%, no significant ischemia  . Cerebral atherosclerosis 09/29/2012   CAROTID DUPLEX - RIGHT  BULB/PROXIMAL ICA-moderate amount of fibrous plaque, 50-69% diameter reduction; LEFT CEA-normal, no significant diameter reduction  . GERD (gastroesophageal reflux disease)   . Hyperlipidemia   . Hypertension   . Restless leg syndrome   . Shortness of breath 03/30/2005   2D ECHO - EF >55%, normal  . TIA (transient ischemic attack) 01/27/2010   2D ECHO - EF 65%, normal    Past Surgical History:  Procedure Laterality Date  . ABDOMINAL EXPLORATION SURGERY    . CAROTID ENDARTERECTOMY    . Peripheral Vascular Catheterization  07/16/2005   Right verterbral-50% smooth segmental badsilar artery stenosis on right side, Right carotid-50% ulcerated proxmial stenosis, Left carotid-60 to 70% left externam carotid artery stenosis, 90% proximal left ICA stenosis    Allergies  Allergen Reactions  .  Codeine   . Lipitor [Atorvastatin] Other (See Comments)    Myalgias  . Lisinopril Cough  . Penicillins   . Pravastatin Other (See Comments)    Myalgias    Current Outpatient Prescriptions on File Prior to Visit  Medication Sig Dispense Refill  . aspirin 81 MG tablet Take 81 mg by mouth every evening.     . carvedilol (COREG) 3.125 MG tablet TAKE ONE TABLET BY MOUTH ONCE DAILY 30 tablet 0  . clotrimazole-betamethasone (LOTRISONE) cream Apply 1 application topically 2 (two) times daily. (Patient taking differently: Apply 1 application topically daily as needed. ) 45 g 2  . cyclobenzaprine (FLEXERIL) 5 MG tablet TAKE ONE TABLET BY MOUTH AT BEDTIME AS NEEDED FOR MUSCLE SPASM AND FOR LEG CRAMPS 30 tablet 2  . dexlansoprazole (DEXILANT) 60 MG capsule Take 1 capsule (60 mg total) by mouth daily. 30 capsule 6  . loratadine (CLARITIN) 10 MG tablet Take 10 mg by mouth daily.    [name] losartan (COZAAR) 50 MG tablet Take 1 tablet (50 mg total) by mouth daily. NEED OV. 15 tablet 0  . Multiple Vitamin (MULTIVITAMIN) capsule Take 1 capsule by mouth daily.    [name] NITROSTAT 0.4 MG SL tablet DISSOLVE ONE TABLET UNDER THE TONGUE AS NEEDED FOR CHEST PAIN**MAX 3 TABLETS EACH 5 MINUTES APART** 25 tablet 3  . Omega-3 Krill Oil 500 MG CAPS Take 1 capsule by mouth daily.    . ranitidine (ZANTAC) 150 MG tablet Take 1 tablet (150 mg  total) by mouth at bedtime. 30 tablet 1  . simvastatin (ZOCOR) 40 MG tablet TAKE ONE TABLET BY MOUTH IN THE EVENING 90 tablet 3   No current facility-administered medications on file prior to visit.        Objective:   Physical Exam Blood pressure 122/70, pulse 60, temperature 98 F (36.7 C), height '5\' 6"'$  (1.676 m), weight 189 lb 1.6 oz (85.8 kg).  Alert and oriented. Skin warm and dry. Oral mucosa is moist.   . Sclera anicteric, conjunctivae is pink. Thyroid not enlarged. No cervical lymphadenopathy. Lungs clear. Heart regular rate and rhythm.  Abdomen is soft. Bowel sounds are  positive. No hepatomegaly. No abdominal masses felt. No tenderness.  No edema to lower extremities.        Assessment & Plan:  GERD controlled at this time with Dexilant and Zantac. Continue present medication. OV in 6 months.  She will bring a list of her home meds so I can be sure they ar correct.

## 2016-08-09 NOTE — Patient Instructions (Signed)
Continue the Dexilant and Zantac. OV in 6 months.

## 2016-08-31 ENCOUNTER — Encounter: Payer: Self-pay | Admitting: Family Medicine

## 2016-09-14 ENCOUNTER — Ambulatory Visit (INDEPENDENT_AMBULATORY_CARE_PROVIDER_SITE_OTHER): Payer: Medicare Other | Admitting: Family Medicine

## 2016-09-14 ENCOUNTER — Encounter: Payer: Self-pay | Admitting: Family Medicine

## 2016-09-14 VITALS — BP 138/78 | HR 72 | Temp 97.8°F | Resp 12 | Ht 66.0 in | Wt 190.0 lb

## 2016-09-14 DIAGNOSIS — R7302 Impaired glucose tolerance (oral): Secondary | ICD-10-CM | POA: Diagnosis not present

## 2016-09-14 DIAGNOSIS — G2581 Restless legs syndrome: Secondary | ICD-10-CM | POA: Diagnosis not present

## 2016-09-14 DIAGNOSIS — Z23 Encounter for immunization: Secondary | ICD-10-CM

## 2016-09-14 DIAGNOSIS — M5136 Other intervertebral disc degeneration, lumbar region: Secondary | ICD-10-CM | POA: Diagnosis not present

## 2016-09-14 DIAGNOSIS — I10 Essential (primary) hypertension: Secondary | ICD-10-CM

## 2016-09-14 MED ORDER — LOSARTAN POTASSIUM 50 MG PO TABS: 50.0000 mg | ORAL_TABLET | Freq: Every day | ORAL | 1 refills | Status: DC

## 2016-09-14 MED ORDER — SIMVASTATIN 40 MG PO TABS: 40.0000 mg | ORAL_TABLET | Freq: Every evening | ORAL | 3 refills | Status: DC

## 2016-09-14 MED ORDER — CARVEDILOL 3.125 MG PO TABS: 3.1250 mg | ORAL_TABLET | Freq: Every day | ORAL | 2 refills | Status: DC

## 2016-09-14 MED ORDER — DICLOFENAC SODIUM 1 % TD GEL: TRANSDERMAL | 1 refills | Status: DC

## 2016-09-14 MED ORDER — NITROGLYCERIN 0.4 MG SL SUBL: SUBLINGUAL_TABLET | SUBLINGUAL | 3 refills | Status: AC

## 2016-09-14 MED ORDER — CYCLOBENZAPRINE HCL 5 MG PO TABS: ORAL_TABLET | ORAL | 0 refills | Status: DC

## 2016-09-14 MED ORDER — RANITIDINE HCL 150 MG PO TABS: 150.0000 mg | ORAL_TABLET | Freq: Every day | ORAL | 1 refills | Status: DC

## 2016-09-14 MED ORDER — DEXLANSOPRAZOLE 60 MG PO CPDR: 60.0000 mg | DELAYED_RELEASE_CAPSULE | Freq: Every day | ORAL | 1 refills | Status: DC

## 2016-09-14 NOTE — Assessment & Plan Note (Signed)
Uses flexeril as needed

## 2016-09-14 NOTE — Patient Instructions (Signed)
F/U  3 months 

## 2016-09-14 NOTE — Assessment & Plan Note (Signed)
Known DDD but with other joint pain since her falls, given topical Voltaren as this and tylenol is the only thing she can tolerate F/u with the workers Comp doctors from school

## 2016-09-14 NOTE — Progress Notes (Signed)
   Subjective:    Patient ID: Lindsey Mullins, female    DOB: 1946-04-27, 70 y.o.   MRN: [id number]  Patient presents for Medication Management (is not fasting) and Hip Pain (reports continued pain to hip, leg and spine) Here to follow-up medications. She has known degenerative disc disease as well as osteoarthritis. 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 The last visit she wanted to try physical therapy to see if this would help she typically just takes Tylenol for pain. I sent her purposes of therapy however because this is Worker's Comp. they cannot do it. She states that her occupational health/workers, Dr. has not done anything else further but she is going to make a follow-up appointment with them and does not want me to worry about intervening at this time.   She is taking her blood pressure medicine as prescribed but she is out of her Coreg. She is scheduled appointment with her cardiologist.    Review Of Systems:  GEN- denies fatigue, fever, weight loss,weakness, recent illness HEENT- denies eye drainage, change in vision, nasal discharge, CVS- denies chest pain, palpitations RESP- denies SOB, cough, wheeze ABD- denies N/V, change in stools, abd pain GU- denies dysuria, hematuria, dribbling, incontinence MSK-+ joint pain, muscle aches, injury Neuro- denies headache, dizziness, syncope, seizure activity       Objective:    BP 138/78 (BP Location: Left Arm, Patient Position: Sitting, Cuff Size: Large)   Pulse 72   Temp 97.8 F (36.6 C) (Oral)   Resp 12   Ht '5\' 6"'$  (1.676 m)   Wt 190 lb (86.2 kg)   BMI 30.67 kg/m  GEN- NAD, alert and oriented x3 HEENT- PERRL, EOMI, non injected sclera, pink conjunctiva, MMM, oropharynx clear CVS- RRR, no murmur RESP-CTAB EXT- No edema Pulses- Radial 2+        Assessment & Plan:      Problem List Items Addressed This Visit    RLS (restless legs syndrome)    Uses  flexeril as needed       Glucose intolerance (impaired glucose tolerance) - Primary    Recheck A1C      Relevant Orders   Hemoglobin A1c   Essential hypertension, benign    Controlled, with current medications, no changes       Relevant Medications   carvedilol (COREG) 3.125 MG tablet   losartan (COZAAR) 50 MG tablet   simvastatin (ZOCOR) 40 MG tablet   nitroGLYCERIN (NITROSTAT) 0.4 MG SL tablet   Other Relevant Orders   CBC with Differential/Platelet   Comprehensive metabolic panel   DDD (degenerative disc disease), lumbar    Known DDD but with other joint pain since her falls, given topical Voltaren as this and tylenol is the only thing she can tolerate F/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     Note: This dictation was prepared with Dragon dictation along with smaller phrase technology. Any transcriptional errors that result from this process are unintentional.

## 2016-09-14 NOTE — Assessment & Plan Note (Signed)
Recheck A1C 

## 2016-09-14 NOTE — Assessment & Plan Note (Signed)
Controlled, with current medications, no changes

## 2016-09-14 NOTE — Addendum Note (Signed)
Addended by: Sheral Flow on: 09/14/2016 05:45 PM   Modules accepted: Orders

## 2016-09-15 LAB — COMPREHENSIVE METABOLIC PANEL
ALK PHOS: 83 U/L (ref 33–130)
ALT: 14 U/L (ref 6–29)
AST: 30 U/L (ref 10–35)
Albumin: 4.3 g/dL (ref 3.6–5.1)
BUN: 11 mg/dL (ref 7–25)
CHLORIDE: 108 mmol/L (ref 98–110)
CO2: 26 mmol/L (ref 20–31)
Calcium: 9.6 mg/dL (ref 8.6–10.4)
Creat: 0.81 mg/dL (ref 0.50–0.99)
GLUCOSE: 93 mg/dL (ref 70–99)
POTASSIUM: 3.6 mmol/L (ref 3.5–5.3)
Sodium: 141 mmol/L (ref 135–146)
Total Bilirubin: 0.6 mg/dL (ref 0.2–1.2)
Total Protein: 7.3 g/dL (ref 6.1–8.1)

## 2016-09-15 LAB — CBC WITH DIFFERENTIAL/PLATELET
BASOS PCT: 0 %
Basophils Absolute: 0 cells/uL (ref 0–200)
EOS ABS: 288 {cells}/uL (ref 15–500)
EOS PCT: 3 %
HCT: 35.3 % (ref 35.0–45.0)
Hemoglobin: 11.7 g/dL — ABNORMAL LOW (ref 12.0–15.0)
LYMPHS PCT: 33 %
Lymphs Abs: 3168 cells/uL (ref 850–3900)
MCH: 30.4 pg (ref 27.0–33.0)
MCHC: 33.1 g/dL (ref 32.0–36.0)
MCV: 91.7 fL (ref 80.0–100.0)
MONOS PCT: 8 %
MPV: 9.7 fL (ref 7.5–12.5)
Monocytes Absolute: 768 cells/uL (ref 200–950)
Neutro Abs: 5376 cells/uL (ref 1500–7800)
Neutrophils Relative %: 56 %
PLATELETS: 368 10*3/uL (ref 140–400)
RBC: 3.85 MIL/uL (ref 3.80–5.10)
RDW: 14.3 % (ref 11.0–15.0)
WBC: 9.6 10*3/uL (ref 3.8–10.8)

## 2016-09-15 LAB — HEMOGLOBIN A1C
Hgb A1c MFr Bld: 5.7 % — ABNORMAL HIGH (ref <5.7)
Mean Plasma Glucose: 117 mg/dL

## 2016-09-27 ENCOUNTER — Telehealth: Payer: Self-pay | Admitting: *Deleted

## 2016-09-27 NOTE — Telephone Encounter (Signed)
Received request from pharmacy for PA on Voltaren Gel.   PA submitted.   Dx: OA

## 2016-10-01 NOTE — Telephone Encounter (Signed)
Received PA determination.   PA approved 09/28/2016- 11/18/2017.  Ref #: [id number].  Pharmacy made aware.

## 2016-10-24 ENCOUNTER — Telehealth: Payer: Self-pay | Admitting: *Deleted

## 2016-10-24 NOTE — Telephone Encounter (Signed)
noted 

## 2016-10-24 NOTE — Telephone Encounter (Signed)
Received [place] [employer] Medical Review Forms.   Medical review is in regards to HTN/ HLD/ facet arthritis/ chronic back pain/ RLS/TIA.  Forms to be completed Q 2 years.  Verbalized that fee may be charged and is per provider prerogative.   Forms routed to provider.

## 2016-10-25 NOTE — Telephone Encounter (Signed)
Received completed forms from provider.   No fee per provider.   Faxed to [place] [employer] [phone]~ fax.   Copy left up front for patient to pick up.

## 2017-01-09 ENCOUNTER — Other Ambulatory Visit: Payer: Self-pay | Admitting: Family Medicine

## 2017-01-31 ENCOUNTER — Telehealth: Payer: Self-pay | Admitting: Cardiovascular Disease

## 2017-01-31 NOTE — Telephone Encounter (Signed)
Closed encounter °

## 2017-02-06 ENCOUNTER — Ambulatory Visit (INDEPENDENT_AMBULATORY_CARE_PROVIDER_SITE_OTHER): Payer: Medicare Other | Admitting: Internal Medicine

## 2017-02-08 ENCOUNTER — Ambulatory Visit (INDEPENDENT_AMBULATORY_CARE_PROVIDER_SITE_OTHER): Payer: Medicare Other | Admitting: Cardiovascular Disease

## 2017-02-08 ENCOUNTER — Ambulatory Visit: Payer: Medicare Other | Admitting: Cardiovascular Disease

## 2017-02-08 ENCOUNTER — Encounter: Payer: Self-pay | Admitting: Cardiovascular Disease

## 2017-02-08 VITALS — BP 138/82 | HR 76 | Ht 66.0 in | Wt 190.2 lb

## 2017-02-08 DIAGNOSIS — I10 Essential (primary) hypertension: Secondary | ICD-10-CM

## 2017-02-08 DIAGNOSIS — E785 Hyperlipidemia, unspecified: Secondary | ICD-10-CM

## 2017-02-08 DIAGNOSIS — I779 Disorder of arteries and arterioles, unspecified: Secondary | ICD-10-CM | POA: Diagnosis not present

## 2017-02-08 DIAGNOSIS — I739 Peripheral vascular disease, unspecified: Principal | ICD-10-CM

## 2017-02-08 NOTE — Patient Instructions (Addendum)
Medication Instructions:  Your physician recommends that you continue on your current medications as directed. Please refer to the Current Medication list given to you today.  Labwork: Your physician recommends that you return for lab work in: FASTING-LIPID, AND LIVER   Testing/Procedures: Your physician has requested that you have a carotid duplex. This test is an ultrasound of the carotid arteries in your neck. It looks at blood flow through these arteries that supply the brain with blood. Allow one hour for this exam. There are no restrictions or special instructions.   Follow-Up: Your physician wants you to follow-up in: [place]. You will receive a reminder letter in the mail two months in advance. If you don't receive a letter, please call our office to schedule the follow-up appointment. Any Other Special Instructions Will Be Listed Below (If Applicable).     If you need a refill on your cardiac medications before your next appointment, please call your pharmacy.

## 2017-02-08 NOTE — Assessment & Plan Note (Signed)
History of hyperlipidemia on statin therapy. We will recheck a lipid and liver profile 

## 2017-02-08 NOTE — Assessment & Plan Note (Signed)
History of carotid artery disease status post left carotid endarterectomy performed by Dr. Erskin Burnet 08/09/05. Her last Doppler study performed 05/11/15 revealed a widely patent endarterectomy site with mild to moderate right ICA stenosis. She does have a right carotid bruit. We will recheck carotid Doppler studies.

## 2017-02-08 NOTE — Progress Notes (Signed)
02/08/2017 Lindsey Mullins   1946-04-24  [id number]  Primary Physician Vic Blackbird, MD Primary Cardiologist: Lorretta Harp MD Lupe Carney, [place]  HPI:   The patient is a 71 year old, mildly overweight, divorced, African American female, mother of two, grandmother to one grandchild, who I last saw in the office 05/17/15. [name] She has a history of hypertension, hyperlipidemia, and vascular disease. I performed an angiogram on her on July 14, 2005, revealing a high grade left internal carotid artery stenosis. She underwent elective left carotid endarterectomy by Dr. Drucie Opitz on August 09, 2005, with followup Dopplers done in our office as recently as last October showing a widely patent endarterectomy site with moderate right ICA stenosis. She remains neurologically asymptomatic. She was seen in the office by [name] with complaints of chest burning. She also has a history of GERD. She has had multiple negative Myoviews in the past and a Lexiscan Myoview performed on May 23, 2012, was negative as well. She was hospitalized overnight 01/11/16 for atypical chest pain thought to be related to GERD..   Current Outpatient Prescriptions  Medication Sig Dispense Refill  . aspirin 81 MG tablet Take 81 mg by mouth every evening.     . carvedilol (COREG) 3.125 MG tablet Take 1 tablet (3.125 mg total) by mouth daily. 90 tablet 2  . clotrimazole-betamethasone (LOTRISONE) cream Apply 1 application topically 2 (two) times daily. 45 g 2  . cyclobenzaprine (FLEXERIL) 5 MG tablet TAKE ONE TABLET BY MOUTH AT BEDTIME AS NEEDED FOR MUSCLE SPASM AND FOR LEG CRAMPS 90 tablet 0  . dexlansoprazole (DEXILANT) 60 MG capsule Take 1 capsule (60 mg total) by mouth daily. 90 capsule 1  . diclofenac sodium (VOLTAREN) 1 % GEL Apply to affected are Three times a day as needed 1 Tube 1  . loratadine (CLARITIN) 10 MG tablet Take 10 mg by mouth daily.    [name] losartan (COZAAR) 50 MG tablet Take 1 tablet (50 mg  total) by mouth daily. 90 tablet 1  . Multiple Vitamin (MULTIVITAMIN) capsule Take 1 capsule by mouth daily.    . nitroGLYCERIN (NITROSTAT) 0.4 MG SL tablet DISSOLVE ONE TABLET UNDER THE TONGUE AS NEEDED FOR CHEST PAIN**MAX 3 TABLETS EACH 5 MINUTES APART** 25 tablet 3  . Omega-3 Krill Oil 500 MG CAPS Take 1 capsule by mouth daily.    . ranitidine (ZANTAC) 150 MG tablet TAKE ONE TABLET BY MOUTH ONCE DAILY AT BEDTIME 30 tablet 1  . simvastatin (ZOCOR) 40 MG tablet Take 1 tablet (40 mg total) by mouth every evening. 90 tablet 3   No current facility-administered medications for this visit.     Allergies  Allergen Reactions  . Codeine   . Lipitor [Atorvastatin] Other (See Comments)    Myalgias  . Lisinopril Cough  . Penicillins   . Pravastatin Other (See Comments)    Myalgias    Social History   Social History  . Marital status: Divorced    Spouse name: N/A  . Number of children: N/A  . Years of education: N/A   Occupational History  . Not on file.   Social History Main Topics  . Smoking status: Former Smoker    Quit date: 04/21/1983  . Smokeless tobacco: Never Used  . Alcohol use No  . Drug use: No  . Sexual activity: Not on file   Other Topics Concern  . Not on file   Social History Narrative  . No narrative on file  Review of Systems: General: negative for chills, fever, night sweats or weight changes.  Cardiovascular: negative for chest pain, dyspnea on exertion, edema, orthopnea, palpitations, paroxysmal nocturnal dyspnea or shortness of breath Dermatological: negative for rash Respiratory: negative for cough or wheezing Urologic: negative for hematuria Abdominal: negative for nausea, vomiting, diarrhea, bright red blood per rectum, melena, or hematemesis Neurologic: negative for visual changes, syncope, or dizziness All other systems reviewed and are otherwise negative except as noted above.    Blood pressure 138/82, pulse 76, height '5\' 6"'$  (1.676 m),  weight 190 lb 3.2 oz (86.3 kg).  General appearance: alert and no distress Neck: no adenopathy, no JVD, supple, symmetrical, trachea midline, thyroid not enlarged, symmetric, no tenderness/mass/nodules and Soft right carotid bruit Lungs: no adenopathy, no JVD, supple, symmetrical, trachea midline, thyroid not enlarged, symmetric, no tenderness/mass/nodules and Soft right carotid bruit Heart: regular rate and rhythm, S1, S2 normal, no murmur, click, rub or gallop Extremities: extremities normal, atraumatic, no cyanosis or edema  EKG sinus rhythm at 76 with nonspecific ST and T-wave changes. I personally reviewed this EKG  ASSESSMENT AND PLAN:   Essential hypertension, benign History of hypertension blood pressure measured today at 138/82. She is on carvedilol and losartan. 2 current meds at current dosing  Hyperlipidemia History of hyperlipidemia on statin therapy. We will recheck a lipid and liver profile  Carotid artery disease (HCC) History of carotid artery disease status post left carotid endarterectomy performed by Dr. Erskin Burnet 08/09/05. Her last Doppler study performed 05/11/15 revealed a widely patent endarterectomy site with mild to moderate right ICA stenosis. She does have a right carotid bruit. We will recheck carotid Doppler studies.      Lorretta Harp MD FACP,FACC,FAHA, [facility] 02/08/2017 2:50 PM

## 2017-02-08 NOTE — Assessment & Plan Note (Signed)
History of hypertension blood pressure measured today at 138/82. She is on carvedilol and losartan. 2 current meds at current dosing

## 2017-02-12 ENCOUNTER — Other Ambulatory Visit: Payer: Self-pay | Admitting: Cardiovascular Disease

## 2017-02-12 LAB — HEPATIC FUNCTION PANEL
ALBUMIN: 3.9 g/dL (ref 3.6–5.1)
ALK PHOS: 90 U/L (ref 33–130)
ALT: 11 U/L (ref 6–29)
AST: 23 U/L (ref 10–35)
BILIRUBIN TOTAL: 0.7 mg/dL (ref 0.2–1.2)
Bilirubin, Direct: 0.1 mg/dL (ref <=0.2)
Indirect Bilirubin: 0.6 mg/dL (ref 0.2–1.2)
Total Protein: 7 g/dL (ref 6.1–8.1)

## 2017-02-12 LAB — LIPID PANEL
Cholesterol: 255 mg/dL — ABNORMAL HIGH (ref <200)
HDL: 51 mg/dL (ref >50)
LDL Cholesterol: 169 mg/dL — ABNORMAL HIGH (ref <100)
Total CHOL/HDL Ratio: 5 Ratio — ABNORMAL HIGH (ref <5.0)
Triglycerides: 175 mg/dL — ABNORMAL HIGH (ref <150)
VLDL: 35 mg/dL — ABNORMAL HIGH (ref <30)

## 2017-02-13 ENCOUNTER — Ambulatory Visit (INDEPENDENT_AMBULATORY_CARE_PROVIDER_SITE_OTHER): Payer: Medicare Other | Admitting: Internal Medicine

## 2017-02-13 ENCOUNTER — Encounter (INDEPENDENT_AMBULATORY_CARE_PROVIDER_SITE_OTHER): Payer: Self-pay | Admitting: Internal Medicine

## 2017-02-13 VITALS — BP 140/82 | HR 60 | Temp 97.7°F | Ht 66.0 in | Wt 189.9 lb

## 2017-02-13 DIAGNOSIS — K219 Gastro-esophageal reflux disease without esophagitis: Secondary | ICD-10-CM | POA: Diagnosis not present

## 2017-02-13 MED ORDER — OMEPRAZOLE 40 MG PO CPDR: 40.0000 mg | DELAYED_RELEASE_CAPSULE | Freq: Every day | ORAL | 3 refills | Status: DC

## 2017-02-13 NOTE — Progress Notes (Signed)
Subjective:    Patient ID: Lindsey Mullins, female    DOB: Aug 05, 1946, 71 y.o.   MRN: [id number]  HPI Here today for f/u. She was last seen in September. Hx of GERD. Takes Zantac BID.  She was not able to afford the Dexilant. She tells me the Zantac helps with her GERD. She occasionally has breakthru with the Zantac. Her appetite is good. She has maintained her weight. She has a BM about every other days.  She avoid acidic foods. No dysphagia. She does not exercise. 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 [name]  PCP Dr. Buelah Manis.     Review of Systems Past Medical History:  Diagnosis Date  . Atypical chest pain 05/23/2012   STRESS TEST - small to moderate sized area of partial reversibility of the anteroapical wall, most likely breast artifact, post-stress EF 67%, EKG show NSR at 65, No Lexiscan EKG changes, non-diagnostic for ischemia; STRESS TEST, 01/25/2010 - normal study, post-stress EF 66%, no significant ischemia  . Cerebral atherosclerosis 09/29/2012   CAROTID DUPLEX - RIGHT  BULB/PROXIMAL ICA-moderate amount of fibrous plaque, 50-69% diameter reduction; LEFT CEA-normal, no significant diameter reduction  . GERD (gastroesophageal reflux disease)   . Hyperlipidemia   . Hypertension   . Restless leg syndrome   . Shortness of breath 03/30/2005   2D ECHO - EF >55%, normal  . TIA (transient ischemic attack) 01/27/2010   2D ECHO - EF 65%, normal    Past Surgical History:  Procedure Laterality Date  . ABDOMINAL EXPLORATION SURGERY    . CAROTID ENDARTERECTOMY    . Peripheral Vascular Catheterization  07/16/2005   Right verterbral-50% smooth segmental badsilar artery stenosis on right side, Right carotid-50% ulcerated proxmial stenosis, Left carotid-60 to 70% left externam carotid artery stenosis, 90% proximal left ICA stenosis    Allergies  Allergen Reactions  . Codeine   . Lipitor [Atorvastatin] Other (See Comments)    Myalgias  . Lisinopril Cough  . Penicillins     . Pravastatin Other (See Comments)    Myalgias    Current Outpatient Prescriptions on File Prior to Visit  Medication Sig Dispense Refill  . aspirin 81 MG tablet Take 81 mg by mouth every evening.     . carvedilol (COREG) 3.125 MG tablet Take 1 tablet (3.125 mg total) by mouth daily. 90 tablet 2  . loratadine (CLARITIN) 10 MG tablet Take 10 mg by mouth daily.    [name] losartan (COZAAR) 50 MG tablet Take 1 tablet (50 mg total) by mouth daily. 90 tablet 1  . Multiple Vitamin (MULTIVITAMIN) capsule Take 1 capsule by mouth daily.    . Omega-3 Krill Oil 500 MG CAPS Take 1 capsule by mouth daily.    . ranitidine (ZANTAC) 150 MG tablet TAKE ONE TABLET BY MOUTH ONCE DAILY AT BEDTIME 30 tablet 1  . simvastatin (ZOCOR) 40 MG tablet Take 1 tablet (40 mg total) by mouth every evening. 90 tablet 3  . nitroGLYCERIN (NITROSTAT) 0.4 MG SL tablet DISSOLVE ONE TABLET UNDER THE TONGUE AS NEEDED FOR CHEST PAIN**MAX 3 TABLETS EACH 5 MINUTES APART** 25 tablet 3   No current facility-administered medications on file prior to visit.        Objective:   Physical Exam Blood pressure 140/82, pulse 60, temperature 97.7 F (36.5 C), height '5\' 6"'$  (1.676 m), weight 189 lb 14.4 oz (86.1 kg).Alert and oriented. Skin warm and dry. Oral mucosa is moist.   . Sclera anicteric, conjunctivae is pink.  Thyroid not enlarged. No cervical lymphadenopathy. Lungs clear. Heart regular rate and rhythm.  Abdomen is soft. Bowel sounds are positive. No hepatomegaly. No abdominal masses felt. No tenderness.  No edema to lower extremities.         Assessment & Plan:  GERD . Omeprazole in am and Zantac at night.  OV in 1 year.

## 2017-02-13 NOTE — Patient Instructions (Signed)
Omeprazole 30 minutes before breakfast and take Zantac at night. OV in 1 year.

## 2017-02-19 ENCOUNTER — Encounter: Payer: Self-pay | Admitting: Family Medicine

## 2017-02-19 ENCOUNTER — Ambulatory Visit (INDEPENDENT_AMBULATORY_CARE_PROVIDER_SITE_OTHER): Payer: Medicare Other | Admitting: Family Medicine

## 2017-02-19 VITALS — BP 130/78 | HR 68 | Temp 97.5°F | Resp 14 | Wt 187.0 lb

## 2017-02-19 DIAGNOSIS — K5732 Diverticulitis of large intestine without perforation or abscess without bleeding: Secondary | ICD-10-CM | POA: Diagnosis not present

## 2017-02-19 LAB — CBC WITH DIFFERENTIAL/PLATELET
BASOS PCT: 0 %
Basophils Absolute: 0 cells/uL (ref 0–200)
EOS PCT: 4 %
Eosinophils Absolute: 324 cells/uL (ref 15–500)
HEMATOCRIT: 37.8 % (ref 35.0–45.0)
HEMOGLOBIN: 12.4 g/dL (ref 12.0–15.0)
LYMPHS ABS: 3240 {cells}/uL (ref 850–3900)
LYMPHS PCT: 40 %
MCH: 30.5 pg (ref 27.0–33.0)
MCHC: 32.8 g/dL (ref 32.0–36.0)
MCV: 92.9 fL (ref 80.0–100.0)
MONO ABS: 567 {cells}/uL (ref 200–950)
MPV: 9.7 fL (ref 7.5–12.5)
Monocytes Relative: 7 %
NEUTROS PCT: 49 %
Neutro Abs: 3969 cells/uL (ref 1500–7800)
Platelets: 351 10*3/uL (ref 140–400)
RBC: 4.07 MIL/uL (ref 3.80–5.10)
RDW: 14.6 % (ref 11.0–15.0)
WBC: 8.1 10*3/uL (ref 3.8–10.8)

## 2017-02-19 MED ORDER — METRONIDAZOLE 500 MG PO TABS: 500.0000 mg | ORAL_TABLET | Freq: Three times a day (TID) | ORAL | 0 refills | Status: DC

## 2017-02-19 MED ORDER — CIPROFLOXACIN HCL 500 MG PO TABS: 500.0000 mg | ORAL_TABLET | Freq: Two times a day (BID) | ORAL | 0 refills | Status: DC

## 2017-02-19 MED ORDER — RANITIDINE HCL 150 MG PO TABS: 150.0000 mg | ORAL_TABLET | Freq: Every day | ORAL | 1 refills | Status: DC

## 2017-02-19 NOTE — Progress Notes (Signed)
Subjective:    Patient ID: Lindsey Mullins, female    DOB: [birth year], 71 y.o.   MRN: [id number]  HPI Symptoms began Sunday evening. Patient developed left lower quadrant abdominal pain. Pain was 10 out of 10 in severity. Patient took some Pepto-Bismol the pain improved slightly but returned just a severe Monday. She is here today for evaluation. The pain is some better today. She grades it on a scale as a 5/10. She is very tender to palpation left lower quadrant. She had 3 bowel movements yesterday she denies any blood in her stool or melena. She denies any nausea or vomiting. She does report subjective fevers. She denies any dysuria or hematuria. The pain is constant. There are no exacerbating or alleviating factors Past Medical History:  Diagnosis Date  . Atypical chest pain 05/23/2012   STRESS TEST - small to moderate sized area of partial reversibility of the anteroapical wall, most likely breast artifact, post-stress EF 67%, EKG show NSR at 65, No Lexiscan EKG changes, non-diagnostic for ischemia; STRESS TEST, 01/25/2010 - normal study, post-stress EF 66%, no significant ischemia  . Cerebral atherosclerosis 09/29/2012   CAROTID DUPLEX - RIGHT  BULB/PROXIMAL ICA-moderate amount of fibrous plaque, 50-69% diameter reduction; LEFT CEA-normal, no significant diameter reduction  . GERD (gastroesophageal reflux disease)   . Hyperlipidemia   . Hypertension   . Restless leg syndrome   . Shortness of breath 03/30/2005   2D ECHO - EF >55%, normal  . TIA (transient ischemic attack) 01/27/2010   2D ECHO - EF 65%, normal   Past Surgical History:  Procedure Laterality Date  . ABDOMINAL EXPLORATION SURGERY    . CAROTID ENDARTERECTOMY    . Peripheral Vascular Catheterization  07/16/2005   Right verterbral-50% smooth segmental badsilar artery stenosis on right side, Right carotid-50% ulcerated proxmial stenosis, Left carotid-60 to 70% left externam carotid artery stenosis, 90% proximal left ICA stenosis    Current Outpatient Prescriptions on File Prior to Visit  Medication Sig Dispense Refill  . aspirin 81 MG tablet Take 81 mg by mouth every evening.     . carvedilol (COREG) 3.125 MG tablet Take 1 tablet (3.125 mg total) by mouth daily. 90 tablet 2  . loratadine (CLARITIN) 10 MG tablet Take 10 mg by mouth daily.    [name] losartan (COZAAR) 50 MG tablet Take 1 tablet (50 mg total) by mouth daily. 90 tablet 1  . magnesium 30 MG tablet Take 250 mg by mouth 2 (two) times daily.    . Multiple Vitamin (MULTIVITAMIN) capsule Take 1 capsule by mouth daily.    . nitroGLYCERIN (NITROSTAT) 0.4 MG SL tablet DISSOLVE ONE TABLET UNDER THE TONGUE AS NEEDED FOR CHEST PAIN**MAX 3 TABLETS EACH 5 MINUTES APART** 25 tablet 3  . Omega-3 Krill Oil 500 MG CAPS Take 1 capsule by mouth daily.    [name] omeprazole (PRILOSEC) 40 MG capsule Take 1 capsule (40 mg total) by mouth daily. 90 capsule 3  . simvastatin (ZOCOR) 40 MG tablet Take 1 tablet (40 mg total) by mouth every evening. 90 tablet 3   No current facility-administered medications on file prior to visit.    Allergies  Allergen Reactions  . Codeine   . Lipitor [Atorvastatin] Other (See Comments)    Myalgias  . Lisinopril Cough  . Penicillins   . Pravastatin Other (See Comments)    Myalgias   Social History   Social History  . Marital status: Divorced    Spouse name: N/A  . Number of  children: N/A  . Years of education: N/A   Occupational History  . Not on file.   Social History Main Topics  . Smoking status: Former Smoker    Quit date: 04/21/1983  . Smokeless tobacco: Never Used  . Alcohol use No  . Drug use: No  . Sexual activity: Not on file   Other Topics Concern  . Not on file   Social History Narrative  . No narrative on file      Review of Systems  All other systems reviewed and are negative.      Objective:   Physical Exam  Cardiovascular: Normal rate, regular rhythm and normal heart sounds.   No murmur  heard. Pulmonary/Chest: Effort normal and breath sounds normal. No respiratory distress. She has no wheezes. She has no rales.  Abdominal: Soft. Bowel sounds are normal. She exhibits no distension. There is tenderness. There is no rebound and no guarding.  Vitals reviewed.    TTP in LLQ     Assessment & Plan:  Diverticulitis of large intestine without perforation or abscess without bleeding - Plan: ciprofloxacin (CIPRO) 500 MG tablet, metroNIDAZOLE (FLAGYL) 500 MG tablet, CBC with Differential/Platelet  I suspect diverticulitis. Begin Cipro 500 mg by mouth twice a day for 10 days and Flagyl 500 mg by mouth 3 times a day for 10 days. Recheck in 48 hours if no better or sooner if worse. Check CBC to monitor her white count

## 2017-02-21 ENCOUNTER — Encounter: Payer: Self-pay | Admitting: Family Medicine

## 2017-02-25 ENCOUNTER — Ambulatory Visit (HOSPITAL_COMMUNITY): Payer: Medicare Other

## 2017-02-26 ENCOUNTER — Encounter: Payer: Self-pay | Admitting: *Deleted

## 2017-02-26 NOTE — Telephone Encounter (Signed)
This encounter was created in error - please disregard.

## 2017-02-26 NOTE — Telephone Encounter (Signed)
-----   Message from Lorretta Harp, MD sent at 02/25/2017  8:08 PM EDT ----- Clearly not at goal and worse than last year. Is pt taking his statin??

## 2017-02-27 ENCOUNTER — Other Ambulatory Visit: Payer: Self-pay | Admitting: Family Medicine

## 2017-02-27 NOTE — Telephone Encounter (Signed)
okay

## 2017-02-27 NOTE — Telephone Encounter (Signed)
Ok to refill 

## 2017-02-28 ENCOUNTER — Ambulatory Visit (HOSPITAL_COMMUNITY)
Admission: RE | Admit: 2017-02-28 | Discharge: 2017-02-28 | Disposition: A | Discharge status: Home / Self Care | Payer: Medicare Other | Source: Ambulatory Visit | Attending: Cardiovascular Disease | Admitting: Cardiovascular Disease

## 2017-02-28 DIAGNOSIS — I6523 Occlusion and stenosis of bilateral carotid arteries: Secondary | ICD-10-CM | POA: Insufficient documentation

## 2017-02-28 DIAGNOSIS — I779 Disorder of arteries and arterioles, unspecified: Secondary | ICD-10-CM

## 2017-02-28 DIAGNOSIS — I739 Peripheral vascular disease, unspecified: Secondary | ICD-10-CM

## 2017-02-28 DIAGNOSIS — E785 Hyperlipidemia, unspecified: Secondary | ICD-10-CM | POA: Diagnosis not present

## 2017-02-28 DIAGNOSIS — I10 Essential (primary) hypertension: Secondary | ICD-10-CM | POA: Diagnosis not present

## 2017-02-28 DIAGNOSIS — Z87891 Personal history of nicotine dependence: Secondary | ICD-10-CM | POA: Insufficient documentation

## 2017-03-05 ENCOUNTER — Other Ambulatory Visit: Payer: Self-pay | Admitting: Cardiovascular Disease

## 2017-03-05 ENCOUNTER — Telehealth: Payer: Self-pay | Admitting: Cardiovascular Disease

## 2017-03-05 DIAGNOSIS — I779 Disorder of arteries and arterioles, unspecified: Secondary | ICD-10-CM

## 2017-03-05 DIAGNOSIS — I739 Peripheral vascular disease, unspecified: Principal | ICD-10-CM

## 2017-03-05 NOTE — Telephone Encounter (Signed)
VAS US CAROTID  Order: [id number]  Status:  Final result Visible to patient:  No (Not Released) Dx:  Essential hypertension, benign; Bilat...  Notes recorded by Therisa Doyne on 03/05/2017 at 1:55 PM EDT lmtcb for results. Repeat order entered.  ------  Notes recorded by Lorretta Harp, MD on 03/01/2017 at 6:31 AM EDT No change from prior study. Repeat in 12 months.

## 2017-03-06 ENCOUNTER — Telehealth: Payer: Self-pay | Admitting: Cardiovascular Disease

## 2017-03-06 NOTE — Telephone Encounter (Signed)
New message ° ° ° ° ° °Returning a call to the nurse to get test results °

## 2017-03-20 ENCOUNTER — Encounter: Payer: Self-pay | Admitting: Family Medicine

## 2017-03-21 NOTE — Telephone Encounter (Signed)
lmtcb

## 2017-03-22 ENCOUNTER — Ambulatory Visit: Payer: Medicare Other | Admitting: Family Medicine

## 2017-03-25 ENCOUNTER — Ambulatory Visit: Payer: Medicare Other | Admitting: Family Medicine

## 2017-03-27 NOTE — Progress Notes (Signed)
Yes

## 2017-04-05 ENCOUNTER — Ambulatory Visit (INDEPENDENT_AMBULATORY_CARE_PROVIDER_SITE_OTHER): Payer: Medicare Other | Admitting: Family Medicine

## 2017-04-05 ENCOUNTER — Encounter: Payer: Self-pay | Admitting: Family Medicine

## 2017-04-05 VITALS — BP 132/66 | HR 92 | Temp 98.3°F | Resp 16 | Ht 66.0 in | Wt 193.0 lb

## 2017-04-05 DIAGNOSIS — M21612 Bunion of left foot: Secondary | ICD-10-CM | POA: Diagnosis not present

## 2017-04-05 DIAGNOSIS — M549 Dorsalgia, unspecified: Secondary | ICD-10-CM

## 2017-04-05 DIAGNOSIS — M5136 Other intervertebral disc degeneration, lumbar region: Secondary | ICD-10-CM | POA: Diagnosis not present

## 2017-04-05 DIAGNOSIS — L609 Nail disorder, unspecified: Secondary | ICD-10-CM

## 2017-04-05 MED ORDER — ZOSTER VACCINE LIVE 19400 UNT/0.65ML ~~LOC~~ SUSR: 0.6500 mL | Freq: Once | SUBCUTANEOUS | 0 refills | Status: AC

## 2017-04-05 MED ORDER — TRAMADOL HCL 50 MG PO TABS: 50.0000 mg | ORAL_TABLET | Freq: Three times a day (TID) | ORAL | 0 refills | Status: DC | PRN

## 2017-04-05 MED ORDER — PREDNISONE 10 MG PO TABS: ORAL_TABLET | ORAL | 0 refills | Status: DC

## 2017-04-05 NOTE — Patient Instructions (Addendum)
Call if you want [place] to set up MRI Take the prednisone-  Take the tramadol- pain pill at bedtime as needed F/U Nov for Physical

## 2017-04-05 NOTE — Progress Notes (Signed)
   Subjective:    Patient ID: Lindsey Mullins, female    DOB: 1946/01/11, 71 y.o.   MRN: [id number]  Patient presents for Back Pain and Foot Issues  Chronic back pain, radiating down left side. Occurred during a fall at work. She is still using aspereceme. She has appt with workers comp doctor.This past week she's had difficulty with her back pain she's at work and pain shooting down the left side. She's been taking ibuprofen using Aspercreme using Flexeril though this is helping. She has not had any change in bowel or bladder. She is upset that [employer] has not intervened she has not had an MRI she was supposed to physical therapy disposition never approved.   Left foot- had toenail removed in past callus beneath 3rd tigit, greatoenail , bunion- needs referral to [facility], right greatoeil also very thick, and curved but no pain     Review Of Systems:  GEN- denies fatigue, fever, weight loss,weakness, recent illness HEENT- denies eye drainage, change in vision, nasal discharge, CVS- denies chest pain, palpitations RESP- denies SOB, cough, wheeze ABD- denies N/V, change in stools, abd painj GU- denies dysuria, hematuria, dribbling, incontinence MSK-+joint pain,+ muscle aches, injury Neuro- denies headache, dizziness, syncope, seizure activity       Objective:    BP 132/66   Pulse 92   Temp 98.3 F (36.8 C) (Oral)   Resp 16   Ht '5\' 6"'$  (1.676 m)   Wt 193 lb (87.5 kg)   SpO2 98%   BMI 31.15 kg/m  GEN- NAD, alert and oriented x3 HEENT- PERRL, EOMI, non injected sclera, pink conjunctiva, MMM, oropharynx clear Neck- Supple, no thyromegaly CVS- RRR, no murmur RESP-CTAB MSK- TTP Left paraspinals ,equivical SLR, decreased ROM spine/hips/knees EXT- No edema, bilat bunions, very thick curved great toenails Pulses- Radial, DP- 2+        Assessment & Plan:      Problem List Items Addressed This Visit    DDD (degenerative disc disease), lumbar - Primary   Known DDD worsening pain, with sciatica symptoms This has been progressive for a few years including  The injuries at work. She has been holding off thinking workers comp was going to provide treatment and imaging. She will discuss with them as planned next week. If they decline further treatment, I think we need to proceed with MRI of lumbar spine,likely some PT/spine consult In meantime given Ultram, prednisone       Relevant Medications   traMADol (ULTRAM) 50 MG tablet   predniSONE (DELTASONE) 10 MG tablet   Bunion, left    Referral to podiatry has bilat bunions thick hypertrophic nails causing some pain        Other Visit Diagnoses    Back pain with radiation       Relevant Medications   traMADol (ULTRAM) 50 MG tablet   predniSONE (DELTASONE) 10 MG tablet   Nail problem          Note: This dictation was prepared with Dragon dictation along with smaller phrase technology. Any transcriptional errors that result from this process are unintentional.

## 2017-04-07 ENCOUNTER — Encounter: Payer: Self-pay | Admitting: Family Medicine

## 2017-04-07 NOTE — Assessment & Plan Note (Signed)
Known DDD worsening pain, with sciatica symptoms This has been progressive for a few years including  The injuries at work. She has been holding off thinking workers comp was going to provide treatment and imaging. She will discuss with them as planned next week. If they decline further treatment, I think we need to proceed with MRI of lumbar spine,likely some PT/spine consult In meantime given Ultram, prednisone

## 2017-04-07 NOTE — Assessment & Plan Note (Signed)
Referral to podiatry has bilat bunions thick hypertrophic nails causing some pain

## 2017-04-09 ENCOUNTER — Other Ambulatory Visit: Payer: Self-pay | Admitting: *Deleted

## 2017-04-09 MED ORDER — ZOSTER VAC RECOMB ADJUVANTED 50 MCG/0.5ML IM SUSR: 0.5000 mL | Freq: Once | INTRAMUSCULAR | 0 refills | Status: AC

## 2017-05-17 ENCOUNTER — Ambulatory Visit: Payer: Medicare Other

## 2017-05-17 ENCOUNTER — Encounter: Payer: Self-pay | Admitting: Podiatry

## 2017-05-17 ENCOUNTER — Ambulatory Visit (INDEPENDENT_AMBULATORY_CARE_PROVIDER_SITE_OTHER): Payer: Medicare Other | Admitting: Podiatry

## 2017-05-17 DIAGNOSIS — M79675 Pain in left toe(s): Secondary | ICD-10-CM

## 2017-05-17 DIAGNOSIS — M2012 Hallux valgus (acquired), left foot: Secondary | ICD-10-CM

## 2017-05-17 DIAGNOSIS — M79672 Pain in left foot: Secondary | ICD-10-CM

## 2017-05-17 DIAGNOSIS — M79674 Pain in right toe(s): Secondary | ICD-10-CM

## 2017-05-17 DIAGNOSIS — B351 Tinea unguium: Secondary | ICD-10-CM | POA: Diagnosis not present

## 2017-05-17 DIAGNOSIS — Q828 Other specified congenital malformations of skin: Secondary | ICD-10-CM

## 2017-05-17 NOTE — Progress Notes (Signed)
   Subjective:    Patient ID: Lindsey Mullins, female    DOB: 06-Aug-1946, 71 y.o.   MRN: [id number]  HPI  71 year old female presents the office today for concerns of thick, painful, elongated nails that she cannot trim herself. She states a left big toenail removed several years ago but did come back in the right toenail is a thick and discolored. Her other nails are also discolored and thick and causing irritation shoes. She denies any redness or drainage. She does is a callus in the ball of her left foot which is causing pain. Denies any redness or drainage or any swelling. She has noticed planes.   Review of Systems  Musculoskeletal:       Joint pain on my left hip  All other systems reviewed and are negative.      Objective:   Physical Exam General: AAO x3, NAD  Dermatological: Nails are hypertrophic, dystrophic, brittle, discolored, elongated 10. No surrounding redness or drainage. Tenderness nails 1-5 bilaterally. Hyperkeratotic lesion present left some metatarsal 2. Upon debridement there is no underlying ulceration, drainage or any clinical signs of infection. No open lesions or pre-ulcerative lesions are identified today.  Vascular: Dorsalis Pedis artery and Posterior Tibial artery pedal pulses are 2/4 bilateral with immedate capillary fill time.  There is no pain with calf compression, swelling, warmth, erythema.   Neruologic: Grossly intact via light touch bilateral. Protective threshold with Semmes Wienstein monofilament intact to all pedal sites bilateral.   Musculoskeletal: HAV is present.  No pain, crepitus, or limitation noted with foot and ankle range of motion bilateral. Muscular strength 5/5 in all groups tested bilateral.  Gait: Unassisted, Nonantalgic.      Assessment & Plan:  71 year old female symptomatic onychomycosis, hyperkeratotic lesion -Treatment options discussed including all alternatives, risks, and complications -Etiology of symptoms were  discussed -Nails debrided 10 without complications or bleeding. -Hyperkeratotic lesion sharply debrided 1 without complications or bleeding. -Daily foot inspection -Follow-up in 3 months or sooner if any problems arise. In the meantime, encouraged to call the office with any questions, concerns, change in symptoms.   Celesta Gentile, DPM

## 2017-07-12 ENCOUNTER — Other Ambulatory Visit: Payer: Self-pay | Admitting: Family Medicine

## 2017-07-12 NOTE — Telephone Encounter (Signed)
Okay to refill? 

## 2017-07-12 NOTE — Telephone Encounter (Signed)
Ok to refill 

## 2017-07-15 NOTE — Telephone Encounter (Signed)
Prescription sent to pharmacy.

## 2017-07-24 ENCOUNTER — Other Ambulatory Visit: Payer: Self-pay | Admitting: Family Medicine

## 2017-07-31 ENCOUNTER — Ambulatory Visit (INDEPENDENT_AMBULATORY_CARE_PROVIDER_SITE_OTHER): Payer: Medicare Other | Admitting: Family Medicine

## 2017-07-31 ENCOUNTER — Encounter: Payer: Self-pay | Admitting: Family Medicine

## 2017-07-31 VITALS — BP 140/88 | HR 81 | Temp 97.8°F | Resp 12 | Ht 66.0 in | Wt 190.6 lb

## 2017-07-31 DIAGNOSIS — K219 Gastro-esophageal reflux disease without esophagitis: Secondary | ICD-10-CM

## 2017-07-31 DIAGNOSIS — M75101 Unspecified rotator cuff tear or rupture of right shoulder, not specified as traumatic: Secondary | ICD-10-CM | POA: Diagnosis not present

## 2017-07-31 DIAGNOSIS — I10 Essential (primary) hypertension: Secondary | ICD-10-CM | POA: Diagnosis not present

## 2017-07-31 DIAGNOSIS — E785 Hyperlipidemia, unspecified: Secondary | ICD-10-CM | POA: Diagnosis not present

## 2017-07-31 MED ORDER — CARVEDILOL 3.125 MG PO TABS: 3.1250 mg | ORAL_TABLET | Freq: Every day | ORAL | 2 refills | Status: DC

## 2017-07-31 MED ORDER — SIMVASTATIN 40 MG PO TABS: 40.0000 mg | ORAL_TABLET | Freq: Every evening | ORAL | 3 refills | Status: DC

## 2017-07-31 MED ORDER — LOSARTAN POTASSIUM 50 MG PO TABS: 25.0000 mg | ORAL_TABLET | Freq: Every day | ORAL | 1 refills | Status: DC

## 2017-07-31 MED ORDER — RANITIDINE HCL 150 MG PO CAPS: 150.0000 mg | ORAL_CAPSULE | Freq: Two times a day (BID) | ORAL | 6 refills | Status: DC

## 2017-07-31 NOTE — Assessment & Plan Note (Signed)
Continues to use Zantac refilled

## 2017-07-31 NOTE — Progress Notes (Signed)
Subjective:    Patient ID: Lindsey Mullins, female    DOB: [birth year], 71 y.o.   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 She is now able to move her arm around but still gets some pain over the biceps area and the top of her shoulder.  HTN- not taking losartan had dizziness after taking her last script picked up in July, pill looked different so she became scared. She only took one pill and has not taken it again. Her blood pressure has been elevated she denies any chest pain or shortness of breath.  Hyperlipidemia she is due for repeat labs she is now taking a statin drug regularly  Review Of Systems:  GEN- denies fatigue, fever, weight loss,weakness, recent illness HEENT- denies eye drainage, change in vision, nasal discharge, CVS- denies chest pain, palpitations RESP- denies SOB, cough, wheeze ABD- denies N/V, change in stools, abd pain GU- denies dysuria, hematuria, dribbling, incontinence MSK- + joint pain, muscle aches, injury Neuro- denies headache, dizziness, syncope, seizure activity       Objective:    BP 140/88   Pulse 81   Temp 97.8 F (36.6 C) (Oral)   Resp 12   Ht 5\' 6"  (1.676 m)   Wt 190 lb 9.6 oz (86.5 kg)   SpO2 98%   BMI 30.76 kg/m  GEN- NAD, alert and oriented x3 HEENT- PERRL, EOMI, non injected sclera, pink conjunctiva, MMM, oropharynx clear Neck- Supple, good ROM  CVS- RRR, no murmur RESP-CTAB MSK- Normal inspection of bilat UE, FROM upper ext, pain with elevation right side , equvical Empty can, neg impingment, biceps in tact, mild TTP over deltoid and biceps EXT- No edema Pulses- Radial 2+        Assessment & Plan:      Problem List Items Addressed This Visit      Unprioritized   Hyperlipidemia   Relevant Medications   losartan (COZAAR) 50 MG tablet   simvastatin (ZOCOR) 40 MG tablet   carvedilol (COREG) 3.125 MG tablet   Other Relevant Orders   Comprehensive metabolic panel   Lipid panel   GERD (gastroesophageal reflux disease) - Primary    Continues to use Zantac refilled      Relevant Medications   ranitidine (ZANTAC) 150 MG capsule   Essential hypertension, benign    Discussed importance of taking her medications. We'll try her on 25 mg she can break the losartan and half. Unsure if the dizziness is actually related to the new pill or she just had a spell of dizziness. Will also continue her carvedilol.      Relevant Medications   losartan (COZAAR) 50 MG tablet   simvastatin (ZOCOR) 40 MG tablet   carvedilol (COREG) 3.125 MG tablet   Other Relevant Orders   Comprehensive metabolic panel    Other Visit Diagnoses    Rotator cuff syndrome of right shoulder       Overuse with moving furniture and moving mother. ROM and pain has improved. continue ibuprofen 400mg  BID for next week, use topical, discussed exercises.If this worsens will set her up eith her orthopedist      Note: This dictation was prepared with Dragon dictation along with smaller phrase technology. Any transcriptional errors that  result from this process are unintentional.

## 2017-07-31 NOTE — Patient Instructions (Addendum)
Take 1/2 tablet of the losartan for blood pressure Take the ibuprofen 2 tablets twice a day  Continue the aspercreme  We will call with lab results  F/U as previous

## 2017-07-31 NOTE — Assessment & Plan Note (Signed)
Discussed importance of taking her medications. We'll try her on 25 mg she can break the losartan and half. Unsure if the dizziness is actually related to the new pill or she just had a spell of dizziness. Will also continue her carvedilol.

## 2017-08-01 LAB — COMPREHENSIVE METABOLIC PANEL
AG Ratio: 1.2 (calc) (ref 1.0–2.5)
ALT: 10 U/L (ref 6–29)
AST: 25 U/L (ref 10–35)
Albumin: 4 g/dL (ref 3.6–5.1)
Alkaline phosphatase (APISO): 93 U/L (ref 33–130)
BUN: 11 mg/dL (ref 7–25)
CO2: 22 mmol/L (ref 20–32)
Calcium: 9.1 mg/dL (ref 8.6–10.4)
Chloride: 105 mmol/L (ref 98–110)
Creat: 0.89 mg/dL (ref 0.60–0.93)
GLUCOSE: 95 mg/dL (ref 65–99)
Globulin: 3.3 g/dL (calc) (ref 1.9–3.7)
Potassium: 3.5 mmol/L (ref 3.5–5.3)
SODIUM: 139 mmol/L (ref 135–146)
TOTAL PROTEIN: 7.3 g/dL (ref 6.1–8.1)
Total Bilirubin: 0.5 mg/dL (ref 0.2–1.2)

## 2017-08-01 LAB — LIPID PANEL
Cholesterol: 168 mg/dL (ref <200)
HDL: 61 mg/dL (ref >50)
LDL CHOLESTEROL (CALC): 87 mg/dL
NON-HDL CHOLESTEROL (CALC): 107 mg/dL (ref <130)
TRIGLYCERIDES: 102 mg/dL (ref <150)
Total CHOL/HDL Ratio: 2.8 (calc) (ref <5.0)

## 2017-10-14 ENCOUNTER — Encounter: Payer: Medicare Other | Admitting: Family Medicine

## 2017-10-15 ENCOUNTER — Encounter: Payer: Self-pay | Admitting: Family Medicine

## 2017-11-30 ENCOUNTER — Other Ambulatory Visit: Payer: Self-pay | Admitting: Cardiovascular Disease

## 2017-12-02 NOTE — Telephone Encounter (Signed)
Rx(s) sent to pharmacy electronically.  

## 2017-12-10 ENCOUNTER — Other Ambulatory Visit: Payer: Self-pay

## 2017-12-10 ENCOUNTER — Ambulatory Visit: Payer: Medicare Other | Admitting: Family Medicine

## 2017-12-10 ENCOUNTER — Encounter: Payer: Self-pay | Admitting: Family Medicine

## 2017-12-10 VITALS — BP 138/82 | HR 74 | Temp 97.9°F | Resp 16 | Ht 66.0 in | Wt 193.0 lb

## 2017-12-10 DIAGNOSIS — M25551 Pain in right hip: Secondary | ICD-10-CM | POA: Diagnosis not present

## 2017-12-10 DIAGNOSIS — M5136 Other intervertebral disc degeneration, lumbar region: Secondary | ICD-10-CM | POA: Diagnosis not present

## 2017-12-10 DIAGNOSIS — W19XXXA Unspecified fall, initial encounter: Secondary | ICD-10-CM

## 2017-12-10 MED ORDER — CYCLOBENZAPRINE HCL 5 MG PO TABS
ORAL_TABLET | ORAL | 0 refills | Status: DC
Start: 2017-12-10 — End: 2018-06-09

## 2017-12-10 MED ORDER — METHYLPREDNISOLONE 4 MG PO TBPK: ORAL_TABLET | ORAL | 0 refills | Status: DC

## 2017-12-10 NOTE — Progress Notes (Signed)
   Subjective:    Patient ID: Lindsey Mullins, female    DOB: 1946/03/26, 72 y.o.   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 Her mother who requires 24 hour care fell in the floor again 3 days ago, she and brother were trying to pick her up, she tried to hoist her up but brother did not move at the same time. 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 and this caused increased pain    Review Of Systems:  GEN- denies fatigue, fever, weight loss,weakness, recent illness HEENT- denies eye drainage, change in vision, nasal discharge, CVS- denies chest pain, palpitations RESP- denies SOB, cough, wheeze ABD- denies N/V, change in stools, abd pain GU- denies dysuria, hematuria, dribbling, incontinence MSK- + joint pain, muscle aches, injury Neuro- denies headache, dizziness, syncope, seizure activity       Objective:    BP 138/82   Pulse 74   Temp 97.9 F (36.6 C) (Oral)   Resp 16   Ht 5\' 6"  (1.676 m)   Wt 193 lb (87.5 kg)   SpO2 100%   BMI 31.15 kg/m  GEN- NAD, alert and oriented x3 MSK- Neck good ROM, NT Mild TTP lumbar spine, fair ROM, Fair ROM HIPS, Pain with IR, fair ROM bilat knee, no effusion No bruising on back. Neg Hip Rock, + spasm in parapinals , neg SLR  slow gait, antalgic, but moving legs equally Neuro- normal tone LE, sensation in tact         Assessment & Plan:      Problem List Items Addressed This Visit      Unprioritized   DDD (degenerative disc disease), lumbar   Relevant Medications   cyclobenzaprine (FLEXERIL) 5 MG tablet   methylPREDNISolone (MEDROL DOSEPAK) 4 MG TBPK tablet    Other Visit Diagnoses    Right hip pain    -  Primary   S/P Fall, with some improvement already. Pop may have been ligament or joint poppping, at hip. She has fair ROM of all joints but weight bearing. She has some spasm in back but not severe tenderness over vertebrae. Will give her time out of work. Use medrol dose pak, and flexeril She does not like the feeling of Ultram or stronger pain meds Can also use tylenol ince improving already, will wait a couple days, and see how she does. Then image as needed   Fall, initial encounter          Note: This dictation was prepared with Dragon dictation along with smaller phrase technology. Any transcriptional errors that result from this process are unintentional.

## 2017-12-10 NOTE — Patient Instructions (Signed)
Take the steroids Take the flexeril  Call me Friday If not better Try heating pain

## 2017-12-16 ENCOUNTER — Ambulatory Visit
Admission: RE | Admit: 2017-12-16 | Discharge: 2017-12-16 | Disposition: A | Discharge status: Home / Self Care | Payer: Medicare Other | Source: Ambulatory Visit | Attending: Family Medicine | Admitting: Family Medicine

## 2017-12-16 ENCOUNTER — Encounter: Payer: Self-pay | Admitting: Family Medicine

## 2017-12-16 ENCOUNTER — Ambulatory Visit (INDEPENDENT_AMBULATORY_CARE_PROVIDER_SITE_OTHER): Payer: Medicare Other | Admitting: Family Medicine

## 2017-12-16 ENCOUNTER — Other Ambulatory Visit: Payer: Self-pay

## 2017-12-16 VITALS — BP 142/88 | HR 82 | Temp 98.0°F | Resp 16 | Ht 66.0 in | Wt 193.0 lb

## 2017-12-16 DIAGNOSIS — M25551 Pain in right hip: Secondary | ICD-10-CM

## 2017-12-16 DIAGNOSIS — M5136 Other intervertebral disc degeneration, lumbar region: Secondary | ICD-10-CM | POA: Diagnosis not present

## 2017-12-16 DIAGNOSIS — M160 Bilateral primary osteoarthritis of hip: Secondary | ICD-10-CM

## 2017-12-16 MED ORDER — TRAMADOL HCL 50 MG PO TABS: 50.0000 mg | ORAL_TABLET | Freq: Three times a day (TID) | ORAL | 0 refills | Status: DC | PRN

## 2017-12-16 NOTE — Addendum Note (Signed)
Addended by: Vic Blackbird F on: 12/16/2017 04:56 PM   Modules accepted: Orders

## 2017-12-16 NOTE — Progress Notes (Signed)
   Subjective:    Patient ID: Lindsey Mullins, female    DOB: 10-Feb-1946, 72 y.o.   MRN: [id number]  Patient presents for R Hip Pain (pain around R buttocks, hip and lower abd- reports medication is ineffective)  Here with continued pain status post her fall a week and a half ago.  She was seen 3 days after the fall she was still having right hip pain states she felt a pop when she fell over trying to help get her mother up out of the floor.  She did not have any abdominal discomfort.  It was more lower back and hip.  However she was already improving.  She is been taking ibuprofen. She was given Medrol Dosepak as well as Flexeril for the muscle spasms.  She was given some time out of work as she still works as a English as a second language teacher and it actually tweaked her back more she was trying to get a handicap lift to work. She was unable to get the flexeril. Pain into right groin and around to the lower back . Taking tylenol and iburpofen recently . Pain with walking and sitting, has a burning sensation in groin at times No change in bowel or bladder.  Review Of Systems:  GEN- denies fatigue, fever, weight loss,weakness, recent illness HEENT- denies eye drainage, change in vision, nasal discharge, CVS- denies chest pain, palpitations RESP- denies SOB, cough, wheeze ABD- denies N/V, change in stools, abd pain GU- denies dysuria, hematuria, dribbling, incontinence MSK- denies joint pain, muscle aches, injury Neuro- denies headache, dizziness, syncope, seizure activity       Objective:    BP (!) 142/88   Pulse 82   Temp 98 F (36.7 C) (Oral)   Resp 16   Ht 5\' 6"  (1.676 m)   Wt 193 lb (87.5 kg)   SpO2 99%   BMI 31.15 kg/m  GEN- NAD, alert and oriented x3 CVS- RRR, no murmur RESP-CTAB ABD-NABS,soft,NT,ND MSK- TTP lumbar spine, fair ROM, Fair ROM HIPS, Pain with IR right side , fair ROM bilat knee, no effusion No bruising on back. Neg Hip Rock, + spasm in parapinals , neg SLR  slow gait, antalgic,   Neuro- normal tone LE, sensation in tact  Skin - in tact         Assessment & Plan:      Problem List Items Addressed This Visit      Unprioritized   DDD (degenerative disc disease), lumbar   Relevant Medications   traMADol (ULTRAM) 50 MG tablet   Other Relevant Orders   DG Lumbar Spine Complete   DG HIPS BILAT WITH PELVIS 3-4 VIEWS    Other Visit Diagnoses    Right hip pain    -  Primary   S/P fall, send for xray of hip and pelvis and lumbar spine, worsening pain. Given ultram, will likely need orthopedics as well. r/o fracture    Relevant Orders   DG Lumbar Spine Complete   DG HIPS BILAT WITH PELVIS 3-4 VIEWS      Note: This dictation was prepared with Dragon dictation along with smaller phrase technology. Any transcriptional errors that result from this process are unintentional.

## 2017-12-16 NOTE — Patient Instructions (Addendum)
Get the xray of your lumbar spine and pelvis  Take the pain medication  Referral to orthopedics  F/U pending results

## 2017-12-17 ENCOUNTER — Emergency Department (HOSPITAL_COMMUNITY): Payer: Medicare Other

## 2017-12-17 ENCOUNTER — Encounter (HOSPITAL_COMMUNITY): Payer: Self-pay | Admitting: Emergency Medicine

## 2017-12-17 ENCOUNTER — Emergency Department (HOSPITAL_COMMUNITY)
Admission: EM | Admit: 2017-12-17 | Discharge: 2017-12-18 | Disposition: A | Discharge status: Home / Self Care | Payer: Medicare Other | Attending: Emergency Medicine | Admitting: Emergency Medicine

## 2017-12-17 ENCOUNTER — Other Ambulatory Visit: Payer: Self-pay

## 2017-12-17 DIAGNOSIS — Z880 Allergy status to penicillin: Secondary | ICD-10-CM | POA: Diagnosis not present

## 2017-12-17 DIAGNOSIS — I10 Essential (primary) hypertension: Secondary | ICD-10-CM | POA: Insufficient documentation

## 2017-12-17 DIAGNOSIS — Z79899 Other long term (current) drug therapy: Secondary | ICD-10-CM | POA: Diagnosis not present

## 2017-12-17 DIAGNOSIS — R0789 Other chest pain: Secondary | ICD-10-CM | POA: Diagnosis not present

## 2017-12-17 DIAGNOSIS — Z8673 Personal history of transient ischemic attack (TIA), and cerebral infarction without residual deficits: Secondary | ICD-10-CM | POA: Diagnosis not present

## 2017-12-17 DIAGNOSIS — Z885 Allergy status to narcotic agent status: Secondary | ICD-10-CM | POA: Insufficient documentation

## 2017-12-17 DIAGNOSIS — Z789 Other specified health status: Secondary | ICD-10-CM

## 2017-12-17 DIAGNOSIS — Z87891 Personal history of nicotine dependence: Secondary | ICD-10-CM | POA: Diagnosis not present

## 2017-12-17 DIAGNOSIS — Z7982 Long term (current) use of aspirin: Secondary | ICD-10-CM | POA: Diagnosis not present

## 2017-12-17 DIAGNOSIS — R109 Unspecified abdominal pain: Secondary | ICD-10-CM | POA: Diagnosis present

## 2017-12-17 LAB — CBC WITH DIFFERENTIAL/PLATELET
BASOS ABS: 0 10*3/uL (ref 0.0–0.1)
Basophils Relative: 0 %
Eosinophils Absolute: 0.2 10*3/uL (ref 0.0–0.7)
Eosinophils Relative: 2 %
HEMATOCRIT: 38.1 % (ref 36.0–46.0)
Hemoglobin: 12.2 g/dL (ref 12.0–15.0)
LYMPHS PCT: 25 %
Lymphs Abs: 2.8 10*3/uL (ref 0.7–4.0)
MCH: 29.8 pg (ref 26.0–34.0)
MCHC: 32 g/dL (ref 30.0–36.0)
MCV: 93.2 fL (ref 78.0–100.0)
Monocytes Absolute: 0.8 10*3/uL (ref 0.1–1.0)
Monocytes Relative: 7 %
NEUTROS ABS: 7.4 10*3/uL (ref 1.7–7.7)
Neutrophils Relative %: 66 %
Platelets: 307 10*3/uL (ref 150–400)
RBC: 4.09 MIL/uL (ref 3.87–5.11)
RDW: 13.6 % (ref 11.5–15.5)
WBC: 11.2 10*3/uL — AB (ref 4.0–10.5)

## 2017-12-17 LAB — I-STAT CHEM 8, ED
BUN: 18 mg/dL (ref 6–20)
CHLORIDE: 99 mmol/L — AB (ref 101–111)
CREATININE: 0.7 mg/dL (ref 0.44–1.00)
Calcium, Ion: 1.03 mmol/L — ABNORMAL LOW (ref 1.15–1.40)
GLUCOSE: 108 mg/dL — AB (ref 65–99)
HCT: 42 % (ref 36.0–46.0)
Hemoglobin: 14.3 g/dL (ref 12.0–15.0)
POTASSIUM: 5.5 mmol/L — AB (ref 3.5–5.1)
Sodium: 135 mmol/L (ref 135–145)
TCO2: 28 mmol/L (ref 22–32)

## 2017-12-17 LAB — I-STAT TROPONIN, ED: Troponin i, poc: 0.01 ng/mL (ref 0.00–0.08)

## 2017-12-17 LAB — D-DIMER, QUANTITATIVE: D-Dimer, Quant: 0.61 ug/mL-FEU — ABNORMAL HIGH (ref 0.00–0.50)

## 2017-12-17 LAB — CBG MONITORING, ED: Glucose-Capillary: 94 mg/dL (ref 65–99)

## 2017-12-17 NOTE — ED Notes (Signed)
MD Eulis Foster notified pt is feeling dizzy at this time, CBG and BP checked. MD to evaluate pt.

## 2017-12-17 NOTE — ED Notes (Signed)
MD made aware of delay in blood collection. Pt now allowing staff to stick AC. Lab called to attempt.

## 2017-12-17 NOTE — ED Notes (Signed)
Pt states she takes BP meds at night and has not taken them at this time.

## 2017-12-17 NOTE — ED Notes (Signed)
Pt states she took 1/2 her prescribed Tramadol this morning with breakfast and felt fine, this evening took it and began to feel lightheaded. States she did not eat lunch and had some peanut butter crackers between breakfast and taking the medication. Denies N/V/, throat swelling, LOC, SOB, or CP.

## 2017-12-17 NOTE — ED Notes (Signed)
Lab at bedside attempting lab draw.

## 2017-12-17 NOTE — ED Triage Notes (Signed)
Patient given Tramadol for pain yesterday. Took 1/2 this am and took the other half 30 minutes ago. Patient reports burning in her chest and feeling like she is going to "pass out."

## 2017-12-17 NOTE — ED Provider Notes (Signed)
[facility] EMERGENCY DEPARTMENT Provider Note   CSN: [id number] Arrival date & time: 12/17/17  1721     History   Chief Complaint Chief Complaint  Patient presents with  . Allergic Reaction    HPI Lindsey Mullins is a 72 y.o. female.  She presents for evaluation of abdominal discomfort and burning in her chest following taking 2 doses of tramadol today.  She states the first dose this morning did not bother her, 1/2 tablet.  Around 5 PM tonight she took another half and began to notice the discomfort.  She felt like she was "going to pass out."  She denies fever, chills, nausea or vomiting.  Her PCP started her on tramadol, after an appointment yesterday for low back pain.  She reports similar problems when taking narcotics in the past.  She denies dysuria, urinary frequency, focal weakness or paresthesia.  There are no other known modifying factors.  HPI  Past Medical History:  Diagnosis Date  . Atypical chest pain 05/23/2012   STRESS TEST - small to moderate sized area of partial reversibility of the anteroapical wall, most likely breast artifact, post-stress EF 67%, EKG show NSR at 65, No Lexiscan EKG changes, non-diagnostic for ischemia; STRESS TEST, 01/25/2010 - normal study, post-stress EF 66%, no significant ischemia  . Cerebral atherosclerosis 09/29/2012   CAROTID DUPLEX - RIGHT  BULB/PROXIMAL ICA-moderate amount of fibrous plaque, 50-69% diameter reduction; LEFT CEA-normal, no significant diameter reduction  . GERD (gastroesophageal reflux disease)   . Hyperlipidemia   . Hypertension   . Restless leg syndrome   . Shortness of breath 03/30/2005   2D ECHO - EF >55%, normal  . TIA (transient ischemic attack) 01/27/2010   2D ECHO - EF 65%, normal    Patient Active Problem List   Diagnosis Date Noted  . DDD (degenerative disc disease), lumbar 05/11/2016  . Obesity 05/11/2016  . Glucose intolerance (impaired glucose tolerance) 01/12/2016  . Pain in the chest   . Chest pain  01/10/2016  . GERD (gastroesophageal reflux disease) 01/26/2015  . Stress and adjustment reaction 01/26/2015  . Dysuria 11/30/2013  . Carotid artery disease ([place]) 04/20/2013  . Bunion, left 09/21/2012  . Overweight(278.02) 06/04/2012  . Back pain 04/18/2012  . Essential hypertension, benign 04/02/2012  . Hyperlipidemia 04/02/2012  . Dysphagia 04/02/2012  . RLS (restless legs syndrome) 04/02/2012    Past Surgical History:  Procedure Laterality Date  . ABDOMINAL EXPLORATION SURGERY    . CAROTID ENDARTERECTOMY    . Peripheral Vascular Catheterization  07/16/2005   Right verterbral-50% smooth segmental badsilar artery stenosis on right side, Right carotid-50% ulcerated proxmial stenosis, Left carotid-60 to 70% left externam carotid artery stenosis, 90% proximal left ICA stenosis    OB History    Gravida Para Term Preterm AB Living   2 2 2          SAB TAB Ectopic Multiple Live Births                   Home Medications    Prior to Admission medications   Medication Sig Start Date End Date Taking? Authorizing Provider  aspirin 81 MG tablet Take 81 mg by mouth every evening.     [provider]  carvedilol (COREG) 3.125 MG tablet Take 1 tablet (3.125 mg total) by mouth daily. 07/31/17   Linton, Modena Nunnery, MD  cyclobenzaprine (FLEXERIL) 5 MG tablet TAKE 1 TABLET BY MOUTH AT BEDTIME AS NEEDED FOR MUSCLE SPASM OR  LEG  CRAMPS  12/10/17   [name], MD  loratadine (CLARITIN) 10 MG tablet Take 10 mg by mouth daily.    [provider]  losartan (COZAAR) 50 MG tablet Take 0.5 tablets (25 mg total) by mouth daily. 07/31/17   [name], MD  losartan (COZAAR) 50 MG tablet Take 0.5 tablets (25 mg total) by mouth daily. <PLEASE MAKE APPOINTMENT FOR REFILLS> 12/02/17   Lorretta Harp, MD  magnesium 30 MG tablet Take 250 mg by mouth 2 (two) times daily.    [provider]  methylPREDNISolone (MEDROL DOSEPAK) 4 MG TBPK tablet Take as directed on package  12/10/17   [name], MD  Multiple Vitamin (MULTIVITAMIN) capsule Take 1 capsule by mouth daily.    [provider]  nitroGLYCERIN (NITROSTAT) 0.4 MG SL tablet DISSOLVE ONE TABLET UNDER THE TONGUE AS NEEDED FOR CHEST PAIN**MAX 3 TABLETS EACH 5 MINUTES APART** 09/14/16   Underwood, Modena Nunnery, MD  Omega-3 Krill Oil 500 MG CAPS Take 1 capsule by mouth daily.    [provider]  ranitidine (ZANTAC) 150 MG capsule Take 1 capsule (150 mg total) by mouth 2 (two) times daily. 07/31/17   [name], MD  simvastatin (ZOCOR) 40 MG tablet Take 1 tablet (40 mg total) by mouth every evening. 07/31/17   [place], Modena Nunnery, MD  traMADol (ULTRAM) 50 MG tablet Take 1 tablet (50 mg total) by mouth every 8 (eight) hours as needed. 12/16/17   [name], MD    Family History Family History  Problem Relation Age of Onset  . Heart disease Mother   . Hypertension Mother   . Hypertension Father   . Heart disease Father     Social History Social History   Tobacco Use  . Smoking status: Former Smoker    Last attempt to quit: 04/21/1983    Years since quitting: 34.6  . Smokeless tobacco: Never Used  Substance Use Topics  . Alcohol use: No  . Drug use: No     Allergies   Codeine; Lipitor [atorvastatin]; Lisinopril; Penicillins; and Pravastatin   Review of Systems Review of Systems  All other systems reviewed and are negative.    Physical Exam Updated Vital Signs BP (!) 180/86   Pulse 76   Temp 97.6 F (36.4 C) (Oral)   Resp 14   Ht 5\' 6"  (1.676 m)   Wt 87.5 kg (193 lb)   SpO2 100%   BMI 31.15 kg/m   Physical Exam  Constitutional: She is oriented to person, place, and time. She appears well-developed. No distress.  Elderly, frail  HENT:  Head: Normocephalic and atraumatic.  Right Ear: External ear normal.  Left Ear: External ear normal.  Eyes: Conjunctivae and EOM are normal. Pupils are equal, round, and reactive to light.  Neck: Normal range of motion  and phonation normal. Neck supple.  Cardiovascular: Normal rate, regular rhythm and normal heart sounds.  Pulmonary/Chest: Effort normal and breath sounds normal. She exhibits no bony tenderness.  Abdominal: Soft. There is no tenderness.  Musculoskeletal: Normal range of motion.  Neurological: She is alert and oriented to person, place, and time. No cranial nerve deficit or sensory deficit. She exhibits normal muscle tone. Coordination normal.  No dysarthria or aphasia.  Skin: Skin is warm, dry and intact.  Psychiatric: She has a normal mood and affect. Her behavior is normal. Judgment and thought content normal.  Nursing note and vitals reviewed.    ED Treatments / Results  Labs (  all labs ordered are listed, but only abnormal results are displayed) Labs Reviewed  CBC WITH DIFFERENTIAL/PLATELET - Abnormal; Notable for the following components:      Result Value   WBC 11.2 (*)    All other components within normal limits  D-DIMER, QUANTITATIVE (NOT AT [facility]) - Abnormal; Notable for the following components:   D-Dimer, Quant 0.61 (*)    All other components within normal limits  I-STAT CHEM 8, ED - Abnormal; Notable for the following components:   Potassium 5.5 (*)    Chloride 99 (*)    Glucose, Bld 108 (*)    Calcium, Ion 1.03 (*)    All other components within normal limits  CBG MONITORING, ED  I-STAT TROPONIN, ED    EKG  EKG Interpretation  Date/Time:  Tuesday December 17 2017 17:43:40 EST Ventricular Rate:  71 PR Interval:  170 QRS Duration: 78 QT Interval:  396 QTC Calculation: 430 R Axis:   8 Text Interpretation:  Normal sinus rhythm Normal ECG since last tracing no significant change Confirmed by Daleen Bo [id number]) on 12/17/2017 5:48:53 PM        Radiology Dg Lumbar Spine Complete  Result Date: 12/16/2017 CLINICAL DATA:  Low back pain, bilateral hip pain EXAM: LUMBAR SPINE - COMPLETE 4+ VIEW COMPARISON:  04/12/2017 FINDINGS: There are 5 nonrib bearing  lumbar-type vertebral bodies. The vertebral body heights are maintained. 4 mm anterolisthesis of L4 on L5. There is no spondylolysis. There is no acute fracture. Mild degenerative disc disease with disc height loss at L4-5 and L5-S1. Bilateral facet arthropathy at L3-4, L4-5 and L5-S1. The SI joints are unremarkable. There is abdominal aortic atherosclerosis. IMPRESSION: Lumbar spine spondylosis similar in appearance to the prior exam. Electronically Signed   By: Kathreen Devoid   On: 12/16/2017 15:58   Dg Chest Port 1 View  Result Date: 12/17/2017 CLINICAL DATA:  Allergic reaction, dizziness and burning in chest. History of atypical chest pain, hypertension, shortness of breath, former smoker. EXAM: PORTABLE CHEST 1 VIEW COMPARISON:  Chest x-ray dated 01/10/2016. FINDINGS: The heart size and mediastinal contours are within normal limits. Both lungs are clear. The visualized skeletal structures are unremarkable. IMPRESSION: No active disease. Electronically Signed   By: Franki Cabot M.D.   On: 12/17/2017 21:07   Dg Hips Bilat With Pelvis 3-4 Views  Result Date: 12/16/2017 CLINICAL DATA:  Low back pain and bilateral hip pain, right greater than left. Degenerative disc disease. EXAM: DG HIP (WITH OR WITHOUT PELVIS) 3-4V BILAT COMPARISON:  None. FINDINGS: Hips are located bilaterally. No acute fracture is present. Moderate degenerative changes are noted at the SI joints bilaterally. IMPRESSION: 1. No acute or focal abnormality at the hips. 2. Moderate bilateral SI degenerative changes. Electronically Signed   By: San Morelle M.D.   On: 12/16/2017 15:54    Procedures Procedures (including critical care time)  Medications Ordered in ED Medications - No data to display   Initial Impression / Assessment and Plan / ED Course  I have reviewed the triage vital signs and the nursing notes.  Pertinent labs & imaging results that were available during my care of the patient were reviewed by me and  considered in my medical decision making (see chart for details).  Clinical Course as of Dec 18 5  Tue Dec 17, 2017  1935 Oral nutrition attempted to improve patient's symptoms since she has not eaten since this morning at breakfast.  She took tramadol on an empty stomach at 5  PM  [EW]  2036 Reevaluation after eating.  Patient states "that made me worse."  She now has discomfort in her left mid chest.  Will initiate further evaluation.  [EW]    Clinical Course User Index [EW] Daleen Bo, MD     Patient Vitals for the past 24 hrs:  BP Temp Temp src Pulse Resp SpO2 Height Weight  12/17/17 2100 (!) 180/86 - - 76 14 100 % - -  12/17/17 2000 (!) 176/89 - - 78 18 100 % - -  12/17/17 1939 (!) 195/87 - - 85 16 100 % - -  12/17/17 1739 (!) 191/91 97.6 F (36.4 C) Oral 72 16 95 % - -  12/17/17 1738 - - - - - - 5\' 6"  (1.676 m) 87.5 kg (193 lb)    11:50 PM Reevaluation with update and discussion. After initial assessment and treatment, an updated evaluation reveals no change in clinical status.  Patient remains alert and comfortable without respiratory distress.  Findings discussed with the patient and all questions were answered. Daleen Bo      Final Clinical Impressions(s) / ED Diagnoses   Final diagnoses:  Medication intolerance   Intolerance of tramadol treatment.  Screening evaluation done to evaluate for complications or preceding symptoms causing her discomfort was reassuring.  CBC has minimal elevation of white count otherwise normal.  D-dimer is normal age-related at 0.61.  I-STAT 8 has nonspecific potassium elevation at 5.5, with normal creatinine, likely a hemolyzed sample.  I-STAT troponin and EKG are normal.  Doubt ACS, PE or pneumonia.  Doubt anaphylaxis or significant allergic reaction.  Nursing Notes Reviewed/ Care Coordinated Applicable Imaging Reviewed Interpretation of Laboratory Data incorporated into ED treatment  The patient appears reasonably screened  and/or stabilized for discharge and I doubt any other medical condition or other [facility] requiring further screening, evaluation, or treatment in the ED at this time prior to discharge.  Plan: Home Medications-avoid tramadol, continue other medicines as usual; Home Treatments-gradually advance diet; return here if the recommended treatment, does not improve the symptoms; Recommended follow up-PCP as needed   ED Discharge Orders    None       Daleen Bo, MD 12/18/17 0009

## 2017-12-18 NOTE — ED Notes (Signed)
Pt wheeled to waiting room. Pt verbalized understanding of discharge instructions.   

## 2017-12-18 NOTE — Discharge Instructions (Signed)
Avoid taking tramadol.  For back pain use Tylenol every 4 hours and heat on the sore area.

## 2017-12-23 ENCOUNTER — Other Ambulatory Visit: Payer: Self-pay | Admitting: Family Medicine

## 2018-01-06 ENCOUNTER — Other Ambulatory Visit: Payer: Self-pay | Admitting: Family Medicine

## 2018-01-06 ENCOUNTER — Encounter: Payer: Self-pay | Admitting: Orthopedic Surgery

## 2018-01-14 ENCOUNTER — Encounter: Payer: Self-pay | Admitting: Family Medicine

## 2018-01-14 ENCOUNTER — Ambulatory Visit: Payer: Medicare Other | Admitting: Family Medicine

## 2018-01-14 VITALS — BP 130/80 | HR 78 | Temp 97.8°F | Resp 18 | Ht 66.0 in | Wt 192.0 lb

## 2018-01-14 DIAGNOSIS — J069 Acute upper respiratory infection, unspecified: Secondary | ICD-10-CM

## 2018-01-14 MED ORDER — BENZONATATE 100 MG PO CAPS: 200.0000 mg | ORAL_CAPSULE | Freq: Three times a day (TID) | ORAL | 0 refills | Status: DC | PRN

## 2018-01-14 NOTE — Progress Notes (Signed)
Subjective:    Patient ID: Lindsey Mullins, female    DOB: 24-Oct-1946, 72 y.o.   MRN: [id number]  HPI Patient has a history of hypertension.  She states that the current issue began yesterday morning.  Symptoms include copious clear rhinorrhea, sneezing, nonproductive cough, postnasal drip.  She denies any fever.  She denies any body aches.  She denies any sore throat.  She denies any sinus pain.  She denies any otalgia.  She denies any nausea or vomiting Past Medical History:  Diagnosis Date  . Atypical chest pain 05/23/2012   STRESS TEST - small to moderate sized area of partial reversibility of the anteroapical wall, most likely breast artifact, post-stress EF 67%, EKG show NSR at 65, No Lexiscan EKG changes, non-diagnostic for ischemia; STRESS TEST, 01/25/2010 - normal study, post-stress EF 66%, no significant ischemia  . Cerebral atherosclerosis 09/29/2012   CAROTID DUPLEX - RIGHT  BULB/PROXIMAL ICA-moderate amount of fibrous plaque, 50-69% diameter reduction; LEFT CEA-normal, no significant diameter reduction  . GERD (gastroesophageal reflux disease)   . Hyperlipidemia   . Hypertension   . Restless leg syndrome   . Shortness of breath 03/30/2005   2D ECHO - EF >55%, normal  . TIA (transient ischemic attack) 01/27/2010   2D ECHO - EF 65%, normal   Past Surgical History:  Procedure Laterality Date  . ABDOMINAL EXPLORATION SURGERY    . CAROTID ENDARTERECTOMY    . Peripheral Vascular Catheterization  07/16/2005   Right verterbral-50% smooth segmental badsilar artery stenosis on right side, Right carotid-50% ulcerated proxmial stenosis, Left carotid-60 to 70% left externam carotid artery stenosis, 90% proximal left ICA stenosis   Current Outpatient Medications on File Prior to Visit  Medication Sig Dispense Refill  . aspirin 81 MG tablet Take 81 mg by mouth every evening.     . carvedilol (COREG) 3.125 MG tablet Take 1 tablet (3.125 mg total) by mouth daily. 90 tablet 2  .  cyclobenzaprine (FLEXERIL) 5 MG tablet TAKE 1 TABLET BY MOUTH AT BEDTIME AS NEEDED FOR MUSCLE SPASM OR  LEG  CRAMPS 90 tablet 0  . loratadine (CLARITIN) 10 MG tablet Take 10 mg by mouth daily.    [name] losartan (COZAAR) 50 MG tablet Take 0.5 tablets (25 mg total) by mouth daily. 90 tablet 1  . losartan (COZAAR) 50 MG tablet Take 0.5 tablets (25 mg total) by mouth daily. <PLEASE MAKE APPOINTMENT FOR REFILLS> 30 tablet 0  . magnesium 30 MG tablet Take 250 mg by mouth 2 (two) times daily.    . Multiple Vitamin (MULTIVITAMIN) capsule Take 1 capsule by mouth daily.    . nitroGLYCERIN (NITROSTAT) 0.4 MG SL tablet DISSOLVE ONE TABLET UNDER THE TONGUE AS NEEDED FOR CHEST PAIN**MAX 3 TABLETS EACH 5 MINUTES APART** 25 tablet 3  . Omega-3 Krill Oil 500 MG CAPS Take 1 capsule by mouth daily.    . ranitidine (ZANTAC) 150 MG capsule Take 1 capsule (150 mg total) by mouth 2 (two) times daily. 60 capsule 6  . ranitidine (ZANTAC) 150 MG tablet TAKE 1 TABLET BY MOUTH ONCE DAILY WITH  BREAKFAST 90 tablet 1  . simvastatin (ZOCOR) 40 MG tablet Take 1 tablet (40 mg total) by mouth every evening. 90 tablet 3  . traMADol (ULTRAM) 50 MG tablet Take 1 tablet (50 mg total) by mouth every 8 (eight) hours as needed. 30 tablet 0   No current facility-administered medications on file prior to visit.    Allergies  Allergen Reactions  .  Codeine Nausea And Vomiting  . Lipitor [Atorvastatin] Other (See Comments)    Myalgias  . Lisinopril Cough  . Penicillins Other (See Comments)    Has patient had a PCN reaction causing immediate rash, facial/tongue/throat swelling, SOB or lightheadedness with hypotension: Unknown Has patient had a PCN reaction causing severe rash involving mucus membranes or skin necrosis: Unknown Has patient had a PCN reaction that required hospitalization: Unknown Has patient had a PCN reaction occurring within the last 10 years: Yes States that she was in ICU after being administered   . Pravastatin Other  (See Comments)    Myalgias   Social History   Socioeconomic History  . Marital status: Divorced    Spouse name: Not on file  . Number of children: Not on file  . Years of education: Not on file  . Highest education level: Not on file  Social Needs  . Financial resource strain: Not on file  . Food insecurity - worry: Not on file  . Food insecurity - inability: Not on file  . Transportation needs - medical: Not on file  . Transportation needs - non-medical: Not on file  Occupational History  . Not on file  Tobacco Use  . Smoking status: Former Smoker    Last attempt to quit: 04/21/1983    Years since quitting: 34.7  . Smokeless tobacco: Never Used  Substance and Sexual Activity  . Alcohol use: No  . Drug use: No  . Sexual activity: Not on file  Other Topics Concern  . Not on file  Social History Narrative  . Not on file      Review of Systems  All other systems reviewed and are negative.      Objective:   Physical Exam  Constitutional: She appears well-developed and well-nourished.  HENT:  Right Ear: Tympanic membrane, external ear and ear canal normal.  Left Ear: Tympanic membrane, external ear and ear canal normal.  Nose: Rhinorrhea present. No mucosal edema. Right sinus exhibits no maxillary sinus tenderness and no frontal sinus tenderness. Left sinus exhibits no maxillary sinus tenderness and no frontal sinus tenderness.  Mouth/Throat: Oropharynx is clear and moist. No oropharyngeal exudate.  Cardiovascular: Normal rate, regular rhythm and normal heart sounds.  Pulmonary/Chest: Effort normal and breath sounds normal. No respiratory distress. She has no wheezes. She has no rales.  Vitals reviewed.         Assessment & Plan:  Viral URI  Patient appears to have a viral upper respiratory infection/head cold.  I recommended discontinuation of loratadine and switching temporarily to samples of xyzal milligrams a day which I believe were better as a decongestant  given her history of hypertension.  Also use Afrin 2 sprays each nostril twice daily for 3 days as a decongestant.  Use Tessalon Perles 200 mg every 8 hours as needed for this.  Anticipate gradual self-limited resolution in 3-5 days.  Recheck if symptoms worsen

## 2018-01-18 ENCOUNTER — Emergency Department (HOSPITAL_COMMUNITY): Payer: Medicare Other

## 2018-01-18 ENCOUNTER — Encounter (HOSPITAL_COMMUNITY): Payer: Self-pay

## 2018-01-18 ENCOUNTER — Emergency Department (HOSPITAL_COMMUNITY)
Admission: EM | Admit: 2018-01-18 | Discharge: 2018-01-18 | Disposition: A | Discharge status: Home / Self Care | Payer: Medicare Other | Attending: Emergency Medicine | Admitting: Emergency Medicine

## 2018-01-18 DIAGNOSIS — J069 Acute upper respiratory infection, unspecified: Secondary | ICD-10-CM | POA: Diagnosis not present

## 2018-01-18 DIAGNOSIS — R0602 Shortness of breath: Secondary | ICD-10-CM | POA: Diagnosis present

## 2018-01-18 DIAGNOSIS — Z79899 Other long term (current) drug therapy: Secondary | ICD-10-CM | POA: Diagnosis not present

## 2018-01-18 DIAGNOSIS — I10 Essential (primary) hypertension: Secondary | ICD-10-CM | POA: Diagnosis not present

## 2018-01-18 DIAGNOSIS — J9801 Acute bronchospasm: Secondary | ICD-10-CM | POA: Insufficient documentation

## 2018-01-18 DIAGNOSIS — Z87891 Personal history of nicotine dependence: Secondary | ICD-10-CM | POA: Diagnosis not present

## 2018-01-18 DIAGNOSIS — R1110 Vomiting, unspecified: Secondary | ICD-10-CM | POA: Diagnosis not present

## 2018-01-18 LAB — COMPREHENSIVE METABOLIC PANEL
ALT: 17 U/L (ref 14–54)
AST: 28 U/L (ref 15–41)
Albumin: 3.9 g/dL (ref 3.5–5.0)
Alkaline Phosphatase: 120 U/L (ref 38–126)
Anion gap: 11 (ref 5–15)
BUN: 10 mg/dL (ref 6–20)
CO2: 24 mmol/L (ref 22–32)
Calcium: 9 mg/dL (ref 8.9–10.3)
Chloride: 105 mmol/L (ref 101–111)
Creatinine, Ser: 0.73 mg/dL (ref 0.44–1.00)
GFR calc Af Amer: >60 mL/min (ref >60)
GFR calc non Af Amer: >60 mL/min (ref >60)
Glucose, Bld: 116 mg/dL — ABNORMAL HIGH (ref 65–99)
Potassium: 3.6 mmol/L (ref 3.5–5.1)
Sodium: 140 mmol/L (ref 135–145)
Total Bilirubin: 0.8 mg/dL (ref 0.3–1.2)
Total Protein: 8 g/dL (ref 6.5–8.1)

## 2018-01-18 LAB — CBC
HCT: 38.6 % (ref 36.0–46.0)
Hemoglobin: 12.9 g/dL (ref 12.0–15.0)
MCH: 30.6 pg (ref 26.0–34.0)
MCHC: 33.4 g/dL (ref 30.0–36.0)
MCV: 91.5 fL (ref 78.0–100.0)
Platelets: 341 10*3/uL (ref 150–400)
RBC: 4.22 MIL/uL (ref 3.87–5.11)
RDW: 13.3 % (ref 11.5–15.5)
WBC: 9.6 10*3/uL (ref 4.0–10.5)

## 2018-01-18 LAB — URINALYSIS, ROUTINE W REFLEX MICROSCOPIC
Bacteria, UA: NONE SEEN
Bilirubin Urine: NEGATIVE
Glucose, UA: NEGATIVE mg/dL
Ketones, ur: NEGATIVE mg/dL
Leukocytes, UA: NEGATIVE
Nitrite: NEGATIVE
Protein, ur: NEGATIVE mg/dL
Specific Gravity, Urine: 1.014 (ref 1.005–1.030)
pH: 5 (ref 5.0–8.0)

## 2018-01-18 LAB — LIPASE, BLOOD: Lipase: 34 U/L (ref 11–51)

## 2018-01-18 MED ORDER — FAMOTIDINE IN NACL 20-0.9 MG/50ML-% IV SOLN
20.0000 mg | Freq: Once | INTRAVENOUS | Status: AC
  Administered 2018-01-18: 20 mg via INTRAVENOUS
  Filled 2018-01-18: qty 50

## 2018-01-18 MED ORDER — METHYLPREDNISOLONE SODIUM SUCC 125 MG IJ SOLR
125.0000 mg | Freq: Once | INTRAMUSCULAR | Status: AC
  Administered 2018-01-18: 125 mg via INTRAVENOUS
  Filled 2018-01-18: qty 2

## 2018-01-18 MED ORDER — AEROCHAMBER PLUS FLO-VU MEDIUM MISC
1.0000 | Freq: Once | Status: AC
  Administered 2018-01-18: 1
  Filled 2018-01-18: qty 1

## 2018-01-18 MED ORDER — SODIUM CHLORIDE 0.9 % IV BOLUS (SEPSIS)
500.0000 mL | Freq: Once | INTRAVENOUS | Status: AC
  Administered 2018-01-18: 500 mL via INTRAVENOUS

## 2018-01-18 MED ORDER — IPRATROPIUM-ALBUTEROL 0.5-2.5 (3) MG/3ML IN SOLN
3.0000 mL | Freq: Once | RESPIRATORY_TRACT | Status: AC
  Administered 2018-01-18: 3 mL via RESPIRATORY_TRACT
  Filled 2018-01-18: qty 3

## 2018-01-18 MED ORDER — DEXTROMETHORPHAN-GUAIFENESIN 15-400 MG PO TABS: 1.0000 | ORAL_TABLET | ORAL | 0 refills | Status: DC

## 2018-01-18 MED ORDER — AEROCHAMBER PLUS W/MASK MISC
1.0000 | Freq: Once | Status: DC
  Filled 2018-01-18: qty 1

## 2018-01-18 MED ORDER — IPRATROPIUM BROMIDE 0.02 % IN SOLN
0.5000 mg | Freq: Once | RESPIRATORY_TRACT | Status: DC | PRN
  Filled 2018-01-18: qty 2.5

## 2018-01-18 MED ORDER — PREDNISONE 10 MG (21) PO TBPK: ORAL_TABLET | ORAL | 0 refills | Status: DC

## 2018-01-18 MED ORDER — ALBUTEROL SULFATE HFA 108 (90 BASE) MCG/ACT IN AERS
2.0000 | INHALATION_SPRAY | Freq: Once | RESPIRATORY_TRACT | Status: AC
  Administered 2018-01-18: 2 via RESPIRATORY_TRACT
  Filled 2018-01-18: qty 6.7

## 2018-01-18 MED ORDER — GUAIFENESIN-DM 100-10 MG/5ML PO SYRP
15.0000 mL | ORAL_SOLUTION | ORAL | Status: DC | PRN
  Administered 2018-01-18: 15 mL via ORAL
  Filled 2018-01-18: qty 20

## 2018-01-18 MED ORDER — IPRATROPIUM-ALBUTEROL 0.5-2.5 (3) MG/3ML IN SOLN
3.0000 mL | Freq: Once | RESPIRATORY_TRACT | Status: AC
Start: 2018-01-18 — End: 2018-01-18
  Administered 2018-01-18: 3 mL via RESPIRATORY_TRACT
  Filled 2018-01-18: qty 3

## 2018-01-18 MED ORDER — MAGNESIUM SULFATE 2 GM/50ML IV SOLN
2.0000 g | Freq: Once | INTRAVENOUS | Status: AC
Start: 2018-01-18 — End: 2018-01-18
  Administered 2018-01-18: 2 g via INTRAVENOUS
  Filled 2018-01-18: qty 50

## 2018-01-18 NOTE — ED Triage Notes (Signed)
Pt complains of vomiting and diarrhea since last night Pt also states she has been coughing all week and was seen and received tessalon pearls with no relief

## 2018-01-18 NOTE — Discharge Instructions (Signed)
You appear to have an upper respiratory infection (URI). An upper respiratory tract infection, or cold, is a viral infection of the air passages leading to the lungs. It is contagious and can be spread to others, especially during the first 3 or 4 days. It cannot be cured by antibiotics or other medicines. °RETURN IMMEDIATELY IF you develop shortness of breath, confusion or altered mental status, a new rash, become dizzy, faint, or poorly responsive, or are unable to be cared for at home. ° °

## 2018-01-18 NOTE — ED Provider Notes (Signed)
[facility] DEPT Provider Note   CSN: [id number] Arrival date & time: 01/18/18  0439     History   Chief Complaint Chief Complaint  Patient presents with  . Emesis  . Diarrhea    HPI Lindsey Mullins is a very pleasant 72 y.o. female who presents the emergency department with chief complaint of shortness of breath.  The patient states that she developed an upper respiratory infection about 1 week ago.  She has been taking Tessalon Perles with minimal relief of her symptoms.  She has paroxysms of severe coughing, chest tightness, posttussive vomiting of nonbloody nonbilious vomitus.  Last night she developed several episodes of loose and runny stools not associated with coughing.  She has not been taking any antibiotics.  She denies any abdominal pain except for abdominal wall muscle pain with coughing.  She does have a history of reflux and takes medications that she feels control it well.  She denies chest pain.  She does have a dry cough.  She denies fevers, urinary symptoms.  She denies a history of asthma.  She does have a distant history of smoking and quit in the 1980s.  HPI  Past Medical History:  Diagnosis Date  . Atypical chest pain 05/23/2012   STRESS TEST - small to moderate sized area of partial reversibility of the anteroapical wall, most likely breast artifact, post-stress EF 67%, EKG show NSR at 65, No Lexiscan EKG changes, non-diagnostic for ischemia; STRESS TEST, 01/25/2010 - normal study, post-stress EF 66%, no significant ischemia  . Cerebral atherosclerosis 09/29/2012   CAROTID DUPLEX - RIGHT  BULB/PROXIMAL ICA-moderate amount of fibrous plaque, 50-69% diameter reduction; LEFT CEA-normal, no significant diameter reduction  . GERD (gastroesophageal reflux disease)   . Hyperlipidemia   . Hypertension   . Restless leg syndrome   . Shortness of breath 03/30/2005   2D ECHO - EF >55%, normal  . TIA (transient ischemic attack) 01/27/2010   2D  ECHO - EF 65%, normal    Patient Active Problem List   Diagnosis Date Noted  . DDD (degenerative disc disease), lumbar 05/11/2016  . Obesity 05/11/2016  . Glucose intolerance (impaired glucose tolerance) 01/12/2016  . Pain in the chest   . Chest pain 01/10/2016  . GERD (gastroesophageal reflux disease) 01/26/2015  . Stress and adjustment reaction 01/26/2015  . Dysuria 11/30/2013  . Carotid artery disease ([place]) 04/20/2013  . Bunion, left 09/21/2012  . Overweight(278.02) 06/04/2012  . Back pain 04/18/2012  . Essential hypertension, benign 04/02/2012  . Hyperlipidemia 04/02/2012  . Dysphagia 04/02/2012  . RLS (restless legs syndrome) 04/02/2012    Past Surgical History:  Procedure Laterality Date  . ABDOMINAL EXPLORATION SURGERY    . CAROTID ENDARTERECTOMY    . Peripheral Vascular Catheterization  07/16/2005   Right verterbral-50% smooth segmental badsilar artery stenosis on right side, Right carotid-50% ulcerated proxmial stenosis, Left carotid-60 to 70% left externam carotid artery stenosis, 90% proximal left ICA stenosis    OB History    Gravida Para Term Preterm AB Living   2 2 2          SAB TAB Ectopic Multiple Live Births                   Home Medications    Prior to Admission medications   Medication Sig Start Date End Date Taking? Authorizing Provider  aspirin 81 MG tablet Take 81 mg by mouth every evening.    Yes [provider]  carvedilol (  COREG) 3.125 MG tablet Take 1 tablet (3.125 mg total) by mouth daily. 07/31/17  Yes [place], Modena Nunnery, MD  cyclobenzaprine (FLEXERIL) 5 MG tablet TAKE 1 TABLET BY MOUTH AT BEDTIME AS NEEDED FOR MUSCLE SPASM OR  LEG  CRAMPS 12/10/17  Yes [place], Modena Nunnery, MD  loratadine (CLARITIN) 10 MG tablet Take 10 mg by mouth daily.   Yes [provider]  losartan (COZAAR) 50 MG tablet Take 0.5 tablets (25 mg total) by mouth daily. <PLEASE MAKE APPOINTMENT FOR REFILLS> 12/02/17  Yes Lorretta Harp, MD  Magnesium 250 MG  TABS Take 250 mg by mouth 2 (two) times daily.   Yes [provider]  Multiple Vitamin (MULTIVITAMIN) capsule Take 1 capsule by mouth daily.   Yes [provider]  nitroGLYCERIN (NITROSTAT) 0.4 MG SL tablet DISSOLVE ONE TABLET UNDER THE TONGUE AS NEEDED FOR CHEST PAIN**MAX 3 TABLETS EACH 5 MINUTES APART** 09/14/16  Yes [place], Modena Nunnery, MD  Omega-3 Krill Oil 500 MG CAPS Take 1 capsule by mouth daily.   Yes [provider]  ranitidine (ZANTAC) 150 MG capsule Take 1 capsule (150 mg total) by mouth 2 (two) times daily. 07/31/17  Yes [place], Modena Nunnery, MD  simvastatin (ZOCOR) 40 MG tablet Take 1 tablet (40 mg total) by mouth every evening. 07/31/17  Yes , Modena Nunnery, MD  Dextromethorphan-guaiFENesin 15-400 MG TABS Take 1 tablet by mouth every 4 (four) hours. 01/18/18   Valgene Deloatch, PA-C  losartan (COZAAR) 50 MG tablet Take 0.5 tablets (25 mg total) by mouth daily. Patient not taking: Reported on 01/18/2018 07/31/17   [name], MD  predniSONE (STERAPRED UNI-PAK 21 TAB) 10 MG (21) TBPK tablet Use as directed 01/18/18   Margarita Mail, PA-C  ranitidine (ZANTAC) 150 MG tablet TAKE 1 TABLET BY MOUTH ONCE DAILY WITH  BREAKFAST Patient not taking: Reported on 01/18/2018 12/24/17   [name], MD  traMADol (ULTRAM) 50 MG tablet Take 1 tablet (50 mg total) by mouth every 8 (eight) hours as needed. Patient not taking: Reported on 01/18/2018 12/16/17   [name], MD    Family History Family History  Problem Relation Age of Onset  . Heart disease Mother   . Hypertension Mother   . Hypertension Father   . Heart disease Father     Social History Social History   Tobacco Use  . Smoking status: Former Smoker    Last attempt to quit: 04/21/1983    Years since quitting: 34.7  . Smokeless tobacco: Never Used  Substance Use Topics  . Alcohol use: No  . Drug use: No     Allergies   Codeine; Lipitor [atorvastatin]; Lisinopril; Penicillins; and  Pravastatin   Review of Systems Review of Systems  Ten systems reviewed and are negative for acute change, except as noted in the HPI.   Physical Exam Updated Vital Signs BP (!) 150/87   Pulse 86   Temp 98.8 F (37.1 C) (Oral)   Resp 16   Ht 5\' 6"  (1.676 m)   Wt 86.2 kg (190 lb)   SpO2 96%   BMI 30.67 kg/m   Physical Exam  Constitutional: She is oriented to person, place, and time. She appears well-developed and well-nourished. No distress.  HENT:  Head: Normocephalic and atraumatic.  Eyes: Conjunctivae are normal. No scleral icterus.  Neck: Normal range of motion.  Cardiovascular: Normal rate, regular rhythm and normal heart sounds. Exam reveals no gallop and no friction rub.  No murmur heard.  Pulmonary/Chest: No respiratory distress. She has wheezes.  Increased respiratory effort Diffuse wheezing, insp/ exp  Abdominal: Soft. Bowel sounds are normal. She exhibits no distension and no mass. There is no tenderness. There is no guarding.  Neurological: She is alert and oriented to person, place, and time.  Skin: Skin is warm and dry. She is not diaphoretic.  Psychiatric: Her behavior is normal.  Nursing note and vitals reviewed.    ED Treatments / Results  Labs (all labs ordered are listed, but only abnormal results are displayed) Labs Reviewed  COMPREHENSIVE METABOLIC PANEL - Abnormal; Notable for the following components:      Result Value   Glucose, Bld 116 (*)    All other components within normal limits  URINALYSIS, ROUTINE W REFLEX MICROSCOPIC - Abnormal; Notable for the following components:   Hgb urine dipstick SMALL (*)    Squamous Epithelial / LPF 0-5 (*)    All other components within normal limits  LIPASE, BLOOD  CBC    EKG  EKG Interpretation None       Radiology Dg Chest 2 View  Result Date: 01/18/2018 CLINICAL DATA:  Cough and congestion EXAM: CHEST  2 VIEW COMPARISON:  December 17, 2017 FINDINGS: The heart size and mediastinal contours are  within normal limits. Both lungs are clear. The visualized skeletal structures are unremarkable. IMPRESSION: No active cardiopulmonary disease. Electronically Signed   By: Dorise Bullion III M.D   On: 01/18/2018 07:31    Procedures Procedures (including critical care time)  Medications Ordered in ED Medications  ipratropium-albuterol (DUONEB) 0.5-2.5 (3) MG/3ML nebulizer solution 3 mL (3 mLs Nebulization Given 01/18/18 0633)  sodium chloride 0.9 % bolus 500 mL (0 mLs Intravenous Stopped 01/18/18 0907)  famotidine (PEPCID) IVPB 20 mg premix (0 mg Intravenous Stopped 01/18/18 0723)  methylPREDNISolone sodium succinate (SOLU-MEDROL) 125 mg/2 mL injection 125 mg (125 mg Intravenous Given 01/18/18 0640)  magnesium sulfate IVPB 2 g 50 mL (0 g Intravenous Stopped 01/18/18 0906)  ipratropium-albuterol (DUONEB) 0.5-2.5 (3) MG/3ML nebulizer solution 3 mL (3 mLs Nebulization Given 01/18/18 0926)  albuterol (PROVENTIL HFA;VENTOLIN HFA) 108 (90 Base) MCG/ACT inhaler 2 puff (2 puffs Inhalation Given 01/18/18 1049)  AEROCHAMBER PLUS FLO-VU MEDIUM MISC 1 each (1 each Other Given 01/18/18 1049)     Initial Impression / Assessment and Plan / ED Course  I have reviewed the triage vital signs and the nursing notes.  Pertinent labs & imaging results that were available during my care of the patient were reviewed by me and considered in my medical decision making (see chart for details).  Clinical Course as of Jan 19 1600  Sat Jan 18, 2018  0918 Breathing status improved after DuoNeb however she continues to have some wheezing.  I can hear rhonchi it now that clear with cough.  Will repeat a DuoNeb treatment at this time I suspect discharge after treatment.  [AH]    Clinical Course User Index [AH] Margarita Mail, PA-C    Improved after second treatment.  Will discharge with the medications noted below.  She appears appropriate for discharge and is greatly improved after treatment in the emergency department.  Chest  x-ray reviewed shows no signs of pneumonia.  She has not had any active vomiting or posttussive vomiting here in the emergency department, benign abdominal exam.  Cast return precautions.  Final Clinical Impressions(s) / ED Diagnoses   Final diagnoses:  Upper respiratory tract infection, unspecified type  Cough due to bronchospasm    ED Discharge  Orders        Ordered    predniSONE (STERAPRED UNI-PAK 21 TAB) 10 MG (21) TBPK tablet     01/18/18 1009    Dextromethorphan-guaiFENesin 15-400 MG TABS  Every 4 hours     01/18/18 1013       Margarita Mail, PA-C 01/18/18 1601    Virgel Manifold, MD 01/19/18 [phone]

## 2018-01-20 ENCOUNTER — Other Ambulatory Visit: Payer: Self-pay

## 2018-01-20 ENCOUNTER — Encounter: Payer: Self-pay | Admitting: Family Medicine

## 2018-01-20 ENCOUNTER — Ambulatory Visit: Payer: Medicare Other | Admitting: Family Medicine

## 2018-01-20 VITALS — BP 128/78 | HR 90 | Temp 98.2°F | Resp 14 | Ht 66.0 in | Wt 193.0 lb

## 2018-01-20 DIAGNOSIS — A084 Viral intestinal infection, unspecified: Secondary | ICD-10-CM | POA: Diagnosis not present

## 2018-01-20 DIAGNOSIS — J069 Acute upper respiratory infection, unspecified: Secondary | ICD-10-CM | POA: Diagnosis not present

## 2018-01-20 NOTE — Patient Instructions (Addendum)
STOP magnesium, krill oil, nasal spray only for blocked nose Use cough medicine or robitussin in the evening Use the inhaler as needed during the day and before bedtime  Take the steroids  Continue the heart pills  Lots of fluid  Put her mother - Mrs MIMS on for tomorrow at 11am

## 2018-01-20 NOTE — Progress Notes (Signed)
   Subjective:    Patient ID: Lindsey Mullins, female    DOB: May 18, 1946, 72 y.o.   MRN: [id number]  Patient presents for ER F/U (URI) Patient here for ER follow-up.  She was seen in the ER over the weekend patient states that she went because of multiple episodes of diarrhea as well as vomiting that occurred the night before.  However she also had some cough with congestion she had been taking Tessalon Perles. Labs were reviewed they were unremarkable. Chest x-ray was also normal. Diagnosed with upper respiratory infection charge home with prednisone as well as cough medicine. She been seen on 2/26 in our office and diagnosed with viral illness which is when the Tessalon was prescribed as well as given antihistamine and advised to use over-the-counter nasal decongestion No known sick contacts  She started prednisone yesterday, also has albuterol inhaler with spacer   No further vomiting, but still has some loose stool, now it is more when she has to urinate  She is still taking magnesium  Sometimes her pills and food gets stuck but she does not want to have procedure done, has seen GI  This morning had oatmeal and water, yesterday had fried squash/okra  Review Of Systems:  GEN- denies fatigue, fever, weight loss,weakness, recent illness HEENT- denies eye drainage, change in vision, nasal discharge, CVS- denies chest pain, palpitations RESP- denies SOB, +cough, wheeze ABD- denies N/V, +change in stools, abd pain GU- denies dysuria, hematuria, dribbling, incontinence MSK- denies joint pain, muscle aches, injury Neuro- denies headache, dizziness, syncope, seizure activity       Objective:    BP 128/78   Pulse 90   Temp 98.2 F (36.8 C) (Oral)   Resp 14   Ht 5\' 6"  (1.676 m)   Wt 193 lb (87.5 kg)   SpO2 97%   BMI 31.15 kg/m  GEN- NAD, alert and oriented x3 HEENT- PERRL, EOMI, non injected sclera, pink conjunctiva, MMM, oropharynx clear Neck- Supple, no LAD CVS- RRR, no  murmur RESP-CTAB, no rales, no wheeze, harsh cough  ABD-NABS,soft,NT,ND EXT- No edema Pulses- Radial  2+        Assessment & Plan:      Problem List Items Addressed This Visit    None    Visit Diagnoses    Viral URI    -  Primary   Viral enteritis and URI. compelte steroids, no fever, tolerating fluids and diarrhea improved. Stop magnesium until bowels back to baseline. Continue cough med/inhaler as needed  Keep hydrated    Viral gastroenteritis          Note: This dictation was prepared with Dragon dictation along with smaller phrase technology. Any transcriptional errors that result from this process are unintentional.

## 2018-01-27 ENCOUNTER — Encounter: Payer: Self-pay | Admitting: Family Medicine

## 2018-02-03 ENCOUNTER — Other Ambulatory Visit: Payer: Self-pay | Admitting: Cardiovascular Disease

## 2018-02-07 ENCOUNTER — Other Ambulatory Visit: Payer: Self-pay | Admitting: Family Medicine

## 2018-03-06 ENCOUNTER — Ambulatory Visit (HOSPITAL_COMMUNITY)
Admission: RE | Admit: 2018-03-06 | Discharge: 2018-03-06 | Disposition: A | Discharge status: Home / Self Care | Payer: Medicare Other | Source: Ambulatory Visit | Attending: Cardiovascular Disease | Admitting: Cardiovascular Disease

## 2018-03-06 DIAGNOSIS — I739 Peripheral vascular disease, unspecified: Secondary | ICD-10-CM

## 2018-03-06 DIAGNOSIS — I779 Disorder of arteries and arterioles, unspecified: Secondary | ICD-10-CM

## 2018-03-06 DIAGNOSIS — I6523 Occlusion and stenosis of bilateral carotid arteries: Secondary | ICD-10-CM | POA: Insufficient documentation

## 2018-03-13 ENCOUNTER — Other Ambulatory Visit: Payer: Self-pay | Admitting: Cardiovascular Disease

## 2018-03-13 DIAGNOSIS — I6523 Occlusion and stenosis of bilateral carotid arteries: Secondary | ICD-10-CM

## 2018-04-07 ENCOUNTER — Other Ambulatory Visit: Payer: Self-pay | Admitting: Cardiovascular Disease

## 2018-04-07 NOTE — Telephone Encounter (Signed)
Rx has been sent to the pharmacy electronically. ° °

## 2018-06-09 ENCOUNTER — Other Ambulatory Visit: Payer: Self-pay | Admitting: Cardiovascular Disease

## 2018-06-09 ENCOUNTER — Other Ambulatory Visit: Payer: Self-pay | Admitting: Family Medicine

## 2018-06-10 NOTE — Telephone Encounter (Signed)
Ok to refill 

## 2018-07-09 ENCOUNTER — Telehealth: Payer: Self-pay | Admitting: Family Medicine

## 2018-07-09 ENCOUNTER — Other Ambulatory Visit: Payer: Self-pay | Admitting: Family Medicine

## 2018-07-09 NOTE — Telephone Encounter (Signed)
Awaiting forms

## 2018-07-09 NOTE — Telephone Encounter (Signed)
Pt dropped off forms to be completed about driving. Placed into yellow folder.

## 2018-07-10 NOTE — Telephone Encounter (Signed)
Received DMV forms.   Patient will need OV to complete.   Call placed to patient. [place].

## 2018-07-14 NOTE — Telephone Encounter (Signed)
OV scheduled on 07/16/2018.

## 2018-07-16 ENCOUNTER — Ambulatory Visit: Payer: Medicare Other | Admitting: Family Medicine

## 2018-07-24 ENCOUNTER — Ambulatory Visit: Payer: Medicare Other | Admitting: Family Medicine

## 2018-07-24 ENCOUNTER — Encounter: Payer: Self-pay | Admitting: Family Medicine

## 2018-07-24 VITALS — BP 142/76 | HR 88 | Temp 97.9°F | Resp 14 | Ht 66.5 in | Wt 193.0 lb

## 2018-07-24 DIAGNOSIS — I10 Essential (primary) hypertension: Secondary | ICD-10-CM

## 2018-07-24 DIAGNOSIS — E785 Hyperlipidemia, unspecified: Secondary | ICD-10-CM

## 2018-07-24 DIAGNOSIS — Z23 Encounter for immunization: Secondary | ICD-10-CM

## 2018-07-24 DIAGNOSIS — Z1239 Encounter for other screening for malignant neoplasm of breast: Secondary | ICD-10-CM

## 2018-07-24 DIAGNOSIS — Z1231 Encounter for screening mammogram for malignant neoplasm of breast: Secondary | ICD-10-CM

## 2018-07-24 DIAGNOSIS — Z780 Asymptomatic menopausal state: Secondary | ICD-10-CM

## 2018-07-24 DIAGNOSIS — I6523 Occlusion and stenosis of bilateral carotid arteries: Secondary | ICD-10-CM

## 2018-07-24 MED ORDER — RANITIDINE HCL 150 MG PO CAPS: 150.0000 mg | ORAL_CAPSULE | Freq: Every day | ORAL | 3 refills | Status: DC

## 2018-07-24 NOTE — Progress Notes (Signed)
Subjective:    Patient ID: Lindsey Mullins, female    DOB: [birth year], 72 y.o.   MRN: [id number]  HPI Patient has a history of a TIA in 2011.  She also has a history of bilateral carotid artery stenosis.  She previously had a left carotid endarterectomy.  Patient had carotid artery Dopplers performed in April of this year which revealed a 40 to 59% stenosis in the right carotid artery.  She had a 1 to 39% stenosis in the left internal carotid artery.  This is monitored annually by Dr. Gwenlyn Found.  She is overdue to check her fasting lipid panel.  Her LDL cholesterol goal will be less than 70 given her known ASCVD.  She denies any stroke symptoms.  She denies any myalgias or right upper quadrant pain.  Is consistent taking her simvastatin.  She denies any chest pain shortness of breath or dyspnea on exertion.  She is also overdue for a mammogram as well as a screening bone density test. Past Medical History:  Diagnosis Date  . Atypical chest pain 05/23/2012   STRESS TEST - small to moderate sized area of partial reversibility of the anteroapical wall, most likely breast artifact, post-stress EF 67%, EKG show NSR at 65, No Lexiscan EKG changes, non-diagnostic for ischemia; STRESS TEST, 01/25/2010 - normal study, post-stress EF 66%, no significant ischemia  . Cerebral atherosclerosis 09/29/2012   CAROTID DUPLEX - RIGHT  BULB/PROXIMAL ICA-moderate amount of fibrous plaque, 50-69% diameter reduction; LEFT CEA-normal, no significant diameter reduction  . GERD (gastroesophageal reflux disease)   . Hyperlipidemia   . Hypertension   . Restless leg syndrome   . Shortness of breath 03/30/2005   2D ECHO - EF >55%, normal  . TIA (transient ischemic attack) 01/27/2010   2D ECHO - EF 65%, normal   Past Surgical History:  Procedure Laterality Date  . ABDOMINAL EXPLORATION SURGERY    . CAROTID ENDARTERECTOMY    . Peripheral Vascular Catheterization  07/16/2005   Right verterbral-50% smooth segmental badsilar artery  stenosis on right side, Right carotid-50% ulcerated proxmial stenosis, Left carotid-60 to 70% left externam carotid artery stenosis, 90% proximal left ICA stenosis   Current Outpatient Medications on File Prior to Visit  Medication Sig Dispense Refill  . albuterol (PROVENTIL HFA;VENTOLIN HFA) 108 (90 Base) MCG/ACT inhaler Inhale into the lungs every 6 (six) hours as needed for wheezing or shortness of breath.    [name] aspirin 81 MG tablet Take 81 mg by mouth every evening.     . carvedilol (COREG) 3.125 MG tablet Take 1 tablet (3.125 mg total) by mouth daily. 90 tablet 2  . cyclobenzaprine (FLEXERIL) 5 MG tablet TAKE 1 TABLET BY MOUTH AT BEDTIME AS NEEDED FOR MUSCLE SPASM OR  LEG  CRAMPS 90 tablet 0  . loratadine (CLARITIN) 10 MG tablet Take 10 mg by mouth daily.    [name] losartan (COZAAR) 50 MG tablet TAKE 1/2 (ONE-HALF) TABLET BY MOUTH ONCE DAILY 30 tablet 8  . Magnesium 250 MG TABS Take 250 mg by mouth 2 (two) times daily.    . Multiple Vitamin (MULTIVITAMIN) capsule Take 1 capsule by mouth daily.    . nitroGLYCERIN (NITROSTAT) 0.4 MG SL tablet DISSOLVE ONE TABLET UNDER THE TONGUE AS NEEDED FOR CHEST PAIN**MAX 3 TABLETS EACH 5 MINUTES APART** 25 tablet 3  . Omega-3 Krill Oil 500 MG CAPS Take 1 capsule by mouth daily.    . simvastatin (ZOCOR) 40 MG tablet Take 1 tablet (40 mg total) by mouth  every evening. 90 tablet 3  . traMADol (ULTRAM) 50 MG tablet Take 1 tablet (50 mg total) by mouth every 8 (eight) hours as needed. 30 tablet 0   No current facility-administered medications on file prior to visit.    Allergies  Allergen Reactions  . Codeine Nausea And Vomiting  . Lipitor [Atorvastatin] Other (See Comments)    Myalgias  . Lisinopril Cough  . Penicillins Other (See Comments)    Has patient had a PCN reaction causing immediate rash, facial/tongue/throat swelling, SOB or lightheadedness with hypotension: Unknown Has patient had a PCN reaction causing severe rash involving mucus membranes or skin  necrosis: Unknown Has patient had a PCN reaction that required hospitalization: Unknown Has patient had a PCN reaction occurring within the last 10 years: Yes States that she was in ICU after being administered   . Pravastatin Other (See Comments)    Myalgias   Social History   Socioeconomic History  . Marital status: Divorced    Spouse name: Not on file  . Number of children: Not on file  . Years of education: Not on file  . Highest education level: Not on file  Occupational History  . Not on file  Social Needs  . Financial resource strain: Not on file  . Food insecurity:    Worry: Not on file    Inability: Not on file  . Transportation needs:    Medical: Not on file    Non-medical: Not on file  Tobacco Use  . Smoking status: Former Smoker    Last attempt to quit: 04/21/1983    Years since quitting: 35.2  . Smokeless tobacco: Never Used  Substance and Sexual Activity  . Alcohol use: No  . Drug use: No  . Sexual activity: Not on file  Lifestyle  . Physical activity:    Days per week: Not on file    Minutes per session: Not on file  . Stress: Not on file  Relationships  . Social connections:    Talks on phone: Not on file    Gets together: Not on file    Attends religious service: Not on file    Active member of club or organization: Not on file    Attends meetings of clubs or organizations: Not on file    Relationship status: Not on file  . Intimate partner violence:    Fear of current or ex partner: Not on file    Emotionally abused: Not on file    Physically abused: Not on file    Forced sexual activity: Not on file  Other Topics Concern  . Not on file  Social History Narrative  . Not on file      Review of Systems  All other systems reviewed and are negative.      Objective:   Physical Exam  Constitutional: She appears well-developed and well-nourished.  Cardiovascular: Normal rate, regular rhythm, normal heart sounds and intact distal pulses.  Exam reveals no gallop and no friction rub.  No murmur heard. Pulmonary/Chest: Effort normal and breath sounds normal. No stridor. No respiratory distress. She has no wheezes. She has no rales.  Abdominal: Soft. Bowel sounds are normal.  Musculoskeletal: She exhibits no edema.  Vitals reviewed.         Assessment & Plan:  Hyperlipidemia, unspecified hyperlipidemia type - Plan: CBC with Differential/Platelet, COMPLETE METABOLIC PANEL WITH GFR, Lipid panel  Essential hypertension, benign - Plan: CBC with Differential/Platelet, COMPLETE METABOLIC PANEL WITH GFR, Lipid panel  Bilateral carotid artery stenosis - Plan: CBC with Differential/Platelet, COMPLETE METABOLIC PANEL WITH GFR, Lipid panel  Breast cancer screening - Plan: MM Digital Screening  Postmenopausal estrogen deficiency - Plan: DG Bone Density  Patient states she is monitoring her blood pressure at home and is never this high.  She is convinced that her blood pressures at home are less than 140/90.  I will check a fasting lipid panel.  Her goal LDL cholesterol is less than 70.  I will also monitor her fasting blood sugar as well as her liver and kidney test.  Patient is overdue for mammogram and a bone density test and I will schedule this for her.  She is compliant taking her aspirin.  Recommended her annual flu shot.

## 2018-07-24 NOTE — Addendum Note (Signed)
Addended by: Shary Decamp B on: 07/24/2018 12:22 PM   Modules accepted: Orders

## 2018-07-24 NOTE — Telephone Encounter (Signed)
Forms completed at [facility] and faxed to [employer] at [phone]

## 2018-07-25 ENCOUNTER — Other Ambulatory Visit: Payer: Medicare Other

## 2018-07-25 DIAGNOSIS — E785 Hyperlipidemia, unspecified: Secondary | ICD-10-CM

## 2018-07-25 DIAGNOSIS — I10 Essential (primary) hypertension: Secondary | ICD-10-CM

## 2018-07-25 DIAGNOSIS — I6523 Occlusion and stenosis of bilateral carotid arteries: Secondary | ICD-10-CM

## 2018-07-26 LAB — CBC WITH DIFFERENTIAL/PLATELET
BASOS ABS: 57 {cells}/uL (ref 0–200)
Basophils Relative: 0.7 %
EOS ABS: 462 {cells}/uL (ref 15–500)
Eosinophils Relative: 5.7 %
HCT: 34.6 % — ABNORMAL LOW (ref 35.0–45.0)
Hemoglobin: 11.5 g/dL — ABNORMAL LOW (ref 11.7–15.5)
Lymphs Abs: 2665 cells/uL (ref 850–3900)
MCH: 29.9 pg (ref 27.0–33.0)
MCHC: 33.2 g/dL (ref 32.0–36.0)
MCV: 89.9 fL (ref 80.0–100.0)
MONOS PCT: 10.6 %
MPV: 11.2 fL (ref 7.5–12.5)
Neutro Abs: 4058 cells/uL (ref 1500–7800)
Neutrophils Relative %: 50.1 %
PLATELETS: 224 10*3/uL (ref 140–400)
RBC: 3.85 10*6/uL (ref 3.80–5.10)
RDW: 13.6 % (ref 11.0–15.0)
TOTAL LYMPHOCYTE: 32.9 %
WBC mixed population: 859 cells/uL (ref 200–950)
WBC: 8.1 10*3/uL (ref 3.8–10.8)

## 2018-07-26 LAB — COMPREHENSIVE METABOLIC PANEL
AG Ratio: 1.5 (calc) (ref 1.0–2.5)
ALBUMIN MSPROF: 4 g/dL (ref 3.6–5.1)
ALT: 12 U/L (ref 6–29)
AST: 25 U/L (ref 10–35)
Alkaline phosphatase (APISO): 88 U/L (ref 33–130)
BUN: 11 mg/dL (ref 7–25)
CO2: 22 mmol/L (ref 20–32)
Calcium: 9.2 mg/dL (ref 8.6–10.4)
Chloride: 105 mmol/L (ref 98–110)
Creat: 0.81 mg/dL (ref 0.60–0.93)
GLUCOSE: 85 mg/dL (ref 65–99)
Globulin: 2.7 g/dL (calc) (ref 1.9–3.7)
POTASSIUM: 3.9 mmol/L (ref 3.5–5.3)
SODIUM: 139 mmol/L (ref 135–146)
Total Bilirubin: 0.7 mg/dL (ref 0.2–1.2)
Total Protein: 6.7 g/dL (ref 6.1–8.1)

## 2018-07-26 LAB — LIPID PANEL
Cholesterol: 178 mg/dL (ref <200)
HDL: 46 mg/dL — ABNORMAL LOW (ref >50)
LDL Cholesterol (Calc): 109 mg/dL (calc) — ABNORMAL HIGH
Non-HDL Cholesterol (Calc): 132 mg/dL (calc) — ABNORMAL HIGH (ref <130)
Total CHOL/HDL Ratio: 3.9 (calc) (ref <5.0)
Triglycerides: 121 mg/dL (ref <150)

## 2018-07-30 ENCOUNTER — Other Ambulatory Visit: Payer: Self-pay | Admitting: Family Medicine

## 2018-07-30 MED ORDER — ROSUVASTATIN CALCIUM 20 MG PO TABS: 20.0000 mg | ORAL_TABLET | Freq: Every day | ORAL | 3 refills | Status: DC

## 2018-08-04 ENCOUNTER — Other Ambulatory Visit: Payer: Self-pay | Admitting: Family Medicine

## 2018-08-04 DIAGNOSIS — E2839 Other primary ovarian failure: Secondary | ICD-10-CM

## 2018-08-18 ENCOUNTER — Ambulatory Visit (HOSPITAL_COMMUNITY): Payer: Medicare Other

## 2018-08-18 ENCOUNTER — Other Ambulatory Visit (HOSPITAL_COMMUNITY): Payer: Medicare Other

## 2018-08-23 ENCOUNTER — Other Ambulatory Visit: Payer: Self-pay | Admitting: Family Medicine

## 2018-09-17 ENCOUNTER — Other Ambulatory Visit: Payer: Self-pay | Admitting: Family Medicine

## 2018-09-17 ENCOUNTER — Other Ambulatory Visit: Payer: Self-pay

## 2018-09-17 NOTE — Patient Outreach (Signed)
[facility]) Care Management  09/17/2018  NABRIA NEVIN 27-Mar-1946 [id number]   Medication Adherence call to Mrs. Sarajane Marek spoke with patient she is due on Losartan 50 mg and Simvastatin 40 mg patient was very hesitance on given her information and prefer to order her self from [facility]. Mrs. Delpriore is showing past due under [place].   [place] Management Direct Dial [phone]  Fax [phone] [email or web address]

## 2018-09-30 ENCOUNTER — Ambulatory Visit
Admission: RE | Admit: 2018-09-30 | Discharge: 2018-09-30 | Disposition: A | Discharge status: Home / Self Care | Payer: Medicare Other | Source: Ambulatory Visit | Attending: Family Medicine | Admitting: Family Medicine

## 2018-09-30 ENCOUNTER — Encounter: Payer: Self-pay | Admitting: Family Medicine

## 2018-09-30 ENCOUNTER — Encounter: Payer: Self-pay | Admitting: *Deleted

## 2018-09-30 DIAGNOSIS — M8580 Other specified disorders of bone density and structure, unspecified site: Secondary | ICD-10-CM | POA: Insufficient documentation

## 2018-09-30 DIAGNOSIS — E2839 Other primary ovarian failure: Secondary | ICD-10-CM

## 2018-09-30 DIAGNOSIS — Z1239 Encounter for other screening for malignant neoplasm of breast: Secondary | ICD-10-CM

## 2018-09-30 HISTORY — DX: Other specified disorders of bone density and structure, unspecified site: M85.80

## 2018-10-02 ENCOUNTER — Other Ambulatory Visit: Payer: Self-pay | Admitting: Family Medicine

## 2018-10-02 DIAGNOSIS — R928 Other abnormal and inconclusive findings on diagnostic imaging of breast: Secondary | ICD-10-CM

## 2018-10-09 ENCOUNTER — Other Ambulatory Visit: Payer: Medicare Other

## 2018-10-22 ENCOUNTER — Other Ambulatory Visit: Payer: Medicare Other

## 2018-10-24 ENCOUNTER — Other Ambulatory Visit: Payer: Medicare Other

## 2018-10-29 ENCOUNTER — Ambulatory Visit
Admission: RE | Admit: 2018-10-29 | Discharge: 2018-10-29 | Disposition: A | Discharge status: Home / Self Care | Payer: Medicare Other | Source: Ambulatory Visit | Attending: Family Medicine | Admitting: Family Medicine

## 2018-10-29 ENCOUNTER — Other Ambulatory Visit: Payer: Self-pay | Admitting: Family Medicine

## 2018-10-29 DIAGNOSIS — N6489 Other specified disorders of breast: Secondary | ICD-10-CM

## 2018-10-29 DIAGNOSIS — R928 Other abnormal and inconclusive findings on diagnostic imaging of breast: Secondary | ICD-10-CM

## 2018-10-29 DIAGNOSIS — R921 Mammographic calcification found on diagnostic imaging of breast: Secondary | ICD-10-CM

## 2018-12-09 ENCOUNTER — Encounter (HOSPITAL_COMMUNITY): Payer: Self-pay | Admitting: Emergency Medicine

## 2018-12-09 ENCOUNTER — Emergency Department (HOSPITAL_COMMUNITY): Payer: Medicare Other

## 2018-12-09 ENCOUNTER — Other Ambulatory Visit: Payer: Self-pay

## 2018-12-09 ENCOUNTER — Emergency Department (HOSPITAL_COMMUNITY)
Admission: EM | Admit: 2018-12-09 | Discharge: 2018-12-09 | Disposition: A | Discharge status: Home / Self Care | Payer: Medicare Other | Attending: Emergency Medicine | Admitting: Emergency Medicine

## 2018-12-09 DIAGNOSIS — E785 Hyperlipidemia, unspecified: Secondary | ICD-10-CM | POA: Insufficient documentation

## 2018-12-09 DIAGNOSIS — Z87891 Personal history of nicotine dependence: Secondary | ICD-10-CM | POA: Diagnosis not present

## 2018-12-09 DIAGNOSIS — I10 Essential (primary) hypertension: Secondary | ICD-10-CM | POA: Insufficient documentation

## 2018-12-09 DIAGNOSIS — R079 Chest pain, unspecified: Secondary | ICD-10-CM | POA: Diagnosis present

## 2018-12-09 DIAGNOSIS — I2510 Atherosclerotic heart disease of native coronary artery without angina pectoris: Secondary | ICD-10-CM | POA: Insufficient documentation

## 2018-12-09 DIAGNOSIS — Z79899 Other long term (current) drug therapy: Secondary | ICD-10-CM | POA: Diagnosis not present

## 2018-12-09 DIAGNOSIS — K219 Gastro-esophageal reflux disease without esophagitis: Secondary | ICD-10-CM | POA: Diagnosis not present

## 2018-12-09 LAB — CBC WITH DIFFERENTIAL/PLATELET
ABS IMMATURE GRANULOCYTES: 0.03 10*3/uL (ref 0.00–0.07)
Basophils Absolute: 0.1 10*3/uL (ref 0.0–0.1)
Basophils Relative: 1 %
Eosinophils Absolute: 0.2 10*3/uL (ref 0.0–0.5)
Eosinophils Relative: 1 %
HCT: 39.6 % (ref 36.0–46.0)
Hemoglobin: 12.2 g/dL (ref 12.0–15.0)
Immature Granulocytes: 0 %
Lymphocytes Relative: 13 %
Lymphs Abs: 1.6 10*3/uL (ref 0.7–4.0)
MCH: 30.2 pg (ref 26.0–34.0)
MCHC: 30.8 g/dL (ref 30.0–36.0)
MCV: 98 fL (ref 80.0–100.0)
MONOS PCT: 8 %
Monocytes Absolute: 1 10*3/uL (ref 0.1–1.0)
Neutro Abs: 9.4 10*3/uL — ABNORMAL HIGH (ref 1.7–7.7)
Neutrophils Relative %: 77 %
Platelets: 277 10*3/uL (ref 150–400)
RBC: 4.04 MIL/uL (ref 3.87–5.11)
RDW: 13.4 % (ref 11.5–15.5)
WBC: 12.3 10*3/uL — ABNORMAL HIGH (ref 4.0–10.5)
nRBC: 0 % (ref 0.0–0.2)

## 2018-12-09 LAB — COMPREHENSIVE METABOLIC PANEL
ALT: 20 U/L (ref 0–44)
AST: 36 U/L (ref 15–41)
Albumin: 4 g/dL (ref 3.5–5.0)
Alkaline Phosphatase: 105 U/L (ref 38–126)
Anion gap: 10 (ref 5–15)
BUN: 12 mg/dL (ref 8–23)
CO2: 26 mmol/L (ref 22–32)
Calcium: 9.2 mg/dL (ref 8.9–10.3)
Chloride: 102 mmol/L (ref 98–111)
Creatinine, Ser: 0.81 mg/dL (ref 0.44–1.00)
GFR calc non Af Amer: >60 mL/min (ref >60)
Glucose, Bld: 105 mg/dL — ABNORMAL HIGH (ref 70–99)
Potassium: 4 mmol/L (ref 3.5–5.1)
Sodium: 138 mmol/L (ref 135–145)
Total Bilirubin: 0.6 mg/dL (ref 0.3–1.2)
Total Protein: 7.7 g/dL (ref 6.5–8.1)

## 2018-12-09 LAB — TROPONIN I: Troponin I: <0.03 ng/mL (ref <0.03)

## 2018-12-09 LAB — LIPASE, BLOOD: Lipase: 35 U/L (ref 11–51)

## 2018-12-09 MED ORDER — ALUM & MAG HYDROXIDE-SIMETH 200-200-20 MG/5ML PO SUSP
30.0000 mL | Freq: Once | ORAL | Status: AC
  Administered 2018-12-09: 30 mL via ORAL
  Filled 2018-12-09: qty 30

## 2018-12-09 MED ORDER — FAMOTIDINE 20 MG PO TABS
20.0000 mg | ORAL_TABLET | Freq: Once | ORAL | Status: AC
  Administered 2018-12-09: 20 mg via ORAL
  Filled 2018-12-09: qty 1

## 2018-12-09 MED ORDER — OMEPRAZOLE 20 MG PO CPDR: DELAYED_RELEASE_CAPSULE | ORAL | 0 refills | Status: DC

## 2018-12-09 NOTE — ED Notes (Addendum)
Pt to xr 

## 2018-12-09 NOTE — ED Provider Notes (Signed)
[facility] EMERGENCY DEPARTMENT Provider Note   CSN: [id number] Arrival date & time: 12/09/18  1478  Time seen 5:50 AM   History   Chief Complaint Chief Complaint  Patient presents with  . Abdominal Pain    HPI Lindsey Mullins is a 73 y.o. female.  HPI patient states she forgot there was no school today and she got up 3:45 to get ready to go to work.  About 4:15 AM she started having pain in the center of her chest that she describes as a soreness and burning that goes up into her throat.  She states it hurts to swallow and it hurts when she lays down.  She did not eat dinner but states she ate roast beef yesterday morning and peanut butter.  She states she did not need anything different.  She been having this discomfort off and on for the past 3 to 4 years and states that happens at night and also in the daytime.  She states she feels very hot before it starts and then she starts a burning inside.  She feels like she is going to pass out although she does not.  She denies any nausea or vomiting.  She states sometimes going out and breathing cold night air makes it feel better.  She states her chest has been tight for a few days.  She has been prescribed stomach acid medication however she states they make her nauseated.  She only takes her Zantac "sometimes".  She states she can take the over-the-counter Zantac from [facility] without difficulty and she was advised to continue taking them.  She also notices that Tums will help.  PCP [name], MD   Past Medical History:  Diagnosis Date  . Atypical chest pain 05/23/2012   STRESS TEST - small to moderate sized area of partial reversibility of the anteroapical wall, most likely breast artifact, post-stress EF 67%, EKG show NSR at 65, No Lexiscan EKG changes, non-diagnostic for ischemia; STRESS TEST, 01/25/2010 - normal study, post-stress EF 66%, no significant ischemia  . Cerebral atherosclerosis 09/29/2012   CAROTID DUPLEX - RIGHT   BULB/PROXIMAL ICA-moderate amount of fibrous plaque, 50-69% diameter reduction; LEFT CEA-normal, no significant diameter reduction  . GERD (gastroesophageal reflux disease)   . Hyperlipidemia   . Hypertension   . Osteopenia   . Restless leg syndrome   . Shortness of breath 03/30/2005   2D ECHO - EF >55%, normal  . TIA (transient ischemic attack) 01/27/2010   2D ECHO - EF 65%, normal    Patient Active Problem List   Diagnosis Date Noted  . Osteopenia   . DDD (degenerative disc disease), lumbar 05/11/2016  . Obesity 05/11/2016  . Glucose intolerance (impaired glucose tolerance) 01/12/2016  . Pain in the chest   . Chest pain 01/10/2016  . GERD (gastroesophageal reflux disease) 01/26/2015  . Stress and adjustment reaction 01/26/2015  . Dysuria 11/30/2013  . Carotid artery disease ([place]) 04/20/2013  . Bunion, left 09/21/2012  . Overweight(278.02) 06/04/2012  . Back pain 04/18/2012  . Essential hypertension, benign 04/02/2012  . Hyperlipidemia 04/02/2012  . Dysphagia 04/02/2012  . RLS (restless legs syndrome) 04/02/2012    Past Surgical History:  Procedure Laterality Date  . ABDOMINAL EXPLORATION SURGERY    . CAROTID ENDARTERECTOMY    . Peripheral Vascular Catheterization  07/16/2005   Right verterbral-50% smooth segmental badsilar artery stenosis on right side, Right carotid-50% ulcerated proxmial stenosis, Left carotid-60 to 70% left externam carotid artery stenosis, 90%  proximal left ICA stenosis     OB History    Gravida  2   Para  2   Term  2   Preterm      AB      Living        SAB      TAB      Ectopic      Multiple      Live Births               Home Medications    Prior to Admission medications   Medication Sig Start Date End Date Taking? Authorizing Provider  albuterol (PROVENTIL HFA;VENTOLIN HFA) 108 (90 Base) MCG/ACT inhaler Inhale into the lungs every 6 (six) hours as needed for wheezing or shortness of breath.   Yes [provider]  aspirin 81 MG tablet Take 81 mg by mouth every evening.    Yes [provider]  carvedilol (COREG) 3.125 MG tablet TAKE 1 TABLET BY MOUTH ONCE DAILY 08/25/18  Yes [place], Modena Nunnery, MD  cyclobenzaprine (FLEXERIL) 5 MG tablet TAKE 1 TABLET BY MOUTH AT BEDTIME AS NEEDED FOR MUSCLE SPASM OR  LEG  CRAMPS 06/10/18  Yes [place], Modena Nunnery, MD  loratadine (CLARITIN) 10 MG tablet Take 10 mg by mouth daily.   Yes [provider]  losartan (COZAAR) 50 MG tablet TAKE 1/2 (ONE-HALF) TABLET BY MOUTH ONCE DAILY 06/10/18  Yes Lorretta Harp, MD  Magnesium 250 MG TABS Take 250 mg by mouth 2 (two) times daily.   Yes [provider]  Multiple Vitamin (MULTIVITAMIN) capsule Take 1 capsule by mouth daily.   Yes [provider]  nitroGLYCERIN (NITROSTAT) 0.4 MG SL tablet DISSOLVE ONE TABLET UNDER THE TONGUE AS NEEDED FOR CHEST PAIN**MAX 3 TABLETS EACH 5 MINUTES APART** 09/14/16  Yes [place], Modena Nunnery, MD  Omega-3 Krill Oil 500 MG CAPS Take 1 capsule by mouth daily.   Yes [provider]  ranitidine (ZANTAC) 150 MG capsule Take 1 capsule (150 mg total) by mouth daily with breakfast. 07/24/18  Yes Susy Frizzle, MD  rosuvastatin (CRESTOR) 20 MG tablet Take 1 tablet (20 mg total) by mouth daily. 07/30/18  Yes Susy Frizzle, MD  traMADol (ULTRAM) 50 MG tablet Take 1 tablet (50 mg total) by mouth every 8 (eight) hours as needed. 12/16/17  Yes [place], Modena Nunnery, MD  omeprazole (PRILOSEC) 20 MG capsule Take 1 po BID x 2 weeks then once a day 12/09/18   Rolland Porter, MD    Family History Family History  Problem Relation Age of Onset  . Heart disease Mother   . Hypertension Mother   . Hypertension Father   . Heart disease Father     Social History Social History   Tobacco Use  . Smoking status: Former Smoker    Last attempt to quit: 04/21/1983    Years since quitting: 35.6  . Smokeless tobacco: Never Used  Substance Use Topics  . Alcohol use: No  . Drug use: No    employed   Allergies   Codeine; Lipitor [atorvastatin]; Lisinopril; Penicillins; and Pravastatin   Review of Systems Review of Systems  All other systems reviewed and are negative.    Physical Exam Updated Vital Signs BP 139/77 (BP Location: Left Arm)   Pulse 80   Temp 98.1 F (36.7 C) (Oral)   Resp 14   Ht 5\' 6"  (1.676 m)   Wt 86.2 kg   SpO2 100%   BMI  30.67 kg/m   Vital signs normal    Physical Exam Vitals signs and nursing note reviewed.  Constitutional:      General: She is not in acute distress.    Appearance: Normal appearance. She is well-developed. She is not ill-appearing or toxic-appearing.  HENT:     Head: Normocephalic and atraumatic.     Right Ear: External ear normal.     Left Ear: External ear normal.     Nose: Nose normal. No mucosal edema or rhinorrhea.     Mouth/Throat:     Dentition: No dental abscesses.     Pharynx: No uvula swelling.  Eyes:     Conjunctiva/sclera: Conjunctivae normal.     Pupils: Pupils are equal, round, and reactive to light.  Neck:     Musculoskeletal: Full passive range of motion without pain, normal range of motion and neck supple.  Cardiovascular:     Rate and Rhythm: Normal rate and regular rhythm.     Heart sounds: Normal heart sounds. No murmur. No friction rub. No gallop.   Pulmonary:     Effort: Pulmonary effort is normal. No respiratory distress.     Breath sounds: Normal breath sounds. No wheezing, rhonchi or rales.  Chest:     Chest wall: No tenderness or crepitus.       Comments: Area of discomfort noted Abdominal:     General: Bowel sounds are normal. There is no distension.     Palpations: Abdomen is soft.     Tenderness: There is no abdominal tenderness. There is no guarding or rebound.  Musculoskeletal: Normal range of motion.        General: No tenderness.     Comments: Moves all extremities well.   Skin:    General: Skin is warm and dry.     Coloration: Skin is not pale.     Findings: No  erythema or rash.  Neurological:     Mental Status: She is alert and oriented to person, place, and time.     Cranial Nerves: No cranial nerve deficit.  Psychiatric:        Mood and Affect: Mood is not anxious.        Speech: Speech normal.        Behavior: Behavior normal.      ED Treatments / Results  Labs (all labs ordered are listed, but only abnormal results are displayed) Results for orders placed or performed during the hospital encounter of 12/09/18  Comprehensive metabolic panel  Result Value Ref Range   Sodium 138 135 - 145 mmol/L   Potassium 4.0 3.5 - 5.1 mmol/L   Chloride 102 98 - 111 mmol/L   CO2 26 22 - 32 mmol/L   Glucose, Bld 105 (H) 70 - 99 mg/dL   BUN 12 8 - 23 mg/dL   Creatinine, Ser 0.81 0.44 - 1.00 mg/dL   Calcium 9.2 8.9 - 10.3 mg/dL   Total Protein 7.7 6.5 - 8.1 g/dL   Albumin 4.0 3.5 - 5.0 g/dL   AST 36 15 - 41 U/L   ALT 20 0 - 44 U/L   Alkaline Phosphatase 105 38 - 126 U/L   Total Bilirubin 0.6 0.3 - 1.2 mg/dL   GFR calc non Af Amer >60 >60 mL/min   GFR calc Af Amer >60 >60 mL/min   Anion gap 10 5 - 15  Lipase, blood  Result Value Ref Range   Lipase 35 11 - 51 U/L  Troponin I - Once  Result Value Ref Range   Troponin I <0.03 <0.03 ng/mL  CBC with Differential  Result Value Ref Range   WBC 12.3 (H) 4.0 - 10.5 K/uL   RBC 4.04 3.87 - 5.11 MIL/uL   Hemoglobin 12.2 12.0 - 15.0 g/dL   HCT 39.6 36.0 - 46.0 %   MCV 98.0 80.0 - 100.0 fL   MCH 30.2 26.0 - 34.0 pg   MCHC 30.8 30.0 - 36.0 g/dL   RDW 13.4 11.5 - 15.5 %   Platelets 277 150 - 400 K/uL   nRBC 0.0 0.0 - 0.2 %   Neutrophils Relative % 77 %   Neutro Abs 9.4 (H) 1.7 - 7.7 K/uL   Lymphocytes Relative 13 %   Lymphs Abs 1.6 0.7 - 4.0 K/uL   Monocytes Relative 8 %   Monocytes Absolute 1.0 0.1 - 1.0 K/uL   Eosinophils Relative 1 %   Eosinophils Absolute 0.2 0.0 - 0.5 K/uL   Basophils Relative 1 %   Basophils Absolute 0.1 0.0 - 0.1 K/uL   Immature Granulocytes 0 %   Abs Immature  Granulocytes 0.03 0.00 - 0.07 K/uL   Laboratory interpretation all normal except leukocytosis    EKG EKG Interpretation  Date/Time:  Tuesday December 09 2018 05:40:58 EST Ventricular Rate:  79 PR Interval:    QRS Duration: 55 QT Interval:  514 QTC Calculation: 590 R Axis:   76 Text Interpretation:  Sinus rhythm Nonspecific T abnrm, anterolateral leads Prolonged QT interval Baseline wander No significant change since last tracing 17 Dec 2017 Confirmed by Rolland Porter [phone]) on 12/09/2018 5:47:00 AM   Radiology Dg Chest 2 View  Result Date: 12/09/2018 CLINICAL DATA:  73 year old female with epigastric pain since 0430 hours. Chest pain and burning. Query hiatal hernia. EXAM: CHEST - 2 VIEW COMPARISON:  01/18/2018 and earlier. FINDINGS: Normal cardiac size and mediastinal contours. No evidence of gastric hiatal hernia. Paucity of bowel gas and no pneumoperitoneumin the upper abdomen. Abdominal Calcified aortic atherosclerosis. Visualized tracheal air column is within normal limits. No pneumothorax, pleural effusion or confluent pulmonary opacity. Mildly increased interstitial markings diffusely appears stable. No acute pulmonary opacity. No acute osseous abnormality identified. IMPRESSION: 1.  No acute cardiopulmonary abnormality. 2. No evidence of a hiatal hernia. Paucity of bowel gas in the upper abdomen with no free air. Electronically Signed   By: Genevie Ann M.D.   On: 12/09/2018 06:49    Procedures Procedures (including critical care time)  Medications Ordered in ED Medications  famotidine (PEPCID) tablet 20 mg (20 mg Oral Given 12/09/18 0621)  alum & mag hydroxide-simeth (MAALOX/MYLANTA) 200-200-20 MG/5ML suspension 30 mL (30 mLs Oral Given 12/09/18 6045)     Initial Impression / Assessment and Plan / ED Course  I have reviewed the triage vital signs and the nursing notes.  Pertinent labs & imaging results that were available during my care of the patient were reviewed by me and  considered in my medical decision making (see chart for details).   Patient was given Maalox and Mylanta and oral Pepcid.  Recheck at 7:20 AM patient is sleeping.  Her husband states she has been resting comfortably.  She states her symptoms have improved since she got the medication.  We discussed if she is able to tolerate the oral Zantac she should take that, I will also try to see if she can take omeprazole.  Delta troponin was done at 9 am to make sure this is not a cardiac event masquerading as GERD.  Dr Rogene Houston will check her delta troponin.   Final Clinical Impressions(s) / ED Diagnoses   Final diagnoses:  Gastroesophageal reflux disease, esophagitis presence not specified    ED Discharge Orders         Ordered    omeprazole (PRILOSEC) 20 MG capsule     12/09/18 0738         Plan discharge if delta troponin is negative.  Rolland Porter, MD, Barbette Or, MD 12/09/18 [phone]

## 2018-12-09 NOTE — ED Triage Notes (Addendum)
Pt with epigastric pain since 0430 this morning. States she feels "hot" and like she is going to pass out.

## 2018-12-09 NOTE — ED Provider Notes (Signed)
Second troponin negative.  Patient stable for discharge home.   Fredia Sorrow, MD 12/09/18 [phone]

## 2018-12-09 NOTE — Discharge Instructions (Addendum)
Either take the zantac OTC twice a day for 2 weeks then once a day or try the omeprazole as prescribed. Look at the diet information to make sure you are following those suggestions.  Let your doctor know about your ED visit tonight.

## 2018-12-09 NOTE — ED Notes (Signed)
Lab in room to stick patient

## 2018-12-19 ENCOUNTER — Encounter: Payer: Self-pay | Admitting: Family Medicine

## 2018-12-19 ENCOUNTER — Other Ambulatory Visit: Payer: Self-pay

## 2018-12-19 ENCOUNTER — Ambulatory Visit
Admission: RE | Admit: 2018-12-19 | Discharge: 2018-12-19 | Disposition: A | Discharge status: Home / Self Care | Payer: Medicare Other | Source: Ambulatory Visit | Attending: Family Medicine | Admitting: Family Medicine

## 2018-12-19 ENCOUNTER — Ambulatory Visit: Payer: Medicare Other | Admitting: Family Medicine

## 2018-12-19 VITALS — BP 130/64 | HR 82 | Temp 98.1°F | Resp 14 | Ht 66.5 in | Wt 186.0 lb

## 2018-12-19 DIAGNOSIS — R634 Abnormal weight loss: Secondary | ICD-10-CM | POA: Diagnosis not present

## 2018-12-19 DIAGNOSIS — K551 Chronic vascular disorders of intestine: Secondary | ICD-10-CM | POA: Diagnosis not present

## 2018-12-19 DIAGNOSIS — R1010 Upper abdominal pain, unspecified: Secondary | ICD-10-CM

## 2018-12-19 DIAGNOSIS — I10 Essential (primary) hypertension: Secondary | ICD-10-CM

## 2018-12-19 NOTE — Patient Instructions (Signed)
CT scan to be done

## 2018-12-19 NOTE — Progress Notes (Signed)
   Subjective:    Patient ID: Lindsey Mullins, female    DOB: 1946-08-01, 73 y.o.   MRN: [id number]  Patient presents for ER F/U (abd pain- reflux) and Back Pain (right sided back pain from shoulder blade to lower back)  Pt here for ER follow up. Seen in ER on 1/21, she woke up in middle of night felt warm all over and had severe stomach pain. She has had episodes on and off for years. States pain was very severe it wrapped around to right side to lower back. Pain was so severe she was doubled over in pain. No vomiting or diarrhea, denies any acid or reflux symptoms.  In ER- troponin negative, EKG unremarkable , metabolic and lipase were normal, CBC had elevated WBC at 12.3  CXR done, which was negative for acute changes  She was given diagnosis of GERD, given prilosec 20mg  twice a day , she has not been taking ranitidine  Her weight is down 7lbs since Sept, she has only drinking water, eats crackers, peanut butter/deli  sandwhich  She did try sipping some PEPsi,last week and had severe stomach pain again      Feels losartan makes her sick and dizzy so she stopped taking   Review Of Systems:  GEN- denies fatigue, fever, +weight loss,weakness, recent illness HEENT- denies eye drainage, change in vision, nasal discharge, CVS- denies chest pain, palpitations RESP- denies SOB, cough, wheeze ABD- denies N/V, change in stools, +abd pain GU- denies dysuria, hematuria, dribbling, incontinence MSK- denies joint pain, muscle aches, injury Neuro- denies headache, dizziness, syncope, seizure activity       Objective:    BP 130/64   Pulse 82   Temp 98.1 F (36.7 C) (Oral)   Resp 14   Ht 5' 6.5" (1.689 m)   Wt 186 lb (84.4 kg)   SpO2 98%   BMI 29.57 kg/m  GEN- NAD, alert and oriented x3, weight down 7lbs  HEENT- PERRL, EOMI, non injected sclera, pink conjunctiva, MMM, oropharynx clear Neck- Supple, no thyromegaly CVS- RRR, no murmur RESP-CTAB ABD-NABS,soft,TTP across upper abdomen,,  no rebound, no guarding  EXT- No edema Pulses- Radial, 2+        Assessment & Plan:      Problem List Items Addressed This Visit      Unprioritized   Essential hypertension, benign - Primary    BP controlled, stay off losartan as it causing her to be dizzy Continue Coreg        Other Visit Diagnoses    Pain of upper abdomen       Concern for ongoing pain with weigh tloss at her age, possible gastritis, gallbladder etiology, no classic signs of colitis, had leukocytosis as well. obtain CT   Abnormal weight loss       Relevant Orders   CT Abdomen Pelvis Wo Contrast (Completed)      Note: This dictation was prepared with Dragon dictation along with smaller phrase technology. Any transcriptional errors that result from this process are unintentional.

## 2018-12-19 NOTE — Addendum Note (Signed)
Addended by: Vic Blackbird F on: 12/19/2018 03:24 PM   Modules accepted: Orders

## 2018-12-19 NOTE — Assessment & Plan Note (Signed)
BP controlled, stay off losartan as it causing her to be dizzy Continue Coreg

## 2018-12-22 ENCOUNTER — Emergency Department (HOSPITAL_BASED_OUTPATIENT_CLINIC_OR_DEPARTMENT_OTHER)
Admit: 2018-12-22 | Discharge: 2018-12-22 | Disposition: A | Discharge status: Home / Self Care | Payer: Medicare Other | Attending: Vascular Surgery | Admitting: Vascular Surgery

## 2018-12-22 ENCOUNTER — Inpatient Hospital Stay (HOSPITAL_COMMUNITY)
Admission: EM | Admit: 2018-12-22 | Discharge: 2018-12-24 | DRG: 386 | Disposition: A | Discharge status: Home / Self Care | Payer: Medicare Other | Attending: Internal Medicine | Admitting: Internal Medicine

## 2018-12-22 ENCOUNTER — Emergency Department (HOSPITAL_COMMUNITY): Payer: Medicare Other

## 2018-12-22 ENCOUNTER — Other Ambulatory Visit: Payer: Self-pay

## 2018-12-22 ENCOUNTER — Encounter (HOSPITAL_COMMUNITY): Payer: Self-pay | Admitting: *Deleted

## 2018-12-22 DIAGNOSIS — Z79899 Other long term (current) drug therapy: Secondary | ICD-10-CM | POA: Diagnosis not present

## 2018-12-22 DIAGNOSIS — K219 Gastro-esophageal reflux disease without esophagitis: Secondary | ICD-10-CM | POA: Diagnosis not present

## 2018-12-22 DIAGNOSIS — R109 Unspecified abdominal pain: Secondary | ICD-10-CM | POA: Diagnosis present

## 2018-12-22 DIAGNOSIS — Z7982 Long term (current) use of aspirin: Secondary | ICD-10-CM

## 2018-12-22 DIAGNOSIS — R1032 Left lower quadrant pain: Secondary | ICD-10-CM

## 2018-12-22 DIAGNOSIS — E785 Hyperlipidemia, unspecified: Secondary | ICD-10-CM | POA: Diagnosis not present

## 2018-12-22 DIAGNOSIS — R52 Pain, unspecified: Secondary | ICD-10-CM | POA: Diagnosis not present

## 2018-12-22 DIAGNOSIS — R1084 Generalized abdominal pain: Secondary | ICD-10-CM

## 2018-12-22 DIAGNOSIS — I2510 Atherosclerotic heart disease of native coronary artery without angina pectoris: Secondary | ICD-10-CM | POA: Diagnosis not present

## 2018-12-22 DIAGNOSIS — R112 Nausea with vomiting, unspecified: Secondary | ICD-10-CM | POA: Diagnosis present

## 2018-12-22 DIAGNOSIS — Z8673 Personal history of transient ischemic attack (TIA), and cerebral infarction without residual deficits: Secondary | ICD-10-CM

## 2018-12-22 DIAGNOSIS — G2581 Restless legs syndrome: Secondary | ICD-10-CM | POA: Diagnosis present

## 2018-12-22 DIAGNOSIS — K529 Noninfective gastroenteritis and colitis, unspecified: Secondary | ICD-10-CM | POA: Diagnosis not present

## 2018-12-22 DIAGNOSIS — I774 Celiac artery compression syndrome: Secondary | ICD-10-CM | POA: Diagnosis present

## 2018-12-22 DIAGNOSIS — I708 Atherosclerosis of other arteries: Secondary | ICD-10-CM

## 2018-12-22 DIAGNOSIS — Z87891 Personal history of nicotine dependence: Secondary | ICD-10-CM

## 2018-12-22 DIAGNOSIS — I10 Essential (primary) hypertension: Secondary | ICD-10-CM | POA: Diagnosis not present

## 2018-12-22 HISTORY — DX: Noninfective gastroenteritis and colitis, unspecified: K52.9

## 2018-12-22 LAB — CBC WITH DIFFERENTIAL/PLATELET
Abs Immature Granulocytes: 0.07 10*3/uL (ref 0.00–0.07)
BASOS ABS: 0.1 10*3/uL (ref 0.0–0.1)
Basophils Relative: 1 %
Eosinophils Absolute: 0.2 10*3/uL (ref 0.0–0.5)
Eosinophils Relative: 1 %
HCT: 41.7 % (ref 36.0–46.0)
Hemoglobin: 12.8 g/dL (ref 12.0–15.0)
Immature Granulocytes: 1 %
Lymphocytes Relative: 11 %
Lymphs Abs: 1.6 10*3/uL (ref 0.7–4.0)
MCH: 30.3 pg (ref 26.0–34.0)
MCHC: 30.7 g/dL (ref 30.0–36.0)
MCV: 98.8 fL (ref 80.0–100.0)
Monocytes Absolute: 0.9 10*3/uL (ref 0.1–1.0)
Monocytes Relative: 6 %
NEUTROS ABS: 12.4 10*3/uL — AB (ref 1.7–7.7)
Neutrophils Relative %: 80 %
Platelets: 356 10*3/uL (ref 150–400)
RBC: 4.22 MIL/uL (ref 3.87–5.11)
RDW: 13.4 % (ref 11.5–15.5)
WBC: 15.3 10*3/uL — ABNORMAL HIGH (ref 4.0–10.5)
nRBC: 0 % (ref 0.0–0.2)

## 2018-12-22 LAB — COMPREHENSIVE METABOLIC PANEL
ALT: 18 U/L (ref 0–44)
AST: 37 U/L (ref 15–41)
Albumin: 4 g/dL (ref 3.5–5.0)
Alkaline Phosphatase: 78 U/L (ref 38–126)
Anion gap: 10 (ref 5–15)
BILIRUBIN TOTAL: 0.3 mg/dL (ref 0.3–1.2)
BUN: 14 mg/dL (ref 8–23)
CO2: 22 mmol/L (ref 22–32)
Calcium: 8.9 mg/dL (ref 8.9–10.3)
Chloride: 108 mmol/L (ref 98–111)
Creatinine, Ser: 0.8 mg/dL (ref 0.44–1.00)
GFR calc Af Amer: >60 mL/min (ref >60)
GFR calc non Af Amer: >60 mL/min (ref >60)
Glucose, Bld: 90 mg/dL (ref 70–99)
Potassium: 3.5 mmol/L (ref 3.5–5.1)
Sodium: 140 mmol/L (ref 135–145)
Total Protein: 7.8 g/dL (ref 6.5–8.1)

## 2018-12-22 LAB — LACTIC ACID, PLASMA
Lactic Acid, Venous: 1.3 mmol/L (ref 0.5–1.9)
Lactic Acid, Venous: 0.8 mmol/L (ref 0.5–1.9)

## 2018-12-22 LAB — LIPASE, BLOOD: Lipase: 48 U/L (ref 11–51)

## 2018-12-22 LAB — MAGNESIUM: Magnesium: 2.2 mg/dL (ref 1.7–2.4)

## 2018-12-22 MED ORDER — MORPHINE SULFATE (PF) 2 MG/ML IV SOLN
INTRAVENOUS | Status: AC
  Administered 2018-12-22: 6 mg via INTRAVENOUS
  Filled 2018-12-22: qty 3

## 2018-12-22 MED ORDER — ALBUTEROL SULFATE HFA 108 (90 BASE) MCG/ACT IN AERS: 2.0000 | INHALATION_SPRAY | Freq: Four times a day (QID) | RESPIRATORY_TRACT | Status: DC | PRN

## 2018-12-22 MED ORDER — LACTATED RINGERS IV BOLUS
1000.0000 mL | Freq: Once | INTRAVENOUS | Status: AC
  Administered 2018-12-22: 1000 mL via INTRAVENOUS

## 2018-12-22 MED ORDER — MORPHINE SULFATE (PF) 4 MG/ML IV SOLN: 6.0000 mg | INTRAVENOUS | Status: DC | PRN

## 2018-12-22 MED ORDER — ROSUVASTATIN CALCIUM 20 MG PO TABS
20.0000 mg | ORAL_TABLET | Freq: Every day | ORAL | Status: DC
  Administered 2018-12-23: 20 mg via ORAL
  Filled 2018-12-22: qty 1

## 2018-12-22 MED ORDER — ENOXAPARIN SODIUM 40 MG/0.4ML ~~LOC~~ SOLN
40.0000 mg | SUBCUTANEOUS | Status: DC
  Administered 2018-12-22: 40 mg via SUBCUTANEOUS
  Filled 2018-12-22: qty 0.4

## 2018-12-22 MED ORDER — CARVEDILOL 3.125 MG PO TABS
3.1250 mg | ORAL_TABLET | Freq: Every day | ORAL | Status: DC
  Administered 2018-12-22 – 2018-12-23 (×2): 3.125 mg via ORAL
  Filled 2018-12-22 (×2): qty 1

## 2018-12-22 MED ORDER — LEVOFLOXACIN IN D5W 750 MG/150ML IV SOLN
750.0000 mg | INTRAVENOUS | Status: DC
  Filled 2018-12-22: qty 150

## 2018-12-22 MED ORDER — ADULT MULTIVITAMIN W/MINERALS CH
1.0000 | ORAL_TABLET | Freq: Every day | ORAL | Status: DC
  Administered 2018-12-23 – 2018-12-24 (×2): 1 via ORAL
  Filled 2018-12-22 (×2): qty 1

## 2018-12-22 MED ORDER — PANTOPRAZOLE SODIUM 40 MG PO TBEC
40.0000 mg | DELAYED_RELEASE_TABLET | Freq: Every day | ORAL | Status: DC
  Administered 2018-12-23 – 2018-12-24 (×2): 40 mg via ORAL
  Filled 2018-12-22 (×2): qty 1

## 2018-12-22 MED ORDER — METRONIDAZOLE IN NACL 5-0.79 MG/ML-% IV SOLN
500.0000 mg | Freq: Once | INTRAVENOUS | Status: AC
  Administered 2018-12-22: 500 mg via INTRAVENOUS
  Filled 2018-12-22: qty 100

## 2018-12-22 MED ORDER — LORATADINE 10 MG PO TABS
10.0000 mg | ORAL_TABLET | Freq: Every day | ORAL | Status: DC
  Administered 2018-12-23 – 2018-12-24 (×2): 10 mg via ORAL
  Filled 2018-12-22 (×2): qty 1

## 2018-12-22 MED ORDER — LACTATED RINGERS IV BOLUS: 1000.0000 mL | Freq: Once | INTRAVENOUS | Status: DC

## 2018-12-22 MED ORDER — LOSARTAN POTASSIUM 50 MG PO TABS
25.0000 mg | ORAL_TABLET | Freq: Every day | ORAL | Status: DC
  Administered 2018-12-23: 25 mg via ORAL
  Filled 2018-12-22 (×2): qty 1

## 2018-12-22 MED ORDER — LEVOFLOXACIN IN D5W 750 MG/150ML IV SOLN
750.0000 mg | Freq: Once | INTRAVENOUS | Status: AC
  Administered 2018-12-22: 750 mg via INTRAVENOUS
  Filled 2018-12-22: qty 150

## 2018-12-22 MED ORDER — ACETAMINOPHEN 325 MG PO TABS
650.0000 mg | ORAL_TABLET | Freq: Four times a day (QID) | ORAL | Status: DC | PRN
  Administered 2018-12-22: 650 mg via ORAL
  Administered 2018-12-24: 325 mg via ORAL
  Filled 2018-12-22 (×2): qty 2

## 2018-12-22 MED ORDER — IOPAMIDOL (ISOVUE-370) INJECTION 76%
100.0000 mL | Freq: Once | INTRAVENOUS | Status: AC | PRN
  Administered 2018-12-22: 100 mL via INTRAVENOUS

## 2018-12-22 MED ORDER — ASPIRIN EC 81 MG PO TBEC
81.0000 mg | DELAYED_RELEASE_TABLET | Freq: Every evening | ORAL | Status: DC
  Administered 2018-12-22 – 2018-12-24 (×3): 81 mg via ORAL
  Filled 2018-12-22 (×3): qty 1

## 2018-12-22 MED ORDER — POTASSIUM CHLORIDE IN NACL 20-0.9 MEQ/L-% IV SOLN
INTRAVENOUS | Status: DC
  Administered 2018-12-22 (×2): via INTRAVENOUS
  Filled 2018-12-22 (×4): qty 1000

## 2018-12-22 MED ORDER — METRONIDAZOLE IN NACL 5-0.79 MG/ML-% IV SOLN
500.0000 mg | Freq: Three times a day (TID) | INTRAVENOUS | Status: DC
  Administered 2018-12-22 – 2018-12-24 (×5): 500 mg via INTRAVENOUS
  Filled 2018-12-22 (×5): qty 100

## 2018-12-22 NOTE — Consult Note (Signed)
Referring Physician: Dr Robert Bellow   Patient name: Lindsey Mullins MRN: [id number] DOB: Sep 06, 1946 Sex: female  REASON FOR CONSULT: abdominal pain  HPI: Lindsey Mullins is a 73 y.o. female,with a 1 week history of primarily right side abdominal pain with some radiation to the right flank.  Pt denies prior events.  She has no history of food fear or postprandial abdominal pain.  She has no nausea or vomiting.  Pt is much improved from earlier today.  Only prior operation was an ex lap 15 years ago.  She says she has lost about 12 pounds over the last month.  Her diet consists mainly of [place] sandwiches and peanut butter.  She did have one episode of diarrhea today. Other medical problems include PAD with prior CEA by Dr Amedeo Plenty 2006, hypertension, hyperlipid all of which have been stable.  Past Medical History:  Diagnosis Date  . Atypical chest pain 05/23/2012   STRESS TEST - small to moderate sized area of partial reversibility of the anteroapical wall, most likely breast artifact, post-stress EF 67%, EKG show NSR at 65, No Lexiscan EKG changes, non-diagnostic for ischemia; STRESS TEST, 01/25/2010 - normal study, post-stress EF 66%, no significant ischemia  . Cerebral atherosclerosis 09/29/2012   CAROTID DUPLEX - RIGHT  BULB/PROXIMAL ICA-moderate amount of fibrous plaque, 50-69% diameter reduction; LEFT CEA-normal, no significant diameter reduction  . GERD (gastroesophageal reflux disease)   . Hyperlipidemia   . Hypertension   . Osteopenia   . Restless leg syndrome   . Shortness of breath 03/30/2005   2D ECHO - EF >55%, normal  . TIA (transient ischemic attack) 01/27/2010   2D ECHO - EF 65%, normal   Past Surgical History:  Procedure Laterality Date  . ABDOMINAL EXPLORATION SURGERY    . CAROTID ENDARTERECTOMY    . Peripheral Vascular Catheterization  07/16/2005   Right verterbral-50% smooth segmental badsilar artery stenosis on right side, Right carotid-50% ulcerated proxmial  stenosis, Left carotid-60 to 70% left externam carotid artery stenosis, 90% proximal left ICA stenosis    Family History  Problem Relation Age of Onset  . Heart disease Mother   . Hypertension Mother   . Hypertension Father   . Heart disease Father     SOCIAL HISTORY: Social History   Socioeconomic History  . Marital status: Divorced    Spouse name: Not on file  . Number of children: Not on file  . Years of education: Not on file  . Highest education level: Not on file  Occupational History  . Not on file  Social Needs  . Financial resource strain: Not on file  . Food insecurity:    Worry: Not on file    Inability: Not on file  . Transportation needs:    Medical: Not on file    Non-medical: Not on file  Tobacco Use  . Smoking status: Former Smoker    Last attempt to quit: 04/21/1983    Years since quitting: 35.6  . Smokeless tobacco: Never Used  Substance and Sexual Activity  . Alcohol use: No  . Drug use: No  . Sexual activity: Not on file  Lifestyle  . Physical activity:    Days per week: Not on file    Minutes per session: Not on file  . Stress: Not on file  Relationships  . Social connections:    Talks on phone: Not on file    Gets together: Not on file    Attends religious service: Not  on file    Active member of club or organization: Not on file    Attends meetings of clubs or organizations: Not on file    Relationship status: Not on file  . Intimate partner violence:    Fear of current or ex partner: Not on file    Emotionally abused: Not on file    Physically abused: Not on file    Forced sexual activity: Not on file  Other Topics Concern  . Not on file  Social History Narrative  . Not on file    Allergies  Allergen Reactions  . Codeine Nausea And Vomiting  . Lipitor [Atorvastatin] Other (See Comments)    Myalgias  . Lisinopril Cough  . Penicillins Other (See Comments)    Has patient had a PCN reaction causing immediate rash,  facial/tongue/throat swelling, SOB or lightheadedness with hypotension: Unknown Has patient had a PCN reaction causing severe rash involving mucus membranes or skin necrosis: Unknown Has patient had a PCN reaction that required hospitalization: Unknown Has patient had a PCN reaction occurring within the last 10 years: Yes States that she was in ICU after being administered   . Pravastatin Other (See Comments)    Myalgias    Current Facility-Administered Medications  Medication Dose Route Frequency Provider Last Rate Last Dose  . 0.9 % NaCl with KCl 20 mEq/ L  infusion   Intravenous Continuous Reubin Milan, MD 100 mL/hr at 12/22/18 [phone]    . morphine 4 MG/ML injection 6 mg  6 mg Intravenous Q2H PRN Varney Biles, MD       Current Outpatient Medications  Medication Sig Dispense Refill  . albuterol (PROVENTIL HFA;VENTOLIN HFA) 108 (90 Base) MCG/ACT inhaler Inhale into the lungs every 6 (six) hours as needed for wheezing or shortness of breath.    [name] aspirin 81 MG tablet Take 81 mg by mouth every evening.     . carvedilol (COREG) 3.125 MG tablet TAKE 1 TABLET BY MOUTH ONCE DAILY 90 tablet 2  . cyclobenzaprine (FLEXERIL) 5 MG tablet TAKE 1 TABLET BY MOUTH AT BEDTIME AS NEEDED FOR MUSCLE SPASM OR  LEG  CRAMPS 90 tablet 0  . loratadine (CLARITIN) 10 MG tablet Take 10 mg by mouth daily.    [name] losartan (COZAAR) 50 MG tablet TAKE 1/2 (ONE-HALF) TABLET BY MOUTH ONCE DAILY 30 tablet 8  . Magnesium 250 MG TABS Take 250 mg by mouth 2 (two) times daily.    . Multiple Vitamin (MULTIVITAMIN) capsule Take 1 capsule by mouth daily.    . nitroGLYCERIN (NITROSTAT) 0.4 MG SL tablet DISSOLVE ONE TABLET UNDER THE TONGUE AS NEEDED FOR CHEST PAIN**MAX 3 TABLETS EACH 5 MINUTES APART** 25 tablet 3  . Omega-3 Krill Oil 500 MG CAPS Take 1 capsule by mouth daily.    [name] omeprazole (PRILOSEC) 20 MG capsule Take 1 po BID x 2 weeks then once a day 60 capsule 0  . ranitidine (ZANTAC) 150 MG capsule Take 1 capsule (150  mg total) by mouth daily with breakfast. 90 capsule 3  . rosuvastatin (CRESTOR) 20 MG tablet Take 1 tablet (20 mg total) by mouth daily. 90 tablet 3  . traMADol (ULTRAM) 50 MG tablet Take 1 tablet (50 mg total) by mouth every 8 (eight) hours as needed. 30 tablet 0    ROS:   General:  + weight loss,no  Fever,no  chills  HEENT: No recent headaches, no nasal bleeding, no visual changes, no sore throat  Neurologic: No dizziness, blackouts, seizures.  No recent symptoms of stroke or mini- stroke. No recent episodes of slurred speech, or temporary blindness.  Cardiac: No recent episodes of chest pain/pressure, no shortness of breath at rest.  No shortness of breath with exertion.  Denies history of atrial fibrillation or irregular heartbeat  Vascular: No history of rest pain in feet.  No history of claudication.  No history of non-healing ulcer, No history of DVT   Pulmonary: No home oxygen, no productive cough, no hemoptysis,  No asthma or wheezing  Musculoskeletal:  [ ]  Arthritis, [ ]  Low back pain,  [ ]  Joint pain  Hematologic:No history of hypercoagulable state.  No history of easy bleeding.  No history of anemia  Gastrointestinal: No hematochezia or melena,  No gastroesophageal reflux, no trouble swallowing  Urinary: [ ]  chronic Kidney disease, [ ]  on HD - [ ]  MWF or [ ]  TTHS, [ ]  Burning with urination, [ ]  Frequent urination, [ ]  Difficulty urinating;   Skin: No rashes  Psychological: No history of anxiety,  No history of depression   Physical Examination  Vitals:   12/22/18 0737 12/22/18 0800 12/22/18 0815 12/22/18 0924  BP:  (!) 157/89  (!) 162/84  Pulse:   70 70  Resp:    12  Temp: 97.9 F (36.6 C)   97.9 F (36.6 C)  TempSrc: Oral   Oral  SpO2:   99% 99%  Weight:      Height:        Body mass index is 29.89 kg/m.  General:  Alert and oriented, no acute distress HEENT: Normal Neck: No bruit or JVD Pulmonary: Clear to auscultation bilaterally Cardiac: Regular  Rate and Rhythm  Abdomen: Soft, mild tenderness no guarding, non-distended, no mass Skin: No rash Extremity Pulses:  2+ radial, brachial, femoral, 2+ left dorsalis pedis absent right DP absent posterior tibial pulses bilaterally Musculoskeletal: No deformity or edema  Neurologic: Upper and lower extremity motor 5/5 and symmetric  DATA:  CTA  Abdomen pelvis images reviewed.  SMA no significant stenosis, celiac 80% stenosis with filling of distal branches. There are no significant mesenteric branches supplied by the celiac artery. IMA patent no obvious narrowing.  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    CBC    Component Value Date/Time   WBC 15.3 (H) 12/22/2018 0251   RBC 4.22 12/22/2018 0251   HGB 12.8 12/22/2018 0251   HCT 41.7 12/22/2018 0251   PLT 356 12/22/2018 0251   MCV 98.8 12/22/2018 0251   MCH 30.3 12/22/2018 0251   MCHC 30.7 12/22/2018 0251   RDW 13.4 12/22/2018 0251   LYMPHSABS 1.6 12/22/2018 0251   MONOABS 0.9 12/22/2018 0251   EOSABS 0.2 12/22/2018 0251   BASOSABS 0.1 12/22/2018 0251   Albumin LFTs normal  BMET    Component Value Date/Time   NA 140 12/22/2018 0145   K 3.5 12/22/2018 0145   CL 108 12/22/2018 0145   CO2 22 12/22/2018 0145   GLUCOSE 90 12/22/2018 0145   BUN 14 12/22/2018 0145   CREATININE 0.80 12/22/2018 0145   CREATININE 0.81 07/25/2018 0933   CALCIUM 8.9 12/22/2018 0145   GFRNONAA >60 12/22/2018 0145   GFRAA >60 12/22/2018 0145    ASSESSMENT:  Abdominal pain with patent SMA minimal narrowing.  Doubtful this is mesenteric ischemia.  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 Repeat lactate  2.  Will get mesenteric duplex and follow up with her after this.  Most likely other etiology for her abdominal pain.   Ruta Hinds, MD [facility] Office: [phone] Pager: [phone]

## 2018-12-22 NOTE — Progress Notes (Signed)
Lindsey Mullins [id number] Admission Data: 12/22/2018 7:23 PM Attending Provider: Bonnell Public, MD  OIZ:[id number], Modena Nunnery, MD Consults/ Treatment Team:   Lindsey Mullins is a 73 y.o. female patient admitted from ED awake, alert  & orientated  X 3,  Full Code, VSS - Blood pressure (!) 161/77, pulse 69, temperature 97.9 F (36.6 C), temperature source Oral, resp. rate 18, height 5\' 6"  (1.676 m), weight 84 kg, SpO2 100 %.,    Allergies:   Allergies  Allergen Reactions  . Codeine Nausea And Vomiting  . Lipitor [Atorvastatin] Other (See Comments)    Myalgias  . Lisinopril Cough  . Penicillins Other (See Comments)    Has patient had a PCN reaction causing immediate rash, facial/tongue/throat swelling, SOB or lightheadedness with hypotension: Unknown Has patient had a PCN reaction causing severe rash involving mucus membranes or skin necrosis: Unknown Has patient had a PCN reaction that required hospitalization: Unknown Has patient had a PCN reaction occurring within the last 10 years: Yes States that she was in ICU after being administered   . Pravastatin Other (See Comments)    Myalgias     Past Medical History:  Diagnosis Date  . Atypical chest pain 05/23/2012   STRESS TEST - small to moderate sized area of partial reversibility of the anteroapical wall, most likely breast artifact, post-stress EF 67%, EKG show NSR at 65, No Lexiscan EKG changes, non-diagnostic for ischemia; STRESS TEST, 01/25/2010 - normal study, post-stress EF 66%, no significant ischemia  . Cerebral atherosclerosis 09/29/2012   CAROTID DUPLEX - RIGHT  BULB/PROXIMAL ICA-moderate amount of fibrous plaque, 50-69% diameter reduction; LEFT CEA-normal, no significant diameter reduction  . GERD (gastroesophageal reflux disease)   . Hyperlipidemia   . Hypertension   . Osteopenia   . Restless leg syndrome   . Shortness of breath 03/30/2005   2D ECHO - EF >55%, normal  . TIA (transient ischemic attack) 01/27/2010   2D  ECHO - EF 65%, normal    Pt orientation to unit, room and routine. Information packet given to patient/family and safety video watched.  Admission INP armband ID verified with patient/family, and in place. SR up x 2, fall risk assessment complete with Patient and family verbalizing understanding of risks associated with falls. Pt verbalizes an understanding of how to use the call bell and to call for help before getting out of bed.     Will cont to monitor and assist as needed.  Lindsey N Khushboo Chuck, RN 12/22/2018 7:23 PM

## 2018-12-22 NOTE — ED Triage Notes (Signed)
PT ransfered to ED for ADM . Pt had a CTA  And results supported Vascular admission per [facility] Staff. Pt unable to stand and pivot from stretcher to bed . Pt reported she thought that would be hard. Pt reports she walks at home.

## 2018-12-22 NOTE — ED Provider Notes (Addendum)
Patient assessed and Dr. Oneida Alar consulted.  He will be down to see the patient for consultation.  Patient is alert and nontoxic.  No respiratory distress.  Mental status clear.  Heart regular.  Lungs grossly clear.  Patient continues to endorse some epigastric pain to discomfort but no guarding.  Lower extremities no peripheral edema calf soft nontender skin condition excellent.  Patient reports pain is better than it was initially.  She reports at onset it was severe and there is nothing she can do to get comfortable.  She reports now it is more mild but still present in her epigastrium and back.  Dr. Oneida Alar has done consultation.  After completing diagnostic studies does not feel that this is ischemic.  CT does show some colonic inflammatory changes.  Patient does continue to have discomfort that is reproducible without guarding.  She has leukocytosis.  Will start Cipro Flagyl and admit for colitis.  Reviewed with [facility] hospitalist for admission.   Charlesetta Shanks, MD 12/22/18 1002    Charlesetta Shanks, MD 12/22/18 1345

## 2018-12-22 NOTE — Progress Notes (Signed)
Pt duplex shows widely patent SMA celiac common trunk.  I do not believe her pain has a vascular origin.    Would look for other possible etiologies of her abdominal pain  Will sign off.  Ruta Hinds, MD [facility] Office: [phone] Pager: [phone]

## 2018-12-22 NOTE — H&P (Signed)
History and Physical  Lindsey Mullins NFA:[id number] DOB: 02/28/1946 DOA: 12/22/2018  Referring physician: ER physician PCP: [name], MD  Outpatient Specialists:    Patient coming from: Transferred from an apparent hospital  Chief Complaint: Severe abdominal pain and one episode of watery stool.  HPI:  Patient is a 73 year old African-American female past medical history significant for TIA, restless leg syndrome, hypertension, hyperlipidemia, GERD, cerebral atherosclerosis and atypical chest pain.  Patient also endorses prior history of severe abdominal pain.  Patient developed severe abdominal pain last night (around 10:30 PM), pain is rated as 11-12 out of 10, mainly epigastric and left abdominal quadrant area.  Based on the severity of the pain, patient was taken to [facility].  On presentation to [facility], the patient had a bout of large watery stool, nonbloody.  Patient reported that the abdominal pain improved afterwards.  No further watery stool reported.  No nausea or vomiting.  No fever or chills.  No history of postprandial pain or food avoidance.  Patient has lost 12 pounds over 2 weeks, but tells me that she has a poor appetite.  Patient was transferred to [facility] due to concerns for possible ischemic bowel.  Patient has been worked up extensively by the vascular surgery team, and ischemic bowel has been ruled out according to the ER Provider.  Hospitalist team was asked to admit patient for further assessment and management, as the CT abdomen is said to reveal left-sided colitis.  No headache, no neck pain, and as documented above, no fever or chills, no chest pain, no shortness of breath and no urinary syndrome.  Mild leukocytosis is noted.  Lab work done so far revealedWBC 15.3, hemoglobin of 12.8, hematocrit of 41.7 and platelet count of 356.  BMP revealed sodium of 140, potassium of 3.5, chloride of 108, CO2 22, BUN of 14 and creatinine of 0.8  with a blood sugar of 90.  Magnesium is 2.2.  Albumin is 4.  Lipase is 48.  CT scan of the abdomen and pelvis with and without contrast revealed left-sided colitis, mild atheromatous narrowing at the origin of the celiomesenteric trunk and high-grade atheromatous narrowing at the origin of the right common iliac artery.  ED Course: Patient has been managed supportively. Pertinent labs: As documented above. Imaging: independently reviewed.   Review of Systems:  Negative for fever, visual changes, sore throat, rash, new muscle aches, chest pain, SOB, dysuria, bleeding, n/v.  Past Medical History:  Diagnosis Date  . Atypical chest pain 05/23/2012   STRESS TEST - small to moderate sized area of partial reversibility of the anteroapical wall, most likely breast artifact, post-stress EF 67%, EKG show NSR at 65, No Lexiscan EKG changes, non-diagnostic for ischemia; STRESS TEST, 01/25/2010 - normal study, post-stress EF 66%, no significant ischemia  . Cerebral atherosclerosis 09/29/2012   CAROTID DUPLEX - RIGHT  BULB/PROXIMAL ICA-moderate amount of fibrous plaque, 50-69% diameter reduction; LEFT CEA-normal, no significant diameter reduction  . GERD (gastroesophageal reflux disease)   . Hyperlipidemia   . Hypertension   . Osteopenia   . Restless leg syndrome   . Shortness of breath 03/30/2005   2D ECHO - EF >55%, normal  . TIA (transient ischemic attack) 01/27/2010   2D ECHO - EF 65%, normal    Past Surgical History:  Procedure Laterality Date  . ABDOMINAL EXPLORATION SURGERY    . CAROTID ENDARTERECTOMY    . Peripheral Vascular Catheterization  07/16/2005   Right verterbral-50% smooth  segmental badsilar artery stenosis on right side, Right carotid-50% ulcerated proxmial stenosis, Left carotid-60 to 70% left externam carotid artery stenosis, 90% proximal left ICA stenosis     reports that she quit smoking about 35 years ago. She has never used smokeless tobacco. She reports that she does not drink  alcohol or use drugs.  Allergies  Allergen Reactions  . Codeine Nausea And Vomiting  . Lipitor [Atorvastatin] Other (See Comments)    Myalgias  . Lisinopril Cough  . Penicillins Other (See Comments)    Has patient had a PCN reaction causing immediate rash, facial/tongue/throat swelling, SOB or lightheadedness with hypotension: Unknown Has patient had a PCN reaction causing severe rash involving mucus membranes or skin necrosis: Unknown Has patient had a PCN reaction that required hospitalization: Unknown Has patient had a PCN reaction occurring within the last 10 years: Yes States that she was in ICU after being administered   . Pravastatin Other (See Comments)    Myalgias    Family History  Problem Relation Age of Onset  . Heart disease Mother   . Hypertension Mother   . Hypertension Father   . Heart disease Father      Prior to Admission medications   Medication Sig Start Date End Date Taking? Authorizing Provider  carvedilol (COREG) 3.125 MG tablet TAKE 1 TABLET BY MOUTH ONCE DAILY 08/25/18  Yes [place], Modena Nunnery, MD  cyclobenzaprine (FLEXERIL) 5 MG tablet TAKE 1 TABLET BY MOUTH AT BEDTIME AS NEEDED FOR MUSCLE SPASM OR  LEG  CRAMPS 06/10/18  Yes [place], Modena Nunnery, MD  losartan (COZAAR) 50 MG tablet TAKE 1/2 (ONE-HALF) TABLET BY MOUTH ONCE DAILY 06/10/18  Yes Lorretta Harp, MD  nitroGLYCERIN (NITROSTAT) 0.4 MG SL tablet DISSOLVE ONE TABLET UNDER THE TONGUE AS NEEDED FOR CHEST PAIN**MAX 3 TABLETS EACH 5 MINUTES APART** 09/14/16  Yes [place], Modena Nunnery, MD  ranitidine (ZANTAC) 150 MG capsule Take 1 capsule (150 mg total) by mouth daily with breakfast. 07/24/18  Yes Susy Frizzle, MD  albuterol (PROVENTIL HFA;VENTOLIN HFA) 108 (90 Base) MCG/ACT inhaler Inhale into the lungs every 6 (six) hours as needed for wheezing or shortness of breath.    [provider]  aspirin 81 MG tablet Take 81 mg by mouth every evening.     [provider]  loratadine (CLARITIN) 10  MG tablet Take 10 mg by mouth daily.    [provider]  Magnesium 250 MG TABS Take 250 mg by mouth 2 (two) times daily.    [provider]  Multiple Vitamin (MULTIVITAMIN) capsule Take 1 capsule by mouth daily.    [provider]  Omega-3 Krill Oil 500 MG CAPS Take 1 capsule by mouth daily.    [provider]  omeprazole (PRILOSEC) 20 MG capsule Take 1 po BID x 2 weeks then once a day Patient not taking: Reported on 12/22/2018 12/09/18   Rolland Porter, MD  rosuvastatin (CRESTOR) 20 MG tablet Take 1 tablet (20 mg total) by mouth daily. 07/30/18   Susy Frizzle, MD  traMADol (ULTRAM) 50 MG tablet Take 1 tablet (50 mg total) by mouth every 8 (eight) hours as needed. 12/16/17   [name], MD    Physical Exam: Vitals:   12/22/18 1500 12/22/18 1530 12/22/18 1630 12/22/18 1844  BP: (!) 158/80 (!) 155/81 (!) 158/78 (!) 161/77  Pulse: 73 71 70 69  Resp: 11 15 11 18   Temp:      TempSrc:  SpO2: 100% 100% 100% 100%  Weight:      Height:        Constitutional:  . Appears calm and comfortable Eyes:  . No pallor. No jaundice.  ENMT:  . external ears, nose appear normal Neck:  . Neck is supple. No JVD Respiratory:  . CTA bilaterally, no w/r/r.  . Respiratory effort normal. No retractions or accessory muscle use Cardiovascular:  . S1S2 . No LE extremity edema   Abdomen:  . Abdomen is soft, with vague epigastric and left-sided abdominal pain on palpation.  Organs are difficult to assess. Neurologic:  . Awake and alert. . Moves all limbs.  Wt Readings from Last 3 Encounters:  12/22/18 84 kg  12/19/18 84.4 kg  12/09/18 86.2 kg    I have personally reviewed following labs and imaging studies  Labs on Admission:  CBC: Recent Labs  Lab 12/22/18 0145 12/22/18 0251  WBC SPECIMEN CLOTTED PRUITT,G @ 0157 ON 12/22/18 BY JUW 15.3*  NEUTROABS PENDING 12.4*  HGB SPECIMEN CLOTTED PRUITT,G @ 0157 ON 12/22/18 BY JUW 12.8  HCT SPECIMEN CLOTTED  PRUITT,G @ 0157 ON 12/22/18 BY JUW 41.7  MCV SPECIMEN CLOTTED PRUITT,G @ 0157 ON 12/22/18 BY JUW 98.8  PLT SPECIMEN CLOTTED PRUITT,G @ 0157 ON 12/22/18 BY JUW 697   Basic Metabolic Panel: Recent Labs  Lab 12/22/18 0145  NA 140  K 3.5  CL 108  CO2 22  GLUCOSE 90  BUN 14  CREATININE 0.80  CALCIUM 8.9  MG 2.2   Liver Function Tests: Recent Labs  Lab 12/22/18 0145  AST 37  ALT 18  ALKPHOS 78  BILITOT 0.3  PROT 7.8  ALBUMIN 4.0   Recent Labs  Lab 12/22/18 0145  LIPASE 48   No results for input(s): AMMONIA in the last 168 hours. Coagulation Profile: No results for input(s): INR, PROTIME in the last 168 hours. Cardiac Enzymes: No results for input(s): CKTOTAL, CKMB, CKMBINDEX, TROPONINI in the last 168 hours. BNP (last 3 results) No results for input(s): PROBNP in the last 8760 hours. HbA1C: No results for input(s): HGBA1C in the last 72 hours. CBG: No results for input(s): GLUCAP in the last 168 hours. Lipid Profile: No results for input(s): CHOL, HDL, LDLCALC, TRIG, CHOLHDL, LDLDIRECT in the last 72 hours. Thyroid Function Tests: No results for input(s): TSH, T4TOTAL, FREET4, T3FREE, THYROIDAB in the last 72 hours. Anemia Panel: No results for input(s): VITAMINB12, FOLATE, FERRITIN, TIBC, IRON, RETICCTPCT in the last 72 hours. Urine analysis:    Component Value Date/Time   COLORURINE YELLOW 01/18/2018 1023   APPEARANCEUR CLEAR 01/18/2018 1023   LABSPEC 1.014 01/18/2018 1023   PHURINE 5.0 01/18/2018 1023   GLUCOSEU NEGATIVE 01/18/2018 1023   HGBUR SMALL (A) 01/18/2018 1023   BILIRUBINUR NEGATIVE 01/18/2018 1023   KETONESUR NEGATIVE 01/18/2018 1023   PROTEINUR NEGATIVE 01/18/2018 1023   UROBILINOGEN 0.2 11/30/2013 0811   NITRITE NEGATIVE 01/18/2018 1023   LEUKOCYTESUR NEGATIVE 01/18/2018 1023   Sepsis Labs: @LABRCNTIP (procalcitonin:4,lacticidven:4) )No results found for this or any previous visit (from the past 240 hour(s)).    Radiological Exams on  Admission: Vas [place] Mesenteric  Result Date: 12/22/2018 ABDOMINAL VISCERAL Indications: abdominal pain Comparison Study: CT angio abdomen and pelvis on 12/22/18 showed variant anatomy                   with celiac artery and SMA with no flow limiting stenosis. The  spenic and common hepatic arteries are replaced to the SMA. Performing Technologist: Oda Cogan RDMS, RVT  Examination Guidelines: A complete evaluation includes B-mode imaging, spectral Doppler, color Doppler, and power Doppler as needed of all accessible portions of each vessel. Bilateral testing is considered an integral part of a complete examination. Limited examinations for reoccurring indications may be performed as noted.  Duplex Findings: +--------------------+--------+--------+------+---------------------+ Mesenteric          PSV cm/sEDV cm/sPlaque      Comments        +--------------------+--------+--------+------+---------------------+ Aorta at SMA           65                                       +--------------------+--------+--------+------+---------------------+ Celiac Artery Origin  268                                       +--------------------+--------+--------+------+---------------------+ SMA Origin                                Celiomesenteric trunk +--------------------+--------+--------+------+---------------------+ SMA Proximal          289                                       +--------------------+--------+--------+------+---------------------+ SMA Mid               143                                       +--------------------+--------+--------+------+---------------------+ SMA Distal             90                                       +--------------------+--------+--------+------+---------------------+ CHA                    84                                       +--------------------+--------+--------+------+---------------------+ Splenic                110                                       +--------------------+--------+--------+------+---------------------+  Summary: Mesenteric:  Patient has a known celiomesenteric trunk with mild atheromatous narrowing at the origin.  Patent celiac and SMA with no hemodynamically significant stenosis noted. IMA was not clearly visualized.  *See table(s) above for measurements and observations.     Preliminary    Ct Angio Abd/pel W And/or Wo Contrast  Result Date: 12/22/2018 CLINICAL DATA:  Sudden severe abdominal pain with diarrhea. Known mesenteric anomaly. EXAM: CTA ABDOMEN AND PELVIS wITHOUT AND WITH CONTRAST TECHNIQUE: Multidetector CT imaging of the abdomen and pelvis was performed using the standard protocol during  bolus administration of intravenous contrast. Multiplanar reconstructed images and MIPs were obtained and reviewed to evaluate the vascular anatomy. CONTRAST:  178mL ISOVUE-370 IOPAMIDOL (ISOVUE-370) INJECTION 76% COMPARISON:  09/19/2019 FINDINGS: VASCULAR Aorta: Atheromatous plaque. No aneurysm, dissection, or inflammatory changes. Celiac: Variant anatomy with left gastric and phrenic arteries arising at the orthotopic celiac position, with narrowing from atherosclerosis and/or median arcuate ligament impression, quantification limited by small vessel size SMA: The splenic and common hepatic arteries are replaced to the SMA. Plaque at the origin without flow limiting stenosis. No branch occlusion. Renals: Plaque at both ostia without flow limiting stenosis. No beading or aneurysm seen IMA: Patent Inflow: Bulky partially calcified plaque projecting into the origin of the right common iliac artery with high-grade narrowing on reformats. No aneurysm or dissection. Proximal Outflow: Atherosclerosis without stenosis or occlusion. Veins: Negative.  The left colic vein and IMV are patent. Review of the MIP images confirms the above findings. NON-VASCULAR Lower chest:  No contributory findings.  Hepatobiliary: No focal liver abnormality.No evidence of biliary obstruction or stone. Pancreas: Unremarkable. Spleen: Unremarkable. Adrenals/Urinary Tract: Negative adrenals. No hydronephrosis or stone. Mild patchy right renal cortical scarring. Unremarkable bladder. Stomach/Bowel: Left-sided colonic wall thickening and mild fat haziness with normalization by the rectum. No appendicitis. Lymphatic:    No mass or adenopathy. Reproductive:Partially calcified fibroid measuring 3.5 cm. Other: No ascites or pneumoperitoneum. Musculoskeletal: No acute abnormalities. Lumbar facet arthropathy with grade 1 anterolisthesis at L4-5. IMPRESSION: VASCULAR 1. No emergent finding. 2. Celiomesenteric trunk with mild atheromatous narrowing at the origin. 3. High-grade atheromatous narrowing at the origin of the right common iliac artery. NON-VASCULAR Left-sided colitis which could be infectious or from nonocclusive ischemia. Electronically Signed   By: Monte Fantasia M.D.   On: 12/22/2018 04:53    EKG: Independently reviewed.   Active Problems:   Abdominal pain   Colitis   Assessment/Plan Abdominal pain, likely secondary to colitis: -Possibly infectious colitis. -Mild leukocytosis is noted. -We will start patient on IV antibiotics. -Continue IV fluid. -Supportive care. -Further management will depend on hospital course.  Uncontrolled hypertension: -Continue to optimize.  Abdominal pain/tenderness: Improved significantly.  Further management will depend on hospital course.  DVT prophylaxis: Subcut Lovenox Code Status: Full code Family Communication: Daughter Disposition Plan: Home Consults called: Patient has been seen by the vascular surgery team Admission status: Observation  Time spent: 65 minutes.   Dana Allan, MD  [facility] Pager #: [phone] 7PM-7AM contact night coverage as above  12/22/2018, 7:49 PM

## 2018-12-22 NOTE — ED Triage Notes (Signed)
Unable to collect blood sample due to difficult stick

## 2018-12-22 NOTE — Progress Notes (Signed)
Mesenteric study completed.  Refer to [facility] under chart review to view preliminary results. Results given to Dr. Oneida Alar.   12/22/2018  11:42 AM Ellia Knowlton, Bonnye Fava

## 2018-12-22 NOTE — ED Notes (Signed)
Unable to obtain IV access at this time. Dr. Kathrynn Humble aware and attempting U/S guided IV.

## 2018-12-22 NOTE — ED Provider Notes (Signed)
[facility] EMERGENCY DEPARTMENT Provider Note   CSN: [id number] Arrival date & time: 12/22/18  0017     History   Chief Complaint Chief Complaint  Patient presents with  . Abdominal Pain    HPI Lindsey Mullins is a 73 y.o. female.  HPI  73 year old female with history of coronary artery disease, cerebral atherosclerosis comes in with chief complaint of abdominal pain.  Patient states that she has been having epigastric abdominal pain for the last few days.  The pain has been intermittent.  She saw her PCP and she had a CT abdomen without contrast completed on Friday.  She reports that in the middle the night she woke up with severe pain of her abdomen.  In route to the ER she started having loose bowel movements.  Patient's pain is generalized and described as sharp and stabbing.  She denies any associated nausea.  Patient has no history of similar pain in the past.  Past Medical History:  Diagnosis Date  . Atypical chest pain 05/23/2012   STRESS TEST - small to moderate sized area of partial reversibility of the anteroapical wall, most likely breast artifact, post-stress EF 67%, EKG show NSR at 65, No Lexiscan EKG changes, non-diagnostic for ischemia; STRESS TEST, 01/25/2010 - normal study, post-stress EF 66%, no significant ischemia  . Cerebral atherosclerosis 09/29/2012   CAROTID DUPLEX - RIGHT  BULB/PROXIMAL ICA-moderate amount of fibrous plaque, 50-69% diameter reduction; LEFT CEA-normal, no significant diameter reduction  . GERD (gastroesophageal reflux disease)   . Hyperlipidemia   . Hypertension   . Osteopenia   . Restless leg syndrome   . Shortness of breath 03/30/2005   2D ECHO - EF >55%, normal  . TIA (transient ischemic attack) 01/27/2010   2D ECHO - EF 65%, normal    Patient Active Problem List   Diagnosis Date Noted  . Abdominal pain 12/22/2018  . Osteopenia   . DDD (degenerative disc disease), lumbar 05/11/2016  . Obesity 05/11/2016  . Glucose intolerance  (impaired glucose tolerance) 01/12/2016  . Pain in the chest   . Chest pain 01/10/2016  . GERD (gastroesophageal reflux disease) 01/26/2015  . Stress and adjustment reaction 01/26/2015  . Dysuria 11/30/2013  . Carotid artery disease ([place]) 04/20/2013  . Bunion, left 09/21/2012  . Overweight(278.02) 06/04/2012  . Back pain 04/18/2012  . Essential hypertension, benign 04/02/2012  . Hyperlipidemia 04/02/2012  . Dysphagia 04/02/2012  . RLS (restless legs syndrome) 04/02/2012    Past Surgical History:  Procedure Laterality Date  . ABDOMINAL EXPLORATION SURGERY    . CAROTID ENDARTERECTOMY    . Peripheral Vascular Catheterization  07/16/2005   Right verterbral-50% smooth segmental badsilar artery stenosis on right side, Right carotid-50% ulcerated proxmial stenosis, Left carotid-60 to 70% left externam carotid artery stenosis, 90% proximal left ICA stenosis     OB History    Gravida  2   Para  2   Term  2   Preterm      AB      Living        SAB      TAB      Ectopic      Multiple      Live Births               Home Medications    Prior to Admission medications   Medication Sig Start Date End Date Taking? Authorizing Provider  albuterol (PROVENTIL HFA;VENTOLIN HFA) 108 (90 Base) MCG/ACT inhaler Inhale into the lungs  every 6 (six) hours as needed for wheezing or shortness of breath.    [provider]  aspirin 81 MG tablet Take 81 mg by mouth every evening.     [provider]  carvedilol (COREG) 3.125 MG tablet TAKE 1 TABLET BY MOUTH ONCE DAILY 08/25/18   [place], Modena Nunnery, MD  cyclobenzaprine (FLEXERIL) 5 MG tablet TAKE 1 TABLET BY MOUTH AT BEDTIME AS NEEDED FOR MUSCLE SPASM OR  LEG  CRAMPS 06/10/18   [name], MD  loratadine (CLARITIN) 10 MG tablet Take 10 mg by mouth daily.    [provider]  losartan (COZAAR) 50 MG tablet TAKE 1/2 (ONE-HALF) TABLET BY MOUTH ONCE DAILY 06/10/18   Lorretta Harp, MD  Magnesium 250 MG TABS  Take 250 mg by mouth 2 (two) times daily.    [provider]  Multiple Vitamin (MULTIVITAMIN) capsule Take 1 capsule by mouth daily.    [provider]  nitroGLYCERIN (NITROSTAT) 0.4 MG SL tablet DISSOLVE ONE TABLET UNDER THE TONGUE AS NEEDED FOR CHEST PAIN**MAX 3 TABLETS EACH 5 MINUTES APART** 09/14/16   [place], Modena Nunnery, MD  Omega-3 Krill Oil 500 MG CAPS Take 1 capsule by mouth daily.    [provider]  omeprazole (PRILOSEC) 20 MG capsule Take 1 po BID x 2 weeks then once a day 12/09/18   Rolland Porter, MD  ranitidine (ZANTAC) 150 MG capsule Take 1 capsule (150 mg total) by mouth daily with breakfast. 07/24/18   Susy Frizzle, MD  rosuvastatin (CRESTOR) 20 MG tablet Take 1 tablet (20 mg total) by mouth daily. 07/30/18   Susy Frizzle, MD  traMADol (ULTRAM) 50 MG tablet Take 1 tablet (50 mg total) by mouth every 8 (eight) hours as needed. 12/16/17   [name], MD    Family History Family History  Problem Relation Age of Onset  . Heart disease Mother   . Hypertension Mother   . Hypertension Father   . Heart disease Father     Social History Social History   Tobacco Use  . Smoking status: Former Smoker    Last attempt to quit: 04/21/1983    Years since quitting: 35.6  . Smokeless tobacco: Never Used  Substance Use Topics  . Alcohol use: No  . Drug use: No     Allergies   Codeine; Lipitor [atorvastatin]; Lisinopril; Penicillins; and Pravastatin   Review of Systems Review of Systems  Constitutional: Positive for activity change.  Gastrointestinal: Positive for abdominal pain and diarrhea.  All other systems reviewed and are negative.    Physical Exam Updated Vital Signs BP 139/71   Pulse 64   Temp 97.9 F (36.6 C) (Oral)   Resp 18   Ht 5\' 6"  (1.676 m)   Wt 84 kg   SpO2 100%   BMI 29.89 kg/m   Physical Exam Vitals signs and nursing note reviewed.  Constitutional:      Appearance: She is well-developed.  HENT:     Head:  Normocephalic and atraumatic.  Neck:     Musculoskeletal: Normal range of motion and neck supple.  Cardiovascular:     Rate and Rhythm: Normal rate.  Pulmonary:     Effort: Pulmonary effort is normal.  Abdominal:     General: Bowel sounds are normal.     Tenderness: There is generalized abdominal tenderness and tenderness in the epigastric area. There is guarding. There is no rebound.  Skin:    General: Skin is warm and  dry.  Neurological:     Mental Status: She is alert and oriented to person, place, and time.      ED Treatments / Results  Labs (all labs ordered are listed, but only abnormal results are displayed) Labs Reviewed  CBC WITH DIFFERENTIAL/PLATELET - Abnormal; Notable for the following components:      Result Value   WBC 15.3 (*)    Neutro Abs 12.4 (*)    All other components within normal limits  COMPREHENSIVE METABOLIC PANEL  CBC WITH DIFFERENTIAL/PLATELET  LIPASE, BLOOD  LACTIC ACID, PLASMA  LACTIC ACID, PLASMA  MAGNESIUM    EKG None  Radiology Ct Angio Abd/pel W And/or Wo Contrast  Result Date: 12/22/2018 CLINICAL DATA:  Sudden severe abdominal pain with diarrhea. Known mesenteric anomaly. EXAM: CTA ABDOMEN AND PELVIS wITHOUT AND WITH CONTRAST TECHNIQUE: Multidetector CT imaging of the abdomen and pelvis was performed using the standard protocol during bolus administration of intravenous contrast. Multiplanar reconstructed images and MIPs were obtained and reviewed to evaluate the vascular anatomy. CONTRAST:  176mL ISOVUE-370 IOPAMIDOL (ISOVUE-370) INJECTION 76% COMPARISON:  09/19/2019 FINDINGS: VASCULAR Aorta: Atheromatous plaque. No aneurysm, dissection, or inflammatory changes. Celiac: Variant anatomy with left gastric and phrenic arteries arising at the orthotopic celiac position, with narrowing from atherosclerosis and/or median arcuate ligament impression, quantification limited by small vessel size SMA: The splenic and common hepatic arteries are  replaced to the SMA. Plaque at the origin without flow limiting stenosis. No branch occlusion. Renals: Plaque at both ostia without flow limiting stenosis. No beading or aneurysm seen IMA: Patent Inflow: Bulky partially calcified plaque projecting into the origin of the right common iliac artery with high-grade narrowing on reformats. No aneurysm or dissection. Proximal Outflow: Atherosclerosis without stenosis or occlusion. Veins: Negative.  The left colic vein and IMV are patent. Review of the MIP images confirms the above findings. NON-VASCULAR Lower chest:  No contributory findings. Hepatobiliary: No focal liver abnormality.No evidence of biliary obstruction or stone. Pancreas: Unremarkable. Spleen: Unremarkable. Adrenals/Urinary Tract: Negative adrenals. No hydronephrosis or stone. Mild patchy right renal cortical scarring. Unremarkable bladder. Stomach/Bowel: Left-sided colonic wall thickening and mild fat haziness with normalization by the rectum. No appendicitis. Lymphatic:    No mass or adenopathy. Reproductive:Partially calcified fibroid measuring 3.5 cm. Other: No ascites or pneumoperitoneum. Musculoskeletal: No acute abnormalities. Lumbar facet arthropathy with grade 1 anterolisthesis at L4-5. IMPRESSION: VASCULAR 1. No emergent finding. 2. Celiomesenteric trunk with mild atheromatous narrowing at the origin. 3. High-grade atheromatous narrowing at the origin of the right common iliac artery. NON-VASCULAR Left-sided colitis which could be infectious or from nonocclusive ischemia. Electronically Signed   By: Monte Fantasia M.D.   On: 12/22/2018 04:53    Procedures .Critical Care Performed by: Varney Biles, MD Authorized by: Varney Biles, MD   Critical care provider statement:    Critical care time (minutes):  45   Critical care time was exclusive of:  Separately billable procedures and treating other patients   Critical care was necessary to treat or prevent imminent or life-threatening  deterioration of the following conditions:  Circulatory failure   Critical care was time spent personally by me on the following activities:  Discussions with consultants, evaluation of patient's response to treatment, examination of patient, ordering and performing treatments and interventions, ordering and review of laboratory studies, ordering and review of radiographic studies, pulse oximetry, re-evaluation of patient's condition, obtaining history from patient or surrogate and review of old charts Angiocath insertion Date/Time: 12/22/2018 7:45 AM Performed by: Kathrynn Humble,  Thelma Comp, MD Authorized by: Varney Biles, MD  Consent: Verbal consent obtained. Consent given by: patient Patient understanding: patient states understanding of the procedure being performed Patient identity confirmed: arm band Preparation: Patient was prepped and draped in the usual sterile fashion. Local anesthesia used: no  Anesthesia: Local anesthesia used: no  Sedation: Patient sedated: no     (including critical care time)  Medications Ordered in ED Medications  morphine 4 MG/ML injection 6 mg (has no administration in time range)  0.9 % NaCl with KCl 20 mEq/ L  infusion ( Intravenous New Bag/Given 12/22/18 2633)  lactated ringers bolus 1,000 mL ( Intravenous Stopped 12/22/18 0411)  morphine 2 MG/ML injection (6 mg Intravenous Given 12/22/18 0341)  iopamidol (ISOVUE-370) 76 % injection 100 mL (100 mLs Intravenous Contrast Given 12/22/18 0414)     Initial Impression / Assessment and Plan / ED Course  I have reviewed the triage vital signs and the nursing notes.  Pertinent labs & imaging results that were available during my care of the patient were reviewed by me and considered in my medical decision making (see chart for details).     73 year old female comes in a chief complaint of severe abdominal pain.  She has known history of coronary artery disease, cerebral vascular atherosclerosis.  She has been having  this nonspecific abdominal pain for the last few days and had a recent CT abdomen and pelvis without contrast that revealed some anatomical variance over her celiac artery.  She comes to the ER tonight with severe pain that woke her up in the middle the night.  She is having generalized tenderness with guarding.  Patient had some loose bowel movements in the ER that were nonbloody.  Her pain is out of proportion to the exam, and there is clinical concerns for possible mesenteric ischemia.  Differential diagnosis may also include of pancreatitis, cholelithiasis, gastritis.  CT angiogram was ordered.  CT angiogram does reveal critical narrowing due to atherosclerosis of both the celiac and the SMA.  I spoke with Dr. Oneida Alar, vascular surgery.  He would like the patient to be transferred to emergency room at [facility] so that he can further assess her.  Dr. Melina Copa, at [facility], ER excepting the patient.  If Dr. Oneida Alar does not think that patient is having findings consistent with early mesenteric ischemia then patient will need reassessment for admission for pain control if she still having pain.  Results of the ER work-up discussed with the patient.  We also discussed the recommendation that came from Dr. fields and they are happy with the plan.  Patient is still having abdominal pain, but it is tolerable and she does not want any medications at this time.  Final Clinical Impressions(s) / ED Diagnoses   Final diagnoses:  Celiac artery atherosclerosis  Generalized abdominal pain    ED Discharge Orders    None       Varney Biles, MD 12/22/18 [phone]

## 2018-12-22 NOTE — ED Triage Notes (Signed)
Pt c/o abd pain with diarrhea that started tonight, pt had large bowel movement upon arrival to er. Pt cleaned and changed into gown,

## 2018-12-22 NOTE — ED Notes (Signed)
[facility] here to transport pt 

## 2018-12-23 DIAGNOSIS — G2581 Restless legs syndrome: Secondary | ICD-10-CM | POA: Diagnosis present

## 2018-12-23 DIAGNOSIS — I708 Atherosclerosis of other arteries: Secondary | ICD-10-CM

## 2018-12-23 DIAGNOSIS — E785 Hyperlipidemia, unspecified: Secondary | ICD-10-CM | POA: Diagnosis present

## 2018-12-23 DIAGNOSIS — K219 Gastro-esophageal reflux disease without esophagitis: Secondary | ICD-10-CM | POA: Diagnosis present

## 2018-12-23 DIAGNOSIS — Z7982 Long term (current) use of aspirin: Secondary | ICD-10-CM | POA: Diagnosis not present

## 2018-12-23 DIAGNOSIS — K529 Noninfective gastroenteritis and colitis, unspecified: Secondary | ICD-10-CM | POA: Diagnosis not present

## 2018-12-23 DIAGNOSIS — R1032 Left lower quadrant pain: Secondary | ICD-10-CM | POA: Diagnosis not present

## 2018-12-23 DIAGNOSIS — I2510 Atherosclerotic heart disease of native coronary artery without angina pectoris: Secondary | ICD-10-CM | POA: Diagnosis present

## 2018-12-23 DIAGNOSIS — I10 Essential (primary) hypertension: Secondary | ICD-10-CM | POA: Diagnosis present

## 2018-12-23 DIAGNOSIS — I774 Celiac artery compression syndrome: Secondary | ICD-10-CM | POA: Diagnosis present

## 2018-12-23 DIAGNOSIS — Z8673 Personal history of transient ischemic attack (TIA), and cerebral infarction without residual deficits: Secondary | ICD-10-CM | POA: Diagnosis not present

## 2018-12-23 DIAGNOSIS — R1084 Generalized abdominal pain: Secondary | ICD-10-CM

## 2018-12-23 DIAGNOSIS — R112 Nausea with vomiting, unspecified: Secondary | ICD-10-CM | POA: Diagnosis present

## 2018-12-23 DIAGNOSIS — Z79899 Other long term (current) drug therapy: Secondary | ICD-10-CM | POA: Diagnosis not present

## 2018-12-23 DIAGNOSIS — Z87891 Personal history of nicotine dependence: Secondary | ICD-10-CM | POA: Diagnosis not present

## 2018-12-23 LAB — BASIC METABOLIC PANEL
Anion gap: 11 (ref 5–15)
BUN: 7 mg/dL — ABNORMAL LOW (ref 8–23)
CO2: 22 mmol/L (ref 22–32)
Calcium: 8.6 mg/dL — ABNORMAL LOW (ref 8.9–10.3)
Chloride: 109 mmol/L (ref 98–111)
Creatinine, Ser: 0.89 mg/dL (ref 0.44–1.00)
GFR calc Af Amer: >60 mL/min (ref >60)
GFR calc non Af Amer: >60 mL/min (ref >60)
Glucose, Bld: 93 mg/dL (ref 70–99)
Potassium: 3.6 mmol/L (ref 3.5–5.1)
Sodium: 142 mmol/L (ref 135–145)

## 2018-12-23 LAB — CBC
HCT: 31.9 % — ABNORMAL LOW (ref 36.0–46.0)
Hemoglobin: 10 g/dL — ABNORMAL LOW (ref 12.0–15.0)
MCH: 28.7 pg (ref 26.0–34.0)
MCHC: 31.3 g/dL (ref 30.0–36.0)
MCV: 91.7 fL (ref 80.0–100.0)
Platelets: 316 10*3/uL (ref 150–400)
RBC: 3.48 MIL/uL — ABNORMAL LOW (ref 3.87–5.11)
RDW: 13.4 % (ref 11.5–15.5)
WBC: 10.1 10*3/uL (ref 4.0–10.5)
nRBC: 0 % (ref 0.0–0.2)

## 2018-12-23 LAB — PHOSPHORUS: Phosphorus: 3.1 mg/dL (ref 2.5–4.6)

## 2018-12-23 LAB — MAGNESIUM: Magnesium: 1.9 mg/dL (ref 1.7–2.4)

## 2018-12-23 MED ORDER — POTASSIUM CHLORIDE IN NACL 20-0.9 MEQ/L-% IV SOLN
INTRAVENOUS | Status: AC
  Administered 2018-12-23: 11:00:00 via INTRAVENOUS
  Filled 2018-12-23 (×2): qty 1000

## 2018-12-23 NOTE — Evaluation (Signed)
Physical Therapy Evaluation Patient Details Name: Lindsey Mullins MRN: [id number] DOB: [birth year] Today's Date: 12/23/2018   History of Present Illness  Patient is a 73 y/o female presenting to the ED on 12/22/2018 with primary complaints of abedominal pain. Past medical history significant for TIA, restless leg syndrome, hypertension, hyperlipidemia, GERD, cerebral atherosclerosis and atypical chest pain. CT scan of the abdomen and pelvis with and without contrast revealed left-sided colitis, mild atheromatous narrowing at the origin of the celiomesenteric trunk and high-grade atheromatous narrowing at the origin of the right common iliac artery.     Clinical Impression  Patient admitted with the above listed diagnosis. Prior to admission patient IND with all mobility and ADLs. Patient today requiring Min guard/SUP for mobility only for safety. No physical assist provided in session. Patient feels as if she is at baseline with functional mobility. No further acute PT needs identified. PT to sign off. Thanks for the referral.      Follow Up Recommendations No PT follow up    Equipment Recommendations  None recommended by PT    Recommendations for Other Services       Precautions / Restrictions Precautions Precautions: Fall Restrictions Weight Bearing Restrictions: No      Mobility  Bed Mobility Overal bed mobility: Modified Independent                Transfers Overall transfer level: Needs assistance Equipment used: None Transfers: Sit to/from Stand Sit to Stand: Supervision         General transfer comment: for safety and immediate standing balance  Ambulation/Gait Ambulation/Gait assistance: Min guard;Supervision Gait Distance (Feet): 200 Feet Assistive device: None Gait Pattern/deviations: Step-through pattern;Decreased stride length Gait velocity: decreased   General Gait Details: slow steady pace of gait; no AD; no LOB  Stairs            Wheelchair  Mobility    Modified Rankin (Stroke Patients Only)       Balance Overall balance assessment: No apparent balance deficits (not formally assessed)                                           Pertinent Vitals/Pain Pain Assessment: No/denies pain    Home Living Family/patient expects to be discharged to:: Private residence Living Arrangements: Non-relatives/Friends Available Help at Discharge: Friend(s);Available 24 hours/day Type of Home: House Home Access: Level entry     Home Layout: One level Home Equipment: None      Prior Function Level of Independence: Independent         Comments: 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     Hand Dominance        Extremity/Trunk Assessment   Upper Extremity Assessment Upper Extremity Assessment: Overall WFL for tasks assessed    Lower Extremity Assessment Lower Extremity Assessment: Overall WFL for tasks assessed    Cervical / Trunk Assessment Cervical / Trunk Assessment: Normal  Communication   Communication: No difficulties  Cognition Arousal/Alertness: Awake/alert Behavior During Therapy: WFL for tasks assessed/performed Overall Cognitive Status: Within Functional Limits for tasks assessed                                        General Comments      Exercises     Assessment/Plan    PT  Assessment Patent does not need any further PT services  PT Problem List         PT Treatment Interventions      PT Goals (Current goals can be found in the Care Plan section)  Acute Rehab PT Goals Patient Stated Goal: return home tomorrow PT Goal Formulation: With patient Time For Goal Achievement: 12/30/18 Potential to Achieve Goals: Good    Frequency     Barriers to discharge        Co-evaluation               AM-PAC PT "6 Clicks" Mobility  Outcome Measure Help needed turning from your back to your side while in a flat bed without using bedrails?: None Help needed  moving from lying on your back to sitting on the side of a flat bed without using bedrails?: None Help needed moving to and from a bed to a chair (including a wheelchair)?: A Little Help needed standing up from a chair using your arms (e.g., wheelchair or bedside chair)?: A Little Help needed to walk in hospital room?: A Little Help needed climbing 3-5 steps with a railing? : A Little 6 Click Score: 20    End of Session Equipment Utilized During Treatment: Gait belt Activity Tolerance: Patient tolerated treatment well Patient left: in bed;with call bell/phone within reach;with family/visitor present Nurse Communication: Mobility status PT Visit Diagnosis: Unsteadiness on feet (R26.81);Other abnormalities of gait and mobility (R26.89);Muscle weakness (generalized) (M62.81)    Time: 1100-1115 PT Time Calculation (min) (ACUTE ONLY): 15 min   Charges:   PT Evaluation $PT Eval Moderate Complexity: 1 Mod           Lanney Gins, PT, DPT Supplemental Physical Therapist 12/23/18 11:48 AM Pager: [phone] Office: [phone]

## 2018-12-23 NOTE — Progress Notes (Signed)
PROGRESS NOTE                                                                                                                                                                                                             Patient Demographics:    Lindsey Mullins, is a 73 y.o. female, DOB - 01/08/1946, YQM:[id number]  Admit date - 12/22/2018   Admitting Physician Bonnell Public, MD  Outpatient Primary MD for the patient is [facility], Modena Nunnery, MD  LOS - 0  Chief Complaint  Patient presents with  . Abdominal Pain       Brief Narrative  Patient is a 73 year old African-American female past medical history significant for TIA, restless leg syndrome, hypertension, hyperlipidemia, GERD, cerebral atherosclerosis and atypical chest pain, presented to any pain hospital with excruciating abdominal pain sudden onset with watery diarrhea.  There was suspicion of mesenteric ischemia and she was sent to [facility] for further evaluation.   Subjective:    [name] today has, No headache, No chest pain, No abdominal pain - No Nausea, No new weakness tingling or numbness, No Cough - SOB.     Assessment  & Plan :      1.  Abdominal pain and colitis.  Could be gastroenteritis.  Suspicion for mesenteric ischemia, seen by vascular surgeon Dr. early, CT angiogram of the abdomen and vascular ultrasound both do not confirm any acute vascular insult.  Could have had gastroenteritis, continue Flagyl, advance diet and monitor.  Currently exam is benign.  If stable discharge tomorrow with outpatient GI follow-up.  2. CAD.  Continue combination of aspirin, Coreg and statin for secondary prevention no acute issues.  3.  Hypertension.  Continue combination of beta-blocker and ACE inhibitor.  Stable.   Family Communication  :  daughter  Code Status :  Full  Disposition Plan  :  Home in am  Consults  :  VVS  Procedures  :    CT - No acute CT finding. The patient has variant mesenteric  arterial anatomy, with a celiacomesenteric trunk. There are dense calcifications at the origin of the artery. If the patient has symptoms of chronic mesenteric ischemia, mesenteric duplex may be considered for further evaluation, or alternatively CT angiogram. Aortic Atherosclerosis (ICD10-I70.0)  CT angio - IMPRESSION: VASCULAR 1. No emergent finding. 2. Celiomesenteric trunk with mild atheromatous narrowing at the origin. 3. High-grade atheromatous narrowing at the origin of the right common iliac artery. NON-VASCULAR Left-sided colitis which could be infectious  or from nonocclusive ischemia.  Abd Art [place] -  Mesenteric:  Patient has a known celiomesenteric trunk with mild atheromatous narrowing at the origin.  Patent celiac and SMA with no hemodynamically significant stenosis noted. IMA was not clearly visualized.  *See table(s) above for measurements and observations.  DVT Prophylaxis  :  Lovenox   Lab Results  Component Value Date   PLT 316 12/23/2018    Diet :  Diet Order            DIET SOFT Room service appropriate? Yes; Fluid consistency: Thin  Diet effective now               Inpatient Medications Scheduled Meds: . aspirin EC  81 mg Oral QPM  . carvedilol  3.125 mg Oral Daily  . enoxaparin (LOVENOX) injection  40 mg Subcutaneous Q24H  . loratadine  10 mg Oral Daily  . losartan  25 mg Oral Daily  . multivitamin with minerals  1 tablet Oral Daily  . pantoprazole  40 mg Oral Daily  . rosuvastatin  20 mg Oral Daily   Continuous Infusions: . 0.9 % NaCl with KCl 20 mEq / L 50 mL/hr at 12/23/18 1045  . metronidazole 500 mg (12/23/18 1139)   PRN Meds:.acetaminophen  Antibiotics  :   Anti-infectives (From admission, onward)   Start     Dose/Rate Route Frequency Ordered Stop   12/23/18 1230  levofloxacin (LEVAQUIN) IVPB 750 mg  Status:  Discontinued     750 mg 100 mL/hr over 90 Minutes Intravenous Every 24 hours 12/22/18 1656 12/23/18 1154   12/22/18 2000  metroNIDAZOLE  (FLAGYL) IVPB 500 mg     500 mg 100 mL/hr over 60 Minutes Intravenous Every 8 hours 12/22/18 1656     12/22/18 1230  levofloxacin (LEVAQUIN) IVPB 750 mg     750 mg 100 mL/hr over 90 Minutes Intravenous  Once 12/22/18 1227 12/22/18 1516   12/22/18 1230  metroNIDAZOLE (FLAGYL) IVPB 500 mg     500 mg 100 mL/hr over 60 Minutes Intravenous  Once 12/22/18 1227 12/22/18 1400          Objective:   Vitals:   12/22/18 1844 12/22/18 2114 12/22/18 2119 12/23/18 0518  BP: (!) 161/77 (!) 181/65 (!) 181/65 (!) 142/83  Pulse: 69 69 69 66  Resp: 18   18  Temp:    98.2 F (36.8 C)  TempSrc:    Oral  SpO2: 100%   98%  Weight:      Height:        Wt Readings from Last 3 Encounters:  12/22/18 84 kg  12/19/18 84.4 kg  12/09/18 86.2 kg     Intake/Output Summary (Last 24 hours) at 12/23/2018 1154 Last data filed at 12/23/2018 0306 Gross per 24 hour  Intake 1733.12 ml  Output -  Net 1733.12 ml     Physical Exam  Awake Alert, Oriented X 3, No new F.N deficits, Normal affect [place].AT,PERRAL Supple Neck,No JVD, No cervical lymphadenopathy appriciated.  Symmetrical Chest wall movement, Good air movement bilaterally, CTAB RRR,No Gallops,Rubs or new Murmurs, No Parasternal Heave +ve B.Sounds, Abd Soft, No tenderness, No organomegaly appriciated, No rebound - guarding or rigidity. No Cyanosis, Clubbing or edema, No new Rash or bruise      Data Review:    CBC Recent Labs  Lab 12/22/18 0145 12/22/18 0251 12/23/18 0440  WBC SPECIMEN CLOTTED PRUITT,G @ 0157 ON 12/22/18 BY JUW 15.3* 10.1  HGB SPECIMEN CLOTTED PRUITT,G @ 0157  ON 12/22/18 BY JUW 12.8 10.0*  HCT SPECIMEN CLOTTED PRUITT,G @ 0157 ON 12/22/18 BY JUW 41.7 31.9*  PLT SPECIMEN CLOTTED PRUITT,G @ 0157 ON 12/22/18 BY JUW 356 316  MCV SPECIMEN CLOTTED PRUITT,G @ 0157 ON 12/22/18 BY JUW 98.8 91.7  MCH SPECIMEN CLOTTED PRUITT,G @ 0157 ON 12/22/18 BY JUW 30.3 28.7  MCHC SPECIMEN CLOTTED PRUITT,G @ 0157 ON 12/22/18 BY JUW 30.7 31.3  RDW SPECIMEN  CLOTTED PRUITT,G @ 0157 ON 12/22/18 BY JUW 13.4 13.4  LYMPHSABS PENDING 1.6  --   MONOABS PENDING 0.9  --   EOSABS PENDING 0.2  --   BASOSABS PENDING 0.1  --     Chemistries  Recent Labs  Lab 12/22/18 0145 12/23/18 0440  NA 140 142  K 3.5 3.6  CL 108 109  CO2 22 22  GLUCOSE 90 93  BUN 14 7*  CREATININE 0.80 0.89  CALCIUM 8.9 8.6*  MG 2.2 1.9  AST 37  --   ALT 18  --   ALKPHOS 78  --   BILITOT 0.3  --    ------------------------------------------------------------------------------------------------------------------ No results for input(s): CHOL, HDL, LDLCALC, TRIG, CHOLHDL, LDLDIRECT in the last 72 hours.  Lab Results  Component Value Date   HGBA1C 5.7 (H) 09/14/2016   ------------------------------------------------------------------------------------------------------------------ No results for input(s): TSH, T4TOTAL, T3FREE, THYROIDAB in the last 72 hours.  Invalid input(s): FREET3 ------------------------------------------------------------------------------------------------------------------ No results for input(s): VITAMINB12, FOLATE, FERRITIN, TIBC, IRON, RETICCTPCT in the last 72 hours.  Coagulation profile No results for input(s): INR, PROTIME in the last 168 hours.  No results for input(s): DDIMER in the last 72 hours.  Cardiac Enzymes No results for input(s): CKMB, TROPONINI, MYOGLOBIN in the last 168 hours.  Invalid input(s): CK ------------------------------------------------------------------------------------------------------------------    Component Value Date/Time   BNP 18.0 01/11/2016 0115    Micro Results No results found for this or any previous visit (from the past 240 hour(s)).  Radiology Reports Ct Abdomen Pelvis Wo Contrast  Result Date: 12/19/2018 CLINICAL DATA:  73 year old female with a history 7 pound weight loss EXAM: CT ABDOMEN AND PELVIS WITHOUT CONTRAST TECHNIQUE: Multidetector CT imaging of the abdomen and pelvis was  performed following the standard protocol without IV contrast. COMPARISON:  None. FINDINGS: Lower chest: No acute finding of the lower chest Hepatobiliary: Unremarkable liver.  Unremarkable gallbladder Pancreas: Unremarkable pancreas Spleen: Unremarkable spleen Adrenals/Urinary Tract: Unremarkable appearance of the adrenal glands. No evidence of hydronephrosis of the right or left kidney. No nephrolithiasis. Unremarkable course of the bilateral ureters. Unremarkable appearance of the urinary bladder. Stomach/Bowel: Unremarkable stomach. Unremarkable small bowel. Normal appendix. Moderate stool burden. No obstruction. Vascular/Lymphatic: Mild atherosclerotic changes of the abdominal aorta. The patient has a common origin of the celiac artery and SMA (celiacomesenteric trunk). Dense calcifications at the origin of the artery. Calcifications at the origin the renal arteries. No adenopathy. Reproductive: Calcified fibroids of the uterus. Other: Small fat containing umbilical hernia. Musculoskeletal: Degenerative changes of the spine. No acute displaced fracture. IMPRESSION: No acute CT finding. The patient has variant mesenteric arterial anatomy, with a celiacomesenteric trunk. There are dense calcifications at the origin of the artery. If the patient has symptoms of chronic mesenteric ischemia, mesenteric duplex may be considered for further evaluation, or alternatively CT angiogram. Aortic Atherosclerosis (ICD10-I70.0). Additional ancillary findings as above. Electronically Signed   By: Corrie Mckusick D.O.   On: 12/19/2018 13:16   Dg Chest 2 View  Result Date: 12/09/2018 CLINICAL DATA:  73 year old female with epigastric pain since 0430 hours.  Chest pain and burning. Query hiatal hernia. EXAM: CHEST - 2 VIEW COMPARISON:  01/18/2018 and earlier. FINDINGS: Normal cardiac size and mediastinal contours. No evidence of gastric hiatal hernia. Paucity of bowel gas and no pneumoperitoneumin the upper abdomen. Abdominal  Calcified aortic atherosclerosis. Visualized tracheal air column is within normal limits. No pneumothorax, pleural effusion or confluent pulmonary opacity. Mildly increased interstitial markings diffusely appears stable. No acute pulmonary opacity. No acute osseous abnormality identified. IMPRESSION: 1.  No acute cardiopulmonary abnormality. 2. No evidence of a hiatal hernia. Paucity of bowel gas in the upper abdomen with no free air. Electronically Signed   By: Genevie Ann M.D.   On: 12/09/2018 06:49   Vas [place] Mesenteric  Result Date: 12/23/2018 ABDOMINAL VISCERAL Indications: abdominal pain Comparison Study: CT angio abdomen and pelvis on 12/22/18 showed variant anatomy                   with celiac artery and SMA with no flow limiting stenosis. The                   spenic and common hepatic arteries are replaced to the SMA. Performing Technologist: Oda Cogan RDMS, RVT  Examination Guidelines: A complete evaluation includes B-mode imaging, spectral Doppler, color Doppler, and power Doppler as needed of all accessible portions of each vessel. Bilateral testing is considered an integral part of a complete examination. Limited examinations for reoccurring indications may be performed as noted.  Duplex Findings: +--------------------+--------+--------+------+---------------------+ Mesenteric          PSV cm/sEDV cm/sPlaque      Comments        +--------------------+--------+--------+------+---------------------+ Aorta at SMA           65                                       +--------------------+--------+--------+------+---------------------+ Celiac Artery Origin  268                                       +--------------------+--------+--------+------+---------------------+ SMA Origin                                Celiomesenteric trunk +--------------------+--------+--------+------+---------------------+ SMA Proximal          289                                        +--------------------+--------+--------+------+---------------------+ SMA Mid               143                                       +--------------------+--------+--------+------+---------------------+ SMA Distal             90                                       +--------------------+--------+--------+------+---------------------+ CHA                    84                                       +--------------------+--------+--------+------+---------------------+  Splenic               110                                       +--------------------+--------+--------+------+---------------------+  Summary: Mesenteric:  Patient has a known celiomesenteric trunk with mild atheromatous narrowing at the origin.  Patent celiac and SMA with no hemodynamically significant stenosis noted. IMA was not clearly visualized.  *See table(s) above for measurements and observations.  Diagnosing physician: Ruta Hinds MD  Electronically signed by Ruta Hinds MD on 12/23/2018 at 10:26:00 AM.    Final    Ct Angio Abd/pel W And/or Wo Contrast  Result Date: 12/22/2018 CLINICAL DATA:  Sudden severe abdominal pain with diarrhea. Known mesenteric anomaly. EXAM: CTA ABDOMEN AND PELVIS wITHOUT AND WITH CONTRAST TECHNIQUE: Multidetector CT imaging of the abdomen and pelvis was performed using the standard protocol during bolus administration of intravenous contrast. Multiplanar reconstructed images and MIPs were obtained and reviewed to evaluate the vascular anatomy. CONTRAST:  182mL ISOVUE-370 IOPAMIDOL (ISOVUE-370) INJECTION 76% COMPARISON:  09/19/2019 FINDINGS: VASCULAR Aorta: Atheromatous plaque. No aneurysm, dissection, or inflammatory changes. Celiac: Variant anatomy with left gastric and phrenic arteries arising at the orthotopic celiac position, with narrowing from atherosclerosis and/or median arcuate ligament impression, quantification limited by small vessel size SMA: The splenic and common hepatic  arteries are replaced to the SMA. Plaque at the origin without flow limiting stenosis. No branch occlusion. Renals: Plaque at both ostia without flow limiting stenosis. No beading or aneurysm seen IMA: Patent Inflow: Bulky partially calcified plaque projecting into the origin of the right common iliac artery with high-grade narrowing on reformats. No aneurysm or dissection. Proximal Outflow: Atherosclerosis without stenosis or occlusion. Veins: Negative.  The left colic vein and IMV are patent. Review of the MIP images confirms the above findings. NON-VASCULAR Lower chest:  No contributory findings. Hepatobiliary: No focal liver abnormality.No evidence of biliary obstruction or stone. Pancreas: Unremarkable. Spleen: Unremarkable. Adrenals/Urinary Tract: Negative adrenals. No hydronephrosis or stone. Mild patchy right renal cortical scarring. Unremarkable bladder. Stomach/Bowel: Left-sided colonic wall thickening and mild fat haziness with normalization by the rectum. No appendicitis. Lymphatic:    No mass or adenopathy. Reproductive:Partially calcified fibroid measuring 3.5 cm. Other: No ascites or pneumoperitoneum. Musculoskeletal: No acute abnormalities. Lumbar facet arthropathy with grade 1 anterolisthesis at L4-5. IMPRESSION: VASCULAR 1. No emergent finding. 2. Celiomesenteric trunk with mild atheromatous narrowing at the origin. 3. High-grade atheromatous narrowing at the origin of the right common iliac artery. NON-VASCULAR Left-sided colitis which could be infectious or from nonocclusive ischemia. Electronically Signed   By: Monte Fantasia M.D.   On: 12/22/2018 04:53    Time Spent in minutes  30   Lala Lund M.D on 12/23/2018 at 11:54 AM  To page go to [email or web address] - password [facility]

## 2018-12-24 ENCOUNTER — Encounter (HOSPITAL_COMMUNITY): Payer: Self-pay | Admitting: General Practice

## 2018-12-24 MED ORDER — METRONIDAZOLE 500 MG PO TABS: 500.0000 mg | ORAL_TABLET | Freq: Three times a day (TID) | ORAL | 0 refills | Status: AC

## 2018-12-24 NOTE — Discharge Instructions (Signed)
Follow with Primary MD [name], MD in 7 days   Get CBC, CMP checked  by Primary MD  in 5-7 days   Activity: As tolerated with Full fall precautions use walker/cane & assistance as needed  Disposition Home    Diet: Heart Healthy     Special Instructions: If you have smoked or chewed Tobacco  in the last 2 yrs please stop smoking, stop any regular Alcohol  and or any Recreational drug use.  On your next visit with your primary care physician please Get Medicines reviewed and adjusted.  Please request your Prim.MD to go over all Hospital Tests and Procedure/Radiological results at the follow up, please get all Hospital records sent to your Prim MD by signing hospital release before you go home.  If you experience worsening of your admission symptoms, develop shortness of breath, life threatening emergency, suicidal or homicidal thoughts you must seek medical attention immediately by calling 911 or calling your MD immediately  if symptoms less severe.  You Must read complete instructions/literature along with all the possible adverse reactions/side effects for all the Medicines you take and that have been prescribed to you. Take any new Medicines after you have completely understood and accpet all the possible adverse reactions/side effects.

## 2018-12-24 NOTE — Discharge Summary (Signed)
Lindsey Mullins VQQ:[id number] DOB: [birth year] DOA: 12/22/2018  PCP: Lindsey Rossetti, MD  Admit date: 12/22/2018  Discharge date: 12/24/2018  Admitted From: Home   Disposition:  Home   Recommendations for Outpatient Follow-up:   Follow up with PCP in 1-2 weeks  PCP Please obtain BMP/CBC, 2 view CXR in 1week,  (see Discharge instructions)   PCP Please follow up on the following pending results: None   Home Health:  None   Equipment/Devices: None  Consultations: VVS Discharge Condition: Stable   CODE STATUS: Full   Diet Recommendation: Heart Healthy   Diet Order            Diet - low sodium heart healthy        DIET SOFT Room service appropriate? Yes; Fluid consistency: Thin  Diet effective now               Chief Complaint  Patient presents with  . Abdominal Pain     Brief history of present illness from the day of admission and additional interim summary    Patient is a 73 year old African-American female past medical history significant for TIA, restless leg syndrome, hypertension, hyperlipidemia, GERD, cerebral atherosclerosis and atypical chest pain, presented to any pain hospital with excruciating abdominal pain sudden onset with watery diarrhea.  There was suspicion of mesenteric ischemia and she was sent to [facility] for further evaluation.                                                                 Hospital Course   1.  Abdominal pain and colitis.  Likely gastroenteritis. Suspicion for mesenteric ischemia, seen by vascular surgeon Dr. Oneida Alar, CT angiogram of the abdomen and vascular ultrasound both do not confirm any acute vascular insult.  He was cleared from vascular standpoint by Dr. Oneida Alar, this likely was gastroenteritis which has now completely resolved she is tolerating diet, will be  placed on 4 more days of oral Flagyl with outpatient GI follow-up.  Daughter informs me that patient has had 2 or 3 similar episodes in the past 1 to 2 years.  Hence outpatient GI follow-up is prudent.  Does have some mesenteric disease burden for which I will have her follow with Dr. Oneida Alar in the outpatient setting as well.  Continue aspirin and statin for secondary prevention from the standpoint.   2. CAD.  Continue combination of aspirin, Coreg and statin for secondary prevention no acute issues.  3.  Hypertension.  Continue combination of beta-blocker and ACE inhibitor.  Stable.   Discharge diagnosis     Active Problems:   Abdominal pain   Colitis    Discharge instructions    Discharge Instructions    Diet - low sodium heart healthy   Complete by:  As directed  Discharge instructions   Complete by:  As directed    Follow with Primary MD Lindsey Rossetti, MD in 7 days   Get CBC, CMP checked  by Primary MD  in 5-7 days   Activity: As tolerated with Full fall precautions use walker/cane & assistance as needed  Disposition Home    Diet: Heart Healthy     Special Instructions: If you have smoked or chewed Tobacco  in the last 2 yrs please stop smoking, stop any regular Alcohol  and or any Recreational drug use.  On your next visit with your primary care physician please Get Medicines reviewed and adjusted.  Please request your Prim.MD to go over all Hospital Tests and Procedure/Radiological results at the follow up, please get all Hospital records sent to your Prim MD by signing hospital release before you go home.  If you experience worsening of your admission symptoms, develop shortness of breath, life threatening emergency, suicidal or homicidal thoughts you must seek medical attention immediately by calling 911 or calling your MD immediately  if symptoms less severe.  You Must read complete instructions/literature along with all the possible adverse reactions/side  effects for all the Medicines you take and that have been prescribed to you. Take any new Medicines after you have completely understood and accpet all the possible adverse reactions/side effects.   Increase activity slowly   Complete by:  As directed       Discharge Medications   Allergies as of 12/24/2018      Reactions   Penicillins Shortness Of Breath, Other (See Comments)   Has patient had a PCN reaction causing immediate rash, facial/tongue/throat swelling, SOB or lightheadedness with hypotension: Yes Has patient had a PCN reaction causing severe rash involving mucus membranes or skin necrosis: Unk Has patient had a PCN reaction that required hospitalization: Unk Has patient had a PCN reaction occurring within the last 10 years: Yes "States that she was in ICU after being administered"   Codeine Nausea And Vomiting   Lipitor [atorvastatin] Other (See Comments)   Myalgias   Lisinopril Cough   Pravastatin Other (See Comments)   Myalgias      Medication List    STOP taking these medications   ibuprofen 200 MG tablet Commonly known as:  ADVIL,MOTRIN     TAKE these medications   acetaminophen 325 MG tablet Commonly known as:  TYLENOL Take 325-650 mg by mouth every 6 (six) hours as needed (for headaches or pain).   albuterol 108 (90 Base) MCG/ACT inhaler Commonly known as:  PROVENTIL HFA;VENTOLIN HFA Inhale 1-2 puffs into the lungs every 6 (six) hours as needed for wheezing or shortness of breath.   aspirin 81 MG tablet Take 81 mg by mouth 2 (two) times daily.   carvedilol 3.125 MG tablet Commonly known as:  COREG TAKE 1 TABLET BY MOUTH ONCE DAILY   cyclobenzaprine 5 MG tablet Commonly known as:  FLEXERIL TAKE 1 TABLET BY MOUTH AT BEDTIME AS NEEDED FOR MUSCLE SPASM OR  LEG  CRAMPS What changed:  See the new instructions.   loratadine 10 MG tablet Commonly known as:  CLARITIN Take 10 mg by mouth daily.   losartan 50 MG tablet Commonly known as:  COZAAR TAKE 1/2  (ONE-HALF) TABLET BY MOUTH ONCE DAILY What changed:  See the new instructions.   Magnesium 250 MG Tabs Take 250 mg by mouth 2 (two) times daily.   metroNIDAZOLE 500 MG tablet Commonly known as:  FLAGYL Take 1 tablet (500  mg total) by mouth 3 (three) times daily for 14 days.   nitroGLYCERIN 0.4 MG SL tablet Commonly known as:  NITROSTAT DISSOLVE ONE TABLET UNDER THE TONGUE AS NEEDED FOR CHEST PAIN**MAX 3 TABLETS EACH 5 MINUTES APART**   NON FORMULARY Take 2 tablets by mouth See admin instructions. VitaFusion Gummivites for Women: Chew 2 gummies by mouth once a day   Omega-3 Krill Oil 500 MG Caps Take 500 mg by mouth daily.   omeprazole 20 MG capsule Commonly known as:  PRILOSEC Take 1 po BID x 2 weeks then once a day What changed:    how much to take  how to take this  when to take this  additional instructions   rosuvastatin 20 MG tablet Commonly known as:  CRESTOR Take 1 tablet (20 mg total) by mouth daily.   traMADol 50 MG tablet Commonly known as:  ULTRAM Take 1 tablet (50 mg total) by mouth every 8 (eight) hours as needed. What changed:  reasons to take this       Follow-up Information    Bangor, Modena Nunnery, MD. Schedule an appointment as soon as possible for a visit in 1 week(s).   Specialty:  Family Medicine Contact information: [address] [place] [address] [phone]        Danie Binder, MD. Schedule an appointment as soon as possible for a visit in 1 week(s).   Specialty:  Gastroenterology Contact information: [address] [place] [address] [phone]        Elam Dutch, MD. Schedule an appointment as soon as possible for a visit in 1 week(s).   Specialties:  Vascular Surgery, Cardiology Contact information: [address] [place] [address] [phone]           Major procedures and Radiology Reports - PLEASE review detailed and final reports thoroughly  -      CT - No acute CT finding. The  patient has variant mesenteric arterial anatomy, with a celiacomesenteric trunk. There are dense calcifications at the origin of the artery. If the patient has symptoms of chronic mesenteric ischemia, mesenteric duplex may be considered for further evaluation, or alternatively CT angiogram. Aortic Atherosclerosis (ICD10-I70.0)  CT angio - IMPRESSION: VASCULAR 1. No emergent finding. 2. Celiomesenteric trunk with mild atheromatous narrowing at the origin. 3. High-grade atheromatous narrowing at the origin of the right common iliac artery. NON-VASCULAR Left-sided colitis which could be infectious or from nonocclusive ischemia.  Abd Art [place] -  Mesenteric:  Patient has a known celiomesenteric trunk with mild atheromatous narrowing at the origin.  Patent celiac and SMA with no hemodynamically significant stenosis noted. IMA was not clearly visualized.  *See table(s) above for measurements and observations.   Ct Abdomen Pelvis Wo Contrast  Result Date: 12/19/2018 CLINICAL DATA:  73 year old female with a history 7 pound weight loss EXAM: CT ABDOMEN AND PELVIS WITHOUT CONTRAST TECHNIQUE: Multidetector CT imaging of the abdomen and pelvis was performed following the standard protocol without IV contrast. COMPARISON:  None. FINDINGS: Lower chest: No acute finding of the lower chest Hepatobiliary: Unremarkable liver.  Unremarkable gallbladder Pancreas: Unremarkable pancreas Spleen: Unremarkable spleen Adrenals/Urinary Tract: Unremarkable appearance of the adrenal glands. No evidence of hydronephrosis of the right or left kidney. No nephrolithiasis. Unremarkable course of the bilateral ureters. Unremarkable appearance of the urinary bladder. Stomach/Bowel: Unremarkable stomach. Unremarkable small bowel. Normal appendix. Moderate stool burden. No obstruction. Vascular/Lymphatic: Mild atherosclerotic changes of the abdominal aorta. The patient has a common  origin of the celiac artery and SMA (celiacomesenteric trunk).  Dense calcifications at the origin of the artery. Calcifications at the origin the renal arteries. No adenopathy. Reproductive: Calcified fibroids of the uterus. Other: Small fat containing umbilical hernia. Musculoskeletal: Degenerative changes of the spine. No acute displaced fracture. IMPRESSION: No acute CT finding. The patient has variant mesenteric arterial anatomy, with a celiacomesenteric trunk. There are dense calcifications at the origin of the artery. If the patient has symptoms of chronic mesenteric ischemia, mesenteric duplex may be considered for further evaluation, or alternatively CT angiogram. Aortic Atherosclerosis (ICD10-I70.0). Additional ancillary findings as above. Electronically Signed   By: Corrie Mckusick D.O.   On: 12/19/2018 13:16   Dg Chest 2 View  Result Date: 12/09/2018 CLINICAL DATA:  73 year old female with epigastric pain since 0430 hours. Chest pain and burning. Query hiatal hernia. EXAM: CHEST - 2 VIEW COMPARISON:  01/18/2018 and earlier. FINDINGS: Normal cardiac size and mediastinal contours. No evidence of gastric hiatal hernia. Paucity of bowel gas and no pneumoperitoneumin the upper abdomen. Abdominal Calcified aortic atherosclerosis. Visualized tracheal air column is within normal limits. No pneumothorax, pleural effusion or confluent pulmonary opacity. Mildly increased interstitial markings diffusely appears stable. No acute pulmonary opacity. No acute osseous abnormality identified. IMPRESSION: 1.  No acute cardiopulmonary abnormality. 2. No evidence of a hiatal hernia. Paucity of bowel gas in the upper abdomen with no free air. Electronically Signed   By: Genevie Ann M.D.   On: 12/09/2018 06:49   Vas [place] Mesenteric  Result Date: 12/23/2018 ABDOMINAL VISCERAL Indications: abdominal pain Comparison Study: CT angio abdomen and pelvis on 12/22/18 showed variant anatomy                   with celiac artery and SMA with no flow limiting stenosis. The                   spenic and  common hepatic arteries are replaced to the SMA. Performing Technologist: Oda Cogan RDMS, RVT  Examination Guidelines: A complete evaluation includes B-mode imaging, spectral Doppler, color Doppler, and power Doppler as needed of all accessible portions of each vessel. Bilateral testing is considered an integral part of a complete examination. Limited examinations for reoccurring indications may be performed as noted.  Duplex Findings: +--------------------+--------+--------+------+---------------------+ Mesenteric          PSV cm/sEDV cm/sPlaque      Comments        +--------------------+--------+--------+------+---------------------+ Aorta at SMA           65                                       +--------------------+--------+--------+------+---------------------+ Celiac Artery Origin  268                                       +--------------------+--------+--------+------+---------------------+ SMA Origin                                Celiomesenteric trunk +--------------------+--------+--------+------+---------------------+ SMA Proximal          289                                       +--------------------+--------+--------+------+---------------------+  SMA Mid               143                                       +--------------------+--------+--------+------+---------------------+ SMA Distal             90                                       +--------------------+--------+--------+------+---------------------+ CHA                    84                                       +--------------------+--------+--------+------+---------------------+ Splenic               110                                       +--------------------+--------+--------+------+---------------------+  Summary: Mesenteric:  Patient has a known celiomesenteric trunk with mild atheromatous narrowing at the origin.  Patent celiac and SMA with no hemodynamically significant  stenosis noted. IMA was not clearly visualized.  *See table(s) above for measurements and observations.  Diagnosing physician: Ruta Hinds MD  Electronically signed by Ruta Hinds MD on 12/23/2018 at 10:26:00 AM.    Final    Ct Angio Abd/pel W And/or Wo Contrast  Result Date: 12/22/2018 CLINICAL DATA:  Sudden severe abdominal pain with diarrhea. Known mesenteric anomaly. EXAM: CTA ABDOMEN AND PELVIS wITHOUT AND WITH CONTRAST TECHNIQUE: Multidetector CT imaging of the abdomen and pelvis was performed using the standard protocol during bolus administration of intravenous contrast. Multiplanar reconstructed images and MIPs were obtained and reviewed to evaluate the vascular anatomy. CONTRAST:  183mL ISOVUE-370 IOPAMIDOL (ISOVUE-370) INJECTION 76% COMPARISON:  09/19/2019 FINDINGS: VASCULAR Aorta: Atheromatous plaque. No aneurysm, dissection, or inflammatory changes. Celiac: Variant anatomy with left gastric and phrenic arteries arising at the orthotopic celiac position, with narrowing from atherosclerosis and/or median arcuate ligament impression, quantification limited by small vessel size SMA: The splenic and common hepatic arteries are replaced to the SMA. Plaque at the origin without flow limiting stenosis. No branch occlusion. Renals: Plaque at both ostia without flow limiting stenosis. No beading or aneurysm seen IMA: Patent Inflow: Bulky partially calcified plaque projecting into the origin of the right common iliac artery with high-grade narrowing on reformats. No aneurysm or dissection. Proximal Outflow: Atherosclerosis without stenosis or occlusion. Veins: Negative.  The left colic vein and IMV are patent. Review of the MIP images confirms the above findings. NON-VASCULAR Lower chest:  No contributory findings. Hepatobiliary: No focal liver abnormality.No evidence of biliary obstruction or stone. Pancreas: Unremarkable. Spleen: Unremarkable. Adrenals/Urinary Tract: Negative adrenals. No hydronephrosis  or stone. Mild patchy right renal cortical scarring. Unremarkable bladder. Stomach/Bowel: Left-sided colonic wall thickening and mild fat haziness with normalization by the rectum. No appendicitis. Lymphatic:    No mass or adenopathy. Reproductive:Partially calcified fibroid measuring 3.5 cm. Other: No ascites or pneumoperitoneum. Musculoskeletal: No acute abnormalities. Lumbar facet arthropathy with grade 1 anterolisthesis at L4-5. IMPRESSION: VASCULAR 1. No emergent finding. 2. Celiomesenteric trunk with mild atheromatous  narrowing at the origin. 3. High-grade atheromatous narrowing at the origin of the right common iliac artery. NON-VASCULAR Left-sided colitis which could be infectious or from nonocclusive ischemia. Electronically Signed   By: Monte Fantasia M.D.   On: 12/22/2018 04:53    Micro Results     No results found for this or any previous visit (from the past 240 hour(s)).  Today   Subjective    Lindsey Mullins today has no headache,no chest abdominal pain,no new weakness tingling or numbness, feels much better wants to go home today.     Objective   Blood pressure (!) 142/69, pulse 75, temperature 98.2 F (36.8 C), temperature source Oral, resp. rate 17, height 5\' 6"  (1.676 m), weight 84 kg, SpO2 97 %.  No intake or output data in the 24 hours ending 12/24/18 0840  Exam  Awake Alert, Oriented x 3, No new F.N deficits, Normal affect [place].AT,PERRAL Supple Neck,No JVD, No cervical lymphadenopathy appriciated.  Symmetrical Chest wall movement, Good air movement bilaterally, CTAB RRR,No Gallops,Rubs or new Murmurs, No Parasternal Heave +ve B.Sounds, Abd Soft, Non tender, No organomegaly appriciated, No rebound -guarding or rigidity. No Cyanosis, Clubbing or edema, No new Rash or bruise   Data Review   CBC w Diff:  Lab Results  Component Value Date   WBC 10.1 12/23/2018   HGB 10.0 (L) 12/23/2018   HCT 31.9 (L) 12/23/2018   PLT 316 12/23/2018   LYMPHOPCT 11 12/22/2018    BANDSPCT PENDING 12/22/2018   MONOPCT 6 12/22/2018   EOSPCT 1 12/22/2018   BASOPCT 1 12/22/2018    CMP:  Lab Results  Component Value Date   NA 142 12/23/2018   K 3.6 12/23/2018   CL 109 12/23/2018   CO2 22 12/23/2018   BUN 7 (L) 12/23/2018   CREATININE 0.89 12/23/2018   CREATININE 0.81 07/25/2018   PROT 7.8 12/22/2018   ALBUMIN 4.0 12/22/2018   BILITOT 0.3 12/22/2018   ALKPHOS 78 12/22/2018   AST 37 12/22/2018   ALT 18 12/22/2018  .   Total Time in preparing paper work, data evaluation and todays exam - 30 minutes  Lala Lund M.D on 12/24/2018 at 8:40 AM  [facility]   Office  [phone]

## 2018-12-29 ENCOUNTER — Ambulatory Visit (INDEPENDENT_AMBULATORY_CARE_PROVIDER_SITE_OTHER): Payer: Medicare Other | Admitting: Internal Medicine

## 2018-12-31 ENCOUNTER — Ambulatory Visit: Payer: Medicare Other | Admitting: Family Medicine

## 2018-12-31 ENCOUNTER — Encounter: Payer: Self-pay | Admitting: Family Medicine

## 2018-12-31 VITALS — BP 126/70 | HR 78 | Temp 97.4°F | Resp 14 | Ht 66.5 in | Wt 185.0 lb

## 2018-12-31 DIAGNOSIS — K551 Chronic vascular disorders of intestine: Secondary | ICD-10-CM

## 2018-12-31 DIAGNOSIS — K529 Noninfective gastroenteritis and colitis, unspecified: Secondary | ICD-10-CM

## 2018-12-31 MED ORDER — OMEPRAZOLE 20 MG PO CPDR: DELAYED_RELEASE_CAPSULE | ORAL | 2 refills | Status: DC

## 2018-12-31 MED ORDER — ROSUVASTATIN CALCIUM 20 MG PO TABS: 20.0000 mg | ORAL_TABLET | Freq: Every day | ORAL | 3 refills | Status: DC

## 2018-12-31 NOTE — Assessment & Plan Note (Signed)
Outpatient vascular follow up arranged, on statin and aspirin

## 2018-12-31 NOTE — Progress Notes (Signed)
   Subjective:    Patient ID: Lindsey Mullins, female    DOB: Sep 17, 1946, 73 y.o.   MRN: [id number]  Patient presents for Hospitalization Follow-up  Pt here to for hospital follow up for abdominal pain. There was concern for mesenteric ischemia as her CT scan showed calcifications she was evaluated by vascular surgery who did CT angiogram did not feel that this was mesenteric ischemia but will see as an outpatient.  She was then treated for probable colitis/enteritis with IV antibiotics and transition to oral Flagyl.  Her abdominal pain has improved she has not had any further diarrhea.  She is also on omeprazole 20 mg once a day which was initially given to her at her first ER visit on the 21st.  She has follow-up established with GI on the 24th.  She is still very careful the things that she eats she is afraid that her stomach will go back into knots.  She is eating very soft foods but has been able to tolerate a [place] sandwich.  She  But CT angio  Mild anemia seen Hb 10, WBC initially up at 15.3  Weight as been steady, has not had any further loss.  She does admit that she had cut out soda coffee sweets when she started having stomach pains.   Hospital notes and discharge summary reviewed in detail at bedside.  Review Of Systems:  GEN- denies fatigue, fever, weight loss,weakness, recent illness HEENT- denies eye drainage, change in vision, nasal discharge, CVS- denies chest pain, palpitations RESP- denies SOB, cough, wheeze ABD- denies N/V, change in stools, +abd pain GU- denies dysuria, hematuria, dribbling, incontinence MSK- denies joint pain, muscle aches, injury Neuro- denies headache, dizziness, syncope, seizure activity       Objective:    BP 126/70   Pulse 78   Temp (!) 97.4 F (36.3 C) (Oral)   Resp 14   Ht 5' 6.5" (1.689 m)   Wt 185 lb (83.9 kg)   SpO2 100%   BMI 29.41 kg/m  GEN- NAD, alert and oriented x3 HEENT- PERRL, EOMI, non injected sclera, pink  conjunctiva, MMM, oropharynx clear Neck- Supple, no LAD  CVS- RRR, no murmur RESP-CTAB ABD-NABS,soft,mild TTP epigastric region, ND EXT- No edema Pulses- Radial, DP- 2+        Assessment & Plan:      Problem List Items Addressed This Visit      Unprioritized   Arteriosclerosis, mesenteric artery [facility])    Outpatient vascular follow up arranged, on statin and aspirin      Relevant Medications   rosuvastatin (CRESTOR) 20 MG tablet   Other Relevant Orders   CBC with Differential/Platelet   Comprehensive metabolic panel   Ambulatory referral to Vascular Surgery   Colitis - Primary    improved pain, still unclear exact cause, on PPI now  and will finish flagyl, has appt with GI scheduled ,weight steady      Relevant Orders   CBC with Differential/Platelet   Comprehensive metabolic panel      Note: This dictation was prepared with Dragon dictation along with smaller phrase technology. Any transcriptional errors that result from this process are unintentional.

## 2018-12-31 NOTE — Patient Instructions (Addendum)
Referral to Dr. Oneida Alar Vascular surgeon Keep appointment with Dr. Laural Golden which is Stomach doctor Take omeprazole once a day  Finish antibiotics  F/U 4 months

## 2018-12-31 NOTE — Assessment & Plan Note (Signed)
improved pain, still unclear exact cause, on PPI now  and will finish flagyl, has appt with GI scheduled ,weight steady

## 2019-01-01 LAB — COMPREHENSIVE METABOLIC PANEL
AG Ratio: 1.3 (calc) (ref 1.0–2.5)
ALT: 19 U/L (ref 6–29)
AST: 35 U/L (ref 10–35)
Albumin: 4 g/dL (ref 3.6–5.1)
Alkaline phosphatase (APISO): 82 U/L (ref 37–153)
BUN: 10 mg/dL (ref 7–25)
CO2: 21 mmol/L (ref 20–32)
Calcium: 9.4 mg/dL (ref 8.6–10.4)
Chloride: 108 mmol/L (ref 98–110)
Creat: 0.83 mg/dL (ref 0.60–0.93)
GLUCOSE: 62 mg/dL — AB (ref 65–99)
Globulin: 3 g/dL (calc) (ref 1.9–3.7)
Potassium: 3.9 mmol/L (ref 3.5–5.3)
Sodium: 141 mmol/L (ref 135–146)
Total Bilirubin: 0.4 mg/dL (ref 0.2–1.2)
Total Protein: 7 g/dL (ref 6.1–8.1)

## 2019-01-01 LAB — CBC WITH DIFFERENTIAL/PLATELET
Absolute Monocytes: 597 cells/uL (ref 200–950)
Basophils Absolute: 58 cells/uL (ref 0–200)
Basophils Relative: 1 %
Eosinophils Absolute: 267 cells/uL (ref 15–500)
Eosinophils Relative: 4.6 %
HCT: 36.1 % (ref 35.0–45.0)
Hemoglobin: 12.1 g/dL (ref 11.7–15.5)
Lymphs Abs: 2001 cells/uL (ref 850–3900)
MCH: 30.3 pg (ref 27.0–33.0)
MCHC: 33.5 g/dL (ref 32.0–36.0)
MCV: 90.5 fL (ref 80.0–100.0)
MPV: 10.3 fL (ref 7.5–12.5)
Monocytes Relative: 10.3 %
Neutro Abs: 2877 cells/uL (ref 1500–7800)
Neutrophils Relative %: 49.6 %
Platelets: 375 10*3/uL (ref 140–400)
RBC: 3.99 10*6/uL (ref 3.80–5.10)
RDW: 13.2 % (ref 11.0–15.0)
Total Lymphocyte: 34.5 %
WBC: 5.8 10*3/uL (ref 3.8–10.8)

## 2019-01-06 ENCOUNTER — Encounter: Payer: Self-pay | Admitting: *Deleted

## 2019-01-12 ENCOUNTER — Ambulatory Visit (INDEPENDENT_AMBULATORY_CARE_PROVIDER_SITE_OTHER): Payer: Medicare Other | Admitting: Internal Medicine

## 2019-01-12 ENCOUNTER — Encounter (INDEPENDENT_AMBULATORY_CARE_PROVIDER_SITE_OTHER): Payer: Self-pay | Admitting: Internal Medicine

## 2019-01-14 ENCOUNTER — Encounter: Payer: Self-pay | Admitting: Cardiovascular Disease

## 2019-01-14 ENCOUNTER — Ambulatory Visit: Payer: Medicare Other | Admitting: Cardiovascular Disease

## 2019-01-14 DIAGNOSIS — I10 Essential (primary) hypertension: Secondary | ICD-10-CM

## 2019-01-14 DIAGNOSIS — I6523 Occlusion and stenosis of bilateral carotid arteries: Secondary | ICD-10-CM

## 2019-01-14 DIAGNOSIS — E782 Mixed hyperlipidemia: Secondary | ICD-10-CM

## 2019-01-14 NOTE — Patient Instructions (Signed)
Medication Instructions:  Your physician recommends that you continue on your current medications as directed. Please refer to the Current Medication list given to you today.  If you need a refill on your cardiac medications before your next appointment, please call your pharmacy.   Lab work: NONE If you have labs (blood work) drawn today and your tests are completely normal, you will receive your results only by: [name] MyChart Message (if you have MyChart) OR . A paper copy in the mail If you have any lab test that is abnormal or we need to change your treatment, we will call you to review the results.  Testing/Procedures: Your physician has requested that you have a carotid duplex. This test is an ultrasound of the carotid arteries in your neck. It looks at blood flow through these arteries that supply the brain with blood. Allow one hour for this exam. There are no restrictions or special instructions.  SCHEDULE April 2020  Follow-Up: At [facility], you and your health needs are our priority.  As part of our continuing mission to provide you with exceptional heart care, we have created designated Provider Care Teams.  These Care Teams include your primary Cardiologist (physician) and Advanced Practice Providers (APPs -  Physician Assistants and Nurse Practitioners) who all work together to provide you with the care you need, when you need it. . You will need a follow up appointment in 12 months.  Please call our office 2 months in advance to schedule this appointment.  You may see Dr. Gwenlyn Found or one of the following Advanced Practice Providers on your designated Care Team:   . [name], [place] . Almyra Deforest, PA-C . Fabian Sharp, PA-C . Jory Sims, DNP . Rosaria Ferries, PA-C . Roby Lofts, PA-C . Sande Rives, PA-C

## 2019-01-14 NOTE — Addendum Note (Signed)
Addended by: Annita Brod on: 01/14/2019 12:57 PM   Modules accepted: Orders

## 2019-01-14 NOTE — Assessment & Plan Note (Signed)
History of essential hypertension her blood pressure measured today at 171/82.  Normally her blood pressure is much lower than this.  She is on carvedilol and losartan.

## 2019-01-14 NOTE — Assessment & Plan Note (Signed)
History of hyperlipidemia on Crestor with lipid profile performed 07/25/2018 revealing total pressure 178, LDL 109 and HDL 46.

## 2019-01-14 NOTE — Progress Notes (Signed)
01/14/2019 Renita Papa   1946/04/11  [id number]  Primary Physician [name], MD Primary Cardiologist: Lorretta Harp MD FACP, [place], [place], [place]  HPI:  Lindsey Mullins is a 73 y.o.  mildly overweight, divorced, African American female, mother of two, grandmother to one grandchild, who I last saw in the office  02/08/2017. [name] She has a history of hypertension, hyperlipidemia, and vascular disease. I performed an angiogram on her on July 14, 2005, revealing a high grade left internal carotid artery stenosis. She underwent elective left carotid endarterectomy by Dr. Drucie Opitz on August 09, 2005, with followup Dopplers done in our office as recently as last October showing a widely patent endarterectomy site with moderate right ICA stenosis. She remains neurologically asymptomatic. She was seen in the office by [name] with complaints of chest burning. She also has a history of GERD. She has had multiple negative Myoviews in the past and a Lexiscan Myoview performed on May 23, 2012, was negative as well. She was hospitalized overnight 01/11/16 for atypical chest pain thought to be related to GERD.[name] Since I saw her several years ago she was recently hospitalized earlier this year with abdominal pain.  She did have mesenteric Dopplers which were unrevealing and apparently is going to see a gastroenterologist for possible EGD.  She denies chest pain.  Current Meds  Medication Sig  . acetaminophen (TYLENOL) 325 MG tablet Take 325-650 mg by mouth every 6 (six) hours as needed (for headaches or pain).  [name] aspirin 81 MG tablet Take 81 mg by mouth 2 (two) times daily.   . carvedilol (COREG) 3.125 MG tablet TAKE 1 TABLET BY MOUTH ONCE DAILY  . cyclobenzaprine (FLEXERIL) 5 MG tablet TAKE 1 TABLET BY MOUTH AT BEDTIME AS NEEDED FOR MUSCLE SPASM OR  LEG  CRAMPS  . losartan (COZAAR) 50 MG tablet TAKE 1/2 (ONE-HALF) TABLET BY MOUTH ONCE DAILY  . nitroGLYCERIN (NITROSTAT) 0.4 MG SL tablet  DISSOLVE ONE TABLET UNDER THE TONGUE AS NEEDED FOR CHEST PAIN**MAX 3 TABLETS EACH 5 MINUTES APART**  . Omega-3 Krill Oil 500 MG CAPS Take 500 mg by mouth daily.   [name] omeprazole (PRILOSEC) 20 MG capsule Take 1 po daily  . rosuvastatin (CRESTOR) 20 MG tablet Take 1 tablet (20 mg total) by mouth daily.     Allergies  Allergen Reactions  . Penicillins Shortness Of Breath and Other (See Comments)    Has patient had a PCN reaction causing immediate rash, facial/tongue/throat swelling, SOB or lightheadedness with hypotension: Yes Has patient had a PCN reaction causing severe rash involving mucus membranes or skin necrosis: Unk Has patient had a PCN reaction that required hospitalization: Unk Has patient had a PCN reaction occurring within the last 10 years: Yes "States that she was in ICU after being administered"   . Codeine Nausea And Vomiting  . Lipitor [Atorvastatin] Other (See Comments)    Myalgias  . Lisinopril Cough  . Pravastatin Other (See Comments)    Myalgias    Social History   Socioeconomic History  . Marital status: Divorced    Spouse name: Not on file  . Number of children: Not on file  . Years of education: Not on file  . Highest education level: Not on file  Occupational History  . Not on file  Social Needs  . Financial resource strain: Not on file  . Food insecurity:    Worry: Not on file    Inability: Not on file  .  Transportation needs:    Medical: Not on file    Non-medical: Not on file  Tobacco Use  . Smoking status: Former Smoker    Last attempt to quit: 04/21/1983    Years since quitting: 35.7  . Smokeless tobacco: Never Used  Substance and Sexual Activity  . Alcohol use: No  . Drug use: No  . Sexual activity: Not on file  Lifestyle  . Physical activity:    Days per week: Not on file    Minutes per session: Not on file  . Stress: Not on file  Relationships  . Social connections:    Talks on phone: Not on file    Gets together: Not on file     Attends religious service: Not on file    Active member of club or organization: Not on file    Attends meetings of clubs or organizations: Not on file    Relationship status: Not on file  . Intimate partner violence:    Fear of current or ex partner: Not on file    Emotionally abused: Not on file    Physically abused: Not on file    Forced sexual activity: Not on file  Other Topics Concern  . Not on file  Social History Narrative  . Not on file     Review of Systems: General: negative for chills, fever, night sweats or weight changes.  Cardiovascular: negative for chest pain, dyspnea on exertion, edema, orthopnea, palpitations, paroxysmal nocturnal dyspnea or shortness of breath Dermatological: negative for rash Respiratory: negative for cough or wheezing Urologic: negative for hematuria Abdominal: negative for nausea, vomiting, diarrhea, bright red blood per rectum, melena, or hematemesis Neurologic: negative for visual changes, syncope, or dizziness All other systems reviewed and are otherwise negative except as noted above.    Blood pressure (!) 171/82, pulse 88, height 5' 6.5" (1.689 m), weight 183 lb 6.4 oz (83.2 kg), SpO2 100 %.  General appearance: alert and no distress Neck: no adenopathy, no carotid bruit, no JVD, supple, symmetrical, trachea midline and thyroid not enlarged, symmetric, no tenderness/mass/nodules Lungs: clear to auscultation bilaterally Heart: regular rate and rhythm, S1, S2 normal, no murmur, click, rub or gallop Extremities: extremities normal, atraumatic, no cyanosis or edema Pulses: 2+ and symmetric Skin: Skin color, texture, turgor normal. No rashes or lesions Neurologic: Alert and oriented X 3, normal strength and tone. Normal symmetric reflexes. Normal coordination and gait  EKG not performed today  ASSESSMENT AND PLAN:   Essential hypertension, benign History of essential hypertension her blood pressure measured today at 171/82.  Normally  her blood pressure is much lower than this.  She is on carvedilol and losartan.  Hyperlipidemia History of hyperlipidemia on Crestor with lipid profile performed 07/25/2018 revealing total pressure 178, LDL 109 and HDL 46.  Carotid artery disease (HCC) History of carotid artery disease status post elective left carotid endarterectomy performed by Dr. Drucie Opitz 08/09/2005 with follow-up Dopplers performed 03/06/2018 revealing a widely patent endarterectomy site with moderate right ICA stenosis.  This will be checked on annual basis.      Lorretta Harp MD FACP,FACC,FAHA, [facility] 01/14/2019 12:40 PM

## 2019-01-14 NOTE — Assessment & Plan Note (Signed)
History of carotid artery disease status post elective left carotid endarterectomy performed by Dr. Drucie Opitz 08/09/2005 with follow-up Dopplers performed 03/06/2018 revealing a widely patent endarterectomy site with moderate right ICA stenosis.  This will be checked on annual basis.

## 2019-02-19 ENCOUNTER — Other Ambulatory Visit: Payer: Self-pay | Admitting: Family Medicine

## 2019-03-16 ENCOUNTER — Other Ambulatory Visit: Payer: Self-pay

## 2019-03-16 ENCOUNTER — Ambulatory Visit (HOSPITAL_COMMUNITY)
Admission: RE | Admit: 2019-03-16 | Discharge: 2019-03-16 | Disposition: A | Discharge status: Home / Self Care | Payer: Medicare Other | Source: Ambulatory Visit | Attending: Internal Medicine | Admitting: Internal Medicine

## 2019-03-16 DIAGNOSIS — I6523 Occlusion and stenosis of bilateral carotid arteries: Secondary | ICD-10-CM | POA: Insufficient documentation

## 2019-03-18 ENCOUNTER — Other Ambulatory Visit: Payer: Self-pay

## 2019-03-18 ENCOUNTER — Encounter: Payer: Self-pay | Admitting: Family Medicine

## 2019-03-18 ENCOUNTER — Ambulatory Visit (INDEPENDENT_AMBULATORY_CARE_PROVIDER_SITE_OTHER): Payer: Medicare Other | Admitting: Family Medicine

## 2019-03-18 VITALS — BP 128/72 | HR 88 | Temp 98.1°F | Resp 14 | Ht 66.5 in | Wt 178.0 lb

## 2019-03-18 DIAGNOSIS — M62830 Muscle spasm of back: Secondary | ICD-10-CM | POA: Diagnosis not present

## 2019-03-18 DIAGNOSIS — M1612 Unilateral primary osteoarthritis, left hip: Secondary | ICD-10-CM | POA: Diagnosis not present

## 2019-03-18 DIAGNOSIS — M5136 Other intervertebral disc degeneration, lumbar region: Secondary | ICD-10-CM

## 2019-03-18 MED ORDER — TIZANIDINE HCL 4 MG PO TABS: 4.0000 mg | ORAL_TABLET | Freq: Four times a day (QID) | ORAL | 1 refills | Status: DC | PRN

## 2019-03-18 MED ORDER — TRAMADOL HCL 50 MG PO TABS: 50.0000 mg | ORAL_TABLET | Freq: Three times a day (TID) | ORAL | 0 refills | Status: AC | PRN

## 2019-03-18 NOTE — Patient Instructions (Addendum)
Try Tylenol 2 tablets twice a day  Try the zanaflex  Referral to orthopedics  F/U 4 months for Physical

## 2019-03-18 NOTE — Progress Notes (Signed)
Subjective:    Patient ID: Lindsey Mullins, female    DOB: [birth year], 73 y.o.   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 SHe has known DDD. She does have to help lift her elderly mother who is mostly wheelchair bound.  Nothing else specific in the past few days to cause injury.   If she moves quickly or turns a certain a way, pain shoots down from mid back down to lower back, denies any radiating symptoms  SHe has been using aspercreme which helps a little Tylenol is not helping she only takes 1 tablet was asking if she could increase this to see if this will help with the pain Flexeril he does take at nighttime but it does make her sleepy she is worried about taking this during the day.  Never saw orthopedics last year for her back pain or her chronic hip pain though she is not having any hip pain today  Review Of Systems:  GEN- denies fatigue, fever, weight loss,weakness, recent illness HEENT- denies eye drainage, change in vision, nasal discharge, CVS- denies chest pain, palpitations RESP- denies SOB, cough, wheeze ABD- denies N/V, change in stools, abd pain GU- denies dysuria, hematuria, dribbling, incontinence MSK- + joint pain, +muscle aches, injury Neuro- denies headache, dizziness, syncope, seizure activity       Objective:    BP 128/72   Pulse 88   Temp 98.1 F (36.7 C) (Oral)   Resp 14   Ht 5' 6.5" (1.689 m)   Wt 178 lb (80.7 kg)   SpO2 100%   BMI 28.30 kg/m  GEN- NAD, alert and oriented x3 HEENT- PERRL, EOMI, non injected sclera, pink conjunctiva, MMM, oropharynx clear Neck- Supple, no thyromegaly CVS- RRR, no murmur RESP-CTAB ABD-NABS,soft,NT,ND MSK- TTP lumbar spine, fair ROM, Fair ROM HIPS, Neg SLR, fair ROM bilat knee, no effusion No bruising on back. Neg Hip  Rock, + spasm in parapinals , slow gait, antalgic,  Neuro- normal tone LE, sensation in tact  Skin - in tact        Assessment & Plan:      Problem List Items Addressed This Visit      Unprioritized   DDD (degenerative disc disease), lumbar - Primary   Relevant Medications   traMADol (ULTRAM) 50 MG tablet   tiZANidine (ZANAFLEX) 4 MG tablet    Other Visit Diagnoses    Back muscle spasm       Muscle spasm in setting of chronic back pain DDD, started with fall on Job about 2 years ago, She wans to try Tylenol 650mg  BID first, given zanaflex may tolerate better during the daytime.  I also gave her tramadol in case the Tylenol does not work.  She can take 25 mg if need be.  We will get a go ahead and refer her to orthopedics advised that she will be seen for a few months because of the COVID she is aware of this.  This is not a new problem she does not have any red flags such as loss of bowel or bladder or any new concerning neuropathy.   Primary osteoarthritis of left hip       Relevant Medications   traMADol (ULTRAM) 50 MG tablet   tiZANidine (ZANAFLEX) 4 MG  tablet      Note: This dictation was prepared with Dragon dictation along with smaller phrase technology. Any transcriptional errors that result from this process are unintentional.

## 2019-03-23 ENCOUNTER — Other Ambulatory Visit: Payer: Self-pay

## 2019-03-23 DIAGNOSIS — I6523 Occlusion and stenosis of bilateral carotid arteries: Secondary | ICD-10-CM

## 2019-03-23 NOTE — Progress Notes (Signed)
  Notes recorded by Lorretta Harp, MD on 03/17/2019 at 4:27 PM EDT No change from prior study. Repeat in 12 months

## 2019-03-24 ENCOUNTER — Telehealth: Payer: Self-pay | Admitting: *Deleted

## 2019-03-24 NOTE — Telephone Encounter (Signed)
Received call from patient. [phone]~ telephone.   Reports that back pain has worsened and would like referral to ortho to be expedited. Also states that she would like to see Dr. Erlinda Hong at [facility].   Referral coordinator to be made aware.

## 2019-03-24 NOTE — Telephone Encounter (Signed)
Spoke with patient and informed her that we have sent her referral to [facility]. Also informed her that I put the referral in as urgent however it may be delayed due to COVID-19. Patient verbalized understanding.

## 2019-03-25 ENCOUNTER — Encounter: Payer: Self-pay | Admitting: Orthopaedic Surgery

## 2019-03-25 ENCOUNTER — Ambulatory Visit (INDEPENDENT_AMBULATORY_CARE_PROVIDER_SITE_OTHER): Payer: Medicare Other | Admitting: Orthopaedic Surgery

## 2019-03-25 ENCOUNTER — Ambulatory Visit: Payer: Self-pay

## 2019-03-25 ENCOUNTER — Other Ambulatory Visit: Payer: Self-pay

## 2019-03-25 DIAGNOSIS — M545 Low back pain, unspecified: Secondary | ICD-10-CM

## 2019-03-25 MED ORDER — METHYLPREDNISOLONE 4 MG PO TBPK: ORAL_TABLET | ORAL | 0 refills | Status: DC

## 2019-03-25 NOTE — Progress Notes (Signed)
Office Visit Note   Patient: Lindsey Mullins           Date of Birth: 1946/04/24           MRN: [id number] Visit Date: 03/25/2019              Requested by: [name], MD [address] [place], [place] [address] PCP: [name], MD   Assessment & Plan: Visit Diagnoses:  1. Low back pain, unspecified back pain laterality, unspecified chronicity, unspecified whether sciatica present     Plan: Impression is acute low back pain due to lumbar spondylosis.  She has been given a prescription for tramadol by her PCP already but she has not really taken any because she is afraid of the side effects but I did recommend that she try to give her some relief.  A Medrol Dosepak was prescribed to hopefully improve her symptoms.  She also already has a prescription for tizanidine.  We did administer an IM Toradol injection today to give her pain relief.  Questions encouraged and answered.  Follow-up as needed.  Follow-Up Instructions: Return if symptoms worsen or fail to improve.   Orders:  Orders Placed This Encounter  Procedures  . XR Lumbar Spine 2-3 Views   Meds ordered this encounter  Medications  . methylPREDNISolone (MEDROL DOSEPAK) 4 MG TBPK tablet    Sig: Use as directed    Dispense:  21 tablet    Refill:  0      Procedures: No procedures performed   Clinical Data: No additional findings.   Subjective: Chief Complaint  Patient presents with  . Lower Back - Pain    Lindsey Mullins is a very pleasant 73 year old female comes in today for severe low back pain with occasional radiation in her legs for the last 2 weeks.  This has progressively gotten worse.  She is afraid to take medicines in general due to potential side effects therefore she has been cutting all of her medicines in half in order to take a smaller dose.  She denies any constitutional symptoms or any bowel or bladder dysfunction.  Denies any numbness and tingling in her feet.  Denies any groin pain.    Review of Systems  Constitutional: Negative.   HENT: Negative.   Eyes: Negative.   Respiratory: Negative.   Cardiovascular: Negative.   Endocrine: Negative.   Musculoskeletal: Negative.   Neurological: Negative.   Hematological: Negative.   Psychiatric/Behavioral: Negative.   All other systems reviewed and are negative.    Objective: Vital Signs: There were no vitals taken for this visit.  Physical Exam Vitals signs and nursing note reviewed.  Constitutional:      Appearance: She is well-developed.  HENT:     Head: Normocephalic and atraumatic.  Neck:     Musculoskeletal: Neck supple.  Pulmonary:     Effort: Pulmonary effort is normal.  Abdominal:     Palpations: Abdomen is soft.  Skin:    General: Skin is warm.     Capillary Refill: Capillary refill takes less than 2 seconds.  Neurological:     Mental Status: She is alert and oriented to person, place, and time.  Psychiatric:        Behavior: Behavior normal.        Thought Content: Thought content normal.        Judgment: Judgment normal.     Ortho Exam Low back exam shows tenderness along spinous processes and tenderness along  the posterior beltline region.  There are no focal motor or sensory deficits.  Strength is limited secondary to pain in the back.  Normal reflexes Specialty Comments:  No specialty comments available.  Imaging: Xr Lumbar Spine 2-3 Views  Result Date: 03/25/2019 Preservation of lumbar lordosis.  Moderate degenerative disc disease of L5-S1.  Lumbar spondylosis.    PMFS History: Patient Active Problem List   Diagnosis Date Noted  . Arteriosclerosis, mesenteric artery ([place]) 12/31/2018  . Abdominal pain 12/22/2018  . Colitis 12/22/2018  . Osteopenia   . DDD (degenerative disc disease), lumbar 05/11/2016  . Obesity 05/11/2016  . Glucose intolerance (impaired glucose tolerance) 01/12/2016  . Pain in the chest   . Chest pain 01/10/2016  . GERD (gastroesophageal reflux disease)  01/26/2015  . Stress and adjustment reaction 01/26/2015  . Dysuria 11/30/2013  . Carotid artery disease ([place]) 04/20/2013  . Bunion, left 09/21/2012  . Overweight(278.02) 06/04/2012  . Back pain 04/18/2012  . Essential hypertension, benign 04/02/2012  . Hyperlipidemia 04/02/2012  . Dysphagia 04/02/2012  . RLS (restless legs syndrome) 04/02/2012   Past Medical History:  Diagnosis Date  . Atypical chest pain 05/23/2012   STRESS TEST - small to moderate sized area of partial reversibility of the anteroapical wall, most likely breast artifact, post-stress EF 67%, EKG show NSR at 65, No Lexiscan EKG changes, non-diagnostic for ischemia; STRESS TEST, 01/25/2010 - normal study, post-stress EF 66%, no significant ischemia  . Cerebral atherosclerosis 09/29/2012   CAROTID DUPLEX - RIGHT  BULB/PROXIMAL ICA-moderate amount of fibrous plaque, 50-69% diameter reduction; LEFT CEA-normal, no significant diameter reduction  . Colitis   . GERD (gastroesophageal reflux disease)   . Hyperlipidemia   . Hypertension   . Osteopenia   . Restless leg syndrome   . Shortness of breath 03/30/2005   2D ECHO - EF >55%, normal  . TIA (transient ischemic attack) 01/27/2010   2D ECHO - EF 65%, normal    Family History  Problem Relation Age of Onset  . Heart disease Mother   . Hypertension Mother   . Hypertension Father   . Heart disease Father     Past Surgical History:  Procedure Laterality Date  . ABDOMINAL EXPLORATION SURGERY    . CAROTID ENDARTERECTOMY    . Peripheral Vascular Catheterization  07/16/2005   Right verterbral-50% smooth segmental badsilar artery stenosis on right side, Right carotid-50% ulcerated proxmial stenosis, Left carotid-60 to 70% left externam carotid artery stenosis, 90% proximal left ICA stenosis   Social History   Occupational History  . Not on file  Tobacco Use  . Smoking status: Former Smoker    Last attempt to quit: 04/21/1983    Years since quitting: 35.9  . Smokeless  tobacco: Never Used  Substance and Sexual Activity  . Alcohol use: No  . Drug use: No  . Sexual activity: Not on file

## 2019-03-30 ENCOUNTER — Other Ambulatory Visit: Payer: Self-pay

## 2019-03-30 ENCOUNTER — Emergency Department (HOSPITAL_COMMUNITY)
Admission: EM | Admit: 2019-03-30 | Discharge: 2019-03-30 | Disposition: A | Discharge status: Home / Self Care | Payer: Medicare Other | Attending: Emergency Medicine | Admitting: Emergency Medicine

## 2019-03-30 ENCOUNTER — Encounter (HOSPITAL_COMMUNITY): Payer: Self-pay | Admitting: Emergency Medicine

## 2019-03-30 ENCOUNTER — Emergency Department (HOSPITAL_COMMUNITY): Payer: Medicare Other

## 2019-03-30 ENCOUNTER — Telehealth: Payer: Self-pay

## 2019-03-30 DIAGNOSIS — I10 Essential (primary) hypertension: Secondary | ICD-10-CM | POA: Insufficient documentation

## 2019-03-30 DIAGNOSIS — Z79899 Other long term (current) drug therapy: Secondary | ICD-10-CM | POA: Diagnosis not present

## 2019-03-30 DIAGNOSIS — M545 Low back pain, unspecified: Secondary | ICD-10-CM

## 2019-03-30 DIAGNOSIS — Z87891 Personal history of nicotine dependence: Secondary | ICD-10-CM | POA: Diagnosis not present

## 2019-03-30 DIAGNOSIS — R109 Unspecified abdominal pain: Secondary | ICD-10-CM | POA: Insufficient documentation

## 2019-03-30 DIAGNOSIS — Z7982 Long term (current) use of aspirin: Secondary | ICD-10-CM | POA: Diagnosis not present

## 2019-03-30 MED ORDER — HYDROMORPHONE HCL 1 MG/ML IJ SOLN
1.0000 mg | Freq: Once | INTRAMUSCULAR | Status: AC
  Administered 2019-03-30: 1 mg via INTRAMUSCULAR
  Filled 2019-03-30: qty 1

## 2019-03-30 MED ORDER — HYDROCODONE-ACETAMINOPHEN 5-325 MG PO TABS: 0.5000 | ORAL_TABLET | Freq: Four times a day (QID) | ORAL | 0 refills | Status: DC | PRN

## 2019-03-30 NOTE — ED Triage Notes (Signed)
Pt reports back pains have continued since last week. Saw her PCP last week for it and couldn't get in to see him til tomorrow. Reports nothing is relieving the pains, medications, heat, etc.

## 2019-03-30 NOTE — Discharge Instructions (Addendum)
If you develop worsening, recurrent, or continued back pain, numbness or weakness in the legs, incontinence of your bowels or bladders, numbness of your buttocks, fever, abdominal pain, or any other new/concerning symptoms then return to the ER for evaluation.   Do NOT take Tramadol, as this can mix with the Hydrocodone you are being prescribed.

## 2019-03-30 NOTE — Telephone Encounter (Signed)
Pt called back asking to be seen today. I explained to pt Dr. Erlinda Hong was in surgery this afternoon.  Pt stated she can't wait to be seen and she is going to go to [facility] due to the amount of pain she is in.  I did make pt an apt for tomorrow at 1 just incase she didn't go to the hospital.

## 2019-03-30 NOTE — Telephone Encounter (Signed)
Called and left a VM for patient to call back for an appointment with Dr. Erlinda Hong.

## 2019-03-30 NOTE — ED Provider Notes (Signed)
[facility] DEPT Provider Note   CSN: [id number] Arrival date & time: 03/30/19  1123    History   Chief Complaint Chief Complaint  Patient presents with   Back Pain    HPI Lindsey Mullins is a 73 y.o. female.     HPI  73 year old female presents with severe low back pain.  She states is been ongoing for about a week.  She saw an orthopedist and had x-rays performed.  She was placed on a Medrol dose pack but states the pain is not any better.  There have been no injuries.  It started when she got back from a car ride to and from [place] but she is not sure if that caused the pain.  The pain is much worse when she stands up or lies flat or moves in any way.  No abdominal pain, urinary symptoms or fever.  The pain does not radiate, including to her legs.  It hurts to walk but there is no weakness in her legs or numbness.  No incontinence.  She has tried a muscle relaxer that is prescribed to her.  She has tramadol prescribed but has not taken it.  She is scared of meds and side effects.  Past Medical History:  Diagnosis Date   Atypical chest pain 05/23/2012   STRESS TEST - small to moderate sized area of partial reversibility of the anteroapical wall, most likely breast artifact, post-stress EF 67%, EKG show NSR at 65, No Lexiscan EKG changes, non-diagnostic for ischemia; STRESS TEST, 01/25/2010 - normal study, post-stress EF 66%, no significant ischemia   Cerebral atherosclerosis 09/29/2012   CAROTID DUPLEX - RIGHT  BULB/PROXIMAL ICA-moderate amount of fibrous plaque, 50-69% diameter reduction; LEFT CEA-normal, no significant diameter reduction   Colitis    GERD (gastroesophageal reflux disease)    Hyperlipidemia    Hypertension    Osteopenia    Restless leg syndrome    Shortness of breath 03/30/2005   2D ECHO - EF >55%, normal   TIA (transient ischemic attack) 01/27/2010   2D ECHO - EF 65%, normal    Patient Active Problem List   Diagnosis  Date Noted   Arteriosclerosis, mesenteric artery ([place]) 12/31/2018   Abdominal pain 12/22/2018   Colitis 12/22/2018   Osteopenia    DDD (degenerative disc disease), lumbar 05/11/2016   Obesity 05/11/2016   Glucose intolerance (impaired glucose tolerance) 01/12/2016   Pain in the chest    Chest pain 01/10/2016   GERD (gastroesophageal reflux disease) 01/26/2015   Stress and adjustment reaction 01/26/2015   Dysuria 11/30/2013   Carotid artery disease ([place]) 04/20/2013   Bunion, left 09/21/2012   Overweight(278.02) 06/04/2012   Back pain 04/18/2012   Essential hypertension, benign 04/02/2012   Hyperlipidemia 04/02/2012   Dysphagia 04/02/2012   RLS (restless legs syndrome) 04/02/2012    Past Surgical History:  Procedure Laterality Date   ABDOMINAL EXPLORATION SURGERY     CAROTID ENDARTERECTOMY     Peripheral Vascular Catheterization  07/16/2005   Right verterbral-50% smooth segmental badsilar artery stenosis on right side, Right carotid-50% ulcerated proxmial stenosis, Left carotid-60 to 70% left externam carotid artery stenosis, 90% proximal left ICA stenosis     OB History    Gravida  2   Para  2   Term  2   Preterm      AB      Living        SAB      TAB  Ectopic      Multiple      Live Births               Home Medications    Prior to Admission medications   Medication Sig Start Date End Date Taking? Authorizing Provider  acetaminophen (TYLENOL) 325 MG tablet Take 325-650 mg by mouth every 6 (six) hours as needed (for headaches or pain).   Yes [provider]  aspirin 81 MG tablet Take 81 mg by mouth 2 (two) times daily.    Yes [provider]  carvedilol (COREG) 3.125 MG tablet TAKE 1 TABLET BY MOUTH ONCE DAILY Patient taking differently: Take 3.125 mg by mouth daily.  08/25/18  Yes [place], Modena Nunnery, MD  losartan (COZAAR) 50 MG tablet TAKE 1/2 (ONE-HALF) TABLET BY MOUTH ONCE DAILY Patient taking  differently: Take 25 mg by mouth daily.  06/10/18  Yes Lorretta Harp, MD  nitroGLYCERIN (NITROSTAT) 0.4 MG SL tablet DISSOLVE ONE TABLET UNDER THE TONGUE AS NEEDED FOR CHEST PAIN**MAX 3 TABLETS EACH 5 MINUTES APART** Patient taking differently: Place 0.4 mg under the tongue every 5 (five) minutes as needed for chest pain. Max 3 tablets 09/14/16  Yes [place], Modena Nunnery, MD  Omega-3 Krill Oil 500 MG CAPS Take 500 mg by mouth daily.    Yes [provider]  omeprazole (PRILOSEC) 20 MG capsule Take 1 po daily Patient taking differently: Take 20 mg by mouth at bedtime.  12/31/18  Yes [place], Modena Nunnery, MD  rosuvastatin (CRESTOR) 20 MG tablet Take 1 tablet (20 mg total) by mouth daily. Patient taking differently: Take 10 mg by mouth daily.  12/31/18  Yes [place], Modena Nunnery, MD  tiZANidine (ZANAFLEX) 4 MG tablet Take 1 tablet (4 mg total) by mouth every 6 (six) hours as needed for muscle spasms. Patient taking differently: Take 2 mg by mouth every 6 (six) hours as needed for muscle spasms.  03/18/19  Yes [place], Modena Nunnery, MD  cyclobenzaprine (FLEXERIL) 5 MG tablet TAKE 1 TABLET BY MOUTH AT BEDTIME AS NEEDED FOR MUSCLE SPASM OR  LEG  CRAMPS Patient not taking: No sig reported 06/10/18   [name], MD  HYDROcodone-acetaminophen (NORCO) 5-325 MG tablet Take 0.5-1 tablets by mouth every 6 (six) hours as needed. 03/30/19   Sherwood Gambler, MD  methylPREDNISolone (MEDROL DOSEPAK) 4 MG TBPK tablet Use as directed Patient not taking: Reported on 03/30/2019 03/25/19   Leandrew Koyanagi, MD    Family History Family History  Problem Relation Age of Onset   Heart disease Mother    Hypertension Mother    Hypertension Father    Heart disease Father     Social History Social History   Tobacco Use   Smoking status: Former Smoker    Last attempt to quit: 04/21/1983    Years since quitting: 35.9   Smokeless tobacco: Never Used  Substance Use Topics   Alcohol use: No   Drug use: No      Allergies   Penicillins; Codeine; Lipitor [atorvastatin]; Lisinopril; and Pravastatin   Review of Systems Review of Systems  Constitutional: Negative for fever.  Gastrointestinal: Negative for abdominal pain.  Genitourinary: Negative for dysuria and hematuria.       No incontinence  Musculoskeletal: Positive for back pain.  Neurological: Negative for weakness and numbness.  All other systems reviewed and are negative.    Physical Exam Updated Vital Signs BP (!) 175/82    Pulse (!) 59    Temp 98  F (36.7 C) (Oral)    Resp 19    SpO2 97%   Physical Exam Vitals signs and nursing note reviewed.  Constitutional:      General: She is not in acute distress.    Appearance: She is well-developed. She is not ill-appearing or diaphoretic.  HENT:     Head: Normocephalic and atraumatic.     Right Ear: External ear normal.     Left Ear: External ear normal.     Nose: Nose normal.  Eyes:     General:        Right eye: No discharge.        Left eye: No discharge.  Cardiovascular:     Rate and Rhythm: Normal rate and regular rhythm.     Heart sounds: Normal heart sounds.  Pulmonary:     Effort: Pulmonary effort is normal.     Breath sounds: Normal breath sounds.  Abdominal:     General: There is no distension.     Palpations: Abdomen is soft.     Tenderness: There is no abdominal tenderness.  Musculoskeletal:     Lumbar back: She exhibits tenderness.       Back:     Comments: Diffuse tenderness across the lumbar back.  The most significant point of tenderness is her right posterior pelvis  Skin:    General: Skin is warm and dry.  Neurological:     Mental Status: She is alert.     Comments: 5/5 strength in BLE. Normal gross sensation. Normal ambulation, though painful.  Psychiatric:        Mood and Affect: Mood is not anxious.      ED Treatments / Results  Labs (all labs ordered are listed, but only abnormal results are displayed) Labs Reviewed - No data to  display  EKG None  Radiology Ct Lumbar Spine Wo Contrast  Result Date: 03/30/2019 CLINICAL DATA:  Low back pain radiating to the right hip. No reported injury. EXAM: CT LUMBAR SPINE WITHOUT CONTRAST TECHNIQUE: Multidetector CT imaging of the lumbar spine was performed without intravenous contrast administration. Multiplanar CT image reconstructions were also generated. COMPARISON:  Lumbar spine radiographs 03/25/2019. CTA abdomen and pelvis 12/22/2018. FINDINGS: Segmentation: Standard. Alignment: 3 mm of facet mediated anterolisthesis of L4 on L5. Vertebrae: No acute fracture or suspicious osseous lesion. Paraspinal and other soft tissues: Aortic atherosclerosis without aneurysm. No acute paraspinal soft tissue findings. Disc levels: T12-L1: Negative. L1-2: Minimal disc bulging and minimal facet arthrosis without stenosis. L2-3: Mild disc bulging and mild facet arthrosis result in minimal right greater than left neural foraminal narrowing without significant stenosis. L3-4: Mild disc bulging and mild facet and ligamentum flavum hypertrophy result in minimal right neural foraminal narrowing without evidence of spinal stenosis. L4-5: Anterolisthesis with asymmetric leftward bulging of uncovered disc and severe facet and ligamentum flavum hypertrophy result in left greater than right lateral recess stenosis and moderate left neural foraminal stenosis potentially affecting the left L4 and L5 nerve roots. L5-S1: Mild disc bulging and severe right and mild left facet arthrosis result in mild-to-moderate right neural foraminal stenosis without spinal stenosis. A right far lateral disc osteophyte complex may affect the right L5 nerve. IMPRESSION: 1. Severe L4-5 facet arthrosis with grade 1 anterolisthesis, moderate left neural foraminal stenosis, and left greater than right lateral recess stenosis. 2. Mild-to-moderate right neural foraminal stenosis at L5-S1 with a right far lateral disc osteophyte complex  potentially affecting the right L5 nerve. 3.  Aortic Atherosclerosis (ICD10-I70.0). Electronically  Signed   By: Logan Bores M.D.   On: 03/30/2019 15:04   Ct Pelvis Wo Contrast  Result Date: 03/30/2019 CLINICAL DATA:  Acute right hip pain without known injury. EXAM: CT PELVIS WITHOUT CONTRAST TECHNIQUE: Multidetector CT imaging of the pelvis was performed following the standard protocol without intravenous contrast. COMPARISON:  CT scan of December 22, 2018. FINDINGS: Urinary Tract:  No abnormality visualized. Bowel:  Unremarkable visualized pelvic bowel loops. Vascular/Lymphatic: Atherosclerosis of abdominal aorta is noted with atherosclerosis of iliac arteries. No adenopathy is noted. Reproductive: Calcified uterine fibroid is noted. No adnexal abnormality is noted. Other:  None. Musculoskeletal: No suspicious bone lesions identified. IMPRESSION: No abnormality seen involving either hip. Calcified uterine fibroid is noted. Aortic Atherosclerosis (ICD10-I70.0). Electronically Signed   By: Marijo Conception M.D.   On: 03/30/2019 15:12    Procedures Procedures (including critical care time)  Medications Ordered in ED Medications  HYDROmorphone (DILAUDID) injection 1 mg (1 mg Intramuscular Given 03/30/19 1406)     Initial Impression / Assessment and Plan / ED Course  I have reviewed the triage vital signs and the nursing notes.  Pertinent labs & imaging results that were available during my care of the patient were reviewed by me and considered in my medical decision making (see chart for details).        Given degree of pain with a negative lumbar x-ray a week ago, CT was obtained.  No fracture.  There is some small anterolisthesis but otherwise no acute findings.  She is feeling better after IM Dilaudid.  I will discharge with Norco as needed and we counseled on possible side effects.  She stopped tramadol, which she is barely taking anyway.  She has no signs or symptoms of acute spinal cord  emergency and she was cautioned on these and given strict return precautions.  Otherwise follow-up with her orthopedist.  I doubt acute infection.  Final Clinical Impressions(s) / ED Diagnoses   Final diagnoses:  Acute bilateral low back pain without sciatica    ED Discharge Orders         Ordered    HYDROcodone-acetaminophen (NORCO) 5-325 MG tablet  Every 6 hours PRN     03/30/19 1535           Sherwood Gambler, MD 03/30/19 1657

## 2019-03-31 ENCOUNTER — Encounter: Payer: Self-pay | Admitting: Orthopaedic Surgery

## 2019-03-31 ENCOUNTER — Ambulatory Visit: Payer: Medicare Other | Admitting: Orthopaedic Surgery

## 2019-03-31 DIAGNOSIS — M5441 Lumbago with sciatica, right side: Secondary | ICD-10-CM

## 2019-03-31 MED ORDER — PREDNISONE 50 MG PO TABS: ORAL_TABLET | ORAL | 0 refills | Status: DC

## 2019-03-31 NOTE — Progress Notes (Signed)
Office Visit Note   Patient: Lindsey Mullins           Date of Birth: [birth year]           MRN: [id number] Visit Date: 03/31/2019              Requested by: [name], MD [address] [place], [place] [address] PCP: [name], MD   Assessment & Plan: Visit Diagnoses:  1. Acute bilateral low back pain with right-sided sciatica     Plan: Impression is acute lower back pain which is failed conservative treatment.  We will go ahead and obtain an urgent MRI of the lumbar spine to rule out epidural abscess.  We will call her with the results.  In the meantime, we will go ahead and refer her to Dr. Ernestina Patches for [facility].  She will follow-up with [place] as needed.  Call with concerns or questions in the meantime.  Follow-Up Instructions: Return for with Dr. Ernestina Patches after MRI for [facility].   Orders:  Orders Placed This Encounter  Procedures  . MR Lumbar Spine w/o contrast  . Ambulatory referral to Physical Medicine Rehab   Meds ordered this encounter  Medications  . predniSONE (DELTASONE) 50 MG tablet    Sig: Take once pill once daily x 3 days    Dispense:  3 tablet    Refill:  0      Procedures: No procedures performed   Clinical Data: No additional findings.   Subjective: Chief Complaint  Patient presents with  . Lower Back - Pain    HPI patient is a pleasant 73 year old female presents our clinic today with continued lower back pain.  This began following a trip to [place].  She was sitting in a car for period of time and noticed increased pain the day after her return home.  This was approximately 2 weeks ago.  Pain has worsened over the past 2 weeks.  The pain she has is to the entire lower back, primarily to the right side.  She describes this as a constant pain worse going from a seated to standing position, lying on her back or turning over in the bed.  She does get pain going down her right leg when she is from a seated to standing position.  She denies any  weakness to either lower extremity.  No bowel or bladder change and no saddle paresthesias.  She denies any numbness, tingling or burning to either lower extremity.  She was seen in our office on 03/25/2019 where she was given an IM Toradol injection as well as a prescription for a Medrol dose pack.  Neither have been of benefit.  She was seen in the ED yesterday for continued worsening pain.  CT scan was performed which showed severe L4-5 facet arthrosis with grade 1 anterolisthesis, moderate left neural foraminal stenosis and lateral recess stenosis.  Mild to moderate right neural foraminal stenosis at L5-S1.  She was given a prescription of Norco from the ED but has not filled this.  She did try tramadol which was given her from her PCP which has been of no help.  She has tried heat and ice which of not been of benefit either.  No fevers or chills.  She comes in today for further evaluation treatment recommendation.  Review of Systems as detailed in HPI.  All others reviewed and are negative.   Objective: Vital Signs: There were no vitals taken for this visit.  Physical Exam well-developed well-nourished female in slight discomfort.  Alert and oriented x3.  Ortho Exam examination of her lumbar spine reveals moderate tenderness to the lower lumbar spine as well as right-sided paraspinous tenderness.  Increased pain with lumbar flexion, extension and rotation.  Positive straight leg raise.  No focal weakness.  She is neurovascularly intact distally.  Specialty Comments:  No specialty comments available.  Imaging: No results found.   PMFS History: Patient Active Problem List   Diagnosis Date Noted  . Arteriosclerosis, mesenteric artery ([place]) 12/31/2018  . Abdominal pain 12/22/2018  . Colitis 12/22/2018  . Osteopenia   . DDD (degenerative disc disease), lumbar 05/11/2016  . Obesity 05/11/2016  . Glucose intolerance (impaired glucose tolerance) 01/12/2016  . Pain in the chest   . Chest pain  01/10/2016  . GERD (gastroesophageal reflux disease) 01/26/2015  . Stress and adjustment reaction 01/26/2015  . Dysuria 11/30/2013  . Carotid artery disease ([place]) 04/20/2013  . Bunion, left 09/21/2012  . Overweight(278.02) 06/04/2012  . Acute bilateral low back pain with right-sided sciatica 04/18/2012  . Essential hypertension, benign 04/02/2012  . Hyperlipidemia 04/02/2012  . Dysphagia 04/02/2012  . RLS (restless legs syndrome) 04/02/2012   Past Medical History:  Diagnosis Date  . Atypical chest pain 05/23/2012   STRESS TEST - small to moderate sized area of partial reversibility of the anteroapical wall, most likely breast artifact, post-stress EF 67%, EKG show NSR at 65, No Lexiscan EKG changes, non-diagnostic for ischemia; STRESS TEST, 01/25/2010 - normal study, post-stress EF 66%, no significant ischemia  . Cerebral atherosclerosis 09/29/2012   CAROTID DUPLEX - RIGHT  BULB/PROXIMAL ICA-moderate amount of fibrous plaque, 50-69% diameter reduction; LEFT CEA-normal, no significant diameter reduction  . Colitis   . GERD (gastroesophageal reflux disease)   . Hyperlipidemia   . Hypertension   . Osteopenia   . Restless leg syndrome   . Shortness of breath 03/30/2005   2D ECHO - EF >55%, normal  . TIA (transient ischemic attack) 01/27/2010   2D ECHO - EF 65%, normal    Family History  Problem Relation Age of Onset  . Heart disease Mother   . Hypertension Mother   . Hypertension Father   . Heart disease Father     Past Surgical History:  Procedure Laterality Date  . ABDOMINAL EXPLORATION SURGERY    . CAROTID ENDARTERECTOMY    . Peripheral Vascular Catheterization  07/16/2005   Right verterbral-50% smooth segmental badsilar artery stenosis on right side, Right carotid-50% ulcerated proxmial stenosis, Left carotid-60 to 70% left externam carotid artery stenosis, 90% proximal left ICA stenosis   Social History   Occupational History  . Not on file  Tobacco Use  . Smoking status:  Former Smoker    Last attempt to quit: 04/21/1983    Years since quitting: 35.9  . Smokeless tobacco: Never Used  Substance and Sexual Activity  . Alcohol use: No  . Drug use: No  . Sexual activity: Not on file

## 2019-04-01 ENCOUNTER — Telehealth: Payer: Self-pay | Admitting: *Deleted

## 2019-04-01 NOTE — Telephone Encounter (Signed)
Please advise 

## 2019-04-01 NOTE — Telephone Encounter (Signed)
Patient advised.

## 2019-04-01 NOTE — Telephone Encounter (Signed)
She has steroids, tramadol and norco.  Continue steroids.  can take either of the others.  If it is still severe, she can always go to ED

## 2019-04-14 ENCOUNTER — Other Ambulatory Visit: Payer: Self-pay

## 2019-04-14 ENCOUNTER — Ambulatory Visit (INDEPENDENT_AMBULATORY_CARE_PROVIDER_SITE_OTHER): Payer: Medicare Other | Admitting: Family Medicine

## 2019-04-14 ENCOUNTER — Encounter: Payer: Self-pay | Admitting: Family Medicine

## 2019-04-14 VITALS — BP 130/82 | HR 78 | Temp 98.5°F | Resp 18 | Ht 66.5 in | Wt 173.4 lb

## 2019-04-14 DIAGNOSIS — M549 Dorsalgia, unspecified: Secondary | ICD-10-CM | POA: Diagnosis not present

## 2019-04-14 DIAGNOSIS — M5136 Other intervertebral disc degeneration, lumbar region: Secondary | ICD-10-CM

## 2019-04-14 NOTE — Progress Notes (Signed)
   Subjective:    Patient ID: Lindsey Mullins, female    DOB: 12-Oct-1946, 73 y.o.   MRN: [id number]  Patient presents for Back Pain (follow up)  Severe back pain, seen by Dr Erlinda Hong after our last visit.  She is seen to have MRI tomorrow.  They can rule out epidural abscess or other cause of her severe back pain.  Of note she did have emergency room visit in the interim as well because of her back pain she was given a shot of Dilaudid this made her very "woozy".  She is also given a prescription for hydrocodone but she does not recall feeling that.  I had given her tramadol she was taking a fourth of the tramadol every 4 hours as needed for pain also taking Tylenol 3 times a day.  Dr. Sherrian Divers actually gave her 3 days of prednisone and gave her Toradol shot in the office back on May 12 but states that this did not help at all.  She is here today to discuss her pain.   Urinating normally, normal BM   Weight down 5lbs, has cut out candy and soda.  Intentionally.     Review Of Systems:  GEN- denies fatigue, fever, weight loss,weakness, recent illness HEENT- denies eye drainage, change in vision, nasal discharge, CVS- denies chest pain, palpitations RESP- denies SOB, cough, wheeze ABD- denies N/V, change in stools, abd pain GU- denies dysuria, hematuria, dribbling, incontinence MSK- + joint pain, muscle aches, injury Neuro- denies headache, dizziness, syncope, seizure activity       Objective:    BP 130/82   Pulse 78   Temp 98.5 F (36.9 C)   Resp 18   Ht 5' 6.5" (1.689 m)   Wt 173 lb 6.4 oz (78.7 kg)   SpO2 98%   BMI 27.57 kg/m  GEN- NAD, alert and oriented x3 CVS- RRR, no murmur RESP-CTAB ABD-NABS,soft,NT,ND MSK- TTP lumbar spine, decreasd ROM LE, antalgic gait  Neuro Strength 4+/5 bilat, sensation int act, normal tone ,+ SLR  EXT- No edema Pulses- Radial, - 2+        Assessment & Plan:      Problem List Items Addressed This Visit      Unprioritized   DDD (degenerative  disc disease), lumbar - Primary    Worsening back pain with radicular symptoms.  She has known degenerative disc disease.  She has an MRI set up for tomorrow.  I recommend that she take half of the tramadol along with acetaminophen to help her pain.  She is very wary of medication which is why she has not been consistent with any of the pain medications.  Steroids did not help.  Muscle relaxants have not helped.       Other Visit Diagnoses    Back pain with radiation          Note: This dictation was prepared with Dragon dictation along with smaller phrase technology. Any transcriptional errors that result from this process are unintentional.

## 2019-04-14 NOTE — Assessment & Plan Note (Signed)
Worsening back pain with radicular symptoms.  She has known degenerative disc disease.  She has an MRI set up for tomorrow.  I recommend that she take half of the tramadol along with acetaminophen to help her pain.  She is very wary of medication which is why she has not been consistent with any of the pain medications.  Steroids did not help.  Muscle relaxants have not helped.

## 2019-04-14 NOTE — Patient Instructions (Addendum)
Take 1/2 tablet of tramadol with tylenol  Get MRI tomorrow  F/U as previous

## 2019-04-15 ENCOUNTER — Ambulatory Visit
Admission: RE | Admit: 2019-04-15 | Discharge: 2019-04-15 | Disposition: A | Discharge status: Home / Self Care | Payer: Medicare Other | Source: Ambulatory Visit | Attending: Orthopaedic Surgery | Admitting: Orthopaedic Surgery

## 2019-04-15 DIAGNOSIS — M5441 Lumbago with sciatica, right side: Secondary | ICD-10-CM

## 2019-04-15 NOTE — Progress Notes (Signed)
Needs f/u appt asap

## 2019-04-22 ENCOUNTER — Other Ambulatory Visit: Payer: Self-pay

## 2019-04-22 ENCOUNTER — Encounter: Payer: Self-pay | Admitting: Orthopaedic Surgery

## 2019-04-22 ENCOUNTER — Ambulatory Visit (INDEPENDENT_AMBULATORY_CARE_PROVIDER_SITE_OTHER): Payer: Medicare Other | Admitting: Orthopaedic Surgery

## 2019-04-22 DIAGNOSIS — M545 Low back pain, unspecified: Secondary | ICD-10-CM

## 2019-04-22 NOTE — Addendum Note (Signed)
Addended by: Precious Bard on: 04/22/2019 02:17 PM   Modules accepted: Orders

## 2019-04-22 NOTE — Progress Notes (Signed)
Office Visit Note   Patient: Lindsey Mullins           Date of Birth: 07-09-1946           MRN: [id number] Visit Date: 04/22/2019              Requested by: [name], MD [address] [place], [place] [address] PCP: [name], MD   Assessment & Plan: Visit Diagnoses:  1. Low back pain, unspecified back pain laterality, unspecified chronicity, unspecified whether sciatica present     Plan: MRI findings are suspicious for metastatic lesions of multiple lumbar vertebral bodies with a small pathologic fracture of L2.  These findings were discussed with the patient and at this time we will need to make an urgent referral to oncology for metastatic disease work-up.  We have provided her with a lumbar corset for support and comfort.  Questions encouraged and answered.  Follow-up as needed.  Follow-Up Instructions: Return if symptoms worsen or fail to improve.   Orders:  No orders of the defined types were placed in this encounter.  No orders of the defined types were placed in this encounter.     Procedures: No procedures performed   Clinical Data: No additional findings.   Subjective: Chief Complaint  Patient presents with  . Lower Back - Pain    [name] returns today for MRI review of her lumbar spine.  Her symptoms are unchanged.  She denies a history of cancer.   Review of Systems   Objective: Vital Signs: There were no vitals taken for this visit.  Physical Exam  Ortho Exam Lumbar spine exam is stable. Specialty Comments:  No specialty comments available.  Imaging: No results found.   PMFS History: Patient Active Problem List   Diagnosis Date Noted  . Arteriosclerosis, mesenteric artery ([facility]) 12/31/2018  . Abdominal pain 12/22/2018  . Colitis 12/22/2018  . Osteopenia   . DDD (degenerative disc disease), lumbar 05/11/2016  . Obesity 05/11/2016  . Glucose intolerance (impaired glucose tolerance) 01/12/2016  . Pain in the chest    . Chest pain 01/10/2016  . GERD (gastroesophageal reflux disease) 01/26/2015  . Stress and adjustment reaction 01/26/2015  . Dysuria 11/30/2013  . Carotid artery disease ([place]) 04/20/2013  . Bunion, left 09/21/2012  . Overweight(278.02) 06/04/2012  . Essential hypertension, benign 04/02/2012  . Hyperlipidemia 04/02/2012  . Dysphagia 04/02/2012  . RLS (restless legs syndrome) 04/02/2012   Past Medical History:  Diagnosis Date  . Atypical chest pain 05/23/2012   STRESS TEST - small to moderate sized area of partial reversibility of the anteroapical wall, most likely breast artifact, post-stress EF 67%, EKG show NSR at 65, No Lexiscan EKG changes, non-diagnostic for ischemia; STRESS TEST, 01/25/2010 - normal study, post-stress EF 66%, no significant ischemia  . Cerebral atherosclerosis 09/29/2012   CAROTID DUPLEX - RIGHT  BULB/PROXIMAL ICA-moderate amount of fibrous plaque, 50-69% diameter reduction; LEFT CEA-normal, no significant diameter reduction  . Colitis   . GERD (gastroesophageal reflux disease)   . Hyperlipidemia   . Hypertension   . Osteopenia   . Restless leg syndrome   . Shortness of breath 03/30/2005   2D ECHO - EF >55%, normal  . TIA (transient ischemic attack) 01/27/2010   2D ECHO - EF 65%, normal    Family History  Problem Relation Age of Onset  . Heart disease Mother   . Hypertension Mother   . Hypertension Father   . Heart disease Father  Past Surgical History:  Procedure Laterality Date  . ABDOMINAL EXPLORATION SURGERY    . CAROTID ENDARTERECTOMY    . Peripheral Vascular Catheterization  07/16/2005   Right verterbral-50% smooth segmental badsilar artery stenosis on right side, Right carotid-50% ulcerated proxmial stenosis, Left carotid-60 to 70% left externam carotid artery stenosis, 90% proximal left ICA stenosis   Social History   Occupational History  . Not on file  Tobacco Use  . Smoking status: Former Smoker    Last attempt to quit: 04/21/1983    Years  since quitting: 36.0  . Smokeless tobacco: Never Used  Substance and Sexual Activity  . Alcohol use: No  . Drug use: No  . Sexual activity: Not on file

## 2019-04-28 ENCOUNTER — Encounter: Payer: Medicare Other | Admitting: Physical Medicine and Rehabilitation

## 2019-04-28 ENCOUNTER — Encounter (HOSPITAL_COMMUNITY): Payer: Self-pay | Admitting: *Deleted

## 2019-04-29 ENCOUNTER — Encounter (HOSPITAL_COMMUNITY): Payer: Self-pay | Admitting: Hematology

## 2019-04-29 ENCOUNTER — Inpatient Hospital Stay (HOSPITAL_COMMUNITY): Payer: Medicare Other

## 2019-04-29 ENCOUNTER — Inpatient Hospital Stay (HOSPITAL_COMMUNITY): Payer: Medicare Other | Attending: Hematology | Admitting: Hematology

## 2019-04-29 ENCOUNTER — Other Ambulatory Visit: Payer: Self-pay

## 2019-04-29 VITALS — BP 190/80 | HR 76 | Temp 97.9°F | Resp 18 | Wt 174.0 lb

## 2019-04-29 DIAGNOSIS — Z87891 Personal history of nicotine dependence: Secondary | ICD-10-CM | POA: Diagnosis not present

## 2019-04-29 DIAGNOSIS — M8580 Other specified disorders of bone density and structure, unspecified site: Secondary | ICD-10-CM | POA: Insufficient documentation

## 2019-04-29 DIAGNOSIS — R0602 Shortness of breath: Secondary | ICD-10-CM | POA: Diagnosis not present

## 2019-04-29 DIAGNOSIS — E785 Hyperlipidemia, unspecified: Secondary | ICD-10-CM | POA: Diagnosis not present

## 2019-04-29 DIAGNOSIS — Z79899 Other long term (current) drug therapy: Secondary | ICD-10-CM | POA: Diagnosis not present

## 2019-04-29 DIAGNOSIS — Z8673 Personal history of transient ischemic attack (TIA), and cerebral infarction without residual deficits: Secondary | ICD-10-CM | POA: Diagnosis not present

## 2019-04-29 DIAGNOSIS — M549 Dorsalgia, unspecified: Secondary | ICD-10-CM | POA: Insufficient documentation

## 2019-04-29 DIAGNOSIS — K219 Gastro-esophageal reflux disease without esophagitis: Secondary | ICD-10-CM | POA: Diagnosis not present

## 2019-04-29 DIAGNOSIS — G8929 Other chronic pain: Secondary | ICD-10-CM | POA: Diagnosis not present

## 2019-04-29 DIAGNOSIS — S32020A Wedge compression fracture of second lumbar vertebra, initial encounter for closed fracture: Secondary | ICD-10-CM | POA: Diagnosis present

## 2019-04-29 DIAGNOSIS — I10 Essential (primary) hypertension: Secondary | ICD-10-CM | POA: Diagnosis not present

## 2019-04-29 DIAGNOSIS — M899 Disorder of bone, unspecified: Secondary | ICD-10-CM | POA: Diagnosis not present

## 2019-04-29 LAB — CBC WITH DIFFERENTIAL/PLATELET
Abs Immature Granulocytes: 0.01 10*3/uL (ref 0.00–0.07)
Basophils Absolute: 0 10*3/uL (ref 0.0–0.1)
Basophils Relative: 1 %
Eosinophils Absolute: 0.3 10*3/uL (ref 0.0–0.5)
Eosinophils Relative: 4 %
HCT: 35 % — ABNORMAL LOW (ref 36.0–46.0)
Hemoglobin: 10.8 g/dL — ABNORMAL LOW (ref 12.0–15.0)
Immature Granulocytes: 0 %
Lymphocytes Relative: 40 %
Lymphs Abs: 2.7 10*3/uL (ref 0.7–4.0)
MCH: 28.8 pg (ref 26.0–34.0)
MCHC: 30.9 g/dL (ref 30.0–36.0)
MCV: 93.3 fL (ref 80.0–100.0)
Monocytes Absolute: 0.7 10*3/uL (ref 0.1–1.0)
Monocytes Relative: 10 %
Neutro Abs: 3.1 10*3/uL (ref 1.7–7.7)
Neutrophils Relative %: 45 %
Platelets: 359 10*3/uL (ref 150–400)
RBC: 3.75 MIL/uL — ABNORMAL LOW (ref 3.87–5.11)
RDW: 14.7 % (ref 11.5–15.5)
WBC: 6.9 10*3/uL (ref 4.0–10.5)
nRBC: 0 % (ref 0.0–0.2)

## 2019-04-29 LAB — COMPREHENSIVE METABOLIC PANEL
ALT: 18 U/L (ref 0–44)
AST: 31 U/L (ref 15–41)
Albumin: 4.3 g/dL (ref 3.5–5.0)
Alkaline Phosphatase: 96 U/L (ref 38–126)
Anion gap: 13 (ref 5–15)
BUN: 9 mg/dL (ref 8–23)
CO2: 23 mmol/L (ref 22–32)
Calcium: 9.3 mg/dL (ref 8.9–10.3)
Chloride: 105 mmol/L (ref 98–111)
Creatinine, Ser: 0.74 mg/dL (ref 0.44–1.00)
GFR calc Af Amer: >60 mL/min (ref >60)
GFR calc non Af Amer: >60 mL/min (ref >60)
Glucose, Bld: 91 mg/dL (ref 70–99)
Potassium: 3.7 mmol/L (ref 3.5–5.1)
Sodium: 141 mmol/L (ref 135–145)
Total Bilirubin: 0.5 mg/dL (ref 0.3–1.2)
Total Protein: 8 g/dL (ref 6.5–8.1)

## 2019-04-29 LAB — LACTATE DEHYDROGENASE: LDH: 304 U/L — ABNORMAL HIGH (ref 98–192)

## 2019-04-29 NOTE — Assessment & Plan Note (Signed)
1.  Suspicious bone lesions: - Patient reported back pain for 4-6 weeks, after a road trip to [place]. - CT of the lumbar spine without contrast on 03/30/2019 shows severe L4-5 facet arthrosis with grade 1 anterolisthesis, moderate left neural foramina stenosis, left greater than right. - She was seen by Dr.Xu of orthopedics.  An MRI of the lumbar spine without contrast was done on 04/15/2019.  Numerous bone lesions involving the lumbar spine and right iliac bone worrisome for metastatic disease/multiple myeloma.  There is also pathological fracture of L2 with minimal compression noted. - CT scan of the abdomen and pelvis in February 2028 did not show any evidence of primary lesion. - She does report 10 to 12 pound weight loss in the last 3 to 6 months, but she was trying to lose weight. -She had colonoscopy many years ago.  She does not report any bleeding per rectum or changes in bowel habits. -She had mammogram in December 2019 which showed asymmetry with questionable subtle architectural distortion in the upper outer left breast.  Six-month follow-up was recommended. - She quit smoking cigarettes 25 years ago.  She smoked 1 pack/day for 15 years. - No family history of malignancies.  She worked in [place] jobs including a [place], [employer] and currently works as a Research officer, political party at school.  No chemical exposure. - I have recommended blood work including SPEP, free light chains, serum immunofixation.  We will also check tumor marker CA 15-3 and a CEA level.  We will check an LDH level along with routine labs. -I have also recommended a whole-body PET CT scan to rule out underlying malignancy. -I will see her back after the scan.

## 2019-04-29 NOTE — Patient Instructions (Addendum)
[place] at [facility] Discharge Instructions  You were seen today by Dr. Delton Coombes. He went over your history, family history, and how you've been feeling lately. He will see you back after your scan for follow up.   Thank you for choosing [place] at [facility] to provide your oncology and hematology care.  To afford each patient quality time with our provider, please arrive at least 15 minutes before your scheduled appointment time.   If you have a lab appointment with the [facility] please come in thru the  Main Entrance and check in at the main information desk  You need to re-schedule your appointment should you arrive 10 or more minutes late.  We strive to give you quality time with our providers, and arriving late affects you and other patients whose appointments are after yours.  Also, if you no show three or more times for appointments you may be dismissed from the clinic at the providers discretion.     Again, thank you for choosing [facility].  Our hope is that these requests will decrease the amount of time that you wait before being seen by our physicians.       _____________________________________________________________  Should you have questions after your visit to [facility], please contact our office at [phone] between the hours of 8:00 a.m. and 4:30 p.m.  Voicemails left after 4:00 p.m. will not be returned until the following business day.  For prescription refill requests, have your pharmacy contact our office and allow 72 hours.    Cancer Center Support Programs:   > Cancer Support Group  2nd Tuesday of the month 1pm-2pm, Journey Room

## 2019-04-29 NOTE — Progress Notes (Signed)
[facility] NOTE  Patient Care Team: [place], [place]ena Nunnery, MD as PCP - General (Family Medicine) Lorretta Harp, MD as Attending Physician (Cardiology)  CHIEF COMPLAINTS/PURPOSE OF CONSULTATION:  Suspicious bone lesions.  HISTORY OF PRESENTING ILLNESS:  Lindsey Mullins 73 y.o. female is seen for further evaluation and management of suspicious bone lesions.  She reports traveling to [facility] [place] about 6 weeks ago.  After that she developed lower back pain.  She had a CT lumbar spine without contrast on 03/30/2019 which showed degenerative disc disease.  She was evaluated by orthopedics and an MRI of the lumbar spine was obtained without contrast on 04/15/2019.  Numerous bone lesions involving the lumbar spine and right iliac bone worrisome for metastatic disease/multiple myeloma.  There is also L2 pathological fracture with minimal compression noted.  Patient does report some lower back pain.  She takes Tylenol which helps it.  She denies any GI symptoms including change in bowel habits or bleeding per rectum.  She has lost about 10 to 12 pounds in the last 6 months but she was trying to lose weight.  She had colonoscopy many years ago.  She had mammogram in December 2019 which showed some abnormality in the left breast warranting six-month follow-up mammogram.  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  Denies any chemicals.  She quit smoking about 25 years ago.  She smoked less than 1 pack/day for 15 years.  No family history of malignancies reported.  MEDICAL HISTORY:  Past Medical History:  Diagnosis Date  . Atypical chest pain 05/23/2012   STRESS TEST - small to moderate sized area of partial reversibility of the anteroapical wall, most likely breast artifact, post-stress EF 67%, EKG show NSR at 65, No Lexiscan EKG changes, non-diagnostic for ischemia; STRESS TEST, 01/25/2010 - normal study, post-stress EF 66%, no  significant ischemia  . Cerebral atherosclerosis 09/29/2012   CAROTID DUPLEX - RIGHT  BULB/PROXIMAL ICA-moderate amount of fibrous plaque, 50-69% diameter reduction; LEFT CEA-normal, no significant diameter reduction  . Colitis   . GERD (gastroesophageal reflux disease)   . Hyperlipidemia   . Hypertension   . Osteopenia   . Restless leg syndrome   . Shortness of breath 03/30/2005   2D ECHO - EF >55%, normal  . TIA (transient ischemic attack) 01/27/2010   2D ECHO - EF 65%, normal    SURGICAL HISTORY: Past Surgical History:  Procedure Laterality Date  . ABDOMINAL EXPLORATION SURGERY    . CAROTID ENDARTERECTOMY    . Peripheral Vascular Catheterization  07/16/2005   Right verterbral-50% smooth segmental badsilar artery stenosis on right side, Right carotid-50% ulcerated proxmial stenosis, Left carotid-60 to 70% left externam carotid artery stenosis, 90% proximal left ICA stenosis    SOCIAL HISTORY: Social History   Socioeconomic History  . Marital status: Divorced    Spouse name: Not on file  . Number of children: 2  . Years of education: Not on file  . Highest education level: Not on file  Occupational History  . Not on file  Social Needs  . Financial resource strain: Not hard at all  . Food insecurity:    Worry: Never true    Inability: Never true  . Transportation needs:    Medical: No    Non-medical: No  Tobacco Use  . Smoking status: Former Smoker    Types: Cigarettes    Last attempt to quit: 04/21/1983    Years  since quitting: 36.0  . Smokeless tobacco: Never Used  Substance and Sexual Activity  . Alcohol use: No  . Drug use: No  . Sexual activity: Not on file  Lifestyle  . Physical activity:    Days per week: 0 days    Minutes per session: 0 min  . Stress: Not at all  Relationships  . Social connections:    Talks on phone: Twice a week    Gets together: Twice a week    Attends religious service: More than 4 times per year    Active member of club or  organization: No    Attends meetings of clubs or organizations: Never    Relationship status: Divorced  . Intimate partner violence:    Fear of current or ex partner: No    Emotionally abused: No    Physically abused: No    Forced sexual activity: No  Other Topics Concern  . Not on file  Social History Narrative  . Not on file    FAMILY HISTORY: Family History  Problem Relation Age of Onset  . Heart disease Mother   . Hypertension Mother   . Hypertension Father   . Heart disease Father     ALLERGIES:  is allergic to penicillins; codeine; lipitor [atorvastatin]; lisinopril; and pravastatin.  MEDICATIONS:  Current Outpatient Medications  Medication Sig Dispense Refill  . acetaminophen (TYLENOL) 325 MG tablet Take 325-650 mg by mouth every 6 (six) hours as needed (for headaches or pain).    [name] aspirin 81 MG tablet Take 81 mg by mouth 2 (two) times daily.     . carvedilol (COREG) 3.125 MG tablet TAKE 1 TABLET BY MOUTH ONCE DAILY (Patient taking differently: Take 3.125 mg by mouth daily. ) 90 tablet 2  . loratadine (CLARITIN) 10 MG tablet Take 10 mg by mouth daily.    [name] losartan (COZAAR) 50 MG tablet TAKE 1/2 (ONE-HALF) TABLET BY MOUTH ONCE DAILY (Patient taking differently: Take 25 mg by mouth daily. ) 30 tablet 8  . Omega-3 Krill Oil 500 MG CAPS Take 500 mg by mouth daily.     . rosuvastatin (CRESTOR) 20 MG tablet Take 1 tablet (20 mg total) by mouth daily. (Patient taking differently: Take 10 mg by mouth daily. ) 90 tablet 3  . tiZANidine (ZANAFLEX) 4 MG tablet Take 1 tablet (4 mg total) by mouth every 6 (six) hours as needed for muscle spasms. (Patient taking differently: Take 2 mg by mouth every 6 (six) hours as needed for muscle spasms. ) 30 tablet 1  . cyclobenzaprine (FLEXERIL) 5 MG tablet TAKE 1 TABLET BY MOUTH AT BEDTIME AS NEEDED FOR MUSCLE SPASM OR  LEG  CRAMPS (Patient not taking: Reported on 04/29/2019) 90 tablet 0  . HYDROcodone-acetaminophen (NORCO) 5-325 MG tablet Take  0.5-1 tablets by mouth every 6 (six) hours as needed. (Patient not taking: Reported on 04/28/2019) 10 tablet 0  . nitroGLYCERIN (NITROSTAT) 0.4 MG SL tablet DISSOLVE ONE TABLET UNDER THE TONGUE AS NEEDED FOR CHEST PAIN**MAX 3 TABLETS EACH 5 MINUTES APART** (Patient not taking: Reported on 04/28/2019) 25 tablet 3  . omeprazole (PRILOSEC) 20 MG capsule Take 1 po daily (Patient not taking: Reported on 04/28/2019) 90 capsule 2   No current facility-administered medications for this visit.     REVIEW OF SYSTEMS:   Constitutional: Denies fevers, chills or abnormal night sweats Eyes: Denies blurriness of vision, double vision or watery eyes Ears, nose, mouth, throat, and face: Denies mucositis or sore throat Respiratory:  Denies cough, dyspnea or wheezes Cardiovascular: Denies palpitation, chest discomfort or lower extremity swelling Gastrointestinal:  Denies nausea, heartburn or change in bowel habits Skin: Denies abnormal skin rashes Lymphatics: Denies new lymphadenopathy or easy bruising Neurological:Denies numbness, tingling or new weaknesses.  Positive for sleep disturbances.  Positive for low back pain. Behavioral/Psych: Mood is stable, no new changes  All other systems were reviewed with the patient and are negative.  PHYSICAL EXAMINATION: ECOG PERFORMANCE STATUS: 1 - Symptomatic but completely ambulatory  Vitals:   04/29/19 1324  BP: (!) 190/80  Pulse: 76  Resp: 18  Temp: 97.9 F (36.6 C)  SpO2: 99%   Filed Weights   04/29/19 1324  Weight: 174 lb (78.9 kg)    GENERAL:alert, no distress and comfortable SKIN: skin color, texture, turgor are normal, no rashes or significant lesions EYES: normal, conjunctiva are pink and non-injected, sclera clear OROPHARYNX:no exudate, no erythema and lips, buccal mucosa, and tongue normal  NECK: supple, thyroid normal size, non-tender, without nodularity LYMPH:  no palpable lymphadenopathy in the cervical, axillary or inguinal LUNGS: clear to  auscultation and percussion with normal breathing effort HEART: regular rate & rhythm and no murmurs and no lower extremity edema ABDOMEN:abdomen soft, non-tender and normal bowel sounds Musculoskeletal:no cyanosis of digits and no clubbing  PSYCH: alert & oriented x 3 with fluent speech NEURO: no focal motor/sensory deficits  LABORATORY DATA:  I have reviewed the data as listed Lab Results  Component Value Date   WBC 6.9 04/29/2019   HGB 10.8 (L) 04/29/2019   HCT 35.0 (L) 04/29/2019   MCV 93.3 04/29/2019   PLT 359 04/29/2019     Chemistry      Component Value Date/Time   NA 141 04/29/2019 1427   K 3.7 04/29/2019 1427   CL 105 04/29/2019 1427   CO2 23 04/29/2019 1427   BUN 9 04/29/2019 1427   CREATININE 0.74 04/29/2019 1427   CREATININE 0.83 12/31/2018 1127      Component Value Date/Time   CALCIUM 9.3 04/29/2019 1427   ALKPHOS 96 04/29/2019 1427   AST 31 04/29/2019 1427   ALT 18 04/29/2019 1427   BILITOT 0.5 04/29/2019 1427       RADIOGRAPHIC STUDIES: I have personally reviewed the radiological images as listed and agreed with the findings in the report. Mr Lumbar Spine W/o Contrast  Result Date: 04/15/2019 CLINICAL DATA:  Chronic back pain worsening recently. No history of cancer. EXAM: MRI LUMBAR SPINE WITHOUT CONTRAST TECHNIQUE: Multiplanar, multisequence MR imaging of the lumbar spine was performed. No intravenous contrast was administered. COMPARISON:  CT lumbar spine 03/30/2019 and CT abdomen/pelvis 12/22/2018 FINDINGS: Segmentation: There are five lumbar type vertebral bodies. The last full intervertebral disc space is labeled L5-S1. This correlates with the recent CT scan. Alignment:  Mild degenerative anterolisthesis of L4. Vertebrae: Multiple bone lesions are demonstrated involving the L1, L2, L3 and L4 vertebral bodies. There is a pathologic fracture of L2 with minimal compression. Smaller lesions are suspected and L5 and S1. A right iliac bone lesion is also  noted. Even in retrospect these are not readily identified on the recent CT scan. Possibilities would include metastatic disease and multiple myeloma. Looking back at the prior abdominal and pelvic CT scan from February I do not see any obvious primary lesion. Conus medullaris and cauda equina: Conus extends to the T12-L1 level. Conus and cauda equina appear normal. Paraspinal and other soft tissues: No paraspinal tumor or adenopathy is identified. Disc levels: L1-2: No disc  protrusions, spinal or foraminal stenosis. The canal is quite generous. Incidental lipoma of the filum terminalis. L2-3: No significant findings. L3-4: No significant findings. L4-5: Degenerative anterolisthesis of L4 with a bulging uncovered disc with mild flattening of the ventral thecal sac and mild bilateral lateral recess encroachment. There is moderate to advanced facet disease. Mild left-sided foraminal narrowing is also noted. L5-S1: Moderate facet disease but no disc protrusions, spinal or foraminal stenosis. IMPRESSION: 1. Numerous bone lesions involving the lumbar spine and right iliac bone worrisome for metastatic disease or multiple myeloma. No obvious primary lesion is seen on the recent CT scan from February 2020. Patient needs a metastatic workup. Recommend oncology consultation. 2. Pathologic fracture of L2 with minimal compression. 3. Degenerative anterolisthesis of L4 with a bulging uncovered disc at L4-5. This in combination with advanced facet disease contributes to mild bilateral lateral recess encroachment and mild left foraminal narrowing. These results will be called to the ordering clinician or representative by the Radiologist Assistant, and communication documented in the PACS or zVision Dashboard. Electronically Signed   By: Marijo Sanes M.D.   On: 04/15/2019 18:01    ASSESSMENT & PLAN:  Compression fracture of L2 ([place]) 1.  Suspicious bone lesions: - Patient reported back pain for 4-6 weeks, after a road trip  to [place]. - CT of the lumbar spine without contrast on 03/30/2019 shows severe L4-5 facet arthrosis with grade 1 anterolisthesis, moderate left neural foramina stenosis, left greater than right. - She was seen by Dr.Xu of orthopedics.  An MRI of the lumbar spine without contrast was done on 04/15/2019.  Numerous bone lesions involving the lumbar spine and right iliac bone worrisome for metastatic disease/multiple myeloma.  There is also pathological fracture of L2 with minimal compression noted. - CT scan of the abdomen and pelvis in February 2028 did not show any evidence of primary lesion. - She does report 10 to 12 pound weight loss in the last 3 to 6 months, but she was trying to lose weight. -She had colonoscopy many years ago.  She does not report any bleeding per rectum or changes in bowel habits. -She had mammogram in December 2019 which showed asymmetry with questionable subtle architectural distortion in the upper outer left breast.  Six-month follow-up was recommended. - She quit smoking cigarettes 25 years ago.  She smoked 1 pack/day for 15 years. - No family history of malignancies.  She worked in [place] jobs including a [place], [employer] and currently works as a Research officer, political party at school.  No chemical exposure. - I have recommended blood work including SPEP, free light chains, serum immunofixation.  We will also check tumor marker CA 15-3 and a CEA level.  We will check an LDH level along with routine labs. -I have also recommended a whole-body PET CT scan to rule out underlying malignancy. -I will see her back after the scan.  Orders Placed This Encounter  Procedures  . NM PET Image Initial (PI) Skull Base To Thigh    Standing Status:   Future    Standing Expiration Date:   04/28/2020    Order Specific Question:   If indicated for the ordered procedure, I authorize the administration of a radiopharmaceutical per Radiology protocol    Answer:   Yes    Order Specific  Question:   Preferred imaging location?    Answer:   [facility]    Order Specific Question:   Radiology Contrast Protocol - do NOT remove file path  Answer:   _[email or web address]  . CBC with Differential/Platelet    Standing Status:   Future    Number of Occurrences:   1    Standing Expiration Date:   04/28/2020  . Comprehensive metabolic panel    Standing Status:   Future    Number of Occurrences:   1    Standing Expiration Date:   04/28/2020  . Protein electrophoresis, serum    Standing Status:   Future    Number of Occurrences:   1    Standing Expiration Date:   04/28/2020  . Kappa/lambda light chains    Standing Status:   Future    Number of Occurrences:   1    Standing Expiration Date:   04/28/2020  . Lactate dehydrogenase    Standing Status:   Future    Number of Occurrences:   1    Standing Expiration Date:   04/28/2020  . CEA    Standing Status:   Future    Number of Occurrences:   1    Standing Expiration Date:   04/28/2020  . Cancer antigen 15-3    Standing Status:   Future    Number of Occurrences:   1    Standing Expiration Date:   04/28/2020  . Beta 2 microglobuline, serum    Standing Status:   Future    Number of Occurrences:   1    Standing Expiration Date:   04/28/2020  . Immunofixation electrophoresis    Standing Status:   Future    Number of Occurrences:   1    Standing Expiration Date:   04/28/2020    All questions were answered. The patient knows to call the clinic with any problems, questions or concerns.      Derek Jack, MD 04/29/2019 6:39 PM

## 2019-04-30 LAB — PROTEIN ELECTROPHORESIS, SERUM
A/G Ratio: 1.1 (ref 0.7–1.7)
Albumin ELP: 4 g/dL (ref 2.9–4.4)
Alpha-1-Globulin: 0.3 g/dL (ref 0.0–0.4)
Alpha-2-Globulin: 0.8 g/dL (ref 0.4–1.0)
Beta Globulin: 1.4 g/dL — ABNORMAL HIGH (ref 0.7–1.3)
Gamma Globulin: 1.4 g/dL (ref 0.4–1.8)
Globulin, Total: 3.8 g/dL (ref 2.2–3.9)
Total Protein ELP: 7.8 g/dL (ref 6.0–8.5)

## 2019-04-30 LAB — KAPPA/LAMBDA LIGHT CHAINS
Kappa free light chain: 25.2 mg/L — ABNORMAL HIGH (ref 3.3–19.4)
Kappa, lambda light chain ratio: 0.96 (ref 0.26–1.65)
Lambda free light chains: 26.2 mg/L (ref 5.7–26.3)

## 2019-04-30 LAB — CANCER ANTIGEN 15-3: CA 15-3: 89.9 U/mL — ABNORMAL HIGH (ref 0.0–25.0)

## 2019-04-30 LAB — BETA 2 MICROGLOBULIN, SERUM: Beta-2 Microglobulin: 2.5 mg/L — ABNORMAL HIGH (ref 0.6–2.4)

## 2019-04-30 LAB — CEA: CEA: 2.1 ng/mL (ref 0.0–4.7)

## 2019-05-01 ENCOUNTER — Other Ambulatory Visit: Payer: Self-pay

## 2019-05-01 ENCOUNTER — Ambulatory Visit
Admission: RE | Admit: 2019-05-01 | Discharge: 2019-05-01 | Disposition: A | Discharge status: Home / Self Care | Payer: Medicare Other | Source: Ambulatory Visit | Attending: Family Medicine | Admitting: Family Medicine

## 2019-05-01 ENCOUNTER — Other Ambulatory Visit: Payer: Self-pay | Admitting: Family Medicine

## 2019-05-01 DIAGNOSIS — N6489 Other specified disorders of breast: Secondary | ICD-10-CM

## 2019-05-01 DIAGNOSIS — R921 Mammographic calcification found on diagnostic imaging of breast: Secondary | ICD-10-CM

## 2019-05-01 LAB — IMMUNOFIXATION ELECTROPHORESIS
IgA: 415 mg/dL (ref 64–422)
IgG (Immunoglobin G), Serum: 1395 mg/dL (ref 586–1602)
IgM (Immunoglobulin M), Srm: 48 mg/dL (ref 26–217)
Total Protein ELP: 7.8 g/dL (ref 6.0–8.5)

## 2019-05-07 ENCOUNTER — Inpatient Hospital Stay: Admission: RE | Admit: 2019-05-07 | Payer: Medicare Other | Source: Ambulatory Visit

## 2019-05-08 ENCOUNTER — Other Ambulatory Visit: Payer: Self-pay

## 2019-05-08 ENCOUNTER — Ambulatory Visit (HOSPITAL_COMMUNITY)
Admission: RE | Admit: 2019-05-08 | Discharge: 2019-05-08 | Disposition: A | Discharge status: Home / Self Care | Payer: Medicare Other | Source: Ambulatory Visit | Attending: Hematology | Admitting: Hematology

## 2019-05-08 ENCOUNTER — Encounter (HOSPITAL_COMMUNITY): Payer: Medicare Other

## 2019-05-08 ENCOUNTER — Other Ambulatory Visit (HOSPITAL_COMMUNITY): Payer: Self-pay | Admitting: Hematology

## 2019-05-08 DIAGNOSIS — I10 Essential (primary) hypertension: Secondary | ICD-10-CM | POA: Diagnosis not present

## 2019-05-08 DIAGNOSIS — Z8673 Personal history of transient ischemic attack (TIA), and cerebral infarction without residual deficits: Secondary | ICD-10-CM | POA: Diagnosis not present

## 2019-05-08 DIAGNOSIS — Z79899 Other long term (current) drug therapy: Secondary | ICD-10-CM | POA: Insufficient documentation

## 2019-05-08 DIAGNOSIS — Z7982 Long term (current) use of aspirin: Secondary | ICD-10-CM | POA: Insufficient documentation

## 2019-05-08 DIAGNOSIS — S32020A Wedge compression fracture of second lumbar vertebra, initial encounter for closed fracture: Secondary | ICD-10-CM

## 2019-05-08 DIAGNOSIS — C7889 Secondary malignant neoplasm of other digestive organs: Secondary | ICD-10-CM | POA: Diagnosis not present

## 2019-05-08 DIAGNOSIS — X58XXXA Exposure to other specified factors, initial encounter: Secondary | ICD-10-CM | POA: Insufficient documentation

## 2019-05-08 DIAGNOSIS — K219 Gastro-esophageal reflux disease without esophagitis: Secondary | ICD-10-CM | POA: Insufficient documentation

## 2019-05-08 DIAGNOSIS — R591 Generalized enlarged lymph nodes: Secondary | ICD-10-CM | POA: Diagnosis not present

## 2019-05-08 DIAGNOSIS — R911 Solitary pulmonary nodule: Secondary | ICD-10-CM | POA: Diagnosis not present

## 2019-05-08 DIAGNOSIS — C7951 Secondary malignant neoplasm of bone: Secondary | ICD-10-CM | POA: Diagnosis not present

## 2019-05-08 DIAGNOSIS — Z87891 Personal history of nicotine dependence: Secondary | ICD-10-CM | POA: Diagnosis not present

## 2019-05-08 DIAGNOSIS — E785 Hyperlipidemia, unspecified: Secondary | ICD-10-CM | POA: Insufficient documentation

## 2019-05-08 LAB — GLUCOSE, CAPILLARY: Glucose-Capillary: 78 mg/dL (ref 70–99)

## 2019-05-08 MED ORDER — FLUDEOXYGLUCOSE F - 18 (FDG) INJECTION: 8.6000 | Freq: Once | INTRAVENOUS | Status: DC | PRN

## 2019-05-08 MED ORDER — FLUDEOXYGLUCOSE F - 18 (FDG) INJECTION
8.6900 | Freq: Once | INTRAVENOUS | Status: AC | PRN
  Administered 2019-05-08: 8.69 via INTRAVENOUS

## 2019-05-11 ENCOUNTER — Inpatient Hospital Stay (HOSPITAL_COMMUNITY): Payer: Medicare Other | Admitting: Hematology

## 2019-05-11 ENCOUNTER — Encounter (HOSPITAL_COMMUNITY): Payer: Self-pay | Admitting: Hematology

## 2019-05-11 ENCOUNTER — Other Ambulatory Visit: Payer: Self-pay

## 2019-05-11 VITALS — BP 154/80 | HR 83 | Temp 97.9°F | Resp 18 | Wt 173.6 lb

## 2019-05-11 DIAGNOSIS — S32020A Wedge compression fracture of second lumbar vertebra, initial encounter for closed fracture: Secondary | ICD-10-CM

## 2019-05-11 DIAGNOSIS — G8929 Other chronic pain: Secondary | ICD-10-CM | POA: Diagnosis not present

## 2019-05-11 DIAGNOSIS — K219 Gastro-esophageal reflux disease without esophagitis: Secondary | ICD-10-CM

## 2019-05-11 DIAGNOSIS — Z8673 Personal history of transient ischemic attack (TIA), and cerebral infarction without residual deficits: Secondary | ICD-10-CM

## 2019-05-11 DIAGNOSIS — Z87891 Personal history of nicotine dependence: Secondary | ICD-10-CM

## 2019-05-11 DIAGNOSIS — Z79899 Other long term (current) drug therapy: Secondary | ICD-10-CM

## 2019-05-11 DIAGNOSIS — M8580 Other specified disorders of bone density and structure, unspecified site: Secondary | ICD-10-CM

## 2019-05-11 DIAGNOSIS — R0602 Shortness of breath: Secondary | ICD-10-CM

## 2019-05-11 DIAGNOSIS — M899 Disorder of bone, unspecified: Secondary | ICD-10-CM

## 2019-05-11 DIAGNOSIS — E785 Hyperlipidemia, unspecified: Secondary | ICD-10-CM

## 2019-05-11 DIAGNOSIS — I10 Essential (primary) hypertension: Secondary | ICD-10-CM

## 2019-05-11 MED ORDER — LIDOCAINE 5 % EX PTCH: 1.0000 | MEDICATED_PATCH | CUTANEOUS | 0 refills | Status: DC

## 2019-05-11 NOTE — Progress Notes (Signed)
Lindsey Mullins, Lindsey Mullins [id number]   CLINIC:  Medical Oncology/Hematology  PCP:  Lindsey Rossetti, MD [address] [place] [address] [phone]   REASON FOR VISIT: work up   INTERVAL HISTORY:  [name] 73 y.o. female returns for routine follow-up for the work up for her back pain. She is here complaining of back pain. She reports she has tried every medication and they either make her sick or make her head feel funny. She is not wanting to take them. She is taking tylenol every 3 hours for her pain. Denies any nausea, vomiting, or diarrhea. Denies any new pains. Had not noticed any recent bleeding such as epistaxis, hematuria or hematochezia. Denies recent chest pain on exertion, shortness of breath on minimal exertion, pre-syncopal episodes, or palpitations. Denies any numbness or tingling in hands or feet. Denies any recent fevers, infections, or recent hospitalizations. Patient reports appetite at 25% and energy level at 0%. She is eating well and maintaining her weight at this time.    REVIEW OF SYSTEMS:  Review of Systems  Musculoskeletal: Positive for back pain.  Psychiatric/Behavioral: Positive for sleep disturbance.  All other systems reviewed and are negative.    PAST MEDICAL/SURGICAL HISTORY:  Past Medical History:  Diagnosis Date  . Atypical chest pain 05/23/2012   STRESS TEST - small to moderate sized area of partial reversibility of the anteroapical wall, most likely breast artifact, post-stress EF 67%, EKG show NSR at 65, No Lexiscan EKG changes, non-diagnostic for ischemia; STRESS TEST, 01/25/2010 - normal study, post-stress EF 66%, no significant ischemia  . Cerebral atherosclerosis 09/29/2012   CAROTID DUPLEX - RIGHT  BULB/PROXIMAL ICA-moderate amount of fibrous plaque, 50-69% diameter reduction; LEFT CEA-normal, no significant diameter reduction  . Colitis   . GERD (gastroesophageal reflux disease)   .  Hyperlipidemia   . Hypertension   . Osteopenia   . Restless leg syndrome   . Shortness of breath 03/30/2005   2D ECHO - EF >55%, normal  . TIA (transient ischemic attack) 01/27/2010   2D ECHO - EF 65%, normal   Past Surgical History:  Procedure Laterality Date  . ABDOMINAL EXPLORATION SURGERY    . CAROTID ENDARTERECTOMY    . Peripheral Vascular Catheterization  07/16/2005   Right verterbral-50% smooth segmental badsilar artery stenosis on right side, Right carotid-50% ulcerated proxmial stenosis, Left carotid-60 to 70% left externam carotid artery stenosis, 90% proximal left ICA stenosis     SOCIAL HISTORY:  Social History   Socioeconomic History  . Marital status: Divorced    Spouse name: Not on file  . Number of children: 2  . Years of education: Not on file  . Highest education level: Not on file  Occupational History  . Not on file  Social Needs  . Financial resource strain: Not hard at all  . Food insecurity    Worry: Never true    Inability: Never true  . Transportation needs    Medical: No    Non-medical: No  Tobacco Use  . Smoking status: Former Smoker    Types: Cigarettes    Quit date: 04/21/1983    Years since quitting: 36.0  . Smokeless tobacco: Never Used  Substance and Sexual Activity  . Alcohol use: No  . Drug use: No  . Sexual activity: Not on file  Lifestyle  . Physical activity    Days per week: 0 days    Minutes per  session: 0 min  . Stress: Not at all  Relationships  . Social Herbalist on phone: Twice a week    Gets together: Twice a week    Attends religious service: More than 4 times per year    Active member of club or organization: No    Attends meetings of clubs or organizations: Never    Relationship status: Divorced  . Intimate partner violence    Fear of current or ex partner: No    Emotionally abused: No    Physically abused: No    Forced sexual activity: No  Other Topics Concern  . Not on file  Social History  Narrative  . Not on file    FAMILY HISTORY:  Family History  Problem Relation Age of Onset  . Heart disease Mother   . Hypertension Mother   . Hypertension Father   . Heart disease Father     CURRENT MEDICATIONS:  Outpatient Encounter Medications as of 05/11/2019  Medication Sig  . acetaminophen (TYLENOL) 325 MG tablet Take 325-650 mg by mouth every 6 (six) hours as needed (for headaches or pain).  [name] aspirin 81 MG tablet Take 81 mg by mouth 2 (two) times daily.   . carvedilol (COREG) 3.125 MG tablet TAKE 1 TABLET BY MOUTH ONCE DAILY (Patient taking differently: Take 3.125 mg by mouth daily. )  . loratadine (CLARITIN) 10 MG tablet Take 10 mg by mouth daily.  [name] losartan (COZAAR) 50 MG tablet TAKE 1/2 (ONE-HALF) TABLET BY MOUTH ONCE DAILY (Patient taking differently: Take 25 mg by mouth daily. )  . Omega-3 Krill Oil 500 MG CAPS Take 500 mg by mouth daily.   [name] tiZANidine (ZANAFLEX) 4 MG tablet Take 1 tablet (4 mg total) by mouth every 6 (six) hours as needed for muscle spasms. (Patient taking differently: Take 2 mg by mouth every 6 (six) hours as needed for muscle spasms. )  . cyclobenzaprine (FLEXERIL) 5 MG tablet TAKE 1 TABLET BY MOUTH AT BEDTIME AS NEEDED FOR MUSCLE SPASM OR  LEG  CRAMPS (Patient not taking: Reported on 04/29/2019)  . HYDROcodone-acetaminophen (NORCO) 5-325 MG tablet Take 0.5-1 tablets by mouth every 6 (six) hours as needed. (Patient not taking: Reported on 04/28/2019)  . lidocaine (LIDODERM) 5 % Place 1 patch onto the skin daily. Remove & Discard patch within 12 hours or as directed by MD  . nitroGLYCERIN (NITROSTAT) 0.4 MG SL tablet DISSOLVE ONE TABLET UNDER THE TONGUE AS NEEDED FOR CHEST PAIN**MAX 3 TABLETS EACH 5 MINUTES APART** (Patient not taking: Reported on 04/28/2019)  . omeprazole (PRILOSEC) 20 MG capsule Take 1 po daily (Patient not taking: Reported on 04/28/2019)  . rosuvastatin (CRESTOR) 20 MG tablet Take 1 tablet (20 mg total) by mouth daily. (Patient not taking:  Reported on 05/11/2019)   Facility-Administered Encounter Medications as of 05/11/2019  Medication  . fludeoxyglucose F - 18 (FDG) injection 8.6 millicurie    ALLERGIES:  Allergies  Allergen Reactions  . Penicillins Shortness Of Breath and Other (See Comments)    Has patient had a PCN reaction causing immediate rash, facial/tongue/throat swelling, SOB or lightheadedness with hypotension: Yes Has patient had a PCN reaction causing severe rash involving mucus membranes or skin necrosis: Unk Has patient had a PCN reaction that required hospitalization: Unk Has patient had a PCN reaction occurring within the last 10 years: Yes "States that she was in ICU after being administered"   . Codeine Nausea And Vomiting  . Lipitor [Atorvastatin] Other (  See Comments)    Myalgias  . Lisinopril Cough  . Pravastatin Other (See Comments)    Myalgias     PHYSICAL EXAM:  ECOG Performance status: 1  Vitals:   05/11/19 1001  BP: (!) 154/80  Pulse: 83  Resp: 18  Temp: 97.9 F (36.6 C)  SpO2: 100%   Filed Weights   05/11/19 1001  Weight: 173 lb 9.6 oz (78.7 kg)    Physical Exam Constitutional:      Appearance: Normal appearance. She is normal weight.  Cardiovascular:     Rate and Rhythm: Normal rate and regular rhythm.     Heart sounds: Normal heart sounds.  Pulmonary:     Effort: Pulmonary effort is normal.     Breath sounds: Normal breath sounds.  Abdominal:     General: Bowel sounds are normal.     Palpations: Abdomen is soft.  Musculoskeletal: Normal range of motion.  Skin:    General: Skin is warm and dry.  Neurological:     Mental Status: She is alert and oriented to person, place, and time. Mental status is at baseline.  Psychiatric:        Mood and Affect: Mood normal.        Behavior: Behavior normal.        Thought Content: Thought content normal.        Judgment: Judgment normal.      LABORATORY DATA:  I have reviewed the labs as listed.  CBC    Component  Value Date/Time   WBC 6.9 04/29/2019 1427   RBC 3.75 (L) 04/29/2019 1427   HGB 10.8 (L) 04/29/2019 1427   HCT 35.0 (L) 04/29/2019 1427   PLT 359 04/29/2019 1427   MCV 93.3 04/29/2019 1427   MCH 28.8 04/29/2019 1427   MCHC 30.9 04/29/2019 1427   RDW 14.7 04/29/2019 1427   LYMPHSABS 2.7 04/29/2019 1427   MONOABS 0.7 04/29/2019 1427   EOSABS 0.3 04/29/2019 1427   BASOSABS 0.0 04/29/2019 1427   CMP Latest Ref Rng & Units 04/29/2019 12/31/2018 12/23/2018  Glucose 70 - 99 mg/dL 91 62(L) 93  BUN 8 - 23 mg/dL 9 10 7(L)  Creatinine 0.44 - 1.00 mg/dL 0.74 0.83 0.89  Sodium 135 - 145 mmol/L 141 141 142  Potassium 3.5 - 5.1 mmol/L 3.7 3.9 3.6  Chloride 98 - 111 mmol/L 105 108 109  CO2 22 - 32 mmol/L '23 21 22  ' Calcium 8.9 - 10.3 mg/dL 9.3 9.4 8.6(L)  Total Protein 6.5 - 8.1 g/dL 8.0 7.0 -  Total Bilirubin 0.3 - 1.2 mg/dL 0.5 0.4 -  Alkaline Phos 38 - 126 U/L 96 - -  AST 15 - 41 U/L 31 35 -  ALT 0 - 44 U/L 18 19 -       DIAGNOSTIC IMAGING:  I have independently reviewed the scans and discussed with the patient.   I have reviewed Francene Finders, NP's note and agree with the documentation.  I personally performed a face-to-face visit, made revisions and my assessment and plan is as follows.    ASSESSMENT & PLAN:   Compression fracture of L2 ([place]) 1.  Metastatic cancer: - She developed lower back pain a 4 to 6-week duration.  MRI of the lumbar spine without contrast on 04/15/2019 showed numerous bone lesions involving the lumbar spine, right iliac bone worrisome for metastatic disease/multiple myeloma. - She reports 10 to 12 pound weight loss in the last 3 to 6 months, but she was trying to  lose weight. - She quit smoking cigarettes 25 years ago, smoked 1 pack/day for 15 years. - We reviewed results of the PET scan dated 05/08/2019 which shows intensely hypermetabolic right hilar, mediastinal, right supraclavicular adenopathy consistent with metastatic adenopathy.  Solitary small  hypermetabolic nodule within the right lung may represent primary bronchogenic carcinoma.  Focal activity in the right posterior oropharynx would be less likely primary.  No evidence of breast primary.  Widespread hypermetabolic skeletal metastasis involving the cervical thoracic and lumbar spine as well as the pelvis.  Solitary splenic metastasis. - Multiple myeloma work-up was negative.  LDH is elevated. - Differential diagnosis includes lymphoma.  I had a prolonged discussion with the patient about the findings on the PET CT scan.  I will also call her daughter to talk to her.  I have recommended excisional biopsy of the right supraclavicular lymph node.  We have made a referral to Dr. Arnoldo Morale.  2.  Low back pain: - Patient currently taking Tylenol for 100 mg every 3 hours which helps with the pain. -She has tried tramadol and hydrocodone in the past which made her confused.  She does not want to try them anymore. - I will start her on lidocaine patch, 12 hours on/12 hours off to see if it helps.     Total time spent is 40 minutes with more than 50% of the time spent face-to-face discussing scan results, differential diagnosis, counseling and coordination of care.  Orders placed this encounter:  No orders of the defined types were placed in this encounter.     Derek Jack, MD [place] [phone]

## 2019-05-11 NOTE — Patient Instructions (Signed)
[place] at [facility] Discharge Instructions  Follow up after biopsy    Thank you for choosing [place] at [facility] to provide your oncology and hematology care.  To afford each patient quality time with our provider, please arrive at least 15 minutes before your scheduled appointment time.   If you have a lab appointment with the Portal please come in thru the  Main Entrance and check in at the main information desk  You need to re-schedule your appointment should you arrive 10 or more minutes late.  We strive to give you quality time with our providers, and arriving late affects you and other patients whose appointments are after yours.  Also, if you no show three or more times for appointments you may be dismissed from the clinic at the providers discretion.     Again, thank you for choosing [facility].  Our hope is that these requests will decrease the amount of time that you wait before being seen by our physicians.       _____________________________________________________________  Should you have questions after your visit to [facility], please contact our office at [phone] between the hours of 8:00 a.m. and 4:30 p.m.  Voicemails left after 4:00 p.m. will not be returned until the following business day.  For prescription refill requests, have your pharmacy contact our office and allow 72 hours.    Cancer Center Support Programs:   > Cancer Support Group  2nd Tuesday of the month 1pm-2pm, Journey Room

## 2019-05-11 NOTE — Progress Notes (Signed)
Oncology Navigator Note:  I met with patient during office visit with Dr. Delton Coombes. I provided her with my contact information and explained how I am involved in her care.  I talked with patient about the plan for biopsy and the role it will play in her treatment.  I explained the need for tissue diagnosis.  She is hesitant but agrees to proceed. She was encouraged to call me should she have any questions or concerns that arise or if her daughter has any questions.  She verbalizes appreciation and understanding.

## 2019-05-11 NOTE — Assessment & Plan Note (Signed)
1.  Metastatic cancer: - She developed lower back pain a 4 to 6-week duration.  MRI of the lumbar spine without contrast on 04/15/2019 showed numerous bone lesions involving the lumbar spine, right iliac bone worrisome for metastatic disease/multiple myeloma. - She reports 10 to 12 pound weight loss in the last 3 to 6 months, but she was trying to lose weight. - She quit smoking cigarettes 25 years ago, smoked 1 pack/day for 15 years. - We reviewed results of the PET scan dated 05/08/2019 which shows intensely hypermetabolic right hilar, mediastinal, right supraclavicular adenopathy consistent with metastatic adenopathy.  Solitary small hypermetabolic nodule within the right lung may represent primary bronchogenic carcinoma.  Focal activity in the right posterior oropharynx would be less likely primary.  No evidence of breast primary.  Widespread hypermetabolic skeletal metastasis involving the cervical thoracic and lumbar spine as well as the pelvis.  Solitary splenic metastasis. - Multiple myeloma work-up was negative.  LDH is elevated. - Differential diagnosis includes lymphoma.  I had a prolonged discussion with the patient about the findings on the PET CT scan.  I will also call her daughter to talk to her.  I have recommended excisional biopsy of the right supraclavicular lymph node.  We have made a referral to Dr. Arnoldo Morale.  2.  Low back pain: - Patient currently taking Tylenol for 100 mg every 3 hours which helps with the pain. -She has tried tramadol and hydrocodone in the past which made her confused.  She does not want to try them anymore. - I will start her on lidocaine patch, 12 hours on/12 hours off to see if it helps.

## 2019-05-12 ENCOUNTER — Encounter: Payer: Self-pay | Admitting: Family Medicine

## 2019-05-12 ENCOUNTER — Ambulatory Visit: Payer: Medicare Other | Admitting: Family Medicine

## 2019-05-12 VITALS — BP 142/82 | HR 88 | Temp 98.7°F | Resp 14 | Ht 66.5 in | Wt 174.0 lb

## 2019-05-12 DIAGNOSIS — R948 Abnormal results of function studies of other organs and systems: Secondary | ICD-10-CM

## 2019-05-12 DIAGNOSIS — S32020A Wedge compression fracture of second lumbar vertebra, initial encounter for closed fracture: Secondary | ICD-10-CM

## 2019-05-12 NOTE — Progress Notes (Signed)
Subjective:    Patient ID: Lindsey Mullins, female    DOB: 1946/10/03, 73 y.o.   MRN: [id number]  Patient presents for Discuss Testing   Pt here to f/u PET scan upcoming biopsy.  She wanted me to go over the information with her.  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  She has known degenerative disc in her lumbar spine.  She went to orthopedics about a month ago with worsening back pain when she came back from [place].  Long story short her MRI revealed lytic lesions and compression fracture in the bones therefore prompting oncology work-up.  She also had a mammogram around the same time which was concerning which they also recommended biopsy.  She was seen by oncology she had negative work-up for multiple myeloma but did have elevated Ca1 25.  PET scan was obtained which showed multiple metastatic lesions unknown primary but possible lung cancer she does have history of smoking in the past.  Other possibility lymphoma less likely breast cancer as it did not show up on the PET scan.  She is upset that all of a sudden the past few weeks all of this has shown up on her body she is felt fine besides her back pain.  She had been intentionally trying to lose weight and has not had any B symptoms.  She is actually quite skeptical that all of this is actually going on in her imaging is correct.  She has been weighing back and forth about going to get the biopsy or just leaving things as be because she feels fine.  Her daughter who is a Marine scientist down in [place] does want her to have the biopsy done and is already contacted general surgery about this.  She of course is skeptical about the surgeon as well because she does not know him.  We reviewed all of her imaging as well as some of the labs.     Review Of Systems:  GEN- denies fatigue, fever, weight loss,weakness, recent illness HEENT- denies eye drainage, change in vision, nasal  discharge, CVS- denies chest pain, palpitations RESP- denies SOB, cough, wheeze ABD- denies N/V, change in stools, abd pain GU- denies dysuria, hematuria, dribbling, incontinence MSK- denies joint pain, muscle aches, injury Neuro- denies headache, dizziness, syncope, seizure activity       Objective:    BP (!) 142/82   Pulse 88   Temp 98.7 F (37.1 C) (Oral)   Resp 14   Ht 5' 6.5" (1.689 m)   Wt 174 lb (78.9 kg)   SpO2 97%   BMI 27.66 kg/m  GEN- NAD, alert and oriented x3 Neck- Supple, no palpable supraclavicular nodes CVS- RRR, no murmur RESP-CTAB        Assessment & Plan:      Problem List Items Addressed This Visit      Unprioritized   Compression fracture of L2 (HCC) - Primary    Abnormal PET scan with compression fracture lytic lesions in the bone.  Concerned that the primary may be long but unclear she has not had excisional biopsy of the node.  Discussed that which noted they want to take out and how this would give her more information to knowing what type of cancer she has or what the cause of the abnormalities on the PET scan are from.  This will give her more information about making decision  if she wants treatment or not.  She still doing a little this over but thinks that she will go ahead and proceed a consultation with the surgeon at least on Thursday.  Approximately 35 minutes spent with patient greater than 50% spent on review of her recent imaging as well as her consultations with other specialist        Other Visit Diagnoses    Abnormal positron emission tomography (PET) scan          Note: This dictation was prepared with Dragon dictation along with smaller phrase technology. Any transcriptional errors that result from this process are unintentional.

## 2019-05-12 NOTE — Assessment & Plan Note (Signed)
Abnormal PET scan with compression fracture lytic lesions in the bone.  Concerned that the primary may be long but unclear she has not had excisional biopsy of the node.  Discussed that which noted they want to take out and how this would give her more information to knowing what type of cancer she has or what the cause of the abnormalities on the PET scan are from.  This will give her more information about making decision if she wants treatment or not.  She still doing a little this over but thinks that she will go ahead and proceed a consultation with the surgeon at least on Thursday.  Approximately 35 minutes spent with patient greater than 50% spent on review of her recent imaging as well as her consultations with other specialist

## 2019-05-12 NOTE — Patient Instructions (Addendum)
[address]  [place] [address]   [phone]  (F) [phone]    DR.Arnoldo Morale  For Biopsy    F/U as needed

## 2019-05-14 ENCOUNTER — Encounter: Payer: Self-pay | Admitting: General Surgery

## 2019-05-14 ENCOUNTER — Ambulatory Visit: Payer: Medicare Other | Admitting: General Surgery

## 2019-05-14 ENCOUNTER — Other Ambulatory Visit: Payer: Self-pay

## 2019-05-14 VITALS — BP 163/85 | HR 77 | Temp 96.8°F | Resp 16 | Ht 66.5 in | Wt 173.0 lb

## 2019-05-14 DIAGNOSIS — R591 Generalized enlarged lymph nodes: Secondary | ICD-10-CM

## 2019-05-14 NOTE — Progress Notes (Signed)
Lindsey Mullins; [id number]; 09/27/1946   HPI Patient is a 73 year old black female who was referred to my care by Dr. Delton Coombes for a lymph node biopsy.  Patient was recently found to have a compression fracture in the back secondary to a lytic bone lesion.  She underwent a PET scan which reveals generalized lymphadenopathy.  There is also suggestion of a right lung carcinoma.  She also has a lesion in the left breast on mammogram.  She has been referred for a lymph node biopsy of the right supraclavicular region.  Complains of back pain. Past Medical History:  Diagnosis Date  . Atypical chest pain 05/23/2012   STRESS TEST - small to moderate sized area of partial reversibility of the anteroapical wall, most likely breast artifact, post-stress EF 67%, EKG show NSR at 65, No Lexiscan EKG changes, non-diagnostic for ischemia; STRESS TEST, 01/25/2010 - normal study, post-stress EF 66%, no significant ischemia  . Cerebral atherosclerosis 09/29/2012   CAROTID DUPLEX - RIGHT  BULB/PROXIMAL ICA-moderate amount of fibrous plaque, 50-69% diameter reduction; LEFT CEA-normal, no significant diameter reduction  . Colitis   . GERD (gastroesophageal reflux disease)   . Hyperlipidemia   . Hypertension   . Osteopenia   . Restless leg syndrome   . Shortness of breath 03/30/2005   2D ECHO - EF >55%, normal  . TIA (transient ischemic attack) 01/27/2010   2D ECHO - EF 65%, normal    Past Surgical History:  Procedure Laterality Date  . ABDOMINAL EXPLORATION SURGERY    . CAROTID ENDARTERECTOMY    . Peripheral Vascular Catheterization  07/16/2005   Right verterbral-50% smooth segmental badsilar artery stenosis on right side, Right carotid-50% ulcerated proxmial stenosis, Left carotid-60 to 70% left externam carotid artery stenosis, 90% proximal left ICA stenosis    Family History  Problem Relation Age of Onset  . Heart disease Mother   . Hypertension Mother   . Hypertension Father   . Heart disease Father      Current Outpatient Medications on File Prior to Visit  Medication Sig Dispense Refill  . acetaminophen (TYLENOL) 325 MG tablet Take 325-650 mg by mouth every 6 (six) hours as needed (for headaches or pain).    [name] aspirin 81 MG tablet Take 81 mg by mouth 2 (two) times daily.     . carvedilol (COREG) 3.125 MG tablet TAKE 1 TABLET BY MOUTH ONCE DAILY (Patient taking differently: Take 3.125 mg by mouth daily. ) 90 tablet 2  . cyclobenzaprine (FLEXERIL) 5 MG tablet TAKE 1 TABLET BY MOUTH AT BEDTIME AS NEEDED FOR MUSCLE SPASM OR  LEG  CRAMPS 90 tablet 0  . HYDROcodone-acetaminophen (NORCO) 5-325 MG tablet Take 0.5-1 tablets by mouth every 6 (six) hours as needed. 10 tablet 0  . lidocaine (LIDODERM) 5 % Place 1 patch onto the skin daily. Remove & Discard patch within 12 hours or as directed by MD 30 patch 0  . loratadine (CLARITIN) 10 MG tablet Take 10 mg by mouth daily.    [name] losartan (COZAAR) 50 MG tablet TAKE 1/2 (ONE-HALF) TABLET BY MOUTH ONCE DAILY (Patient taking differently: Take 25 mg by mouth daily. ) 30 tablet 8  . nitroGLYCERIN (NITROSTAT) 0.4 MG SL tablet DISSOLVE ONE TABLET UNDER THE TONGUE AS NEEDED FOR CHEST PAIN**MAX 3 TABLETS EACH 5 MINUTES APART** 25 tablet 3  . Omega-3 Krill Oil 500 MG CAPS Take 500 mg by mouth daily.     [name] omeprazole (PRILOSEC) 20 MG capsule Take 1 po daily 90  capsule 2  . rosuvastatin (CRESTOR) 20 MG tablet Take 1 tablet (20 mg total) by mouth daily. 90 tablet 3  . tiZANidine (ZANAFLEX) 4 MG tablet Take 1 tablet (4 mg total) by mouth every 6 (six) hours as needed for muscle spasms. (Patient taking differently: Take 2 mg by mouth every 6 (six) hours as needed for muscle spasms. ) 30 tablet 1   No current facility-administered medications on file prior to visit.     Allergies  Allergen Reactions  . Penicillins Shortness Of Breath and Other (See Comments)    Has patient had a PCN reaction causing immediate rash, facial/tongue/throat swelling, SOB or  lightheadedness with hypotension: Yes Has patient had a PCN reaction causing severe rash involving mucus membranes or skin necrosis: Unk Has patient had a PCN reaction that required hospitalization: Unk Has patient had a PCN reaction occurring within the last 10 years: Yes "States that she was in ICU after being administered"   . Codeine Nausea And Vomiting  . Lipitor [Atorvastatin] Other (See Comments)    Myalgias  . Lisinopril Cough  . Pravastatin Other (See Comments)    Myalgias    Social History   Substance and Sexual Activity  Alcohol Use No    Social History   Tobacco Use  Smoking Status Former Smoker  . Types: Cigarettes  . Quit date: 04/21/1983  . Years since quitting: 36.0  Smokeless Tobacco Never Used    Review of Systems  Constitutional: Negative.   HENT: Negative.   Eyes: Negative.   Respiratory: Negative.   Cardiovascular: Negative.   Gastrointestinal: Negative.   Genitourinary: Negative.   Musculoskeletal: Positive for back pain.  Skin: Negative.   Neurological: Negative.   Endo/Heme/Allergies: Negative.   Psychiatric/Behavioral: Negative.     Objective   Vitals:   05/14/19 1139  BP: (!) 163/85  Pulse: 77  Resp: 16  Temp: (!) 96.8 F (36 C)  SpO2: 97%    Physical Exam Vitals signs reviewed.  Constitutional:      Appearance: Normal appearance. She is not ill-appearing.  HENT:     Head: Normocephalic and atraumatic.  Neck:     Musculoskeletal: Normal range of motion and neck supple. No neck rigidity or muscular tenderness.     Vascular: No carotid bruit.     Comments: Small pea-sized right cervical lymphadenopathy just superior to the clavicle. Cardiovascular:     Rate and Rhythm: Normal rate and regular rhythm.     Heart sounds: Normal heart sounds. No murmur. No friction rub. No gallop.   Pulmonary:     Effort: Pulmonary effort is normal. No respiratory distress.     Breath sounds: Normal breath sounds. No stridor. No wheezing,  rhonchi or rales.  Lymphadenopathy:     Cervical: Cervical adenopathy present.  Skin:    General: Skin is warm and dry.  Neurological:     Mental Status: She is alert and oriented to person, place, and time.    Dr. Tomie China notes reviewed.  PET scan report reviewed. Assessment  Lymphadenopathy, possible malignancy, metastatic disease to the bone Plan   Patient is scheduled for a lymph node biopsy of the neck on 05/20/2019.  The risks and benefits of the procedure including bleeding, infection, and the possibility of nerve injury were fully explained to the patient, who gave informed consent.

## 2019-05-14 NOTE — Patient Instructions (Signed)
Open Lymph Node Biopsy An open lymph node biopsy is a procedure to remove a lymph node so that it can be examined under a microscope. Lymph nodes are part of the body's disease-fighting system (immune system). The immune system protects the body from infections, germs, and diseases. An open lymph node biopsy may be done to:  Look for germs or cancer cells in your lymph node.  Find out why your lymph node is swollen.  Find out more about a condition you have. Lymph nodes are found in many locations in the body. Biopsies are often done on lymph nodes in the head, neck, armpit, or groin. Tell a health care provider about:  Any allergies you have.  All medicines you are taking, including vitamins, herbs, eye drops, creams, and over-the-counter medicines.  Any problems you or family members have had with anesthetic medicines.  Any blood disorders you have.  Any surgeries you have had.  Any medical conditions you have or have had.  Whether you are pregnant or may be pregnant. What are the risks? Generally, this is a safe procedure. However, problems may occur, including:  Infection.  Bleeding.  Allergic reactions to medicines.  Damage to other structures or organs, such as a nerve.  Scarring. What happens before the procedure? Medicines Ask your health care provider about:  Changing or stopping your regular medicines. This is especially important if you are taking diabetes medicines or blood thinners.  Taking medicines such as aspirin and ibuprofen. These medicines can thin your blood. Do not take these medicines unless your health care provider tells you to take them.  Taking over-the-counter medicines, vitamins, herbs, and supplements. General instructions  Follow instructions from your health care provider about eating or drinking restrictions.  You may have an exam or testing.  You may have a blood or urine sample taken.  Plan to have someone take you home from the  hospital or clinic.  If you will be going home right after the procedure, plan to have someone with you for 24 hours.  Ask your health care provider how your surgical site will be marked or identified.  Ask your health care provider what steps will be taken to help prevent infection. These may include: ? Removing hair at the biopsy site. ? Washing skin with a germ-killing soap. ? Taking antibiotic medicine. What happens during the procedure?   An IV will be inserted into one of your veins.  You will be given one or more of the following: ? A medicine to help you relax (sedative). ? A medicine to numb the area (local anesthetic).  An incision will be made in the area where your lymph node is located.  Your lymph node will be removed.  Your incision will be closed with stitches (sutures).  An antibiotic ointment may be applied to your incision.  A bandage (dressing) will be placed over your incision. The procedure may vary among health care providers and hospitals. What happens after the procedure?  Your blood pressure, heart rate, breathing rate, and blood oxygen level will be monitored until you leave the hospital or clinic.  Do not drive for 24 hours if you were given a sedative during your procedure.  It is up to you to get the results of your procedure. Ask your health care provider, or the department that is doing the procedure, when your results will be ready. Summary  An open lymph node biopsy is a procedure to remove a lymph node so that  it can be checked for infections, germs, and disease.  Generally, this is a safe procedure. However, problems may occur, including bleeding, infection, allergic reaction to medicines, and damage to other structures or organs.  Follow your health care provider's instructions before the procedure. These may include changing or stopping some medicines and restricting what you eat and drink.  During the procedure, an incision will be  made in the area of the lymph node, the lymph node will be removed, and the incision will be closed with sutures.  You will be monitored after the procedure. Do not drive for 24 hours if you were given a sedative during your procedure. This information is not intended to replace advice given to you by your health care provider. Make sure you discuss any questions you have with your health care provider. Document Released: 12/02/2015 Document Revised: 06/12/2018 Document Reviewed: 06/12/2018 Elsevier Interactive Patient Education  2019 [employer].

## 2019-05-15 ENCOUNTER — Encounter (HOSPITAL_COMMUNITY): Payer: Self-pay

## 2019-05-15 ENCOUNTER — Other Ambulatory Visit (HOSPITAL_COMMUNITY)
Admission: RE | Admit: 2019-05-15 | Discharge: 2019-05-15 | Disposition: A | Discharge status: Home / Self Care | Payer: Medicare Other | Source: Ambulatory Visit | Attending: General Surgery | Admitting: General Surgery

## 2019-05-15 ENCOUNTER — Other Ambulatory Visit: Payer: Self-pay

## 2019-05-15 DIAGNOSIS — Z1159 Encounter for screening for other viral diseases: Secondary | ICD-10-CM | POA: Diagnosis present

## 2019-05-15 NOTE — H&P (Signed)
Lindsey Mullins; [id number]; May 19, 1946   HPI Patient is a 73 year old black female who was referred to my care by Dr. Delton Coombes for a lymph node biopsy.  Patient was recently found to have a compression fracture in the back secondary to a lytic bone lesion.  She underwent a PET scan which reveals generalized lymphadenopathy.  There is also suggestion of a right lung carcinoma.  She also has a lesion in the left breast on mammogram.  She has been referred for a lymph node biopsy of the right supraclavicular region.  Complains of back pain. Past Medical History:  Diagnosis Date  . Atypical chest pain 05/23/2012   STRESS TEST - small to moderate sized area of partial reversibility of the anteroapical wall, most likely breast artifact, post-stress EF 67%, EKG show NSR at 65, No Lexiscan EKG changes, non-diagnostic for ischemia; STRESS TEST, 01/25/2010 - normal study, post-stress EF 66%, no significant ischemia  . Cerebral atherosclerosis 09/29/2012   CAROTID DUPLEX - RIGHT  BULB/PROXIMAL ICA-moderate amount of fibrous plaque, 50-69% diameter reduction; LEFT CEA-normal, no significant diameter reduction  . Colitis   . GERD (gastroesophageal reflux disease)   . Hyperlipidemia   . Hypertension   . Osteopenia   . Restless leg syndrome   . Shortness of breath 03/30/2005   2D ECHO - EF >55%, normal  . TIA (transient ischemic attack) 01/27/2010   2D ECHO - EF 65%, normal    Past Surgical History:  Procedure Laterality Date  . ABDOMINAL EXPLORATION SURGERY    . CAROTID ENDARTERECTOMY    . Peripheral Vascular Catheterization  07/16/2005   Right verterbral-50% smooth segmental badsilar artery stenosis on right side, Right carotid-50% ulcerated proxmial stenosis, Left carotid-60 to 70% left externam carotid artery stenosis, 90% proximal left ICA stenosis    Family History  Problem Relation Age of Onset  . Heart disease Mother   . Hypertension Mother   . Hypertension Father   . Heart disease Father      Current Outpatient Medications on File Prior to Visit  Medication Sig Dispense Refill  . acetaminophen (TYLENOL) 325 MG tablet Take 325-650 mg by mouth every 6 (six) hours as needed (for headaches or pain).    [name] aspirin 81 MG tablet Take 81 mg by mouth 2 (two) times daily.     . carvedilol (COREG) 3.125 MG tablet TAKE 1 TABLET BY MOUTH ONCE DAILY (Patient taking differently: Take 3.125 mg by mouth daily. ) 90 tablet 2  . cyclobenzaprine (FLEXERIL) 5 MG tablet TAKE 1 TABLET BY MOUTH AT BEDTIME AS NEEDED FOR MUSCLE SPASM OR  LEG  CRAMPS 90 tablet 0  . HYDROcodone-acetaminophen (NORCO) 5-325 MG tablet Take 0.5-1 tablets by mouth every 6 (six) hours as needed. 10 tablet 0  . lidocaine (LIDODERM) 5 % Place 1 patch onto the skin daily. Remove & Discard patch within 12 hours or as directed by MD 30 patch 0  . loratadine (CLARITIN) 10 MG tablet Take 10 mg by mouth daily.    [name] losartan (COZAAR) 50 MG tablet TAKE 1/2 (ONE-HALF) TABLET BY MOUTH ONCE DAILY (Patient taking differently: Take 25 mg by mouth daily. ) 30 tablet 8  . nitroGLYCERIN (NITROSTAT) 0.4 MG SL tablet DISSOLVE ONE TABLET UNDER THE TONGUE AS NEEDED FOR CHEST PAIN**MAX 3 TABLETS EACH 5 MINUTES APART** 25 tablet 3  . Omega-3 Krill Oil 500 MG CAPS Take 500 mg by mouth daily.     [name] omeprazole (PRILOSEC) 20 MG capsule Take 1 po daily 90  capsule 2  . rosuvastatin (CRESTOR) 20 MG tablet Take 1 tablet (20 mg total) by mouth daily. 90 tablet 3  . tiZANidine (ZANAFLEX) 4 MG tablet Take 1 tablet (4 mg total) by mouth every 6 (six) hours as needed for muscle spasms. (Patient taking differently: Take 2 mg by mouth every 6 (six) hours as needed for muscle spasms. ) 30 tablet 1   No current facility-administered medications on file prior to visit.     Allergies  Allergen Reactions  . Penicillins Shortness Of Breath and Other (See Comments)    Has patient had a PCN reaction causing immediate rash, facial/tongue/throat swelling, SOB or  lightheadedness with hypotension: Yes Has patient had a PCN reaction causing severe rash involving mucus membranes or skin necrosis: Unk Has patient had a PCN reaction that required hospitalization: Unk Has patient had a PCN reaction occurring within the last 10 years: Yes "States that she was in ICU after being administered"   . Codeine Nausea And Vomiting  . Lipitor [Atorvastatin] Other (See Comments)    Myalgias  . Lisinopril Cough  . Pravastatin Other (See Comments)    Myalgias    Social History   Substance and Sexual Activity  Alcohol Use No    Social History   Tobacco Use  Smoking Status Former Smoker  . Types: Cigarettes  . Quit date: 04/21/1983  . Years since quitting: 36.0  Smokeless Tobacco Never Used    Review of Systems  Constitutional: Negative.   HENT: Negative.   Eyes: Negative.   Respiratory: Negative.   Cardiovascular: Negative.   Gastrointestinal: Negative.   Genitourinary: Negative.   Musculoskeletal: Positive for back pain.  Skin: Negative.   Neurological: Negative.   Endo/Heme/Allergies: Negative.   Psychiatric/Behavioral: Negative.     Objective   Vitals:   05/14/19 1139  BP: (!) 163/85  Pulse: 77  Resp: 16  Temp: (!) 96.8 F (36 C)  SpO2: 97%    Physical Exam Vitals signs reviewed.  Constitutional:      Appearance: Normal appearance. She is not ill-appearing.  HENT:     Head: Normocephalic and atraumatic.  Neck:     Musculoskeletal: Normal range of motion and neck supple. No neck rigidity or muscular tenderness.     Vascular: No carotid bruit.     Comments: Small pea-sized right cervical lymphadenopathy just superior to the clavicle. Cardiovascular:     Rate and Rhythm: Normal rate and regular rhythm.     Heart sounds: Normal heart sounds. No murmur. No friction rub. No gallop.   Pulmonary:     Effort: Pulmonary effort is normal. No respiratory distress.     Breath sounds: Normal breath sounds. No stridor. No wheezing,  rhonchi or rales.  Lymphadenopathy:     Cervical: Cervical adenopathy present.  Skin:    General: Skin is warm and dry.  Neurological:     Mental Status: She is alert and oriented to person, place, and time.    Dr. Tomie China notes reviewed.  PET scan report reviewed. Assessment  Lymphadenopathy, possible malignancy, metastatic disease to the bone Plan   Patient is scheduled for a lymph node biopsy of the neck on 05/20/2019.  The risks and benefits of the procedure including bleeding, infection, and the possibility of nerve injury were fully explained to the patient, who gave informed consent.

## 2019-05-16 LAB — NOVEL CORONAVIRUS, NAA (HOSP ORDER, SEND-OUT TO REF LAB; TAT 18-24 HRS): SARS-CoV-2, NAA: NOT DETECTED

## 2019-05-18 ENCOUNTER — Encounter (HOSPITAL_COMMUNITY)
Admission: RE | Admit: 2019-05-18 | Discharge: 2019-05-18 | Disposition: A | Discharge status: Home / Self Care | Payer: Medicare Other | Source: Ambulatory Visit | Attending: General Surgery | Admitting: General Surgery

## 2019-05-20 ENCOUNTER — Ambulatory Visit (HOSPITAL_COMMUNITY)
Admission: RE | Admit: 2019-05-20 | Discharge: 2019-05-20 | Disposition: A | Discharge status: Home / Self Care | Payer: Medicare Other | Attending: General Surgery | Admitting: General Surgery

## 2019-05-20 ENCOUNTER — Ambulatory Visit (HOSPITAL_COMMUNITY): Payer: Medicare Other | Admitting: Anesthesiology

## 2019-05-20 ENCOUNTER — Encounter (HOSPITAL_COMMUNITY)
Admission: RE | Disposition: A | Discharge status: Home / Self Care | Payer: Self-pay | Source: Home / Self Care | Attending: General Surgery

## 2019-05-20 ENCOUNTER — Other Ambulatory Visit: Payer: Self-pay

## 2019-05-20 ENCOUNTER — Encounter: Payer: Self-pay | Admitting: Hematology

## 2019-05-20 ENCOUNTER — Encounter (HOSPITAL_COMMUNITY): Payer: Self-pay

## 2019-05-20 DIAGNOSIS — Z87891 Personal history of nicotine dependence: Secondary | ICD-10-CM | POA: Diagnosis not present

## 2019-05-20 DIAGNOSIS — I10 Essential (primary) hypertension: Secondary | ICD-10-CM | POA: Insufficient documentation

## 2019-05-20 DIAGNOSIS — Z79899 Other long term (current) drug therapy: Secondary | ICD-10-CM | POA: Diagnosis not present

## 2019-05-20 DIAGNOSIS — E785 Hyperlipidemia, unspecified: Secondary | ICD-10-CM | POA: Diagnosis not present

## 2019-05-20 DIAGNOSIS — C770 Secondary and unspecified malignant neoplasm of lymph nodes of head, face and neck: Secondary | ICD-10-CM | POA: Diagnosis not present

## 2019-05-20 DIAGNOSIS — K219 Gastro-esophageal reflux disease without esophagitis: Secondary | ICD-10-CM | POA: Insufficient documentation

## 2019-05-20 DIAGNOSIS — Z8673 Personal history of transient ischemic attack (TIA), and cerebral infarction without residual deficits: Secondary | ICD-10-CM | POA: Insufficient documentation

## 2019-05-20 DIAGNOSIS — Z7982 Long term (current) use of aspirin: Secondary | ICD-10-CM | POA: Insufficient documentation

## 2019-05-20 DIAGNOSIS — C801 Malignant (primary) neoplasm, unspecified: Secondary | ICD-10-CM | POA: Insufficient documentation

## 2019-05-20 DIAGNOSIS — R590 Localized enlarged lymph nodes: Secondary | ICD-10-CM | POA: Diagnosis not present

## 2019-05-20 HISTORY — PX: LYMPH NODE BIOPSY: SHX201

## 2019-05-20 SURGERY — LYMPH NODE BIOPSY
Anesthesia: General | Site: Chest

## 2019-05-20 MED ORDER — CHLORHEXIDINE GLUCONATE CLOTH 2 % EX PADS: 6.0000 | MEDICATED_PAD | Freq: Once | CUTANEOUS | Status: DC

## 2019-05-20 MED ORDER — FENTANYL CITRATE (PF) 100 MCG/2ML IJ SOLN
INTRAMUSCULAR | Status: AC
  Filled 2019-05-20: qty 2

## 2019-05-20 MED ORDER — EPHEDRINE SULFATE 50 MG/ML IJ SOLN
INTRAMUSCULAR | Status: DC | PRN
  Administered 2019-05-20: 15 mg via INTRAVENOUS
  Administered 2019-05-20: 10 mg via INTRAVENOUS

## 2019-05-20 MED ORDER — BUPIVACAINE HCL (PF) 0.5 % IJ SOLN
INTRAMUSCULAR | Status: DC | PRN
  Administered 2019-05-20: 2 mL

## 2019-05-20 MED ORDER — TRAMADOL HCL 50 MG PO TABS: 50.0000 mg | ORAL_TABLET | Freq: Four times a day (QID) | ORAL | 0 refills | Status: DC | PRN

## 2019-05-20 MED ORDER — PROPOFOL 10 MG/ML IV BOLUS
INTRAVENOUS | Status: DC | PRN
  Administered 2019-05-20: 20 mg via INTRAVENOUS
  Administered 2019-05-20: 170 mg via INTRAVENOUS

## 2019-05-20 MED ORDER — HEMOSTATIC AGENTS (NO CHARGE) OPTIME
TOPICAL | Status: DC | PRN
  Administered 2019-05-20: 1 via TOPICAL

## 2019-05-20 MED ORDER — EPHEDRINE 5 MG/ML INJ
INTRAVENOUS | Status: AC
  Filled 2019-05-20: qty 10

## 2019-05-20 MED ORDER — KETOROLAC TROMETHAMINE 30 MG/ML IJ SOLN
15.0000 mg | Freq: Once | INTRAMUSCULAR | Status: AC
  Administered 2019-05-20: 15 mg via INTRAVENOUS
  Filled 2019-05-20: qty 1

## 2019-05-20 MED ORDER — LACTATED RINGERS IV SOLN
INTRAVENOUS | Status: DC | PRN
  Administered 2019-05-20 (×2): via INTRAVENOUS

## 2019-05-20 MED ORDER — BUPIVACAINE HCL (PF) 0.5 % IJ SOLN
INTRAMUSCULAR | Status: AC
  Filled 2019-05-20: qty 30

## 2019-05-20 MED ORDER — PROPOFOL 10 MG/ML IV BOLUS
INTRAVENOUS | Status: AC
  Filled 2019-05-20: qty 40

## 2019-05-20 MED ORDER — FENTANYL CITRATE (PF) 100 MCG/2ML IJ SOLN
INTRAMUSCULAR | Status: DC | PRN
  Administered 2019-05-20: 25 ug via INTRAVENOUS
  Administered 2019-05-20: 50 ug via INTRAVENOUS
  Administered 2019-05-20: 25 ug via INTRAVENOUS

## 2019-05-20 SURGICAL SUPPLY — 29 items
APPLIER CLIP 9.375 SM OPEN (CLIP) ×3
CHLORAPREP W/TINT 10.5 ML (MISCELLANEOUS) ×3 IMPLANT
CLIP APPLIE 9.375 SM OPEN (CLIP) ×1 IMPLANT
CLOTH BEACON ORANGE TIMEOUT ST (SAFETY) ×3 IMPLANT
COVER LIGHT HANDLE STERIS (MISCELLANEOUS) ×6 IMPLANT
COVER WAND RF STERILE (DRAPES) ×3 IMPLANT
DECANTER SPIKE VIAL GLASS SM (MISCELLANEOUS) ×3 IMPLANT
DERMABOND ADVANCED (GAUZE/BANDAGES/DRESSINGS) ×2
DERMABOND ADVANCED .7 DNX12 (GAUZE/BANDAGES/DRESSINGS) ×1 IMPLANT
ELECT REM PT RETURN 9FT ADLT (ELECTROSURGICAL) ×3
ELECTRODE REM PT RTRN 9FT ADLT (ELECTROSURGICAL) ×1 IMPLANT
GLOVE BIO SURGEON STRL SZ7 (GLOVE) ×3 IMPLANT
GLOVE BIOGEL PI IND STRL 7.0 (GLOVE) ×1 IMPLANT
GLOVE BIOGEL PI INDICATOR 7.0 (GLOVE) ×2
GLOVE SURG SS PI 7.5 STRL IVOR (GLOVE) ×3 IMPLANT
GOWN STRL REUS W/TWL LRG LVL3 (GOWN DISPOSABLE) ×6 IMPLANT
KIT CLEAN CATCH URINE (SET/KITS/TRAYS/PACK) ×3 IMPLANT
KIT TURNOVER KIT A (KITS) ×3 IMPLANT
MANIFOLD NEPTUNE II (INSTRUMENTS) ×3 IMPLANT
NEEDLE HYPO 25X1 1.5 SAFETY (NEEDLE) ×3 IMPLANT
NS IRRIG 1000ML POUR BTL (IV SOLUTION) ×3 IMPLANT
PACK MINOR (CUSTOM PROCEDURE TRAY) ×3 IMPLANT
PAD ARMBOARD 7.5X6 YLW CONV (MISCELLANEOUS) ×3 IMPLANT
SET BASIN LINEN APH (SET/KITS/TRAYS/PACK) ×3 IMPLANT
SPONGE INTESTINAL PEANUT (DISPOSABLE) ×6 IMPLANT
SUT MNCRL AB 4-0 PS2 18 (SUTURE) ×6 IMPLANT
SUT VIC AB 4-0 SH 27 (SUTURE) ×2
SUT VIC AB 4-0 SH 27XBRD (SUTURE) ×1 IMPLANT
SYR CONTROL 10ML LL (SYRINGE) ×3 IMPLANT

## 2019-05-20 NOTE — Progress Notes (Signed)
Relieved Richarda Osmond, RN for lunch.

## 2019-05-20 NOTE — Op Note (Signed)
Patient:  Lindsey Mullins  DOB:  03-Jan-1946  MRN:  [id number]   Preop Diagnosis: Cervical lymphadenopathy  Postop Diagnosis: Same  Procedure: Cervical lymph node biopsy  Surgeon: Aviva Signs, MD  Assistant: Blake Divine, MD  Anes: General  Indications: Patient is a 73 year old black female who was referred to my care by oncology for a cervical lymph node biopsy.  There is a question whether she has metastatic disease either from the breast or lung.  The risks and benefits of the procedure including bleeding, infection, and biopsy being nondiagnostic were fully explained to the patient, who gave informed consent.  Procedure note: The patient was placed in supine position.  After general anesthesia was administered, the right neck and upper chest was prepped and draped using the usual sterile technique with ChloraPrep.  Surgical site confirmation was performed.  A transverse incision was made over the posterior aspect of the sternocleidomastoid on the right side.  The dissection was taken down and a muscle splitting dissection was performed longitudinally.  The matted lymph nodes were found but appeared to be somewhat deep.  The carotid was noted to be medial to the area of dissection.  Dr. Constance Haw assisted in exposure.  Some lymphoid tissue was excised and sent to pathology for flow cytometry.  A bleeding was controlled using small clips and Arista.  0.5% Sensorcaine was instilled into the surrounding wound.  The subcutaneous layer along with the platysma was reapproximated using 4-0 Vicryl subcuticular suture.  The skin was closed using a 4-0 Monocryl subcuticular suture.  Dermabond was applied.  Another subcutaneous nodule was found just below the right clavicle.  An incision was made and the mass removed.  It appeared to be more fat necrosis, but was sent to pathology anyway.  Subcutaneous layer was reapproximated using the 4-0 Vicryl subcuticular suture.  The skin was closed using a  4-0 Monocryl subcuticular suture.  Dermabond was applied.  All tape and needle counts were correct at the end of the procedures.  The patient was awakened and transferred to PACU in stable condition.  Complications: None  EBL: 5 cc  Specimen: Cervical lymph nodes, subcutaneous nodule

## 2019-05-20 NOTE — Anesthesia Preprocedure Evaluation (Signed)
Anesthesia Evaluation  Patient identified by MRN, date of birth, ID band Patient awake    Reviewed: Allergy & Precautions, NPO status , Patient's Chart, lab work & pertinent test results, reviewed documented beta blocker date and time   Airway Mallampati: II  TM Distance: >3 FB Neck ROM: Full    Dental no notable dental hx. (+) Edentulous Upper, Edentulous Lower   Pulmonary shortness of breath and with exertion, former smoker,  Denies any recent SOB sx, denies inhaler use or smoking    Pulmonary exam normal breath sounds clear to auscultation       Cardiovascular Exercise Tolerance: Good hypertension, Pt. on medications and Pt. on home beta blockers negative cardio ROS Normal cardiovascular examI Rhythm:Regular Rate:Normal  Denies ever using NTG   Neuro/Psych PSYCHIATRIC DISORDERS Per Chart -Pt had L CEA 2006 -f/u Carotid studies OK TIA   GI/Hepatic Neg liver ROS, GERD  Medicated and Controlled,  Endo/Other  negative endocrine ROS  Renal/GU negative Renal ROS  negative genitourinary   Musculoskeletal  (+) Arthritis , Osteoarthritis,    Abdominal   Peds negative pediatric ROS (+)  Hematology negative hematology ROS (+)   Anesthesia Other Findings   Reproductive/Obstetrics negative OB ROS                             Anesthesia Physical Anesthesia Plan  ASA: III  Anesthesia Plan: General   Post-op Pain Management:    Induction: Intravenous  PONV Risk Score and Plan: 3 and Ondansetron, Dexamethasone and Treatment may vary due to age or medical condition  Airway Management Planned: LMA  Additional Equipment:   Intra-op Plan:   Post-operative Plan:   Informed Consent: I have reviewed the patients History and Physical, chart, labs and discussed the procedure including the risks, benefits and alternatives for the proposed anesthesia with the patient or authorized representative who  has indicated his/her understanding and acceptance.     Dental advisory given  Plan Discussed with: CRNA  Anesthesia Plan Comments: (Plan Full PPE use  Plan GA (LMA) to start -GETA as needed -d/w pt -WTP with same after Q&A)        Anesthesia Quick Evaluation

## 2019-05-20 NOTE — Anesthesia Procedure Notes (Signed)
Procedure Name: LMA Insertion Date/Time: 05/20/2019 9:54 AM Performed by: Charmaine Downs, CRNA Pre-anesthesia Checklist: Patient identified, Patient being monitored, Emergency Drugs available, Timeout performed and Suction available Patient Re-evaluated:Patient Re-evaluated prior to induction Oxygen Delivery Method: Circle System Utilized Preoxygenation: Pre-oxygenation with 100% oxygen Induction Type: IV induction Ventilation: Mask ventilation without difficulty LMA: LMA inserted LMA Size: 4.0 Number of attempts: 1 Placement Confirmation: positive ETCO2 and breath sounds checked- equal and bilateral Tube secured with: Tape Dental Injury: Teeth and Oropharynx as per pre-operative assessment

## 2019-05-20 NOTE — Anesthesia Postprocedure Evaluation (Signed)
Anesthesia Post Note  Patient: Lindsey Mullins  Procedure(s) Performed: LYMPH NODE BIOPSY CERVICAL (N/A Chest)  Patient location during evaluation: PACU Anesthesia Type: General Level of consciousness: awake and alert and oriented Pain management: pain level controlled Vital Signs Assessment: post-procedure vital signs reviewed and stable Respiratory status: spontaneous breathing Cardiovascular status: stable Postop Assessment: no apparent nausea or vomiting Anesthetic complications: no     Last Vitals:  Vitals:   05/20/19 1315 05/20/19 1344  BP: 121/77 (!) 167/78  Pulse: 66 65  Resp: 17 16  Temp:  36.4 C  SpO2: 99% 92%    Last Pain:  Vitals:   05/20/19 1344  TempSrc: Oral  PainSc: 0-No pain                 Laurana Magistro A

## 2019-05-20 NOTE — Transfer of Care (Signed)
Immediate Anesthesia Transfer of Care Note  Patient: Lindsey Mullins  Procedure(s) Performed: LYMPH NODE BIOPSY CERVICAL (N/A Chest)  Patient Location: PACU  Anesthesia Type:General  Level of Consciousness: drowsy and patient cooperative  Airway & Oxygen Therapy: Patient Spontanous Breathing and Patient connected to nasal cannula oxygen  Post-op Assessment: Report given to RN, Post -op Vital signs reviewed and stable and Patient moving all extremities  Post vital signs: Reviewed and stable  Last Vitals:  Vitals Value Taken Time  BP 132/63 05/20/19 1225  Temp    Pulse 76 05/20/19 1227  Resp 17 05/20/19 1227  SpO2 95 % 05/20/19 1227  Vitals shown include unvalidated device data.  Last Pain:  Vitals:   05/20/19 0911  TempSrc: Oral  PainSc: 0-No pain         Complications: No apparent anesthesia complications

## 2019-05-20 NOTE — Interval H&P Note (Signed)
History and Physical Interval Note:  05/20/2019 10:06 AM  Lindsey Mullins  has presented today for surgery, with the diagnosis of lymphadenopathy.  The various methods of treatment have been discussed with the patient and family. After consideration of risks, benefits and other options for treatment, the patient has consented to  Procedure(s): LYMPH NODE BIOPSY CERVICAL (N/A) as a surgical intervention.  The patient's history has been reviewed, patient examined, no change in status, stable for surgery.  I have reviewed the patient's chart and labs.  Questions were answered to the patient's satisfaction.     Aviva Signs

## 2019-05-21 ENCOUNTER — Encounter (HOSPITAL_COMMUNITY): Payer: Self-pay | Admitting: General Surgery

## 2019-05-26 ENCOUNTER — Encounter (HOSPITAL_COMMUNITY): Payer: Self-pay | Admitting: Hematology

## 2019-05-26 ENCOUNTER — Inpatient Hospital Stay (HOSPITAL_COMMUNITY): Payer: Medicare Other | Attending: Hematology | Admitting: Hematology

## 2019-05-26 ENCOUNTER — Other Ambulatory Visit: Payer: Self-pay

## 2019-05-26 DIAGNOSIS — C799 Secondary malignant neoplasm of unspecified site: Secondary | ICD-10-CM

## 2019-05-26 DIAGNOSIS — Z5111 Encounter for antineoplastic chemotherapy: Secondary | ICD-10-CM | POA: Diagnosis not present

## 2019-05-26 DIAGNOSIS — M545 Low back pain: Secondary | ICD-10-CM | POA: Diagnosis not present

## 2019-05-26 DIAGNOSIS — Z87891 Personal history of nicotine dependence: Secondary | ICD-10-CM | POA: Diagnosis not present

## 2019-05-26 DIAGNOSIS — Z7982 Long term (current) use of aspirin: Secondary | ICD-10-CM | POA: Diagnosis not present

## 2019-05-26 DIAGNOSIS — Z5112 Encounter for antineoplastic immunotherapy: Secondary | ICD-10-CM | POA: Diagnosis not present

## 2019-05-26 DIAGNOSIS — Z79899 Other long term (current) drug therapy: Secondary | ICD-10-CM | POA: Insufficient documentation

## 2019-05-26 DIAGNOSIS — Z8673 Personal history of transient ischemic attack (TIA), and cerebral infarction without residual deficits: Secondary | ICD-10-CM | POA: Insufficient documentation

## 2019-05-26 DIAGNOSIS — E785 Hyperlipidemia, unspecified: Secondary | ICD-10-CM | POA: Insufficient documentation

## 2019-05-26 DIAGNOSIS — C7889 Secondary malignant neoplasm of other digestive organs: Secondary | ICD-10-CM | POA: Insufficient documentation

## 2019-05-26 DIAGNOSIS — K5900 Constipation, unspecified: Secondary | ICD-10-CM | POA: Diagnosis not present

## 2019-05-26 DIAGNOSIS — C7951 Secondary malignant neoplasm of bone: Secondary | ICD-10-CM | POA: Insufficient documentation

## 2019-05-26 DIAGNOSIS — M8580 Other specified disorders of bone density and structure, unspecified site: Secondary | ICD-10-CM | POA: Diagnosis not present

## 2019-05-26 DIAGNOSIS — I10 Essential (primary) hypertension: Secondary | ICD-10-CM | POA: Insufficient documentation

## 2019-05-26 DIAGNOSIS — G2581 Restless legs syndrome: Secondary | ICD-10-CM | POA: Insufficient documentation

## 2019-05-26 DIAGNOSIS — C801 Malignant (primary) neoplasm, unspecified: Secondary | ICD-10-CM

## 2019-05-26 DIAGNOSIS — K219 Gastro-esophageal reflux disease without esophagitis: Secondary | ICD-10-CM | POA: Insufficient documentation

## 2019-05-26 NOTE — Progress Notes (Signed)
Lindsey Mullins, [place] [address]   CLINIC:  Medical Oncology/Hematology  PCP:  [name], MD [address] [place] [address] [phone]   REASON FOR VISIT: work up   INTERVAL HISTORY:  Lindsey Mullins 73 y.o. female returns for routine follow-up for the work up for her back pain. She is here complaining of back pain. She reports she has tried every medication and they either make her sick or make her head feel funny. She is not wanting to take them. She is taking tylenol every 3 hours for her pain. Denies any nausea, vomiting, or diarrhea. Denies any new pains. Had not noticed any recent bleeding such as epistaxis, hematuria or hematochezia. Denies recent chest pain on exertion, shortness of breath on minimal exertion, pre-syncopal episodes, or palpitations. Denies any numbness or tingling in hands or feet. Denies any recent fevers, infections, or recent hospitalizations. Patient reports appetite at 25% and energy level at 0%. She is eating well and maintaining her weight at this time.    REVIEW OF SYSTEMS:  Review of Systems  Musculoskeletal: Positive for back pain.  Psychiatric/Behavioral: Positive for sleep disturbance.  All other systems reviewed and are negative.    PAST MEDICAL/SURGICAL HISTORY:  Past Medical History:  Diagnosis Date  . Atypical chest pain 05/23/2012   STRESS TEST - small to moderate sized area of partial reversibility of the anteroapical wall, most likely breast artifact, post-stress EF 67%, EKG show NSR at 65, No Lexiscan EKG changes, non-diagnostic for ischemia; STRESS TEST, 01/25/2010 - normal study, post-stress EF 66%, no significant ischemia  . Cerebral atherosclerosis 09/29/2012   CAROTID DUPLEX - RIGHT  BULB/PROXIMAL ICA-moderate amount of fibrous plaque, 50-69% diameter reduction; LEFT CEA-normal, no significant diameter reduction  . Colitis   . GERD (gastroesophageal reflux disease)   .  Hyperlipidemia   . Hypertension   . Osteopenia   . Restless leg syndrome   . Shortness of breath 03/30/2005   2D ECHO - EF >55%, normal  . TIA (transient ischemic attack) 01/27/2010   2D ECHO - EF 65%, normal   Past Surgical History:  Procedure Laterality Date  . ABDOMINAL EXPLORATION SURGERY    . CAROTID ENDARTERECTOMY    . LYMPH NODE BIOPSY N/A 05/20/2019   Procedure: LYMPH NODE BIOPSY CERVICAL;  Surgeon: Aviva Signs, MD;  Location: [facility];  Service: General;  Laterality: N/A;  . Peripheral Vascular Catheterization  07/16/2005   Right verterbral-50% smooth segmental badsilar artery stenosis on right side, Right carotid-50% ulcerated proxmial stenosis, Left carotid-60 to 70% left externam carotid artery stenosis, 90% proximal left ICA stenosis     SOCIAL HISTORY:  Social History   Socioeconomic History  . Marital status: Divorced    Spouse name: Not on file  . Number of children: 2  . Years of education: Not on file  . Highest education level: Not on file  Occupational History  . Not on file  Social Needs  . Financial resource strain: Not hard at all  . Food insecurity    Worry: Never true    Inability: Never true  . Transportation needs    Medical: No    Non-medical: No  Tobacco Use  . Smoking status: Former Smoker    Types: Cigarettes    Quit date: 04/21/1983    Years since quitting: 36.1  . Smokeless tobacco: Never Used  Substance and Sexual Activity  . Alcohol use: No  .  Drug use: No  . Sexual activity: Not on file  Lifestyle  . Physical activity    Days per week: 0 days    Minutes per session: 0 min  . Stress: Not at all  Relationships  . Social Herbalist on phone: Twice a week    Gets together: Twice a week    Attends religious service: More than 4 times per year    Active member of club or organization: No    Attends meetings of clubs or organizations: Never    Relationship status: Divorced  . Intimate partner violence    Fear of current or  ex partner: No    Emotionally abused: No    Physically abused: No    Forced sexual activity: No  Other Topics Concern  . Not on file  Social History Narrative  . Not on file    FAMILY HISTORY:  Family History  Problem Relation Age of Onset  . Heart disease Mother   . Hypertension Mother   . Hypertension Father   . Heart disease Father     CURRENT MEDICATIONS:  Outpatient Encounter Medications as of 05/26/2019  Medication Sig  . acetaminophen (TYLENOL) 325 MG tablet Take 325-650 mg by mouth every 6 (six) hours as needed (for headaches or pain).  [name] aspirin 81 MG tablet Take 81 mg by mouth daily.   . carvedilol (COREG) 3.125 MG tablet TAKE 1 TABLET BY MOUTH ONCE DAILY (Patient taking differently: Take 3.125 mg by mouth daily. )  . cyclobenzaprine (FLEXERIL) 5 MG tablet TAKE 1 TABLET BY MOUTH AT BEDTIME AS NEEDED FOR MUSCLE SPASM OR  LEG  CRAMPS (Patient taking differently: Take 5 mg by mouth at bedtime. TAKE 1 TABLET BY MOUTH AT BEDTIME AS NEEDED FOR MUSCLE SPASM OR  LEG  CRAMPS)  . HYDROcodone-acetaminophen (NORCO) 5-325 MG tablet Take 0.5-1 tablets by mouth every 6 (six) hours as needed.  . lidocaine (LIDODERM) 5 % Place 1 patch onto the skin daily. Remove & Discard patch within 12 hours or as directed by MD  . loratadine (CLARITIN) 10 MG tablet Take 10 mg by mouth daily.  [name] losartan (COZAAR) 50 MG tablet TAKE 1/2 (ONE-HALF) TABLET BY MOUTH ONCE DAILY (Patient taking differently: Take 25 mg by mouth daily. )  . nitroGLYCERIN (NITROSTAT) 0.4 MG SL tablet DISSOLVE ONE TABLET UNDER THE TONGUE AS NEEDED FOR CHEST PAIN**MAX 3 TABLETS EACH 5 MINUTES APART** (Patient taking differently: Place 0.4 mg under the tongue every 5 (five) minutes as needed for chest pain. )  . Omega-3 Krill Oil 500 MG CAPS Take 500 mg by mouth daily.   [name] omeprazole (PRILOSEC) 20 MG capsule Take 1 po daily (Patient taking differently: Take 20 mg by mouth daily as needed (acid reflux). )  . rosuvastatin (CRESTOR) 20 MG  tablet Take 1 tablet (20 mg total) by mouth daily. (Patient taking differently: Take 10 mg by mouth 2 (two) times a day. )  . traMADol (ULTRAM) 50 MG tablet Take 1 tablet (50 mg total) by mouth every 6 (six) hours as needed.   No facility-administered encounter medications on file as of 05/26/2019.     ALLERGIES:  Allergies  Allergen Reactions  . Penicillins Shortness Of Breath and Other (See Comments)    Has patient had a PCN reaction causing immediate rash, facial/tongue/throat swelling, SOB or lightheadedness with hypotension: Yes Has patient had a PCN reaction causing severe rash involving mucus membranes or skin necrosis: Unk Has patient had  a PCN reaction that required hospitalization: Unk Has patient had a PCN reaction occurring within the last 10 years: Yes "States that she was in ICU after being administered"   . Codeine Nausea And Vomiting  . Lipitor [Atorvastatin] Other (See Comments)    Myalgias  . Lisinopril Cough  . Pravastatin Other (See Comments)    Myalgias     PHYSICAL EXAM:  ECOG Performance status: 1  There were no vitals filed for this visit. There were no vitals filed for this visit.  Physical Exam Constitutional:      Appearance: Normal appearance. She is normal weight.  Cardiovascular:     Rate and Rhythm: Normal rate and regular rhythm.     Heart sounds: Normal heart sounds.  Pulmonary:     Effort: Pulmonary effort is normal.     Breath sounds: Normal breath sounds.  Abdominal:     General: Bowel sounds are normal.     Palpations: Abdomen is soft.  Musculoskeletal: Normal range of motion.  Skin:    General: Skin is warm and dry.  Neurological:     Mental Status: She is alert and oriented to person, place, and time. Mental status is at baseline.  Psychiatric:        Mood and Affect: Mood normal.        Behavior: Behavior normal.        Thought Content: Thought content normal.        Judgment: Judgment normal.      LABORATORY DATA:  I  have reviewed the labs as listed.  CBC    Component Value Date/Time   WBC 6.9 04/29/2019 1427   RBC 3.75 (L) 04/29/2019 1427   HGB 10.8 (L) 04/29/2019 1427   HCT 35.0 (L) 04/29/2019 1427   PLT 359 04/29/2019 1427   MCV 93.3 04/29/2019 1427   MCH 28.8 04/29/2019 1427   MCHC 30.9 04/29/2019 1427   RDW 14.7 04/29/2019 1427   LYMPHSABS 2.7 04/29/2019 1427   MONOABS 0.7 04/29/2019 1427   EOSABS 0.3 04/29/2019 1427   BASOSABS 0.0 04/29/2019 1427   CMP Latest Ref Rng & Units 04/29/2019 12/31/2018 12/23/2018  Glucose 70 - 99 mg/dL 91 62(L) 93  BUN 8 - 23 mg/dL 9 10 7(L)  Creatinine 0.44 - 1.00 mg/dL 0.74 0.83 0.89  Sodium 135 - 145 mmol/L 141 141 142  Potassium 3.5 - 5.1 mmol/L 3.7 3.9 3.6  Chloride 98 - 111 mmol/L 105 108 109  CO2 22 - 32 mmol/L 23 21 22   Calcium 8.9 - 10.3 mg/dL 9.3 9.4 8.6(L)  Total Protein 6.5 - 8.1 g/dL 8.0 7.0 -  Total Bilirubin 0.3 - 1.2 mg/dL 0.5 0.4 -  Alkaline Phos 38 - 126 U/L 96 - -  AST 15 - 41 U/L 31 35 -  ALT 0 - 44 U/L 18 19 -       DIAGNOSTIC IMAGING:  I have independently reviewed the scans and discussed with the patient.   I have reviewed Francene Finders, NP's note and agree with the documentation.  I personally performed a face-to-face visit, made revisions and my assessment and plan is as follows.    ASSESSMENT & PLAN:   No problem-specific Assessment & Plan notes found for this encounter.   Total time spent is 40 minutes with more than 50% of the time spent face-to-face discussing scan results, differential diagnosis, counseling and coordination of care.  Orders placed this encounter:  No orders of the defined types were placed in  this encounter.     Derek Jack, MD [place] [phone]

## 2019-05-26 NOTE — Assessment & Plan Note (Signed)
1.  Metastatic cancer: -Presentation with low back pain, 4 to 6 weeks duration.  MRI of the lumbar spine showed numerous bone lesions involving lumbar spine, right iliac bone worrisome for metastatic disease. -10 to 12 pound weight loss in the last 3 to 6 months, she was trying to lose weight. -Quit smoking cigarettes 25 years ago, smoked 1 pack/day for 15 years. -PET scan on 05/08/2019 showed intensely hypermetabolic right hilar, mediastinal, right supraclavicular adenopathy.  Solitary small hypermetabolic nodule within the right lung measuring 1 cm, may represent bronchogenic carcinoma.  Focal activity in the right posterior oropharynx would be less likely primary.  No evidence of breast primary.  Widespread hypermetabolic skeletal metastasis involving cervical, thoracic, lumbar spine noted.  Solitary splenic metastasis. - She quit smoking cigarettes 25 years ago, smoked 1 pack/day for 15 years. - Multiple myeloma work-up was negative.  LDH is elevated. -  She had right supraclavicular lymph node biopsy done on 05/20/2019. -I have called the and obtained preliminary report from the pathology.  This was consistent with metastatic carcinoma.  Immunohistochemical stains are pending at this time.  I will call her daughter and inform her. - I will see her back in 1 week for follow-up.  2.  Low back pain: - We started her on lidocaine patch 12 hours on/12 hours off. -She will use tramadol half tablet as needed.

## 2019-05-26 NOTE — Patient Instructions (Addendum)
Lindsey Mullins at [facility] Discharge Instructions  You were seen today by Dr. Delton Coombes. He reviewed your recent biopsy results.  We have talked to the pathologist department and they are still working on your final report.  We do know that it is a cancer but until pathology is done with your report we don't know where it is coming from.   Dr. Delton Coombes talked to you about how you are feeling.  Dr. Delton Coombes will call and talk with [facility].  We will bring you back on Thursday to go over the results of the biopsy.     Thank you for choosing [place] at [facility] to provide your oncology and hematology care.  To afford each patient quality time with our provider, please arrive at least 15 minutes before your scheduled appointment time.   If you have a lab appointment with the [facility] please come in thru the  Main Entrance and check in at the main information desk  You need to re-schedule your appointment should you arrive 10 or more minutes late.  We strive to give you quality time with our providers, and arriving late affects you and other patients whose appointments are after yours.  Also, if you no show three or more times for appointments you may be dismissed from the clinic at the providers discretion.     Again, thank you for choosing [facility].  Our hope is that these requests will decrease the amount of time that you wait before being seen by our physicians.       _____________________________________________________________  Should you have questions after your visit to [facility], please contact our office at [phone] between the hours of 8:00 a.m. and 4:30 p.m.  Voicemails left after 4:00 p.m. will not be returned until the following business day.  For prescription refill requests, have your pharmacy contact our office and allow 72 hours.    Cancer Center Support Programs:   > Cancer Support Group   2nd Tuesday of the month 1pm-2pm, Journey Room

## 2019-05-26 NOTE — Progress Notes (Signed)
Lindsey Mullins, [place] [address]   CLINIC:  Medical Oncology/Hematology  PCP:  [name], MD [address] [place] [address] [phone]   REASON FOR VISIT: Metastatic cancer work-up.   INTERVAL HISTORY:  Lindsey Mullins 73 y.o. female seen for follow-up of metastatic cancer.  She had biopsy of the right supraclavicular lymph node done by Dr. Arnoldo Morale on 05/20/2019.  She reports appetite is 75%.  Energy levels are 75%.  Pain in the lower back rated as 6 out of 10.  Constipation with 1 bowel movement per week present.  Denies any ER visits or hospitalizations.  Denies any nausea vomiting or diarrhea.   REVIEW OF SYSTEMS:  Review of Systems  Gastrointestinal: Positive for constipation.  Musculoskeletal: Positive for back pain.  All other systems reviewed and are negative.    PAST MEDICAL/SURGICAL HISTORY:  Past Medical History:  Diagnosis Date  . Atypical chest pain 05/23/2012   STRESS TEST - small to moderate sized area of partial reversibility of the anteroapical wall, most likely breast artifact, post-stress EF 67%, EKG show NSR at 65, No Lexiscan EKG changes, non-diagnostic for ischemia; STRESS TEST, 01/25/2010 - normal study, post-stress EF 66%, no significant ischemia  . Cerebral atherosclerosis 09/29/2012   CAROTID DUPLEX - RIGHT  BULB/PROXIMAL ICA-moderate amount of fibrous plaque, 50-69% diameter reduction; LEFT CEA-normal, no significant diameter reduction  . Colitis   . GERD (gastroesophageal reflux disease)   . Hyperlipidemia   . Hypertension   . Osteopenia   . Restless leg syndrome   . Shortness of breath 03/30/2005   2D ECHO - EF >55%, normal  . TIA (transient ischemic attack) 01/27/2010   2D ECHO - EF 65%, normal   Past Surgical History:  Procedure Laterality Date  . ABDOMINAL EXPLORATION SURGERY    . CAROTID ENDARTERECTOMY    . LYMPH NODE BIOPSY N/A 05/20/2019   Procedure: LYMPH NODE BIOPSY CERVICAL;   Surgeon: Aviva Signs, MD;  Location: [facility];  Service: General;  Laterality: N/A;  . Peripheral Vascular Catheterization  07/16/2005   Right verterbral-50% smooth segmental badsilar artery stenosis on right side, Right carotid-50% ulcerated proxmial stenosis, Left carotid-60 to 70% left externam carotid artery stenosis, 90% proximal left ICA stenosis     SOCIAL HISTORY:  Social History   Socioeconomic History  . Marital status: Divorced    Spouse name: Not on file  . Number of children: 2  . Years of education: Not on file  . Highest education level: Not on file  Occupational History  . Not on file  Social Needs  . Financial resource strain: Not hard at all  . Food insecurity    Worry: Never true    Inability: Never true  . Transportation needs    Medical: No    Non-medical: No  Tobacco Use  . Smoking status: Former Smoker    Types: Cigarettes    Quit date: 04/21/1983    Years since quitting: 36.1  . Smokeless tobacco: Never Used  Substance and Sexual Activity  . Alcohol use: No  . Drug use: No  . Sexual activity: Not on file  Lifestyle  . Physical activity    Days per week: 0 days    Minutes per session: 0 min  . Stress: Not at all  Relationships  . Social connections    Talks on phone: Twice a week    Gets together: Twice a week  Attends religious service: More than 4 times per year    Active member of club or organization: No    Attends meetings of clubs or organizations: Never    Relationship status: Divorced  . Intimate partner violence    Fear of current or ex partner: No    Emotionally abused: No    Physically abused: No    Forced sexual activity: No  Other Topics Concern  . Not on file  Social History Narrative  . Not on file    FAMILY HISTORY:  Family History  Problem Relation Age of Onset  . Heart disease Mother   . Hypertension Mother   . Hypertension Father   . Heart disease Father     CURRENT MEDICATIONS:  Outpatient Encounter  Medications as of 05/26/2019  Medication Sig  . naproxen sodium (ALEVE) 220 MG tablet Take 220 mg by mouth 2 (two) times daily as needed.  [name] acetaminophen (TYLENOL) 325 MG tablet Take 325-650 mg by mouth every 6 (six) hours as needed (for headaches or pain).  [name] aspirin 81 MG tablet Take 81 mg by mouth daily.   . carvedilol (COREG) 3.125 MG tablet TAKE 1 TABLET BY MOUTH ONCE DAILY (Patient taking differently: Take 3.125 mg by mouth daily. )  . cyclobenzaprine (FLEXERIL) 5 MG tablet TAKE 1 TABLET BY MOUTH AT BEDTIME AS NEEDED FOR MUSCLE SPASM OR  LEG  CRAMPS (Patient taking differently: Take 5 mg by mouth at bedtime. TAKE 1 TABLET BY MOUTH AT BEDTIME AS NEEDED FOR MUSCLE SPASM OR  LEG  CRAMPS)  . lidocaine (LIDODERM) 5 % Place 1 patch onto the skin daily. Remove & Discard patch within 12 hours or as directed by MD (Patient not taking: Reported on 05/26/2019)  . loratadine (CLARITIN) 10 MG tablet Take 10 mg by mouth daily.  [name] losartan (COZAAR) 50 MG tablet TAKE 1/2 (ONE-HALF) TABLET BY MOUTH ONCE DAILY (Patient taking differently: Take 25 mg by mouth daily. )  . nitroGLYCERIN (NITROSTAT) 0.4 MG SL tablet DISSOLVE ONE TABLET UNDER THE TONGUE AS NEEDED FOR CHEST PAIN**MAX 3 TABLETS EACH 5 MINUTES APART** (Patient taking differently: Place 0.4 mg under the tongue every 5 (five) minutes as needed for chest pain. )  . Omega-3 Krill Oil 500 MG CAPS Take 500 mg by mouth daily.   [name] omeprazole (PRILOSEC) 20 MG capsule Take 1 po daily (Patient taking differently: Take 20 mg by mouth daily as needed (acid reflux). )  . rosuvastatin (CRESTOR) 20 MG tablet Take 1 tablet (20 mg total) by mouth daily. (Patient taking differently: Take 10 mg by mouth 2 (two) times a day. )  . tiZANidine (ZANAFLEX) 4 MG tablet Take 4 mg by mouth.   . traMADol (ULTRAM) 50 MG tablet Take 1 tablet (50 mg total) by mouth every 6 (six) hours as needed. (Patient not taking: Reported on 05/26/2019)  . [DISCONTINUED] HYDROcodone-acetaminophen (NORCO)  5-325 MG tablet Take 0.5-1 tablets by mouth every 6 (six) hours as needed. (Patient not taking: Reported on 05/26/2019)   No facility-administered encounter medications on file as of 05/26/2019.     ALLERGIES:  Allergies  Allergen Reactions  . Penicillins Shortness Of Breath and Other (See Comments)    Has patient had a PCN reaction causing immediate rash, facial/tongue/throat swelling, SOB or lightheadedness with hypotension: Yes Has patient had a PCN reaction causing severe rash involving mucus membranes or skin necrosis: Unk Has patient had a PCN reaction that required hospitalization: Unk Has patient had a PCN reaction  occurring within the last 10 years: Yes "States that she was in ICU after being administered"   . Codeine Nausea And Vomiting  . Lipitor [Atorvastatin] Other (See Comments)    Myalgias  . Lisinopril Cough  . Pravastatin Other (See Comments)    Myalgias     PHYSICAL EXAM:  ECOG Performance status: 1  Vitals:   05/26/19 1057  BP: (!) 150/90  Pulse: 67  Resp: 16  Temp: (!) 97.1 F (36.2 C)  SpO2: 100%   There were no vitals filed for this visit.  Physical Exam Constitutional:      Appearance: Normal appearance. She is normal weight.  Cardiovascular:     Rate and Rhythm: Normal rate and regular rhythm.     Heart sounds: Normal heart sounds.  Pulmonary:     Effort: Pulmonary effort is normal.     Breath sounds: Normal breath sounds.  Abdominal:     General: Bowel sounds are normal.     Palpations: Abdomen is soft.  Musculoskeletal: Normal range of motion.  Skin:    General: Skin is warm and dry.  Neurological:     Mental Status: She is alert and oriented to person, place, and time. Mental status is at baseline.  Psychiatric:        Mood and Affect: Mood normal.        Behavior: Behavior normal.        Thought Content: Thought content normal.        Judgment: Judgment normal.      LABORATORY DATA:  I have reviewed the labs as listed.  CBC     Component Value Date/Time   WBC 6.9 04/29/2019 1427   RBC 3.75 (L) 04/29/2019 1427   HGB 10.8 (L) 04/29/2019 1427   HCT 35.0 (L) 04/29/2019 1427   PLT 359 04/29/2019 1427   MCV 93.3 04/29/2019 1427   MCH 28.8 04/29/2019 1427   MCHC 30.9 04/29/2019 1427   RDW 14.7 04/29/2019 1427   LYMPHSABS 2.7 04/29/2019 1427   MONOABS 0.7 04/29/2019 1427   EOSABS 0.3 04/29/2019 1427   BASOSABS 0.0 04/29/2019 1427   CMP Latest Ref Rng & Units 04/29/2019 12/31/2018 12/23/2018  Glucose 70 - 99 mg/dL 91 62(L) 93  BUN 8 - 23 mg/dL 9 10 7(L)  Creatinine 0.44 - 1.00 mg/dL 0.74 0.83 0.89  Sodium 135 - 145 mmol/L 141 141 142  Potassium 3.5 - 5.1 mmol/L 3.7 3.9 3.6  Chloride 98 - 111 mmol/L 105 108 109  CO2 22 - 32 mmol/L _0 Calcium 8.9 - 10.3 mg/dL 9.3 9.4 8.6(L)  Total Protein 6.5 - 8.1 g/dL 8.0 7.0 -  Total Bilirubin 0.3 - 1.2 mg/dL 0.5 0.4 -  Alkaline Phos 38 - 126 U/L 96 - -  AST 15 - 41 U/L 31 35 -  ALT 0 - 44 U/L 18 19 -       DIAGNOSTIC IMAGING:  I have independently reviewed the scans and discussed with the patient.   ASSESSMENT & PLAN:   Metastatic cancer ([place]) 1.  Metastatic cancer: -Presentation with low back pain, 4 to 6 weeks duration.  MRI of the lumbar spine showed numerous bone lesions involving lumbar spine, right iliac bone worrisome for metastatic disease. -10 to 12 pound weight loss in the last 3 to 6 months, she was trying to lose weight. -Quit smoking cigarettes 25 years ago, smoked 1 pack/day for 15 years. -PET scan on 05/08/2019 showed intensely hypermetabolic right hilar, mediastinal,  right supraclavicular adenopathy.  Solitary small hypermetabolic nodule within the right lung measuring 1 cm, may represent bronchogenic carcinoma.  Focal activity in the right posterior oropharynx would be less likely primary.  No evidence of breast primary.  Widespread hypermetabolic skeletal metastasis involving cervical, thoracic, lumbar spine noted.  Solitary splenic metastasis. -  She quit smoking cigarettes 25 years ago, smoked 1 pack/day for 15 years. - Multiple myeloma work-up was negative.  LDH is elevated. -  She had right supraclavicular lymph node biopsy done on 05/20/2019. -I have called the and obtained preliminary report from the pathology.  This was consistent with metastatic carcinoma.  Immunohistochemical stains are pending at this time.  I will call her daughter and inform her. - I will see her back in 1 week for follow-up.  2.  Low back pain: - We started her on lidocaine patch 12 hours on/12 hours off. -She will use tramadol half tablet as needed.   Total time spent is 25 minutes with with more than 50% of the time spent face-to-face discussing preliminary biopsy results, counseling and coordination of care.  Orders placed this encounter:  No orders of the defined types were placed in this encounter.     Derek Jack, MD [place] [phone]

## 2019-05-28 ENCOUNTER — Inpatient Hospital Stay (HOSPITAL_BASED_OUTPATIENT_CLINIC_OR_DEPARTMENT_OTHER): Payer: Medicare Other | Admitting: Hematology

## 2019-05-28 ENCOUNTER — Other Ambulatory Visit: Payer: Self-pay

## 2019-05-28 ENCOUNTER — Encounter (HOSPITAL_COMMUNITY): Payer: Self-pay | Admitting: Hematology

## 2019-05-28 VITALS — BP 174/74 | HR 71 | Temp 97.8°F | Resp 16 | Wt 174.1 lb

## 2019-05-28 DIAGNOSIS — C7951 Secondary malignant neoplasm of bone: Secondary | ICD-10-CM

## 2019-05-28 DIAGNOSIS — C3491 Malignant neoplasm of unspecified part of right bronchus or lung: Secondary | ICD-10-CM

## 2019-05-28 DIAGNOSIS — Z801 Family history of malignant neoplasm of trachea, bronchus and lung: Secondary | ICD-10-CM | POA: Diagnosis not present

## 2019-05-28 DIAGNOSIS — M545 Low back pain: Secondary | ICD-10-CM

## 2019-05-28 DIAGNOSIS — Z5112 Encounter for antineoplastic immunotherapy: Secondary | ICD-10-CM | POA: Diagnosis not present

## 2019-05-28 DIAGNOSIS — Z5111 Encounter for antineoplastic chemotherapy: Secondary | ICD-10-CM | POA: Diagnosis not present

## 2019-05-28 DIAGNOSIS — Z87891 Personal history of nicotine dependence: Secondary | ICD-10-CM

## 2019-05-28 DIAGNOSIS — C799 Secondary malignant neoplasm of unspecified site: Secondary | ICD-10-CM

## 2019-05-28 NOTE — Assessment & Plan Note (Signed)
1.  Metastatic poorly differentiated squamous cell carcinoma of the lung: -Presentation with low back pain, MRI of the lumbar spine showing numerous bone lesions involving lumbar spine, right iliac bone.  10 to 12 pound weight loss in the last 3 to 6 months, she was trying to lose weight. -Smoked 1 pack/day for 15 years, quit smoking 25 years ago. -PET scan on 05/08/2019 shows intensely hypermetabolic right hilar, mediastinal, right supraclavicular adenopathy with solitary small hypermetabolic nodule in the right lung measuring 1 cm.  Focal activity in the right posterior oropharynx with no CT correlate.  Widespread hypermetabolic skeletal metastasis and solitary splenic metastasis. -Right supraclavicular lymph node biopsy on 05/20/2019 shows metastatic poorly differentiated squamous cell carcinoma, positive for pankeratin, P16, CK 7 and CK 5/6. - I have discussed with the pathologist about this case in detail.  Even the P16 is positive, there is no clear CT correlate for head and neck cancer.  Some cancers of the lung also expressed P16.  PET CT scan findings correlate to the lung primary. - I had a prolonged discussion with the patient about her diagnosis.  I have recommended PDL 1 and foundation 1 testing. - I have recommended MRI of the brain with and without gadolinium for staging purposes. -I have also recommended port placement with Dr. Arnoldo Morale. - We talked about combination chemoimmunotherapy with carboplatin, paclitaxel and pembrolizumab.  If the PDL 1 is more than 50%, she will benefit from single agent or combination immunotherapy. -I will see her back after the MRI of the brain.  She would like to put the port placement on hold at this time.  I will call her daughter and inform her of the new findings.  2.  Low back pain: - She cannot take any narcotics including tramadol as they make her drowsy.  She has used lidocaine patch without help. -She is taking Aleve 2-3 times a day which is helping  the pain. -I have discussed with her various options including radiation therapy for pain control.  She is reluctant to consider at this time.

## 2019-05-28 NOTE — Patient Instructions (Addendum)
[place] at [facility] Discharge Instructions  You were seen today by Dr. Delton Coombes. He went over your recent biopsy results. He will schedule you for a MRI of the brain. He will see you back after your scan for follow up.   Thank you for choosing [place] at [facility] to provide your oncology and hematology care.  To afford each patient quality time with our provider, please arrive at least 15 minutes before your scheduled appointment time.   If you have a lab appointment with the [facility] please come in thru the  Main Entrance and check in at the main information desk  You need to re-schedule your appointment should you arrive 10 or more minutes late.  We strive to give you quality time with our providers, and arriving late affects you and other patients whose appointments are after yours.  Also, if you no show three or more times for appointments you may be dismissed from the clinic at the providers discretion.     Again, thank you for choosing [facility].  Our hope is that these requests will decrease the amount of time that you wait before being seen by our physicians.       _____________________________________________________________  Should you have questions after your visit to [facility], please contact our office at [phone] between the hours of 8:00 a.m. and 4:30 p.m.  Voicemails left after 4:00 p.m. will not be returned until the following business day.  For prescription refill requests, have your pharmacy contact our office and allow 72 hours.    Cancer Center Support Programs:   > Cancer Support Group  2nd Tuesday of the month 1pm-2pm, Journey Room

## 2019-05-28 NOTE — Progress Notes (Signed)
[place], [place] [address]   CLINIC:  Medical Oncology/Hematology  PCP:  [name], MD [address] [place]  [id number] [phone]   REASON FOR VISIT: Metastatic cancer work-up.   INTERVAL HISTORY:  Lindsey Mullins 73 y.o. female seen for follow-up of biopsy results.  She is continuing to have low back pain.  This is controlled fairly well with the Aleve 2-3 times a day.  She has used a lidocaine patch without help.  She cannot take narcotics because of drowsiness.  She rates her pain as 6/10.  Appetite is 75%.  Energy levels are fairly good.  Denies any fevers, night sweats or significant weight loss from last visit.   REVIEW OF SYSTEMS:  Review of Systems  Musculoskeletal: Positive for back pain.  All other systems reviewed and are negative.    PAST MEDICAL/SURGICAL HISTORY:  Past Medical History:  Diagnosis Date  . Atypical chest pain 05/23/2012   STRESS TEST - small to moderate sized area of partial reversibility of the anteroapical wall, most likely breast artifact, post-stress EF 67%, EKG show NSR at 65, No Lexiscan EKG changes, non-diagnostic for ischemia; STRESS TEST, 01/25/2010 - normal study, post-stress EF 66%, no significant ischemia  . Cerebral atherosclerosis 09/29/2012   CAROTID DUPLEX - RIGHT  BULB/PROXIMAL ICA-moderate amount of fibrous plaque, 50-69% diameter reduction; LEFT CEA-normal, no significant diameter reduction  . Colitis   . GERD (gastroesophageal reflux disease)   . Hyperlipidemia   . Hypertension   . Osteopenia   . Restless leg syndrome   . Shortness of breath 03/30/2005   2D ECHO - EF >55%, normal  . TIA (transient ischemic attack) 01/27/2010   2D ECHO - EF 65%, normal   Past Surgical History:  Procedure Laterality Date  . ABDOMINAL EXPLORATION SURGERY    . CAROTID ENDARTERECTOMY    . LYMPH NODE BIOPSY N/A 05/20/2019   Procedure: LYMPH NODE BIOPSY CERVICAL;  Surgeon: Aviva Signs,  MD;  Location: [facility];  Service: General;  Laterality: N/A;  . Peripheral Vascular Catheterization  07/16/2005   Right verterbral-50% smooth segmental badsilar artery stenosis on right side, Right carotid-50% ulcerated proxmial stenosis, Left carotid-60 to 70% left externam carotid artery stenosis, 90% proximal left ICA stenosis     SOCIAL HISTORY:  Social History   Socioeconomic History  . Marital status: Divorced    Spouse name: Not on file  . Number of children: 2  . Years of education: Not on file  . Highest education level: Not on file  Occupational History  . Not on file  Social Needs  . Financial resource strain: Not hard at all  . Food insecurity    Worry: Never true    Inability: Never true  . Transportation needs    Medical: No    Non-medical: No  Tobacco Use  . Smoking status: Former Smoker    Types: Cigarettes    Quit date: 04/21/1983    Years since quitting: 36.1  . Smokeless tobacco: Never Used  Substance and Sexual Activity  . Alcohol use: No  . Drug use: No  . Sexual activity: Not on file  Lifestyle  . Physical activity    Days per week: 0 days    Minutes per session: 0 min  . Stress: Not at all  Relationships  . Social connections    Talks on phone: Twice a week    Gets together: Twice a week  Attends religious service: More than 4 times per year    Active member of club or organization: No    Attends meetings of clubs or organizations: Never    Relationship status: Divorced  . Intimate partner violence    Fear of current or ex partner: No    Emotionally abused: No    Physically abused: No    Forced sexual activity: No  Other Topics Concern  . Not on file  Social History Narrative  . Not on file    FAMILY HISTORY:  Family History  Problem Relation Age of Onset  . Heart disease Mother   . Hypertension Mother   . Hypertension Father   . Heart disease Father     CURRENT MEDICATIONS:  Outpatient Encounter Medications as of 05/28/2019   Medication Sig  . acetaminophen (TYLENOL) 325 MG tablet Take 325-650 mg by mouth every 6 (six) hours as needed (for headaches or pain).  [name] aspirin 81 MG tablet Take 81 mg by mouth daily.   . carvedilol (COREG) 3.125 MG tablet TAKE 1 TABLET BY MOUTH ONCE DAILY (Patient taking differently: Take 3.125 mg by mouth daily. )  . cyclobenzaprine (FLEXERIL) 5 MG tablet TAKE 1 TABLET BY MOUTH AT BEDTIME AS NEEDED FOR MUSCLE SPASM OR  LEG  CRAMPS (Patient taking differently: Take 5 mg by mouth at bedtime. TAKE 1 TABLET BY MOUTH AT BEDTIME AS NEEDED FOR MUSCLE SPASM OR  LEG  CRAMPS)  . lidocaine (LIDODERM) 5 % Place 1 patch onto the skin daily. Remove & Discard patch within 12 hours or as directed by MD  . loratadine (CLARITIN) 10 MG tablet Take 10 mg by mouth daily.  [name] losartan (COZAAR) 50 MG tablet TAKE 1/2 (ONE-HALF) TABLET BY MOUTH ONCE DAILY (Patient taking differently: Take 25 mg by mouth daily. )  . naproxen sodium (ALEVE) 220 MG tablet Take 220 mg by mouth 2 (two) times daily as needed.  . nitroGLYCERIN (NITROSTAT) 0.4 MG SL tablet DISSOLVE ONE TABLET UNDER THE TONGUE AS NEEDED FOR CHEST PAIN**MAX 3 TABLETS EACH 5 MINUTES APART** (Patient taking differently: Place 0.4 mg under the tongue every 5 (five) minutes as needed for chest pain. )  . Omega-3 Krill Oil 500 MG CAPS Take 500 mg by mouth daily.   [name] omeprazole (PRILOSEC) 20 MG capsule Take 1 po daily (Patient taking differently: Take 20 mg by mouth daily as needed (acid reflux). )  . rosuvastatin (CRESTOR) 20 MG tablet Take 1 tablet (20 mg total) by mouth daily. (Patient taking differently: Take 10 mg by mouth 2 (two) times a day. )  . tiZANidine (ZANAFLEX) 4 MG tablet Take 4 mg by mouth.   . traMADol (ULTRAM) 50 MG tablet Take 1 tablet (50 mg total) by mouth every 6 (six) hours as needed.   No facility-administered encounter medications on file as of 05/28/2019.     ALLERGIES:  Allergies  Allergen Reactions  . Penicillins Shortness Of Breath and  Other (See Comments)    Has patient had a PCN reaction causing immediate rash, facial/tongue/throat swelling, SOB or lightheadedness with hypotension: Yes Has patient had a PCN reaction causing severe rash involving mucus membranes or skin necrosis: Unk Has patient had a PCN reaction that required hospitalization: Unk Has patient had a PCN reaction occurring within the last 10 years: Yes "States that she was in ICU after being administered"   . Codeine Nausea And Vomiting  . Lipitor [Atorvastatin] Other (See Comments)    Myalgias  . Lisinopril  Cough  . Pravastatin Other (See Comments)    Myalgias     PHYSICAL EXAM:  ECOG Performance status: 1  Vitals:   05/28/19 1600  BP: (!) 174/74  Pulse: 71  Resp: 16  Temp: 97.8 F (36.6 C)  SpO2: 100%   Filed Weights   05/28/19 1600  Weight: 174 lb 2 oz (79 kg)    Physical Exam Constitutional:      Appearance: Normal appearance. She is normal weight.  Cardiovascular:     Rate and Rhythm: Normal rate and regular rhythm.     Heart sounds: Normal heart sounds.  Pulmonary:     Effort: Pulmonary effort is normal.     Breath sounds: Normal breath sounds.  Abdominal:     General: Bowel sounds are normal.     Palpations: Abdomen is soft.  Musculoskeletal: Normal range of motion.  Skin:    General: Skin is warm and dry.  Neurological:     Mental Status: She is alert and oriented to person, place, and time. Mental status is at baseline.  Psychiatric:        Mood and Affect: Mood normal.        Behavior: Behavior normal.        Thought Content: Thought content normal.        Judgment: Judgment normal.      LABORATORY DATA:  I have reviewed the labs as listed.  CBC    Component Value Date/Time   WBC 6.9 04/29/2019 1427   RBC 3.75 (L) 04/29/2019 1427   HGB 10.8 (L) 04/29/2019 1427   HCT 35.0 (L) 04/29/2019 1427   PLT 359 04/29/2019 1427   MCV 93.3 04/29/2019 1427   MCH 28.8 04/29/2019 1427   MCHC 30.9 04/29/2019 1427    RDW 14.7 04/29/2019 1427   LYMPHSABS 2.7 04/29/2019 1427   MONOABS 0.7 04/29/2019 1427   EOSABS 0.3 04/29/2019 1427   BASOSABS 0.0 04/29/2019 1427   CMP Latest Ref Rng & Units 04/29/2019 12/31/2018 12/23/2018  Glucose 70 - 99 mg/dL 91 62(L) 93  BUN 8 - 23 mg/dL 9 10 7(L)  Creatinine 0.44 - 1.00 mg/dL 0.74 0.83 0.89  Sodium 135 - 145 mmol/L 141 141 142  Potassium 3.5 - 5.1 mmol/L 3.7 3.9 3.6  Chloride 98 - 111 mmol/L 105 108 109  CO2 22 - 32 mmol/L 23 21 22   Calcium 8.9 - 10.3 mg/dL 9.3 9.4 8.6(L)  Total Protein 6.5 - 8.1 g/dL 8.0 7.0 -  Total Bilirubin 0.3 - 1.2 mg/dL 0.5 0.4 -  Alkaline Phos 38 - 126 U/L 96 - -  AST 15 - 41 U/L 31 35 -  ALT 0 - 44 U/L 18 19 -       DIAGNOSTIC IMAGING:  I have independently reviewed the scans and discussed with the patient.   ASSESSMENT & PLAN:   Squamous cell carcinoma of lung, stage IV, right ([place]) 1.  Metastatic poorly differentiated squamous cell carcinoma of the lung: -Presentation with low back pain, MRI of the lumbar spine showing numerous bone lesions involving lumbar spine, right iliac bone.  10 to 12 pound weight loss in the last 3 to 6 months, she was trying to lose weight. -Smoked 1 pack/day for 15 years, quit smoking 25 years ago. -PET scan on 05/08/2019 shows intensely hypermetabolic right hilar, mediastinal, right supraclavicular adenopathy with solitary small hypermetabolic nodule in the right lung measuring 1 cm.  Focal activity in the right posterior oropharynx with no CT correlate.  Widespread hypermetabolic skeletal metastasis and solitary splenic metastasis. -Right supraclavicular lymph node biopsy on 05/20/2019 shows metastatic poorly differentiated squamous cell carcinoma, positive for pankeratin, P16, CK 7 and CK 5/6. - I have discussed with the pathologist about this case in detail.  Even the P16 is positive, there is no clear CT correlate for head and neck cancer.  Some cancers of the lung also expressed P16.  PET CT scan  findings correlate to the lung primary. - I had a prolonged discussion with the patient about her diagnosis.  I have recommended PDL 1 and foundation 1 testing. - I have recommended MRI of the brain with and without gadolinium for staging purposes. -I have also recommended port placement with Dr. Arnoldo Morale. - We talked about combination chemoimmunotherapy with carboplatin, paclitaxel and pembrolizumab.  If the PDL 1 is more than 50%, she will benefit from single agent or combination immunotherapy. -I will see her back after the MRI of the brain.  She would like to put the port placement on hold at this time.  I will call her daughter and inform her of the new findings.  2.  Low back pain: - She cannot take any narcotics including tramadol as they make her drowsy.  She has used lidocaine patch without help. -She is taking Aleve 2-3 times a day which is helping the pain. -I have discussed with her various options including radiation therapy for pain control.  She is reluctant to consider at this time.     Total time spent is 40 minutes with more than 50% of the time spent face-to-face discussing biopsy results, further management plan, counseling and coordination of care.  Orders placed this encounter:  Orders Placed This Encounter  Procedures  . MR [place], MD [place] [phone]

## 2019-05-29 ENCOUNTER — Other Ambulatory Visit (HOSPITAL_COMMUNITY): Payer: Self-pay | Admitting: Nurse Practitioner

## 2019-05-29 DIAGNOSIS — C799 Secondary malignant neoplasm of unspecified site: Secondary | ICD-10-CM

## 2019-06-01 ENCOUNTER — Encounter (HOSPITAL_COMMUNITY): Payer: Self-pay | Admitting: *Deleted

## 2019-06-01 NOTE — Progress Notes (Signed)
I spoke with Lindsey Mullins in pathology and ordered foundation one and PDL1 on accession # [id number].  Stage IV, Dx: C34.91.

## 2019-06-05 ENCOUNTER — Other Ambulatory Visit: Payer: Self-pay

## 2019-06-05 ENCOUNTER — Ambulatory Visit (HOSPITAL_COMMUNITY)
Admission: RE | Admit: 2019-06-05 | Discharge: 2019-06-05 | Disposition: A | Discharge status: Home / Self Care | Payer: Medicare Other | Source: Ambulatory Visit | Attending: Nurse Practitioner | Admitting: Nurse Practitioner

## 2019-06-05 DIAGNOSIS — C799 Secondary malignant neoplasm of unspecified site: Secondary | ICD-10-CM | POA: Diagnosis present

## 2019-06-05 MED ORDER — GADOBUTROL 1 MMOL/ML IV SOLN
7.5000 mL | Freq: Once | INTRAVENOUS | Status: AC | PRN
  Administered 2019-06-05: 7.5 mL via INTRAVENOUS

## 2019-06-08 ENCOUNTER — Encounter (HOSPITAL_COMMUNITY): Payer: Self-pay | Admitting: Hematology

## 2019-06-08 ENCOUNTER — Inpatient Hospital Stay (HOSPITAL_BASED_OUTPATIENT_CLINIC_OR_DEPARTMENT_OTHER): Payer: Medicare Other | Admitting: Hematology

## 2019-06-08 ENCOUNTER — Other Ambulatory Visit: Payer: Self-pay

## 2019-06-08 DIAGNOSIS — C3491 Malignant neoplasm of unspecified part of right bronchus or lung: Secondary | ICD-10-CM

## 2019-06-08 DIAGNOSIS — C801 Malignant (primary) neoplasm, unspecified: Secondary | ICD-10-CM

## 2019-06-08 DIAGNOSIS — C7951 Secondary malignant neoplasm of bone: Secondary | ICD-10-CM | POA: Diagnosis not present

## 2019-06-08 DIAGNOSIS — Z5112 Encounter for antineoplastic immunotherapy: Secondary | ICD-10-CM

## 2019-06-08 DIAGNOSIS — Z5111 Encounter for antineoplastic chemotherapy: Secondary | ICD-10-CM

## 2019-06-08 DIAGNOSIS — Z7189 Other specified counseling: Secondary | ICD-10-CM

## 2019-06-08 DIAGNOSIS — M545 Low back pain: Secondary | ICD-10-CM

## 2019-06-08 DIAGNOSIS — Z87891 Personal history of nicotine dependence: Secondary | ICD-10-CM

## 2019-06-08 NOTE — Assessment & Plan Note (Addendum)
1.  Metastatic poorly differentiated squamous cell carcinoma of the lung: - PDL 1 TPS-10% - 15-pack-year smoker, quit smoking 25 years ago.  10 to 12 pound weight loss in the last 3 to 6 months, she was trying to lose weight.  Presentation with low back pain with MRI of the lumbar spine showing numerous bone lesions involving the lumbar spine and right iliac bone. -PET scan on 05/08/2019 shows intensely hypermetabolic right hilar, mediastinal, right supraclavicular adenopathy with solitary small hypermetabolic nodule in the right lung measuring 1 cm.  Focal activity in the right posterior oropharynx with no CT correlate.  Widespread hypermetabolic skeletal metastasis and solitary splenic metastasis. -Right supraclavicular lymph node biopsy on 05/20/2019 shows metastatic poorly differentiated squamous cell carcinoma, positive for pankeratin, P 16, CK 7 and CK 5/6. - Foundation 1 test results are pending.  I discussed about the PDL 1 test results.  MRI of the brain on 06/05/2019 was negative for metastatic disease. -I have recommended chemoimmunotherapy with carboplatin, paclitaxel and pembrolizumab.  We discussed about side effects and the schedule in detail. -She will be referred for port placement.  We will start treatment after the port placement.  2.  Low back pain: - She cannot take any narcotics including tramadol as they make her drowsy.  Lidocaine patch did not help. -She will continue Aleve at this time.  She will have a steroid injection to the back on 06/09/2019. -We have also discussed possibility of palliative radiation if no improvement in pain.

## 2019-06-08 NOTE — Patient Instructions (Signed)
[place] at [facility] Discharge Instructions  You were seen today by Dr. Delton Coombes. He went over your recent scan results. He will schedule you for port placement. He will see you back after port placement for labs, treatment and follow up.   Thank you for choosing [place] at [facility] to provide your oncology and hematology care.  To afford each patient quality time with our provider, please arrive at least 15 minutes before your scheduled appointment time.   If you have a lab appointment with the [facility] please come in thru the  Main Entrance and check in at the main information desk  You need to re-schedule your appointment should you arrive 10 or more minutes late.  We strive to give you quality time with our providers, and arriving late affects you and other patients whose appointments are after yours.  Also, if you no show three or more times for appointments you may be dismissed from the clinic at the providers discretion.     Again, thank you for choosing [facility].  Our hope is that these requests will decrease the amount of time that you wait before being seen by our physicians.       _____________________________________________________________  Should you have questions after your visit to [facility], please contact our office at [phone] between the hours of 8:00 a.m. and 4:30 p.m.  Voicemails left after 4:00 p.m. will not be returned until the following business day.  For prescription refill requests, have your pharmacy contact our office and allow 72 hours.    Cancer Center Support Programs:   > Cancer Support Group  2nd Tuesday of the month 1pm-2pm, Journey Room

## 2019-06-08 NOTE — Progress Notes (Signed)
I met with patient during visit with Dr. Delton Coombes.  I helped to further explain the purpose of a port a cath and a generalized overview of how it is placed. I explained to her that the surgeon would explain further at the consult appointment.  I provided her with written information on carboplatin, paclitaxel and Atezolizumab.  I explained to her that she will get additional education but if she had any questions she was welcome to call me.  I provided her with my contact information again.  She verbalizes understanding of all that was provided to her today.

## 2019-06-08 NOTE — Progress Notes (Signed)
[place], [place] [address]   CLINIC:  Medical Oncology/Hematology  PCP:  [name], MD [address] [place] [address] [phone]   REASON FOR VISIT: Metastatic cancer work-up.   INTERVAL HISTORY:  Ms. Bennion 73 y.o. female seen for follow-up of MRI of the brain and pathology reports.  She continues to have low back pain.  She is taking Aleve 2-3 times a day.  Appetite is 75%.  Energy levels are 75%.  Pain is reported as 9 out of 10 in the lower back region.  Does not radiate down the legs.  She is having sleep problems secondary to pain.  She has tried narcotics in the past but does not like them as they make her drowsy.  Denies any nausea, vomiting, diarrhea or constipation.  Denies any ER visits or hospitalizations.   REVIEW OF SYSTEMS:  Review of Systems  Musculoskeletal: Positive for back pain.  Psychiatric/Behavioral: Positive for sleep disturbance.  All other systems reviewed and are negative.    PAST MEDICAL/SURGICAL HISTORY:  Past Medical History:  Diagnosis Date  . Atypical chest pain 05/23/2012   STRESS TEST - small to moderate sized area of partial reversibility of the anteroapical wall, most likely breast artifact, post-stress EF 67%, EKG show NSR at 65, No Lexiscan EKG changes, non-diagnostic for ischemia; STRESS TEST, 01/25/2010 - normal study, post-stress EF 66%, no significant ischemia  . Cerebral atherosclerosis 09/29/2012   CAROTID DUPLEX - RIGHT  BULB/PROXIMAL ICA-moderate amount of fibrous plaque, 50-69% diameter reduction; LEFT CEA-normal, no significant diameter reduction  . Colitis   . GERD (gastroesophageal reflux disease)   . Hyperlipidemia   . Hypertension   . Osteopenia   . Restless leg syndrome   . Shortness of breath 03/30/2005   2D ECHO - EF >55%, normal  . TIA (transient ischemic attack) 01/27/2010   2D ECHO - EF 65%, normal   Past Surgical History:  Procedure Laterality Date   . ABDOMINAL EXPLORATION SURGERY    . CAROTID ENDARTERECTOMY    . LYMPH NODE BIOPSY N/A 05/20/2019   Procedure: LYMPH NODE BIOPSY CERVICAL;  Surgeon: Aviva Signs, MD;  Location: [facility];  Service: General;  Laterality: N/A;  . Peripheral Vascular Catheterization  07/16/2005   Right verterbral-50% smooth segmental badsilar artery stenosis on right side, Right carotid-50% ulcerated proxmial stenosis, Left carotid-60 to 70% left externam carotid artery stenosis, 90% proximal left ICA stenosis     SOCIAL HISTORY:  Social History   Socioeconomic History  . Marital status: Divorced    Spouse name: Not on file  . Number of children: 2  . Years of education: Not on file  . Highest education level: Not on file  Occupational History  . Not on file  Social Needs  . Financial resource strain: Not hard at all  . Food insecurity    Worry: Never true    Inability: Never true  . Transportation needs    Medical: No    Non-medical: No  Tobacco Use  . Smoking status: Former Smoker    Types: Cigarettes    Quit date: 04/21/1983    Years since quitting: 36.1  . Smokeless tobacco: Never Used  Substance and Sexual Activity  . Alcohol use: No  . Drug use: No  . Sexual activity: Not on file  Lifestyle  . Physical activity    Days per week: 0 days    Minutes per session: 0  min  . Stress: Not at all  Relationships  . Social Herbalist on phone: Twice a week    Gets together: Twice a week    Attends religious service: More than 4 times per year    Active member of club or organization: No    Attends meetings of clubs or organizations: Never    Relationship status: Divorced  . Intimate partner violence    Fear of current or ex partner: No    Emotionally abused: No    Physically abused: No    Forced sexual activity: No  Other Topics Concern  . Not on file  Social History Narrative  . Not on file    FAMILY HISTORY:  Family History  Problem Relation Age of Onset  . Heart disease  Mother   . Hypertension Mother   . Hypertension Father   . Heart disease Father     CURRENT MEDICATIONS:  Outpatient Encounter Medications as of 06/08/2019  Medication Sig  . acetaminophen (TYLENOL) 325 MG tablet Take 325-650 mg by mouth every 6 (six) hours as needed (for headaches or pain).  [name] aspirin 81 MG tablet Take 81 mg by mouth daily.   . carvedilol (COREG) 3.125 MG tablet TAKE 1 TABLET BY MOUTH ONCE DAILY (Patient taking differently: Take 3.125 mg by mouth daily. )  . cyclobenzaprine (FLEXERIL) 5 MG tablet TAKE 1 TABLET BY MOUTH AT BEDTIME AS NEEDED FOR MUSCLE SPASM OR  LEG  CRAMPS (Patient taking differently: Take 5 mg by mouth at bedtime. TAKE 1 TABLET BY MOUTH AT BEDTIME AS NEEDED FOR MUSCLE SPASM OR  LEG  CRAMPS)  . lidocaine (LIDODERM) 5 % Place 1 patch onto the skin daily. Remove & Discard patch within 12 hours or as directed by MD  . loratadine (CLARITIN) 10 MG tablet Take 10 mg by mouth daily.  [name] losartan (COZAAR) 50 MG tablet TAKE 1/2 (ONE-HALF) TABLET BY MOUTH ONCE DAILY (Patient taking differently: Take 25 mg by mouth daily. )  . naproxen sodium (ALEVE) 220 MG tablet Take 220 mg by mouth 2 (two) times daily as needed.  . nitroGLYCERIN (NITROSTAT) 0.4 MG SL tablet DISSOLVE ONE TABLET UNDER THE TONGUE AS NEEDED FOR CHEST PAIN**MAX 3 TABLETS EACH 5 MINUTES APART** (Patient taking differently: Place 0.4 mg under the tongue every 5 (five) minutes as needed for chest pain. )  . Omega-3 Krill Oil 500 MG CAPS Take 500 mg by mouth daily.   [name] omeprazole (PRILOSEC) 20 MG capsule Take 1 po daily (Patient taking differently: Take 20 mg by mouth daily as needed (acid reflux). )  . rosuvastatin (CRESTOR) 20 MG tablet Take 1 tablet (20 mg total) by mouth daily. (Patient taking differently: Take 10 mg by mouth 2 (two) times a day. )  . tiZANidine (ZANAFLEX) 4 MG tablet Take 4 mg by mouth.   . traMADol (ULTRAM) 50 MG tablet Take 1 tablet (50 mg total) by mouth every 6 (six) hours as needed.    No facility-administered encounter medications on file as of 06/08/2019.     ALLERGIES:  Allergies  Allergen Reactions  . Penicillins Shortness Of Breath and Other (See Comments)    Has patient had a PCN reaction causing immediate rash, facial/tongue/throat swelling, SOB or lightheadedness with hypotension: Yes Has patient had a PCN reaction causing severe rash involving mucus membranes or skin necrosis: Unk Has patient had a PCN reaction that required hospitalization: Unk Has patient had a PCN reaction occurring within the last  10 years: Yes "States that she was in ICU after being administered"   . Codeine Nausea And Vomiting  . Lipitor [Atorvastatin] Other (See Comments)    Myalgias  . Lisinopril Cough  . Pravastatin Other (See Comments)    Myalgias     PHYSICAL EXAM:  ECOG Performance status: 1  Vitals:   06/08/19 1142  BP: (!) 145/66  Pulse: 78  Resp: 18  Temp: (!) 97.1 F (36.2 C)  SpO2: 100%   Filed Weights   06/08/19 1142  Weight: 172 lb 11.2 oz (78.3 kg)    Physical Exam Constitutional:      Appearance: Normal appearance. She is normal weight.  Cardiovascular:     Rate and Rhythm: Normal rate and regular rhythm.     Heart sounds: Normal heart sounds.  Pulmonary:     Effort: Pulmonary effort is normal.     Breath sounds: Normal breath sounds.  Abdominal:     General: Bowel sounds are normal.     Palpations: Abdomen is soft.  Musculoskeletal: Normal range of motion.  Skin:    General: Skin is warm and dry.  Neurological:     Mental Status: She is alert and oriented to person, place, and time. Mental status is at baseline.  Psychiatric:        Mood and Affect: Mood normal.        Behavior: Behavior normal.        Thought Content: Thought content normal.        Judgment: Judgment normal.      LABORATORY DATA:  I have reviewed the labs as listed.  CBC    Component Value Date/Time   WBC 6.9 04/29/2019 1427   RBC 3.75 (L) 04/29/2019 1427   HGB  10.8 (L) 04/29/2019 1427   HCT 35.0 (L) 04/29/2019 1427   PLT 359 04/29/2019 1427   MCV 93.3 04/29/2019 1427   MCH 28.8 04/29/2019 1427   MCHC 30.9 04/29/2019 1427   RDW 14.7 04/29/2019 1427   LYMPHSABS 2.7 04/29/2019 1427   MONOABS 0.7 04/29/2019 1427   EOSABS 0.3 04/29/2019 1427   BASOSABS 0.0 04/29/2019 1427   CMP Latest Ref Rng & Units 04/29/2019 12/31/2018 12/23/2018  Glucose 70 - 99 mg/dL 91 62(L) 93  BUN 8 - 23 mg/dL 9 10 7(L)  Creatinine 0.44 - 1.00 mg/dL 0.74 0.83 0.89  Sodium 135 - 145 mmol/L 141 141 142  Potassium 3.5 - 5.1 mmol/L 3.7 3.9 3.6  Chloride 98 - 111 mmol/L 105 108 109  CO2 22 - 32 mmol/L 23 21 22   Calcium 8.9 - 10.3 mg/dL 9.3 9.4 8.6(L)  Total Protein 6.5 - 8.1 g/dL 8.0 7.0 -  Total Bilirubin 0.3 - 1.2 mg/dL 0.5 0.4 -  Alkaline Phos 38 - 126 U/L 96 - -  AST 15 - 41 U/L 31 35 -  ALT 0 - 44 U/L 18 19 -       DIAGNOSTIC IMAGING:  I have independently reviewed the scans and discussed with the patient.   ASSESSMENT & PLAN:   Squamous cell carcinoma of lung, stage IV, right ([place]) 1.  Metastatic poorly differentiated squamous cell carcinoma of the lung: - PDL 1 TPS-10% - 15-pack-year smoker, quit smoking 25 years ago.  10 to 12 pound weight loss in the last 3 to 6 months, she was trying to lose weight.  Presentation with low back pain with MRI of the lumbar spine showing numerous bone lesions involving the lumbar spine and right  iliac bone. -PET scan on 05/08/2019 shows intensely hypermetabolic right hilar, mediastinal, right supraclavicular adenopathy with solitary small hypermetabolic nodule in the right lung measuring 1 cm.  Focal activity in the right posterior oropharynx with no CT correlate.  Widespread hypermetabolic skeletal metastasis and solitary splenic metastasis. -Right supraclavicular lymph node biopsy on 05/20/2019 shows metastatic poorly differentiated squamous cell carcinoma, positive for pankeratin, P 16, CK 7 and CK 5/6. - Foundation 1 test  results are pending.  I discussed about the PDL 1 test results.  MRI of the brain on 06/05/2019 was negative for metastatic disease. -I have recommended chemoimmunotherapy with carboplatin, paclitaxel and pembrolizumab.  We discussed about side effects and the schedule in detail. -She will be referred for port placement.  We will start treatment after the port placement.  2.  Low back pain: - She cannot take any narcotics including tramadol as they make her drowsy.  Lidocaine patch did not help. -She will continue Aleve at this time.  She will have a steroid injection to the back on 06/09/2019. -We have also discussed possibility of palliative radiation if no improvement in pain.  Total time spent is 40 minutes with more than 50% of the time spent face-to-face discussing PDL 1 results, chemotherapy plan, counseling and coordination of care.  Orders placed this encounter:  No orders of the defined types were placed in this encounter.     Derek Jack, MD [place] [phone]

## 2019-06-09 ENCOUNTER — Telehealth: Payer: Self-pay | Admitting: Physical Medicine and Rehabilitation

## 2019-06-09 ENCOUNTER — Ambulatory Visit (INDEPENDENT_AMBULATORY_CARE_PROVIDER_SITE_OTHER): Payer: Medicare Other | Admitting: Physical Medicine and Rehabilitation

## 2019-06-09 ENCOUNTER — Encounter: Payer: Self-pay | Admitting: Physical Medicine and Rehabilitation

## 2019-06-09 ENCOUNTER — Ambulatory Visit: Payer: Self-pay

## 2019-06-09 ENCOUNTER — Encounter (HOSPITAL_COMMUNITY): Payer: Self-pay

## 2019-06-09 VITALS — BP 159/86 | HR 68

## 2019-06-09 DIAGNOSIS — S32020S Wedge compression fracture of second lumbar vertebra, sequela: Secondary | ICD-10-CM

## 2019-06-09 DIAGNOSIS — M5116 Intervertebral disc disorders with radiculopathy, lumbar region: Secondary | ICD-10-CM | POA: Diagnosis not present

## 2019-06-09 DIAGNOSIS — C799 Secondary malignant neoplasm of unspecified site: Secondary | ICD-10-CM

## 2019-06-09 MED ORDER — METHYLPREDNISOLONE ACETATE 80 MG/ML IJ SUSP
80.0000 mg | Freq: Once | INTRAMUSCULAR | Status: AC
  Administered 2019-06-09: 14:00:00 80 mg

## 2019-06-09 MED ORDER — LIDOCAINE-PRILOCAINE 2.5-2.5 % EX CREA: 1.0000 "application " | TOPICAL_CREAM | CUTANEOUS | 3 refills | Status: DC | PRN

## 2019-06-09 MED ORDER — PROCHLORPERAZINE MALEATE 10 MG PO TABS: 10.0000 mg | ORAL_TABLET | Freq: Four times a day (QID) | ORAL | 2 refills | Status: DC | PRN

## 2019-06-09 NOTE — H&P (Signed)
Lindsey Mullins; [id number]; 06-18-1946   HPI Patient is a 73 year old black female who was referred to my care by Dr. Delton Coombes for a a Port-A-Cath insertion.  Patient was recently found to have a compression fracture in the back secondary to a lytic bone lesion.  She underwent a PET scan which reveals generalized lymphadenopathy.  There is also suggestion of a right lung carcinoma.  She also has a lesion in the left breast on mammogram.  She has been referred for a lymph node biopsy of the right supraclavicular region.  Complains of back pain.  Recent biopsy does confirm metastatic right lung cancer. Past Medical History:  Diagnosis Date  . Atypical chest pain 05/23/2012   STRESS TEST - small to moderate sized area of partial reversibility of the anteroapical wall, most likely breast artifact, post-stress EF 67%, EKG show NSR at 65, No Lexiscan EKG changes, non-diagnostic for ischemia; STRESS TEST, 01/25/2010 - normal study, post-stress EF 66%, no significant ischemia  . Cerebral atherosclerosis 09/29/2012   CAROTID DUPLEX - RIGHT  BULB/PROXIMAL ICA-moderate amount of fibrous plaque, 50-69% diameter reduction; LEFT CEA-normal, no significant diameter reduction  . Colitis   . GERD (gastroesophageal reflux disease)   . Hyperlipidemia   . Hypertension   . Osteopenia   . Restless leg syndrome   . Shortness of breath 03/30/2005   2D ECHO - EF >55%, normal  . TIA (transient ischemic attack) 01/27/2010   2D ECHO - EF 65%, normal    Past Surgical History:  Procedure Laterality Date  . ABDOMINAL EXPLORATION SURGERY    . CAROTID ENDARTERECTOMY    . Peripheral Vascular Catheterization  07/16/2005   Right verterbral-50% smooth segmental badsilar artery stenosis on right side, Right carotid-50% ulcerated proxmial stenosis, Left carotid-60 to 70% left externam carotid artery stenosis, 90% proximal left ICA stenosis    Family History  Problem Relation Age of Onset  . Heart disease Mother   .  Hypertension Mother   . Hypertension Father   . Heart disease Father     Current Outpatient Medications on File Prior to Visit  Medication Sig Dispense Refill  . acetaminophen (TYLENOL) 325 MG tablet Take 325-650 mg by mouth every 6 (six) hours as needed (for headaches or pain).    [name] aspirin 81 MG tablet Take 81 mg by mouth 2 (two) times daily.     . carvedilol (COREG) 3.125 MG tablet TAKE 1 TABLET BY MOUTH ONCE DAILY (Patient taking differently: Take 3.125 mg by mouth daily. ) 90 tablet 2  . cyclobenzaprine (FLEXERIL) 5 MG tablet TAKE 1 TABLET BY MOUTH AT BEDTIME AS NEEDED FOR MUSCLE SPASM OR  LEG  CRAMPS 90 tablet 0  . HYDROcodone-acetaminophen (NORCO) 5-325 MG tablet Take 0.5-1 tablets by mouth every 6 (six) hours as needed. 10 tablet 0  . lidocaine (LIDODERM) 5 % Place 1 patch onto the skin daily. Remove & Discard patch within 12 hours or as directed by MD 30 patch 0  . loratadine (CLARITIN) 10 MG tablet Take 10 mg by mouth daily.    [name] losartan (COZAAR) 50 MG tablet TAKE 1/2 (ONE-HALF) TABLET BY MOUTH ONCE DAILY (Patient taking differently: Take 25 mg by mouth daily. ) 30 tablet 8  . nitroGLYCERIN (NITROSTAT) 0.4 MG SL tablet DISSOLVE ONE TABLET UNDER THE TONGUE AS NEEDED FOR CHEST PAIN**MAX 3 TABLETS EACH 5 MINUTES APART** 25 tablet 3  . Omega-3 Krill Oil 500 MG CAPS Take 500 mg by mouth daily.     [name] omeprazole (  PRILOSEC) 20 MG capsule Take 1 po daily 90 capsule 2  . rosuvastatin (CRESTOR) 20 MG tablet Take 1 tablet (20 mg total) by mouth daily. 90 tablet 3  . tiZANidine (ZANAFLEX) 4 MG tablet Take 1 tablet (4 mg total) by mouth every 6 (six) hours as needed for muscle spasms. (Patient taking differently: Take 2 mg by mouth every 6 (six) hours as needed for muscle spasms. ) 30 tablet 1   No current facility-administered medications on file prior to visit.     Allergies  Allergen Reactions  . Penicillins Shortness Of Breath and Other (See Comments)    Has patient had a PCN reaction  causing immediate rash, facial/tongue/throat swelling, SOB or lightheadedness with hypotension: Yes Has patient had a PCN reaction causing severe rash involving mucus membranes or skin necrosis: Unk Has patient had a PCN reaction that required hospitalization: Unk Has patient had a PCN reaction occurring within the last 10 years: Yes "States that she was in ICU after being administered"   . Codeine Nausea And Vomiting  . Lipitor [Atorvastatin] Other (See Comments)    Myalgias  . Lisinopril Cough  . Pravastatin Other (See Comments)    Myalgias    Social History   Substance and Sexual Activity  Alcohol Use No    Social History   Tobacco Use  Smoking Status Former Smoker  . Types: Cigarettes  . Quit date: 04/21/1983  . Years since quitting: 36.0  Smokeless Tobacco Never Used    Review of Systems  Constitutional: Negative.   HENT: Negative.   Eyes: Negative.   Respiratory: Negative.   Cardiovascular: Negative.   Gastrointestinal: Negative.   Genitourinary: Negative.   Musculoskeletal: Positive for back pain.  Skin: Negative.   Neurological: Negative.   Endo/Heme/Allergies: Negative.   Psychiatric/Behavioral: Negative.     Objective   Vitals:   05/14/19 1139  BP: (!) 163/85  Pulse: 77  Resp: 16  Temp: (!) 96.8 F (36 C)  SpO2: 97%    Physical Exam Vitals signs reviewed.  Constitutional:      Appearance: Normal appearance. She is not ill-appearing.  HENT:     Head: Normocephalic and atraumatic.  Neck:     Musculoskeletal: Normal range of motion and neck supple. No neck rigidity or muscular tenderness.     Vascular: No carotid bruit.     Comments: Small pea-sized right cervical lymphadenopathy just superior to the clavicle. Cardiovascular:     Rate and Rhythm: Normal rate and regular rhythm.     Heart sounds: Normal heart sounds. No murmur. No friction rub. No gallop.   Pulmonary:     Effort: Pulmonary effort is normal. No respiratory distress.      Breath sounds: Normal breath sounds. No stridor. No wheezing, rhonchi or rales.  Lymphadenopathy:     Cervical: Cervical adenopathy present.  Skin:    General: Skin is warm and dry.  Neurological:     Mental Status: She is alert and oriented to person, place, and time.    Dr. Tomie China notes reviewed.  PET scan report reviewed. Assessment  Right lung carcinoma, metastatic disease to the bone Plan   Patient is scheduled for a Port-A-Cath insertion on 06/12/2019.  The risks and benefits of the procedure including bleeding, infection, and pneumothorax were fully explained to the patient, who gave informed consent.

## 2019-06-09 NOTE — Telephone Encounter (Signed)
Notification or Prior Authorization is not required for the requested services  This [employer] members plan does not currently require a prior authorization for 84-50  Decision ID #:[id number]

## 2019-06-09 NOTE — Progress Notes (Signed)
Numeric Pain Rating Scale and Functional Assessment Average Pain 10   In the last MONTH (on 0-10 scale) has pain interfered with the following?  1. General activity like being  able to carry out your everyday physical activities such as walking, climbing stairs, carrying groceries, or moving a chair?  Rating(10)   +Driver, -BT, -Dye Allergies. 

## 2019-06-09 NOTE — Patient Instructions (Signed)
Lindsey Mullins  06/09/2019     @PREFPERIOPPHARMACY @   Your procedure is scheduled on 06/12/2019.  [facility] to [name]  Call this number if you have problems the morning of surgery:  [phone]   Remember:  Do not eat or drink after midnight.    Take these medicines the morning of surgery with A SIP OF WATER Coreg, Flexeril, Losartan, Claritin, Prilosec, and Ultram if needed    Do not wear jewelry, make-up or nail polish.  Do not wear lotions, powders, or perfumes, or deodorant.  Do not shave 48 hours prior to surgery.  Men may shave face and neck.  Do not bring valuables to the hospital.  [facility] is not responsible for any belongings or valuables.  Contacts, dentures or bridgework may not be worn into surgery.  Leave your suitcase in the car.  After surgery it may be brought to your room.  For patients admitted to the hospital, discharge time will be determined by your treatment team.  Patients discharged the day of surgery will not be allowed to drive home.   Name and phone number of your driver:   family Special instructions:  n/a  Please read over the following fact sheets that you were given. Care and Recovery After Surgery   PATIENT INSTRUCTIONS POST-ANESTHESIA  IMMEDIATELY FOLLOWING SURGERY:  Do not drive or operate machinery for the first twenty four hours after surgery.  Do not make any important decisions for twenty four hours after surgery or while taking narcotic pain medications or sedatives.  If you develop intractable nausea and vomiting or a severe headache please notify your doctor immediately.  FOLLOW-UP:  Please make an appointment with your surgeon as instructed. You do not need to follow up with anesthesia unless specifically instructed to do so.  WOUND CARE INSTRUCTIONS (if applicable):  Keep a dry clean dressing on the anesthesia/puncture wound site if there is drainage.  Once the wound has quit draining you may leave it open to  air.  Generally you should leave the bandage intact for twenty four hours unless there is drainage.  If the epidural site drains for more than 36-48 hours please call the anesthesia department.  QUESTIONS?:  Please feel free to call your physician or the hospital operator if you have any questions, and they will be happy to assist you.       Implanted Port Insertion Implanted port insertion is a procedure to put in a port and catheter. The port is a device with an injectable disk that can be accessed by your health care provider. The port is connected to a vein in the chest or neck by a small flexible tube (catheter). There are different types of ports. The implanted port may be used as a long-term IV access for:  Medicines, such as chemotherapy.  Fluids.  Liquid nutrition, such as total parenteral nutrition (TPN). When you have a port, this means that your health care provider will not need to use the veins in your arms for these procedures. Tell a health care provider about:  Any allergies you have.  All medicines you are taking, especially blood thinners, as well as any vitamins, herbs, eye drops, creams, over-the-counter medicines, and steroids.  Any problems you or family members have had with anesthetic medicines.  Any blood disorders you have.  Any surgeries you have had.  Any medical conditions you have or have had, including diabetes or kidney problems.  Whether you are  pregnant or may be pregnant. What are the risks? Generally, this is a safe procedure. However, problems may occur, including:  Allergic reactions to medicines or dyes.  Damage to other structures or organs.  Infection.  Damage to the blood vessel, bruising, or bleeding at the puncture site.  Blood clot.  Breakdown of the skin over the port.  A collection of air in the chest that can cause one of the lungs to collapse (pneumothorax). This is rare. What happens before the procedure? Medicines   Ask your health care provider about: ? Changing or stopping your regular medicines. This is especially important if you are taking diabetes medicines or blood thinners. ? Taking medicines such as aspirin and ibuprofen. These medicines can thin your blood. Do not take these medicines unless your health care provider tells you to take them. ? Taking over-the-counter medicines, vitamins, herbs, and supplements. Staying hydrated Follow instructions from your health care provider about hydration, which may include:  Up to 2 hours before the procedure - you may continue to drink clear liquids, such as water, clear fruit juice, black coffee, and plain tea.  Eating and drinking restrictions  Follow instructions from your health care provider about eating and drinking, which may include: ? 8 hours before the procedure - stop eating heavy meals or foods, such as meat, fried foods, or fatty foods. ? 6 hours before the procedure - stop eating light meals or foods, such as toast or cereal. ? 6 hours before the procedure - stop drinking milk or drinks that contain milk. ? 2 hours before the procedure - stop drinking clear liquids. General instructions  Plan to have someone take you home from the hospital or clinic.  If you will be going home right after the procedure, plan to have someone with you for 24 hours.  You may have blood tests.  Do not use any products that contain nicotine or tobacco for at least 4-6 weeks before the procedure. These products include cigarettes, e-cigarettes, and chewing tobacco. If you need help quitting, ask your health care provider.  Ask your health care provider what steps will be taken to help prevent infection. These may include: ? Removing hair at the surgery site. ? Washing skin with a germ-killing soap. ? Taking antibiotic medicine. What happens during the procedure?   An IV will be inserted into one of your veins.  You will be given one or more of the  following: ? A medicine to help you relax (sedative). ? A medicine to numb the area (local anesthetic).  Two small incisions will be made to insert the port. ? One smaller incision will be made in your neck to get access to the vein where the catheter will lie. ? The other incision will be made in the upper chest. This is where the port will lie.  The procedure may be done using continuous X-ray (fluoroscopy) or other imaging tools for guidance.  The port and catheter will be placed. There may be a small, raised area where the port is.  The port will be flushed with a salt solution (saline), and blood will be drawn to make sure that it is working correctly.  The incisions will be closed.  Bandages (dressings) may be placed over the incisions. The procedure may vary among health care providers and hospitals. What happens after the procedure?  Your blood pressure, heart rate, breathing rate, and blood oxygen level will be monitored until you leave the hospital or clinic.  Do not drive for 24 hours if you were given a sedative during your procedure.  You will be given a manufacturer's information card for the type of port that you have. Keep this with you.  Your port will need to be flushed and checked as told by your health care provider, usually every few weeks.  A chest X-ray will be done to: ? Check the placement of the port. ? Make sure there is no injury to your lung. Summary  Implanted port insertion is a procedure to put in a port and catheter.  The implanted port is used as a long-term IV access.  The port will need to be flushed and checked as told by your health care provider, usually every few weeks.  Keep your manufacturer's information card with you at all times. This information is not intended to replace advice given to you by your health care provider. Make sure you discuss any questions you have with your health care provider. Document Released: 08/26/2013  Document Revised: 02/27/2019 Document Reviewed: 06/03/2018 [employer]vier Patient Education  [place].

## 2019-06-09 NOTE — Patient Instructions (Addendum)
[facility] Chemotherapy Teaching    You have been diagnosed with metastatic (Stage IV) lung cancer.  You will be treated every three weeks with a combination of chemotherapy and immunotherapy drugs.  The medications you will receive to treat your cancer are:  paclitaxel (Taxol); carboplatin; and pembrolizumab (Keytruda).  The intent of this treatment is palliative, meaning it is meant to control your cancer (keep it from growing/spreading further), and to help alleviate any symptoms you may be having related to the cancer.  You will see the doctor regularly throughout treatment.  We monitor your lab work prior to every treatment. The doctor monitors your response to treatment by the way you are feeling, your blood work, and scans periodically.  There will be wait times while you are here for treatment.  It will take about 30 minutes to 1 hour for your lab work to result.  Then there will be wait times while pharmacy mixes your medications.     You will receive the following premedication prior to each treatment:   Medications you will receive in the clinic prior to your chemotherapy medications:  Aloxi:  ALOXI is used in adults to help prevent the nausea and vomiting that happens with certain anti-cancer medicines (chemotherapy).  Aloxi is a long acting medication, and will remain in your system for 24-36 hours.   Pepcid:  This medication is a histamine blocker that helps prevent and allergic reaction to your chemotherapy.   Dexamethasone:  This is a steroid given prior to chemotherapy to help prevent allergic reactions; it may also help prevent and control nausea and diarrhea.   Benadryl:  This is a histamine blocker (different from the Pepcid) that helps prevent allergic/infusion reactions to your chemotherapy. This medication may cause dizziness/drowsiness.    Carboplatin (Paraplatin, CBDCA)  About This Drug Carboplatin is used to treat cancer. It is given in the vein  (IV).  Possible Side Effects . Bone marrow suppression. This is a decrease in the number of white blood cells, red blood cells, and platelets. This may raise your risk of infection, make you tired and weak (fatigue), and raise your risk of bleeding. . Nausea and vomiting (throwing up) . Weakness . Changes in your liver function . Changes in your kidney function . Electrolyte changes . Pain  Note: Each of the side effects above was reported in 20% or greater of patients treated with carboplatin. Not all possible side effects are included above.  Warnings and Precautions . Severe bone marrow suppression . Allergic reactions, including anaphylaxis are rare but may happen in some patients. Signs of allergic reaction to this drug may be swelling of the face, feeling like your tongue or throat are swelling, trouble breathing, rash, itching, fever, chills, feeling dizzy, and/or feeling that your heart is beating in a fast or not normal way. If this happens, do not take another dose of this drug. You should get urgent medical treatment. . Severe nausea and vomiting . Effects on the nerves are called peripheral neuropathy. This risk is increased if you are over the age of 94 or if you have received other medicine with risk of peripheral neuropathy. You may feel numbness, tingling, or pain in your hands and feet. It may be hard for you to button your clothes, open jars, or walk as usual. The effect on the nerves may get worse with more doses of the drug. These effects get better in some people after the drug is stopped but it does  not get better in all people. [name] Blurred vision, loss of vision or other changes in eyesight . Decreased hearing . Skin and tissue irritation including redness, pain, warmth, or swelling at the IV site if the drug leaks out of the vein and into nearby tissue. . Severe changes in your kidney function, which can cause kidney failure . Severe changes in your liver  function, which can cause liver failure  Note: Some of the side effects above are very rare. If you have concerns and/or questions, please discuss them with your medical team.  Important Information . This drug may be present in the saliva, tears, sweat, urine, stool, vomit, semen, and vaginal secretions. Talk to your doctor and/or your nurse about the necessary precautions to take during this time.  Treating Side Effects . Manage tiredness by pacing your activities for the day. . Be sure to include periods of rest between energy-draining activities. . To decrease the risk of infection, wash your hands regularly. . Avoid close contact with people who have a cold, the flu, or other infections. . Take your temperature as your doctor or nurse tells you, and whenever you feel like you may have a fever. . To help decrease the risk of bleeding, use a soft toothbrush. Check with your nurse before using dental floss. . Be very careful when using knives or tools. . Use an electric shaver instead of a razor. . Drink plenty of fluids (a minimum of eight glasses per day is recommended). . If you throw up or have loose bowel movements, you should drink more fluids so that you do not become dehydrated (lack of water in the body from losing too much fluid). . To help with nausea and vomiting, eat small, frequent meals instead of three large meals a day. Choose foods and drinks that are at room temperature. Ask your nurse or doctor about other helpful tips and medicine that is available to help stop or lessen these symptoms. . If you have numbness and tingling in your hands and feet, be careful when cooking, walking, and handling sharp objects and hot liquids. [name] Keeping your pain under control is important to your well-being. Please tell your doctor or nurse if you are experiencing pain.  Food and Drug Interactions . There are no known interactions of carboplatin with food. . This drug may interact  with other medicines. Tell your doctor and pharmacist about all the prescription and over-the-counter medicines and dietary supplements (vitamins, minerals, herbs and others) that you are taking at this time. Also, check with your doctor or pharmacist before starting any new prescription or over-the-counter medicines, or dietary supplements to make sure that there are no interactions.  When to Call the Doctor Call your doctor or nurse if you have any of these symptoms and/or any new or unusual symptoms: . Fever of 100.4 F (38 C) or higher . Chills . Tiredness that interferes with your daily activities . Feeling dizzy or lightheaded . Easy bleeding or bruising . Nausea that stops you from eating or drinking and/or is not relieved by prescribed medicines . Throwing up more than 3 times a day . Blurred vision or other changes in eyesight . Decrease in hearing or ringing in the ear . Signs of allergic reaction: swelling of the face, feeling like your tongue or throat are swelling, trouble breathing, rash, itching, fever, chills, feeling dizzy, and/or feeling that your heart is beating in a fast or not normal way. If this happens, call 911  for emergency care. . While you are getting this drug, please tell your nurse right away if you have any pain, redness, or swelling at the site of the IV infusion . Signs of possible liver problems: dark urine, pale bowel movements, bad stomach pain, feeling very tired and weak, unusual itching, or yellowing of the eyes or skin . Decreased urine, or very dark urine . Numbness, tingling, or pain in your hands and feet . Pain that does not go away or is not relieved by prescribed medicine . If you think you may be pregnant  Reproduction Warnings . Pregnancy warning: This drug may have harmful effects on the unborn baby. Women of child bearing potential should use effective methods of birth control during your cancer treatment. Let your doctor know right  away if you think you may be pregnant. . Breastfeeding warning: It is not known if this drug passes into breast milk. For this reason, women should not breastfeed during treatment because this drug could enter the breast milk and cause harm to a breastfeeding baby. . Fertility warning: Human fertility studies have not been done with this drug. Talk with your doctor or nurse if you plan to have children. Ask for information on sperm or egg banking.  Paclitaxel (Taxol)  Paclitaxel is a drug used to treat cancer. It is given in the vein (IV).  This will take 1 hour to infuse.  The first time this drug is given it will take longer to infuse because it is increased slowly to monitor for reactions.  The nurse will be in the room with you for the first 15 minutes.   Possible Side Effects . Hair loss. Hair loss is often temporary, although with certain medicine, hair loss can sometimes be permanent. Hair loss may happen suddenly or gradually. If you lose hair, you may lose it from your head, face, armpits, pubic area, chest, and/or legs. You may also notice your hair getting thin. . Swelling of your legs, ankles and/or feet (edema) . Flushing . Nausea and throwing up (vomiting) . Loose bowel movements (diarrhea) . Bone marrow depression. This is a decrease in the number of white blood cells, red blood cells, and platelets. This may raise your risk of infection, make you tired and weak (fatigue), and raise your risk of bleeding. . Effects on the nerves are called peripheral neuropathy. You may feel numbness, tingling, or pain in your hands and feet. It may be hard for you to button your clothes, open jars, or walk as usual. The effect on the nerves may get worse with more doses of the drug. These effects get better in some people after the drug is stopped but it does not get better in all people. . Changes in your liver function . Bone, joint and muscle pain . Abnormal EKG . Allergic reaction: Allergic  reactions, including anaphylaxis are rare but may happen in some patients. Signs of allergic reaction to this drug may be swelling of the face, feeling like your tongue or throat are swelling, trouble breathing, rash, itching, fever, chills, feeling dizzy, and/or feeling that your heart is beating in a fast or not normal way. If this happens, do not take another dose of this drug. You should get urgent medical treatment. . Infection . Changes in your kidney function.  Note: Each of the side effects above was reported in 20% or greater of patients treated with paclitaxel. Not all possible side effects are included above.  Warnings and Precautions .  Severe bone marrow depression  Side Effects . To help with hair loss, wash with a mild shampoo and avoid washing your hair every day. . Avoid rubbing your scalp, instead, pat your hair or scalp dry . Avoid coloring your hair . Limit your use of hair spray, electric curlers, blow dryers, and curling irons. . If you are interested in getting a wig, talk to your nurse. You can also call the [facility] at [phone] to find out information about the "Look Good, Feel Better" program close to where you live. It is a free program where women getting chemotherapy can learn about wigs, turbans and scarves as well as makeup techniques and skin and nail care. . Ask your doctor or nurse about medicines that are available to help stop or lessen diarrhea and/or nausea. . To help with nausea and vomiting, eat small, frequent meals instead of three large meals a day. Choose foods and drinks that are at room temperature. Ask your nurse or doctor about other helpful tips and medicine that is available to help or stop lessen these symptoms. . If you get diarrhea, eat low-fiber foods that are high in protein and calories and avoid foods that can irritate your digestive tracts or lead to cramping. Ask your nurse or doctor about medicine that can lessen or stop  your diarrhea. . Mouth care is very important. Your mouth care should consist of routine, gentle cleaning of your teeth or dentures and rinsing your mouth with a mixture of 1/2 teaspoon of salt in 8 ounces of water or  teaspoon of baking soda in 8 ounces of water. This should be done at least after each meal and at bedtime. . If you have mouth sores, avoid mouthwash that has alcohol. Also avoid alcohol and smoking because they can bother your mouth and throat. . Drink plenty of fluids (a minimum of eight glasses per day is recommended). . Take your temperature as your doctor or nurse tells you, and whenever you feel like you may have a fever. . Talk to your doctor or nurse about precautions you can take to avoid infections and bleeding. . Be careful when cooking, walking, and handling sharp objects and hot liquids.  Food and Drug Interactions . There are no known interactions of paclitaxel with food. . This drug may interact with other medicines. Tell your doctor and pharmacist about all the medicines and dietary supplements (vitamins, minerals, herbs and others) that you are taking at this time. . The safety and use of dietary supplements and alternative diets are often not known. Using these might affect your cancer or interfere with your treatment. Until more is known, you should not use dietary supplements or alternative diets without your cancer doctor's help.  When to Call the Doctor Call your doctor or nurse if you have any of the following symptoms and/or any new or unusual symptoms: . Fever of 100.5 F (38 C) or above . Chills . Redness, pain, warmth, or swelling at the IV site during the infusion . Signs of allergic reaction: swelling of the face, feeling like your tongue or throat are swelling, trouble breathing, rash, itching, fever, chills, feeling dizzy, and/or feeling that your heart is beating in a fast or not normal way . Feeling that your heart is beating in a fast or not normal  way (palpitations) . Weight gain of 5 pounds in one week (fluid retention) . Decreased urine or very dark urine . Signs of liver problems: dark urine,  pale bowel movements, bad stomach pain, feeling very tired and weak, unusual itching, or yellowing of the eyes or skin . Heavy menstrual period that lasts longer than normal . Easy bruising or bleeding . Nausea that stops you from eating or drinking, and/or that is not relieved by prescribed medicines. . Loose bowel movements (diarrhea) more than 4 times a day or diarrhea with weakness or lightheadedness . Pain in your mouth or throat that makes it hard to eat or drink . Lasting loss of appetite or rapid weight loss of five pounds in a week . Signs of peripheral neuropathy: numbness, tingling, or decreased feeling in fingers or toes; trouble walking or changes in the way you walk; or feeling clumsy when buttoning clothes, opening jars, or other routine activities . Joint and muscle pain that is not relieved by prescribed medicines . Extreme fatigue that interferes with normal activities . While you are getting this drug, please tell your nurse right away if you have any pain, redness, or swelling at the site of the IV infusion. . If you think you are pregnant.  Reproduction Warnings . Pregnancy warning: This drug may have harmful effects on the unborn child, it is recommended that effective methods of birth control should be used during your cancer treatment. Let your doctor know right away if you think you may be pregnant. . Breast feeding warning: Women should not breast feed during treatment because this drug could enter the breast milk and cause harm to a breast feeding baby.  Pembrolizumab Beryle Flock)  About This Drug Pembrolizumab is used to treat cancer. It is given in the vein (IV).  Possible Side Effects . Nausea . Diarrhea (loose bowel movements) . Constipation (not able to move bowels) . Pain in your abdomen . Tiredness .  Fever . Decreased appetite (decreased hunger) . Muscle and bone pain . Trouble breathing . Cough . Rash . Itching  Note: Each of the side effects above was reported in 20% or greater of patients treated with pembrolizumab. Your side effects may be different if you are receiving pembrolizumab in combination with another chemotherapy agent. Not all possible side effects are included above.  Warnings and Precautions  . This drug works with your immune system and can cause inflammation in any of your organs and tissues and can change how they work. This may put you at risk for developing serious medical problems, which can be life-threatening. . Inflammation in the colon (colitis), which can be life-threatening - symptoms are loose bowel movements (diarrhea) stomach cramping, and sometimes blood in the bowel movements . Changes in liver function . Changes in kidney function . Inflammation (swelling) of the lungs, which can be life-threatening. You may have a dry cough or trouble breathing.  Neulasta - this is a bone marrow stimulant you will receive after chemotherapy to help your body produce neutrophils (a type of white blood cell).  These white blood cells help your body fight infections. You will receive this medication via injection under the skin in your belly at least 24 hours after receiving chemotherapy.    SELF CARE ACTIVITIES WHILE ON CHEMOTHERAPY:  Hydration Increase your fluid intake 48 hours prior to treatment and drink at least 8 to 12 cups (64 ounces) of water/decaffeinated beverages per day after treatment. You can still have your cup of coffee or soda but these beverages do not count as part of your 8 to 12 cups that you need to drink daily. No alcohol intake.  Medications Continue  taking your normal prescription medication as prescribed.  If you start any new herbal or new supplements please let us know first to make sure it is safe.  Mouth Care Have teeth cleaned  professionally before starting treatment. Keep dentures and partial plates clean. Use soft toothbrush and do not use mouthwashes that contain alcohol. Biotene is a good mouthwash that is available at most pharmacies or may be ordered by calling [phone]. Use warm salt water gargles (1 teaspoon salt per 1 quart warm water) before and after meals and at bedtime. Or you may rinse with 2 tablespoons of three-percent hydrogen peroxide mixed in eight ounces of water. If you are still having problems with your mouth or sores in your mouth please call the clinic. If you need dental work, please let the doctor know before you go for your appointment so that we can coordinate the best possible time for you in regards to your chemo regimen. You need to also let your dentist know that you are actively taking chemo. We may need to do labs prior to your dental appointment.  Skin Care Always use sunscreen that has not expired and with SPF (Sun Protection Factor) of 50 or higher. Wear hats to protect your head from the sun. Remember to use sunscreen on your hands, ears, face, & feet.  Use good moisturizing lotions such as udder cream, eucerin, or even Vaseline. Some chemotherapies can cause dry skin, color changes in your skin and nails.    . Avoid long, hot showers or baths. . Use gentle, fragrance-free soaps and laundry detergent. . Use moisturizers, preferably creams or ointments rather than lotions because the thicker consistency is better at preventing skin dehydration. Apply the cream or ointment within 15 minutes of showering. Reapply moisturizer at night, and moisturize your hands every time after you wash them.  Hair Loss (if your doctor says your hair will fall out)  . If your doctor says that your hair is likely to fall out, decide before you begin chemo whether you want to wear a wig. You may want to shop before treatment to match your hair color. . Hats, turbans, and scarves can also camouflage hair  loss, although some people prefer to leave their heads uncovered. If you go bare-headed outdoors, be sure to use sunscreen on your scalp. . Cut your hair short. It eases the inconvenience of shedding lots of hair, but it also can reduce the emotional impact of watching your hair fall out. . Don't perm or color your hair during chemotherapy. Those chemical treatments are already damaging to hair and can enhance hair loss. Once your chemo treatments are done and your hair has grown back, it's OK to resume dyeing or perming hair.  With chemotherapy, hair loss is almost always temporary. But when it grows back, it may be a different color or texture. In older adults who still had hair color before chemotherapy, the new growth may be completely gray.  Often, new hair is very fine and soft.  Infection Prevention Please wash your hands for at least 30 seconds using warm soapy water. Handwashing is the #1 way to prevent the spread of germs. Stay away from sick people or people who are getting over a cold. If you develop respiratory systems such as green/yellow mucus production or productive cough or persistent cough let us know and we will see if you need an antibiotic. It is a good idea to keep a pair of gloves on when going into  grocery stores/[employer] to decrease your risk of coming into contact with germs on the carts, etc. Carry alcohol hand gel with you at all times and use it frequently if out in public. If your temperature reaches 100.5 or higher please call the clinic and let us know.  If it is after hours or on the weekend please go to the ER if your temperature is over 100.5.  Please have your own personal thermometer at home to use.    Sex and bodily fluids If you are going to have sex, a condom must be used to protect the person that isn't taking chemotherapy. Chemo can decrease your libido (sex drive). For a few days after chemotherapy, chemotherapy can be excreted through your bodily fluids.  When  using the toilet please close the lid and flush the toilet twice.  Do this for a few day after you have had chemotherapy.   Effects of chemotherapy on your sex life Some changes are simple and won't last long. They won't affect your sex life permanently.  Sometimes you may feel: . too tired . not strong enough to be very active . sick or sore  . not in the mood . anxious or low Your anxiety might not seem related to sex. For example, you may be worried about the cancer and how your treatment is going. Or you may be worried about money, or about how you family are coping with your illness. These things can cause stress, which can affect your interest in sex. It's important to talk to your partner about how you feel. Remember - the changes to your sex life don't usually last long. There's usually no medical reason to stop having sex during chemo. The drugs won't have any long term physical effects on your performance or enjoyment of sex. Cancer can't be passed on to your partner during sex  Contraception It's important to use reliable contraception during treatment. Avoid getting pregnant while you or your partner are having chemotherapy. This is because the drugs may harm the baby. Sometimes chemotherapy drugs can leave a man or woman infertile.  This means you would not be able to have children in the future. You might want to talk to someone about permanent infertility. It can be very difficult to learn that you may no longer be able to have children. Some people find counselling helpful. There might be ways to preserve your fertility, although this is easier for men than for women. You may want to speak to a fertility expert. You can talk about sperm banking or harvesting your eggs. You can also ask about other fertility options, such as donor eggs. If you have or have had breast cancer, your doctor might advise you not to take the contraceptive pill. This is because the hormones in it might  affect the cancer.  It is not known for sure whether or not chemotherapy drugs can be passed on through semen or secretions from the vagina. Because of this some doctors advise people to use a barrier method if you have sex during treatment. This applies to vaginal, anal or oral sex. Generally, doctors advise a barrier method only for the time you are actually having the treatment and for about a week after your treatment. Advice like this can be worrying, but this does not mean that you have to avoid being intimate with your partner. You can still have close contact with your partner and continue to enjoy sex.  Animals If you have cats  or birds we just ask that you not change the litter or change the cage.  Please have someone else do this for you while you are on chemotherapy.   Food Safety During and After Cancer Treatment Food safety is important for people both during and after cancer treatment. Cancer and cancer treatments, such as chemotherapy, radiation therapy, and stem cell/bone marrow transplantation, often weaken the immune system. This makes it harder for your body to protect itself from foodborne illness, also called food poisoning. Foodborne illness is caused by eating food that contains harmful bacteria, parasites, or viruses.  Foods to avoid Some foods have a higher risk of becoming tainted with bacteria. These include: [name] Unwashed fresh fruit and vegetables, especially leafy vegetables that can hide dirt and other contaminants . Raw sprouts, such as alfalfa sprouts . Raw or undercooked beef, especially ground beef, or other raw or undercooked meat and poultry . Fatty, fried, or spicy foods immediately before or after treatment.  These can sit heavy on your stomach and make you feel nauseous. . Raw or undercooked shellfish, such as oysters. . Sushi and sashimi, which often contain raw fish.  . Unpasteurized beverages, such as unpasteurized fruit juices, raw milk, raw yogurt, or  cider . Undercooked eggs, such as soft boiled, over easy, and poached; raw, unpasteurized eggs; or foods made with raw egg, such as homemade raw cookie dough and homemade mayonnaise  Simple steps for food safety  Shop smart. . Do not buy food stored or displayed in an unclean area. . Do not buy bruised or damaged fruits or vegetables. . Do not buy cans that have cracks, dents, or bulges. . Pick up foods that can spoil at the end of your shopping trip and store them in a cooler on the way home.  Prepare and clean up foods carefully. . Rinse all fresh fruits and vegetables under running water, and dry them with a clean towel or paper towel. . Clean the top of cans before opening them. . After preparing food, wash your hands for 20 seconds with hot water and soap. Pay special attention to areas between fingers and under nails. . Clean your utensils and dishes with hot water and soap. [name] Disinfect your kitchen and cutting boards using 1 teaspoon of liquid, unscented bleach mixed into 1 quart of water.    Dispose of old food. . Eat canned and packaged food before its expiration date (the "use by" or "best before" date). . Consume refrigerated leftovers within 3 to 4 days. After that time, throw out the food. Even if the food does not smell or look spoiled, it still may be unsafe. Some bacteria, such as Listeria, can grow even on foods stored in the refrigerator if they are kept for too long.  Take precautions when eating out. . At restaurants, avoid buffets and salad bars where food sits out for a long time and comes in contact with many people. Food can become contaminated when someone with a virus, often a norovirus, or another "bug" handles it. . Put any leftover food in a "to-go" container yourself, rather than having the server do it. And, refrigerate leftovers as soon as you get home. . Choose restaurants that are clean and that are willing to prepare your food as you order it  cooked.   MEDICATIONS:  Compazine/Prochlorperazine 10mg  tablet. Take 1 tablet every 6 hours as needed for nausea/vomiting. (This can make you sleepy)   EMLA cream. Apply a quarter size amount to port site 1 hour prior to chemo. Do not rub in. Cover with plastic wrap.   Over-the-Counter Meds:  Colace - 100 mg capsules - take 2 capsules daily.  If this doesn't help then you can increase to 2 capsules twice daily.  Call us if this does not help your bowels move.   Imodium 2mg  capsule. Take 2 capsules after the 1st loose stool and then 1 capsule every 2 hours until you go a total of 12 hours without having a loose stool. Call the [facility] if loose stools continue. If diarrhea occurs at bedtime, take 2 capsules at bedtime. Then take 2 capsules every 4 hours until morning. Call [facility].    Diarrhea Sheet   If you are having loose stools/diarrhea, please purchase Imodium and begin taking as outlined:  At the first sign of poorly formed or loose stools you should begin taking Imodium (loperamide) 2 mg capsules.  Take two tablets (4mg ) followed by one tablet (2mg ) every 2 hours - DO NOT EXCEED 8 tablets in 24 hours.  If it is bedtime and you are having loose stools, take 2 tablets at bedtime, then 2 tablets every 4 hours until morning.   Always call the [facility] if you are having loose stools/diarrhea that you can't get under control.  Loose stools/diarrhea leads to dehydration (loss of water) in your body.  We have other options of trying to get the loose stools/diarrhea to stop but you must let us know!   Constipation Sheet  Colace - 100 mg capsules - take 2 capsules daily.  If this doesn't help then you can increase to 2 capsules twice daily.  Please call if the above does not work for you.   Do not go more than 2  days without a bowel movement.  It is very important that you do not become constipated.  It will make you feel sick to your stomach (nausea) and can cause abdominal pain and vomiting.   Nausea Sheet   Compazine/Prochlorperazine 10mg  tablet. Take 1 tablet every 6 hours as needed for nausea/vomiting. (This can make you sleepy)  If you are having persistent nausea (nausea that does not stop) please call the [facility] and let us know the amount of nausea that you are experiencing.  If you begin to vomit, you need to call the [facility] and if it is the weekend and you have vomited more than one time and can't get it to stop-go to the Emergency Room.  Persistent nausea/vomiting can lead to dehydration (loss of fluid in your body) and will make you feel terrible.   Ice chips, sips of clear liquids, foods that are @ room temperature, crackers, and toast tend to be better tolerated.   SYMPTOMS TO REPORT AS SOON AS POSSIBLE AFTER TREATMENT:   FEVER GREATER THAN 100.5 F  CHILLS WITH OR WITHOUT FEVER  NAUSEA AND VOMITING THAT IS NOT CONTROLLED WITH YOUR NAUSEA MEDICATION  UNUSUAL SHORTNESS OF BREATH  UNUSUAL BRUISING OR BLEEDING  TENDERNESS IN MOUTH AND THROAT WITH OR WITHOUT PRESENCE OF ULCERS  URINARY PROBLEMS  BOWEL PROBLEMS  UNUSUAL RASH      Wear comfortable clothing and clothing appropriate for easy access to any Portacath or PICC line. Let us know if there is anything that we can do to make your therapy  better!    What to do if you need assistance after hours or on the weekends: CALL [phone].  HOLD on the line, do not hang up.  You will hear multiple messages but at the end you will be connected with a nurse triage line.  They will contact the doctor if necessary.  Most of the time they will be able to assist you.  Do not call the hospital operator.      I have been informed and understand all of the instructions given to me and have received a copy. I have been  instructed to call the clinic [phone] or my family physician as soon as possible for continued medical care, if indicated. I do not have any more questions at this time but understand that I may call the [facility] or the Patient Navigator at [phone] during office hours should I have questions or need assistance in obtaining follow-up care.

## 2019-06-10 ENCOUNTER — Encounter (HOSPITAL_COMMUNITY)
Admission: RE | Admit: 2019-06-10 | Discharge: 2019-06-10 | Disposition: A | Discharge status: Home / Self Care | Payer: Medicare Other | Source: Ambulatory Visit | Attending: General Surgery | Admitting: General Surgery

## 2019-06-10 ENCOUNTER — Encounter (HOSPITAL_COMMUNITY): Payer: Self-pay

## 2019-06-10 ENCOUNTER — Other Ambulatory Visit (HOSPITAL_COMMUNITY)
Admission: RE | Admit: 2019-06-10 | Discharge: 2019-06-10 | Disposition: A | Discharge status: Home / Self Care | Payer: Medicare Other | Source: Ambulatory Visit | Attending: General Surgery | Admitting: General Surgery

## 2019-06-10 ENCOUNTER — Other Ambulatory Visit: Payer: Self-pay

## 2019-06-10 DIAGNOSIS — Z7189 Other specified counseling: Secondary | ICD-10-CM | POA: Insufficient documentation

## 2019-06-10 DIAGNOSIS — Z01812 Encounter for preprocedural laboratory examination: Secondary | ICD-10-CM | POA: Insufficient documentation

## 2019-06-10 DIAGNOSIS — I10 Essential (primary) hypertension: Secondary | ICD-10-CM | POA: Diagnosis not present

## 2019-06-10 DIAGNOSIS — Z1159 Encounter for screening for other viral diseases: Secondary | ICD-10-CM | POA: Diagnosis not present

## 2019-06-10 DIAGNOSIS — K219 Gastro-esophageal reflux disease without esophagitis: Secondary | ICD-10-CM | POA: Diagnosis not present

## 2019-06-10 DIAGNOSIS — Z8673 Personal history of transient ischemic attack (TIA), and cerebral infarction without residual deficits: Secondary | ICD-10-CM | POA: Diagnosis not present

## 2019-06-10 DIAGNOSIS — E785 Hyperlipidemia, unspecified: Secondary | ICD-10-CM | POA: Diagnosis not present

## 2019-06-10 DIAGNOSIS — C7951 Secondary malignant neoplasm of bone: Secondary | ICD-10-CM | POA: Diagnosis not present

## 2019-06-10 DIAGNOSIS — Z79899 Other long term (current) drug therapy: Secondary | ICD-10-CM | POA: Diagnosis not present

## 2019-06-10 DIAGNOSIS — Z7982 Long term (current) use of aspirin: Secondary | ICD-10-CM | POA: Diagnosis not present

## 2019-06-10 DIAGNOSIS — C3491 Malignant neoplasm of unspecified part of right bronchus or lung: Secondary | ICD-10-CM | POA: Diagnosis not present

## 2019-06-10 DIAGNOSIS — F17210 Nicotine dependence, cigarettes, uncomplicated: Secondary | ICD-10-CM | POA: Diagnosis not present

## 2019-06-10 LAB — BASIC METABOLIC PANEL
Anion gap: 10 (ref 5–15)
BUN: 14 mg/dL (ref 8–23)
CO2: 22 mmol/L (ref 22–32)
Calcium: 9.6 mg/dL (ref 8.9–10.3)
Chloride: 105 mmol/L (ref 98–111)
Creatinine, Ser: 0.7 mg/dL (ref 0.44–1.00)
GFR calc Af Amer: >60 mL/min (ref >60)
GFR calc non Af Amer: >60 mL/min (ref >60)
Glucose, Bld: 80 mg/dL (ref 70–99)
Potassium: 3.9 mmol/L (ref 3.5–5.1)
Sodium: 137 mmol/L (ref 135–145)

## 2019-06-10 LAB — SARS CORONAVIRUS 2 (TAT 6-24 HRS): SARS Coronavirus 2: NEGATIVE

## 2019-06-10 NOTE — Progress Notes (Signed)
START ON PATHWAY REGIMEN - Non-Small Cell Lung     A cycle is every 21 days:     Pembrolizumab      Paclitaxel      Carboplatin   **Always confirm dose/schedule in your pharmacy ordering system**  Patient Characteristics: Stage IV Metastatic, Squamous, PS = 0, 1, First Line, PD-L1 Expression Positive 1-49% (TPS) / Negative / Not Tested / Awaiting Test Results and Immunotherapy Candidate AJCC T Category: TX Current Disease Status: Distant Metastases AJCC N Category: NX AJCC M Category: M1c AJCC 8 Stage Grouping: IVB Histology: Squamous Cell Line of therapy: First Line ECOG Performance Status: 1 PD-L1 Expression Status: PD-L1 Positive 1-49% (TPS) Immunotherapy Candidate Status: Candidate for Immunotherapy Intent of Therapy: Non-Curative / Palliative Intent, Discussed with Patient

## 2019-06-11 ENCOUNTER — Inpatient Hospital Stay (HOSPITAL_COMMUNITY): Payer: Medicare Other

## 2019-06-11 DIAGNOSIS — C799 Secondary malignant neoplasm of unspecified site: Secondary | ICD-10-CM

## 2019-06-11 DIAGNOSIS — Z5112 Encounter for antineoplastic immunotherapy: Secondary | ICD-10-CM

## 2019-06-11 NOTE — Progress Notes (Signed)
Chemotherapy education packet given and discussed with pt and family in detail.  Discussed diagnosis and staging, and tx regimen.  Reviewed chemotherapy medications and side effects, as well as pre-medications.  Instructed on how to manage side effects at home, and when to call the clinic.  Importance of fever/chills discussed with pt and family. Discussed precautions to implement at home after receiving tx, as well as self care strategies. Phone numbers provided for clinic during regular working hours, also how to reach the clinic after hours and on weekends. Pt and family provided the opportunity to ask questions - all questions answered to pt's and family satisfaction.

## 2019-06-12 ENCOUNTER — Ambulatory Visit (HOSPITAL_COMMUNITY): Payer: Medicare Other

## 2019-06-12 ENCOUNTER — Encounter (HOSPITAL_COMMUNITY)
Admission: RE | Disposition: A | Discharge status: Home / Self Care | Payer: Self-pay | Source: Ambulatory Visit | Attending: General Surgery

## 2019-06-12 ENCOUNTER — Ambulatory Visit (HOSPITAL_COMMUNITY): Payer: Medicare Other | Admitting: Anesthesiology

## 2019-06-12 ENCOUNTER — Other Ambulatory Visit: Payer: Self-pay

## 2019-06-12 ENCOUNTER — Ambulatory Visit (HOSPITAL_COMMUNITY)
Admission: RE | Admit: 2019-06-12 | Discharge: 2019-06-12 | Disposition: A | Discharge status: Home / Self Care | Payer: Medicare Other | Source: Ambulatory Visit | Attending: General Surgery | Admitting: General Surgery

## 2019-06-12 DIAGNOSIS — C7951 Secondary malignant neoplasm of bone: Secondary | ICD-10-CM | POA: Diagnosis not present

## 2019-06-12 DIAGNOSIS — K219 Gastro-esophageal reflux disease without esophagitis: Secondary | ICD-10-CM | POA: Diagnosis not present

## 2019-06-12 DIAGNOSIS — Z7982 Long term (current) use of aspirin: Secondary | ICD-10-CM | POA: Insufficient documentation

## 2019-06-12 DIAGNOSIS — C3491 Malignant neoplasm of unspecified part of right bronchus or lung: Secondary | ICD-10-CM | POA: Insufficient documentation

## 2019-06-12 DIAGNOSIS — F17210 Nicotine dependence, cigarettes, uncomplicated: Secondary | ICD-10-CM | POA: Insufficient documentation

## 2019-06-12 DIAGNOSIS — Z95828 Presence of other vascular implants and grafts: Secondary | ICD-10-CM

## 2019-06-12 DIAGNOSIS — I10 Essential (primary) hypertension: Secondary | ICD-10-CM | POA: Insufficient documentation

## 2019-06-12 DIAGNOSIS — Z8673 Personal history of transient ischemic attack (TIA), and cerebral infarction without residual deficits: Secondary | ICD-10-CM | POA: Insufficient documentation

## 2019-06-12 DIAGNOSIS — E785 Hyperlipidemia, unspecified: Secondary | ICD-10-CM | POA: Insufficient documentation

## 2019-06-12 DIAGNOSIS — Z79899 Other long term (current) drug therapy: Secondary | ICD-10-CM | POA: Insufficient documentation

## 2019-06-12 DIAGNOSIS — Z1159 Encounter for screening for other viral diseases: Secondary | ICD-10-CM | POA: Insufficient documentation

## 2019-06-12 HISTORY — PX: PORTACATH PLACEMENT: SHX2246

## 2019-06-12 SURGERY — INSERTION, TUNNELED CENTRAL VENOUS DEVICE, WITH PORT
Anesthesia: General | Site: Chest | Laterality: Left

## 2019-06-12 MED ORDER — HEPARIN SOD (PORK) LOCK FLUSH 100 UNIT/ML IV SOLN
INTRAVENOUS | Status: DC | PRN
  Administered 2019-06-12: 500 [IU] via INTRAVENOUS

## 2019-06-12 MED ORDER — PROMETHAZINE HCL 25 MG/ML IJ SOLN: 6.2500 mg | INTRAMUSCULAR | Status: DC | PRN

## 2019-06-12 MED ORDER — KETAMINE HCL 50 MG/5ML IJ SOSY
PREFILLED_SYRINGE | INTRAMUSCULAR | Status: AC
  Filled 2019-06-12: qty 5

## 2019-06-12 MED ORDER — LIDOCAINE HCL (PF) 1 % IJ SOLN
INTRAMUSCULAR | Status: DC | PRN
  Administered 2019-06-12: 6 mL

## 2019-06-12 MED ORDER — LIDOCAINE HCL (PF) 1 % IJ SOLN
INTRAMUSCULAR | Status: AC
  Filled 2019-06-12: qty 30

## 2019-06-12 MED ORDER — CHLORHEXIDINE GLUCONATE CLOTH 2 % EX PADS: 6.0000 | MEDICATED_PAD | Freq: Once | CUTANEOUS | Status: DC

## 2019-06-12 MED ORDER — ONDANSETRON HCL 4 MG/2ML IJ SOLN
INTRAMUSCULAR | Status: DC | PRN
  Administered 2019-06-12: 4 mg via INTRAVENOUS

## 2019-06-12 MED ORDER — FENTANYL CITRATE (PF) 100 MCG/2ML IJ SOLN
INTRAMUSCULAR | Status: DC | PRN
  Administered 2019-06-12 (×2): 25 ug via INTRAVENOUS

## 2019-06-12 MED ORDER — SODIUM CHLORIDE (PF) 0.9 % IJ SOLN
INTRAMUSCULAR | Status: DC | PRN
  Administered 2019-06-12: 500 mL via INTRAVENOUS

## 2019-06-12 MED ORDER — PROPOFOL 500 MG/50ML IV EMUL
INTRAVENOUS | Status: DC | PRN
  Administered 2019-06-12: 75 ug/kg/min via INTRAVENOUS

## 2019-06-12 MED ORDER — LACTATED RINGERS IV SOLN
INTRAVENOUS | Status: DC
  Administered 2019-06-12: 08:00:00 via INTRAVENOUS

## 2019-06-12 MED ORDER — FENTANYL CITRATE (PF) 250 MCG/5ML IJ SOLN
INTRAMUSCULAR | Status: AC
  Filled 2019-06-12: qty 5

## 2019-06-12 MED ORDER — PROPOFOL 10 MG/ML IV BOLUS
INTRAVENOUS | Status: DC | PRN
  Administered 2019-06-12 (×3): 20 mg via INTRAVENOUS

## 2019-06-12 MED ORDER — VANCOMYCIN HCL IN DEXTROSE 1-5 GM/200ML-% IV SOLN
1000.0000 mg | INTRAVENOUS | Status: AC
  Administered 2019-06-12: 1000 mg via INTRAVENOUS
  Filled 2019-06-12: qty 200

## 2019-06-12 MED ORDER — KETOROLAC TROMETHAMINE 30 MG/ML IJ SOLN
15.0000 mg | Freq: Once | INTRAMUSCULAR | Status: AC
  Administered 2019-06-12: 15 mg via INTRAVENOUS
  Filled 2019-06-12: qty 1

## 2019-06-12 MED ORDER — MIDAZOLAM HCL 2 MG/2ML IJ SOLN: 0.5000 mg | Freq: Once | INTRAMUSCULAR | Status: DC | PRN

## 2019-06-12 MED ORDER — HEPARIN SOD (PORK) LOCK FLUSH 100 UNIT/ML IV SOLN
INTRAVENOUS | Status: AC
  Filled 2019-06-12: qty 5

## 2019-06-12 MED ORDER — PROPOFOL 10 MG/ML IV BOLUS
INTRAVENOUS | Status: AC
  Filled 2019-06-12: qty 20

## 2019-06-12 SURGICAL SUPPLY — 29 items
BAG DECANTER FOR FLEXI CONT (MISCELLANEOUS) ×3 IMPLANT
CHLORAPREP W/TINT 10.5 ML (MISCELLANEOUS) ×3 IMPLANT
CLOTH BEACON ORANGE TIMEOUT ST (SAFETY) ×3 IMPLANT
COVER LIGHT HANDLE STERIS (MISCELLANEOUS) ×6 IMPLANT
COVER WAND RF STERILE (DRAPES) ×3 IMPLANT
DECANTER SPIKE VIAL GLASS SM (MISCELLANEOUS) ×3 IMPLANT
DERMABOND ADVANCED (GAUZE/BANDAGES/DRESSINGS) ×2
DERMABOND ADVANCED .7 DNX12 (GAUZE/BANDAGES/DRESSINGS) ×1 IMPLANT
DRAPE C-ARM FOLDED MOBILE STRL (DRAPES) ×3 IMPLANT
ELECT REM PT RETURN 9FT ADLT (ELECTROSURGICAL) ×3
ELECTRODE REM PT RTRN 9FT ADLT (ELECTROSURGICAL) ×1 IMPLANT
GLOVE BIOGEL PI IND STRL 7.0 (GLOVE) ×2 IMPLANT
GLOVE BIOGEL PI INDICATOR 7.0 (GLOVE) ×4
GLOVE SURG SS PI 7.5 STRL IVOR (GLOVE) ×3 IMPLANT
GOWN STRL REUS W/TWL LRG LVL3 (GOWN DISPOSABLE) ×6 IMPLANT
IV NS 500ML (IV SOLUTION) ×2
IV NS 500ML BAXH (IV SOLUTION) ×1 IMPLANT
KIT PORT POWER 8FR ISP MRI (Port) ×3 IMPLANT
KIT TURNOVER KIT A (KITS) ×3 IMPLANT
NDL HYPO 25X1 1.5 SAFETY (NEEDLE) ×1 IMPLANT
NEEDLE HYPO 25X1 1.5 SAFETY (NEEDLE) ×3 IMPLANT
PACK MINOR (CUSTOM PROCEDURE TRAY) ×3 IMPLANT
PAD ARMBOARD 7.5X6 YLW CONV (MISCELLANEOUS) ×3 IMPLANT
SET BASIN LINEN APH (SET/KITS/TRAYS/PACK) ×3 IMPLANT
SUT MNCRL AB 4-0 PS2 18 (SUTURE) ×3 IMPLANT
SUT VIC AB 3-0 SH 27 (SUTURE) ×2
SUT VIC AB 3-0 SH 27X BRD (SUTURE) ×1 IMPLANT
SYR 5ML LL (SYRINGE) ×3 IMPLANT
SYR CONTROL 10ML LL (SYRINGE) ×3 IMPLANT

## 2019-06-12 NOTE — Anesthesia Postprocedure Evaluation (Signed)
Anesthesia Post Note  Patient: RIANN OMAN  Procedure(s) Performed: INSERTION PORT-A-CATH (catheter attached left subclavian) (N/A )  Patient location during evaluation: PACU Anesthesia Type: General Level of consciousness: awake and alert and patient cooperative Pain management: satisfactory to patient Vital Signs Assessment: post-procedure vital signs reviewed and stable Respiratory status: spontaneous breathing Cardiovascular status: stable Postop Assessment: no apparent nausea or vomiting Anesthetic complications: no     Last Vitals:  Vitals:   06/12/19 0750 06/12/19 0845  BP: (!) 169/83 (!) 153/80  Pulse: 65 73  Resp: 18 10  Temp: 36.6 C 36.5 C  SpO2: 98% 100%    Last Pain:  Vitals:   06/12/19 0845  TempSrc:   PainSc: 0-No pain                 Yazan Gatling

## 2019-06-12 NOTE — Transfer of Care (Addendum)
Immediate Anesthesia Transfer of Care Note  Patient: Lindsey Mullins  Procedure(s) Performed: INSERTION PORT-A-CATH (catheter attached left subclavian) (N/A )  Patient Location: PACU  Anesthesia Type:General  Level of Consciousness: awake, alert  and patient cooperative  Airway & Oxygen Therapy: Patient Spontanous Breathing  Post-op Assessment: Report given to RN and Post -op Vital signs reviewed and stable  Post vital signs: Reviewed and stable  Last Vitals:  Vitals Value Taken Time  BP 153/80 06/12/19 0845  Temp    Pulse 73 06/12/19 0846  Resp 10 06/12/19 0846  SpO2 100 % 06/12/19 0846  Vitals shown include unvalidated device data.  Last Pain:  Vitals:   06/12/19 0750  TempSrc: Oral  PainSc: 5       Patients Stated Pain Goal: 5 (33/61/22 [id number])  Complications: No apparent anesthesia complications

## 2019-06-12 NOTE — Op Note (Signed)
Patient:  Lindsey Mullins  DOB:  01-20-1946  MRN:  [id number]   Preop Diagnosis: Right lung carcinoma  Postop Diagnosis: Same  Procedure: Port-A-Cath insertion  Surgeon: Aviva Signs, MD  Anes: MAC  Indications: Patient is a 73 year old black female recently diagnosed with metastatic right lung carcinoma who presents for Port-A-Cath insertion.  The risks and benefits of the procedure including bleeding, infection, and pneumothorax were fully explained to the patient, who gave informed consent.  Procedure note: The patient was placed in the Trendelenburg position after the left upper chest was prepped and draped using the usual sterile technique with ChloraPrep.  Surgical site confirmation was performed.  1% Xylocaine was used for local anesthesia.  An incision was made below left clavicle.  A subcutaneous pocket was formed.  A needle was advanced into the left subclavian vein using the Seldinger technique without difficulty.  A guidewire was then advanced into the right atrium under fluoroscopic guidance.  An introducer and peel-away sheath were placed over the guidewire.  The catheter was inserted through the peel-away sheath and the peel-away sheath was removed.  The catheter was then attached to the port and the port placed in subcutaneous pocket.  Adequate positioning was confirmed by fluoroscopy.  Good backflow of venous blood was noted on aspiration of the port.  The port was flushed with heparin flush.  The subcutaneous layer was reapproximated using a 3-0 Vicryl interrupted suture.  The skin was closed using a 4-0 Monocryl subcuticular suture.  Dermabond was applied.  All tape and needle counts were correct at the end of the procedure.  The patient was awakened and transferred to PACU in stable condition.  A chest x-ray will be performed at that time.  Complications: None  EBL: Minimal  Specimen: None

## 2019-06-12 NOTE — Interval H&P Note (Signed)
History and Physical Interval Note:  06/12/2019 7:15 AM  Lindsey Mullins  has presented today for surgery, with the diagnosis of stage 4 lung cancer.  The various methods of treatment have been discussed with the patient and family. After consideration of risks, benefits and other options for treatment, the patient has consented to  Procedure(s): INSERTION PORT-A-CATH (N/A) as a surgical intervention.  The patient's history has been reviewed, patient examined, no change in status, stable for surgery.  I have reviewed the patient's chart and labs.  Questions were answered to the patient's satisfaction.     Aviva Signs

## 2019-06-12 NOTE — Discharge Instructions (Signed)
Implanted [facility] Guide An implanted port is a device that is placed under the skin. It is usually placed in the chest. The device can be used to give IV medicine, to take blood, or for dialysis. You may have an implanted port if:  You need IV medicine that would be irritating to the small veins in your hands or arms.  You need IV medicines, such as antibiotics, for a long period of time.  You need IV nutrition for a long period of time.  You need dialysis. Having a port means that your health care provider will not need to use the veins in your arms for these procedures. You may have fewer limitations when using a port than you would if you used other types of long-term IVs, and you will likely be able to return to normal activities after your incision heals. An implanted port has two main parts:  Reservoir. The reservoir is the part where a needle is inserted to give medicines or draw blood. The reservoir is round. After it is placed, it appears as a small, raised area under your skin.  Catheter. The catheter is a thin, flexible tube that connects the reservoir to a vein. Medicine that is inserted into the reservoir goes into the catheter and then into the vein. How is my port accessed? To access your port:  A numbing cream may be placed on the skin over the port site.  Your health care provider will put on a mask and sterile gloves.  The skin over your port will be cleaned carefully with a germ-killing soap and allowed to dry.  Your health care provider will gently pinch the port and insert a needle into it.  Your health care provider will check for a blood return to make sure the port is in the vein and is not clogged.  If your port needs to remain accessed to get medicine continuously (constant infusion), your health care provider will place a clear bandage (dressing) over the needle site. The dressing and needle will need to be changed every week, or as told by your health care  provider. What is flushing? Flushing helps keep the port from getting clogged. Follow instructions from your health care provider about how and when to flush the port. Ports are usually flushed with saline solution or a medicine called heparin. The need for flushing will depend on how the port is used:  If the port is only used from time to time to give medicines or draw blood, the port may need to be flushed: ? Before and after medicines have been given. ? Before and after blood has been drawn. ? As part of routine maintenance. Flushing may be recommended every 4-6 weeks.  If a constant infusion is running, the port may not need to be flushed.  Throw away any syringes in a disposal container that is meant for sharp items (sharps container). You can buy a sharps container from a pharmacy, or you can make one by using an empty hard plastic bottle with a cover. How long will my port stay implanted? The port can stay in for as long as your health care provider thinks it is needed. When it is time for the port to come out, a surgery will be done to remove it. The surgery will be similar to the procedure that was done to put the port in. Follow these instructions at home:   Flush your port as told by your health care provider.  If you need an infusion over several days, follow instructions from your health care provider about how to take care of your port site. Make sure you: ? Wash your hands with soap and water before you change your dressing. If soap and water are not available, use alcohol-based hand sanitizer. ? Change your dressing as told by your health care provider. ? Place any used dressings or infusion bags into a plastic bag. Throw that bag in the trash. ? Keep the dressing that covers the needle clean and dry. Do not get it wet. ? Do not use scissors or sharp objects near the tube. ? Keep the tube clamped, unless it is being used.  Check your port site every day for signs of  infection. Check for: ? Redness, swelling, or pain. ? Fluid or blood. ? Pus or a bad smell.  Protect the skin around the port site. ? Avoid wearing bra straps that rub or irritate the site. ? Protect the skin around your port from seat belts. Place a soft pad over your chest if needed.  Bathe or shower as told by your health care provider. The site may get wet as long as you are not actively receiving an infusion.  Return to your normal activities as told by your health care provider. Ask your health care provider what activities are safe for you.  Carry a medical alert card or wear a medical alert bracelet at all times. This will let health care providers know that you have an implanted port in case of an emergency. Get help right away if:  You have redness, swelling, or pain at the port site.  You have fluid or blood coming from your port site.  You have pus or a bad smell coming from the port site.  You have a fever. Summary  Implanted ports are usually placed in the chest for long-term IV access.  Follow instructions from your health care provider about flushing the port and changing bandages (dressings).  Take care of the area around your port by avoiding clothing that puts pressure on the area, and by watching for signs of infection.  Protect the skin around your port from seat belts. Place a soft pad over your chest if needed.  Get help right away if you have a fever or you have redness, swelling, pain, drainage, or a bad smell at the port site.     Monitored Anesthesia Care, Care After These instructions provide you with information about caring for yourself after your procedure. Your health care provider may also give you more specific instructions. Your treatment has been planned according to current medical practices, but problems sometimes occur. Call your health care provider if you have any problems or questions after your procedure. What can I expect after the  procedure? After your procedure, you may:  Feel sleepy for several hours.  Feel clumsy and have poor balance for several hours.  Feel forgetful about what happened after the procedure.  Have poor judgment for several hours.  Feel nauseous or vomit.  Have a sore throat if you had a breathing tube during the procedure. Follow these instructions at home: For at least 24 hours after the procedure:      Have a responsible adult stay with you. It is important to have someone help care for you until you are awake and alert.  Rest as needed.  Do not: ? Participate in activities in which you could fall or become injured. ? Drive. ?  Use heavy machinery. ? Drink alcohol. ? Take sleeping pills or medicines that cause drowsiness. ? Make important decisions or sign legal documents. ? Take care of children on your own. Eating and drinking  Follow the diet that is recommended by your health care provider.  If you vomit, drink water, juice, or soup when you can drink without vomiting.  Make sure you have little or no nausea before eating solid foods. General instructions  Take over-the-counter and prescription medicines only as told by your health care provider.  If you have sleep apnea, surgery and certain medicines can increase your risk for breathing problems. Follow instructions from your health care provider about wearing your sleep device: ? Anytime you are sleeping, including during daytime naps. ? While taking prescription pain medicines, sleeping medicines, or medicines that make you drowsy.  If you smoke, do not smoke without supervision.  Keep all follow-up visits as told by your health care provider. This is important. Contact a health care provider if:  You keep feeling nauseous or you keep vomiting.  You feel light-headed.  You develop a rash.  You have a fever. Get help right away if:  You have trouble breathing. Summary  For several hours after your  procedure, you may feel sleepy and have poor judgment.  Have a responsible adult stay with you for at least 24 hours or until you are awake and alert. This information is not intended to replace advice given to you by your health care provider. Make sure you discuss any questions you have with your health care provider. Document Released: 02/26/2016 Document Revised: 02/03/2018 Document Reviewed: 02/26/2016 Elsevier Patient Education  [place].  This information is not intended to replace advice given to you by your health care provider. Make sure you discuss any questions you have with your health care provider. Document Released: 11/05/2005 Document Revised: 02/27/2019 Document Reviewed: 12/08/2016 Elsevier Patient Education  2020 [employer].

## 2019-06-12 NOTE — Anesthesia Procedure Notes (Signed)
Procedure Name: MAC Date/Time: 06/12/2019 8:13 AM Performed by: Vista Deck, CRNA Pre-anesthesia Checklist: Patient identified, Emergency Drugs available, Suction available, Timeout performed and Patient being monitored Patient Re-evaluated:Patient Re-evaluated prior to induction Oxygen Delivery Method: Nasal Cannula

## 2019-06-12 NOTE — Anesthesia Preprocedure Evaluation (Signed)
Anesthesia Evaluation  Patient identified by MRN, date of birth, ID band Patient awake    Reviewed: Allergy & Precautions, NPO status , Patient's Chart, lab work & pertinent test results, reviewed documented beta blocker date and time   Airway Mallampati: I  TM Distance: >3 FB Neck ROM: Full    Dental no notable dental hx. (+) Edentulous Upper, Edentulous Lower   Pulmonary shortness of breath and with exertion, former smoker,    Pulmonary exam normal breath sounds clear to auscultation       Cardiovascular Exercise Tolerance: Good hypertension, negative cardio ROS Normal cardiovascular examI Rhythm:Regular Rate:Normal  States ET limited by LBP -status post recent ESI   Neuro/Psych PSYCHIATRIC DISORDERS TIA   GI/Hepatic Neg liver ROS, GERD  Medicated and Controlled,  Endo/Other  negative endocrine ROS  Renal/GU negative Renal ROS  negative genitourinary   Musculoskeletal  (+) Arthritis , Osteoarthritis,    Abdominal   Peds negative pediatric ROS (+)  Hematology negative hematology ROS (+)   Anesthesia Other Findings   Reproductive/Obstetrics negative OB ROS                             Anesthesia Physical Anesthesia Plan  ASA: III  Anesthesia Plan: General   Post-op Pain Management:    Induction: Intravenous  PONV Risk Score and Plan: 3 and Propofol infusion, TIVA, Ondansetron and Treatment may vary due to age or medical condition  Airway Management Planned: Nasal Cannula and Simple Face Mask  Additional Equipment:   Intra-op Plan:   Post-operative Plan:   Informed Consent: I have reviewed the patients History and Physical, chart, labs and discussed the procedure including the risks, benefits and alternatives for the proposed anesthesia with the patient or authorized representative who has indicated his/her understanding and acceptance.     Dental advisory given  Plan  Discussed with: CRNA  Anesthesia Plan Comments: (Plan Full PPE use  Plan MAC with GA/GETA as needed -WTP with same after Q&A)        Anesthesia Quick Evaluation

## 2019-06-15 ENCOUNTER — Inpatient Hospital Stay (HOSPITAL_COMMUNITY): Payer: Medicare Other

## 2019-06-15 ENCOUNTER — Encounter (HOSPITAL_COMMUNITY): Payer: Self-pay | Admitting: Hematology

## 2019-06-15 ENCOUNTER — Inpatient Hospital Stay (HOSPITAL_BASED_OUTPATIENT_CLINIC_OR_DEPARTMENT_OTHER): Payer: Medicare Other | Admitting: Hematology

## 2019-06-15 ENCOUNTER — Other Ambulatory Visit: Payer: Self-pay

## 2019-06-15 VITALS — BP 153/82 | HR 79 | Temp 96.8°F | Resp 18

## 2019-06-15 DIAGNOSIS — K30 Functional dyspepsia: Secondary | ICD-10-CM

## 2019-06-15 DIAGNOSIS — Z7982 Long term (current) use of aspirin: Secondary | ICD-10-CM

## 2019-06-15 DIAGNOSIS — Z5111 Encounter for antineoplastic chemotherapy: Secondary | ICD-10-CM | POA: Diagnosis not present

## 2019-06-15 DIAGNOSIS — C7889 Secondary malignant neoplasm of other digestive organs: Secondary | ICD-10-CM

## 2019-06-15 DIAGNOSIS — G2581 Restless legs syndrome: Secondary | ICD-10-CM

## 2019-06-15 DIAGNOSIS — Z5112 Encounter for antineoplastic immunotherapy: Secondary | ICD-10-CM

## 2019-06-15 DIAGNOSIS — E785 Hyperlipidemia, unspecified: Secondary | ICD-10-CM

## 2019-06-15 DIAGNOSIS — E876 Hypokalemia: Secondary | ICD-10-CM

## 2019-06-15 DIAGNOSIS — C3491 Malignant neoplasm of unspecified part of right bronchus or lung: Secondary | ICD-10-CM

## 2019-06-15 DIAGNOSIS — Z79899 Other long term (current) drug therapy: Secondary | ICD-10-CM

## 2019-06-15 DIAGNOSIS — M545 Low back pain: Secondary | ICD-10-CM

## 2019-06-15 DIAGNOSIS — Z87891 Personal history of nicotine dependence: Secondary | ICD-10-CM

## 2019-06-15 DIAGNOSIS — C7951 Secondary malignant neoplasm of bone: Secondary | ICD-10-CM

## 2019-06-15 DIAGNOSIS — K219 Gastro-esophageal reflux disease without esophagitis: Secondary | ICD-10-CM

## 2019-06-15 DIAGNOSIS — C801 Malignant (primary) neoplasm, unspecified: Secondary | ICD-10-CM | POA: Diagnosis not present

## 2019-06-15 DIAGNOSIS — C799 Secondary malignant neoplasm of unspecified site: Secondary | ICD-10-CM

## 2019-06-15 DIAGNOSIS — I10 Essential (primary) hypertension: Secondary | ICD-10-CM

## 2019-06-15 DIAGNOSIS — M8580 Other specified disorders of bone density and structure, unspecified site: Secondary | ICD-10-CM

## 2019-06-15 DIAGNOSIS — Z8673 Personal history of transient ischemic attack (TIA), and cerebral infarction without residual deficits: Secondary | ICD-10-CM

## 2019-06-15 DIAGNOSIS — K5900 Constipation, unspecified: Secondary | ICD-10-CM

## 2019-06-15 LAB — COMPREHENSIVE METABOLIC PANEL
ALT: 24 U/L (ref 0–44)
AST: 36 U/L (ref 15–41)
Albumin: 3.8 g/dL (ref 3.5–5.0)
Alkaline Phosphatase: 99 U/L (ref 38–126)
Anion gap: 9 (ref 5–15)
BUN: 17 mg/dL (ref 8–23)
CO2: 24 mmol/L (ref 22–32)
Calcium: 9 mg/dL (ref 8.9–10.3)
Chloride: 103 mmol/L (ref 98–111)
Creatinine, Ser: 0.7 mg/dL (ref 0.44–1.00)
GFR calc Af Amer: >60 mL/min (ref >60)
GFR calc non Af Amer: >60 mL/min (ref >60)
Glucose, Bld: 102 mg/dL — ABNORMAL HIGH (ref 70–99)
Potassium: 3.3 mmol/L — ABNORMAL LOW (ref 3.5–5.1)
Sodium: 136 mmol/L (ref 135–145)
Total Bilirubin: 0.5 mg/dL (ref 0.3–1.2)
Total Protein: 7.6 g/dL (ref 6.5–8.1)

## 2019-06-15 LAB — CBC WITH DIFFERENTIAL/PLATELET
Abs Immature Granulocytes: 0.03 10*3/uL (ref 0.00–0.07)
Basophils Absolute: 0 10*3/uL (ref 0.0–0.1)
Basophils Relative: 0 %
Eosinophils Absolute: 0.1 10*3/uL (ref 0.0–0.5)
Eosinophils Relative: 1 %
HCT: 34.9 % — ABNORMAL LOW (ref 36.0–46.0)
Hemoglobin: 11.1 g/dL — ABNORMAL LOW (ref 12.0–15.0)
Immature Granulocytes: 0 %
Lymphocytes Relative: 22 %
Lymphs Abs: 2.1 10*3/uL (ref 0.7–4.0)
MCH: 28.6 pg (ref 26.0–34.0)
MCHC: 31.8 g/dL (ref 30.0–36.0)
MCV: 89.9 fL (ref 80.0–100.0)
Monocytes Absolute: 1 10*3/uL (ref 0.1–1.0)
Monocytes Relative: 11 %
Neutro Abs: 6.4 10*3/uL (ref 1.7–7.7)
Neutrophils Relative %: 66 %
Platelets: 331 10*3/uL (ref 150–400)
RBC: 3.88 MIL/uL (ref 3.87–5.11)
RDW: 14.8 % (ref 11.5–15.5)
WBC: 9.7 10*3/uL (ref 4.0–10.5)
nRBC: 0 % (ref 0.0–0.2)

## 2019-06-15 LAB — TSH: TSH: 2.047 u[IU]/mL (ref 0.350–4.500)

## 2019-06-15 MED ORDER — NAPROXEN SODIUM 275 MG PO TABS
275.0000 mg | ORAL_TABLET | Freq: Once | ORAL | Status: DC
  Filled 2019-06-15: qty 1

## 2019-06-15 MED ORDER — SODIUM CHLORIDE 0.9 % IV SOLN
525.6000 mg | Freq: Once | INTRAVENOUS | Status: AC
  Administered 2019-06-15: 530 mg via INTRAVENOUS
  Filled 2019-06-15: qty 53

## 2019-06-15 MED ORDER — SODIUM CHLORIDE 0.9% FLUSH
10.0000 mL | INTRAVENOUS | Status: DC | PRN
  Administered 2019-06-15: 08:00:00 10 mL
  Filled 2019-06-15: qty 10

## 2019-06-15 MED ORDER — SODIUM CHLORIDE 0.9 % IV SOLN
200.0000 mg | Freq: Once | INTRAVENOUS | Status: AC
  Administered 2019-06-15: 10:00:00 200 mg via INTRAVENOUS
  Filled 2019-06-15: qty 8

## 2019-06-15 MED ORDER — SODIUM CHLORIDE 0.9 % IV SOLN
Freq: Once | INTRAVENOUS | Status: AC
  Administered 2019-06-15: 10:00:00 via INTRAVENOUS
  Filled 2019-06-15: qty 5

## 2019-06-15 MED ORDER — NAPROXEN 250 MG PO TABS
250.0000 mg | ORAL_TABLET | Freq: Once | ORAL | Status: AC
  Administered 2019-06-15: 250 mg via ORAL
  Filled 2019-06-15: qty 1

## 2019-06-15 MED ORDER — PALONOSETRON HCL INJECTION 0.25 MG/5ML
0.2500 mg | Freq: Once | INTRAVENOUS | Status: AC
  Administered 2019-06-15: 0.25 mg via INTRAVENOUS
  Filled 2019-06-15: qty 5

## 2019-06-15 MED ORDER — POTASSIUM CHLORIDE CRYS ER 20 MEQ PO TBCR
20.0000 meq | EXTENDED_RELEASE_TABLET | Freq: Once | ORAL | Status: AC
  Administered 2019-06-15: 20 meq via ORAL
  Filled 2019-06-15: qty 1

## 2019-06-15 MED ORDER — HEPARIN SOD (PORK) LOCK FLUSH 100 UNIT/ML IV SOLN
500.0000 [IU] | Freq: Once | INTRAVENOUS | Status: AC | PRN
  Administered 2019-06-15: 500 [IU]

## 2019-06-15 MED ORDER — ALUM & MAG HYDROXIDE-SIMETH 200-200-20 MG/5ML PO SUSP
30.0000 mL | Freq: Once | ORAL | Status: AC
  Administered 2019-06-15: 30 mL via ORAL
  Filled 2019-06-15: qty 30

## 2019-06-15 MED ORDER — FAMOTIDINE IN NACL 20-0.9 MG/50ML-% IV SOLN
20.0000 mg | Freq: Once | INTRAVENOUS | Status: AC
  Administered 2019-06-15: 09:00:00 20 mg via INTRAVENOUS
  Filled 2019-06-15: qty 50

## 2019-06-15 MED ORDER — SODIUM CHLORIDE 0.9 % IV SOLN
200.0000 mg/m2 | Freq: Once | INTRAVENOUS | Status: AC
  Administered 2019-06-15: 11:00:00 384 mg via INTRAVENOUS
  Filled 2019-06-15: qty 64

## 2019-06-15 MED ORDER — DIPHENHYDRAMINE HCL 50 MG/ML IJ SOLN
50.0000 mg | Freq: Once | INTRAMUSCULAR | Status: AC
  Administered 2019-06-15: 50 mg via INTRAVENOUS
  Filled 2019-06-15: qty 1

## 2019-06-15 MED ORDER — SODIUM CHLORIDE 0.9 % IV SOLN
Freq: Once | INTRAVENOUS | Status: AC
  Administered 2019-06-15: 09:00:00 via INTRAVENOUS

## 2019-06-15 NOTE — Progress Notes (Signed)
06/15/19  Confirmed Taxol dose of 200 mg/m2 with MD.  T.O. Dr Beckey Downing LPN/Anayia Eugene Ronnald Ramp, PharmD

## 2019-06-15 NOTE — Progress Notes (Signed)
To treatment room for labs and oncology follow up visit.  Patient complaints of back pain and took an aleve this morning.  Rated 7 on pain scale.  Also, complaints of bad "indigestion".  Dr. Delton Coombes notified.    Potassium 3.3 today.  Maalox 18ml with Potassium 67meq tab by mouth today verbal order Dr. Delton Coombes.  Dr. Delton Coombes aware of patients blood pressure with verbal order Ok to treat today per Dr. Delton Coombes.    Reviewed with the patient chemo teaching with all questions asked and answered.    Patient tolerated chemotherapy with no complaints voiced.  Port site clean and dry with no bruising or swelling noted at site.  Good blood return noted before and after administration of chemotherapy.  Band aid applied.  Patient left by wheelchair with VSS and no s/s of distress noted.

## 2019-06-15 NOTE — Patient Instructions (Signed)
[facility] at [facility] Discharge Instructions  You were seen today by Dr. Katragadda. He went over your recent lab results. He will see you back in 1 week for labs and follow up.   Thank you for choosing [facility] at [facility] to provide your oncology and hematology care.  To afford each patient quality time with our provider, please arrive at least 15 minutes before your scheduled appointment time.   If you have a lab appointment with the Cancer Center please come in thru the  Main Entrance and check in at the main information desk  You need to re-schedule your appointment should you arrive 10 or more minutes late.  We strive to give you quality time with our providers, and arriving late affects you and other patients whose appointments are after yours.  Also, if you no show three or more times for appointments you may be dismissed from the clinic at the providers discretion.     Again, thank you for choosing [facility].  Our hope is that these requests will decrease the amount of time that you wait before being seen by our physicians.       _____________________________________________________________  Should you have questions after your visit to [facility], please contact our office at [phone] between the hours of 8:00 a.m. and 4:30 p.m.  Voicemails left after 4:00 p.m. will not be returned until the following business day.  For prescription refill requests, have your pharmacy contact our office and allow 72 hours.    Cancer Center Support Programs:   > Cancer Support Group  2nd Tuesday of the month 1pm-2pm, Journey Room    

## 2019-06-15 NOTE — Assessment & Plan Note (Addendum)
1.  Metastatic poorly differentiated squamous cell carcinoma of the lung: -PDL 1 TPS-10%.  Foundation 1 test results pending. -15-pack-year smoker, quit smoking 25 years ago.  10 to 12 pound weight loss in the last 3 to 6 months, trying to lose weight.  Presentation with low back pain with MRI of the lumbar spine showing numerous bone lesions involving lumbar spine and right iliac bone. - PET scan on 05/08/2019 shows intensely hypermetabolic right hilar, mediastinal, right supraclavicular adenopathy with solitary small hypermetabolic nodule in the right lung measuring 1 cm.  Focal activity in the right posterior oropharynx with no CT correlate.  Widespread hypermetabolic skeletal metastasis and solitary splenic metastasis.  MRI of the brain negative on 06/05/2019 for metastatic disease. - Right supraclavicular lymph node biopsy on 05/20/2019 shows metastatic poorly differentiated squamous cell carcinoma, positive for pankeratin, P 16, CK 7, and CK 5/6. - Based on her PDL 1 results, I have recommended combination chemoimmunotherapy with carboplatin, paclitaxel and pembrolizumab.  We discussed the side effects in detail.  I also called and talked to both her daughters. - I have reviewed her blood work today.  Apart from mild hypokalemia with potassium of 3.3, other labs were normal. -We will proceed with her first cycle today.  I will reevaluate her in 1 week.  2.  Low back pain: - She cannot take any narcotics including tramadol as they make her drowsy.  Lidocaine patch did not help. - She was taking Aleve.  She did get injection in the back on 06/09/2019.  This helped only minimally.  She is scheduled to see the back doctor again next week.  3.  Hypertension: -Blood pressure today is 94/80. -She is taking losartan 50 mg half tablet daily.  She takes one fourth of a tablet twice daily.  I have told her to take one fourth of a tablet daily.  She is also on carvedilol 3.125 mg daily.

## 2019-06-15 NOTE — Progress Notes (Signed)
[place]ins, [place] [address]   CLINIC:  Medical Oncology/Hematology  PCP:  [name], MD [address] [place] [address] [phone]   REASON FOR VISIT: Metastatic squamous cell lung cancer.   INTERVAL HISTORY:  Lindsey Mullins 73 y.o. female seen for follow-up of metastatic squamous cell lung cancer and initiation of chemotherapy.  Appetite is 75%.  Energy levels are 75%.  Pain in the back is rated as 8 out of 10.  She complained of some indigestion.  She also had constipation with bowel movements twice a week which is her baseline.  She had a injection to the back done on 06/09/2019.  She has noticed very minimal improvement in pain.  Denies any fevers, night sweats or weight loss.  Denies any tingling or numbness in extremities.  Denies any lightheadedness or palpitations.  No chest pains reported.   REVIEW OF SYSTEMS:  Review of Systems  Gastrointestinal: Positive for constipation.  All other systems reviewed and are negative.    PAST MEDICAL/SURGICAL HISTORY:  Past Medical History:  Diagnosis Date  . Atypical chest pain 05/23/2012   STRESS TEST - small to moderate sized area of partial reversibility of the anteroapical wall, most likely breast artifact, post-stress EF 67%, EKG show NSR at 65, No Lexiscan EKG changes, non-diagnostic for ischemia; STRESS TEST, 01/25/2010 - normal study, post-stress EF 66%, no significant ischemia  . Cerebral atherosclerosis 09/29/2012   CAROTID DUPLEX - RIGHT  BULB/PROXIMAL ICA-moderate amount of fibrous plaque, 50-69% diameter reduction; LEFT CEA-normal, no significant diameter reduction  . Colitis   . GERD (gastroesophageal reflux disease)   . Hyperlipidemia   . Hypertension   . Osteopenia   . Restless leg syndrome   . Shortness of breath 03/30/2005   2D ECHO - EF >55%, normal  . TIA (transient ischemic attack) 01/27/2010   2D ECHO - EF 65%, normal   Past Surgical History:   Procedure Laterality Date  . ABDOMINAL EXPLORATION SURGERY    . CAROTID ENDARTERECTOMY    . LYMPH NODE BIOPSY N/A 05/20/2019   Procedure: LYMPH NODE BIOPSY CERVICAL;  Surgeon: Aviva Signs, MD;  Location: [facility];  Service: General;  Laterality: N/A;  . Peripheral Vascular Catheterization  07/16/2005   Right verterbral-50% smooth segmental badsilar artery stenosis on right side, Right carotid-50% ulcerated proxmial stenosis, Left carotid-60 to 70% left externam carotid artery stenosis, 90% proximal left ICA stenosis  . PORTACATH PLACEMENT Left 06/12/2019   Procedure: INSERTION PORT-A-CATH (catheter attached left subclavian);  Surgeon: Aviva Signs, MD;  Location: [facility];  Service: General;  Laterality: Left;     SOCIAL HISTORY:  Social History   Socioeconomic History  . Marital status: Divorced    Spouse name: Not on file  . Number of children: 2  . Years of education: Not on file  . Highest education level: Not on file  Occupational History  . Not on file  Social Needs  . Financial resource strain: Not hard at all  . Food insecurity    Worry: Never true    Inability: Never true  . Transportation needs    Medical: No    Non-medical: No  Tobacco Use  . Smoking status: Former Smoker    Packs/day: 0.25    Years: 10.00    Pack years: 2.50    Types: Cigarettes    Quit date: 04/21/1983    Years since quitting: 36.1  . Smokeless tobacco: Never  Used  Substance and Sexual Activity  . Alcohol use: No  . Drug use: No  . Sexual activity: Not Currently  Lifestyle  . Physical activity    Days per week: 0 days    Minutes per session: 0 min  . Stress: Not at all  Relationships  . Social Herbalist on phone: Twice a week    Gets together: Twice a week    Attends religious service: More than 4 times per year    Active member of club or organization: No    Attends meetings of clubs or organizations: Never    Relationship status: Divorced  . Intimate partner violence     Fear of current or ex partner: No    Emotionally abused: No    Physically abused: No    Forced sexual activity: No  Other Topics Concern  . Not on file  Social History Narrative  . Not on file    FAMILY HISTORY:  Family History  Problem Relation Age of Onset  . Heart disease Mother   . Hypertension Mother   . Hypertension Father   . Heart disease Father     CURRENT MEDICATIONS:  Outpatient Encounter Medications as of 06/15/2019  Medication Sig  . acetaminophen (TYLENOL) 325 MG tablet Take 325-650 mg by mouth every 6 (six) hours as needed (for headaches or pain).  [name] aspirin 81 MG tablet Take 81 mg by mouth daily.   [name] CARBOPLATIN IV Inject into the vein every 21 ( twenty-one) days.  . carvedilol (COREG) 3.125 MG tablet TAKE 1 TABLET BY MOUTH ONCE DAILY (Patient taking differently: Take 3.125 mg by mouth daily. )  . cyclobenzaprine (FLEXERIL) 5 MG tablet TAKE 1 TABLET BY MOUTH AT BEDTIME AS NEEDED FOR MUSCLE SPASM OR  LEG  CRAMPS (Patient taking differently: Take 5 mg by mouth at bedtime. TAKE 1 TABLET BY MOUTH AT BEDTIME AS NEEDED FOR MUSCLE SPASM OR  LEG  CRAMPS)  . lidocaine (LIDODERM) 5 % Place 1 patch onto the skin daily. Remove & Discard patch within 12 hours or as directed by MD  . lidocaine-prilocaine (EMLA) cream Apply 1 application topically as needed. Apply small amount to port a cath site and cover with plastic wrap one hour prior to chemotherapy appointments  . loratadine (CLARITIN) 10 MG tablet Take 10 mg by mouth daily.  [name] losartan (COZAAR) 50 MG tablet TAKE 1/2 (ONE-HALF) TABLET BY MOUTH ONCE DAILY (Patient taking differently: Take 25 mg by mouth daily. )  . naproxen sodium (ALEVE) 220 MG tablet Take 220 mg by mouth 2 (two) times daily as needed.  . nitroGLYCERIN (NITROSTAT) 0.4 MG SL tablet DISSOLVE ONE TABLET UNDER THE TONGUE AS NEEDED FOR CHEST PAIN**MAX 3 TABLETS EACH 5 MINUTES APART** (Patient taking differently: Place 0.4 mg under the tongue every 5 (five) minutes as  needed for chest pain. )  . Omega-3 Krill Oil 500 MG CAPS Take 500 mg by mouth daily.   [name] omeprazole (PRILOSEC) 20 MG capsule Take 1 po daily (Patient taking differently: Take 20 mg by mouth daily as needed (acid reflux). )  . PACLITAXEL IV Inject into the vein every 21 ( twenty-one) days.  [name] PEMBROLIZUMAB IV Inject into the vein every 21 ( twenty-one) days.  . prochlorperazine (COMPAZINE) 10 MG tablet Take 1 tablet (10 mg total) by mouth every 6 (six) hours as needed for nausea or vomiting.  . rosuvastatin (CRESTOR) 20 MG tablet Take 1 tablet (20 mg total) by  mouth daily. (Patient taking differently: Take 10 mg by mouth 2 (two) times a day. )  . tiZANidine (ZANAFLEX) 4 MG tablet Take 4 mg by mouth.   . traMADol (ULTRAM) 50 MG tablet Take 1 tablet (50 mg total) by mouth every 6 (six) hours as needed.   No facility-administered encounter medications on file as of 06/15/2019.     ALLERGIES:  Allergies  Allergen Reactions  . Penicillins Shortness Of Breath and Other (See Comments)    Has patient had a PCN reaction causing immediate rash, facial/tongue/throat swelling, SOB or lightheadedness with hypotension: Yes Has patient had a PCN reaction causing severe rash involving mucus membranes or skin necrosis: Unk Has patient had a PCN reaction that required hospitalization: Unk Has patient had a PCN reaction occurring within the last 10 years: Yes "States that she was in ICU after being administered"   . Codeine Nausea And Vomiting  . Lipitor [Atorvastatin] Other (See Comments)    Myalgias  . Lisinopril Cough  . Pravastatin Other (See Comments)    Myalgias     PHYSICAL EXAM:  ECOG Performance status: 1  Vitals:   06/15/19 0757  BP: 94/80  Pulse: 82  Resp: 18  Temp: (!) 96.9 F (36.1 C)  SpO2: 100%   Filed Weights   06/15/19 0757  Weight: 170 lb 8 oz (77.3 kg)    Physical Exam Constitutional:      Appearance: Normal appearance. She is normal weight.  Cardiovascular:      Rate and Rhythm: Normal rate and regular rhythm.     Heart sounds: Normal heart sounds.  Pulmonary:     Effort: Pulmonary effort is normal.     Breath sounds: Normal breath sounds.  Abdominal:     General: Bowel sounds are normal.     Palpations: Abdomen is soft.  Musculoskeletal: Normal range of motion.  Skin:    General: Skin is warm and dry.  Neurological:     Mental Status: She is alert and oriented to person, place, and time. Mental status is at baseline.  Psychiatric:        Mood and Affect: Mood normal.        Behavior: Behavior normal.        Thought Content: Thought content normal.        Judgment: Judgment normal.      LABORATORY DATA:  I have reviewed the labs as listed.  CBC    Component Value Date/Time   WBC 9.7 06/15/2019 0754   RBC 3.88 06/15/2019 0754   HGB 11.1 (L) 06/15/2019 0754   HCT 34.9 (L) 06/15/2019 0754   PLT 331 06/15/2019 0754   MCV 89.9 06/15/2019 0754   MCH 28.6 06/15/2019 0754   MCHC 31.8 06/15/2019 0754   RDW 14.8 06/15/2019 0754   LYMPHSABS 2.1 06/15/2019 0754   MONOABS 1.0 06/15/2019 0754   EOSABS 0.1 06/15/2019 0754   BASOSABS 0.0 06/15/2019 0754   CMP Latest Ref Rng & Units 06/15/2019 06/10/2019 04/29/2019  Glucose 70 - 99 mg/dL 102(H) 80 91  BUN 8 - 23 mg/dL 17 14 9   Creatinine 0.44 - 1.00 mg/dL 0.70 0.70 0.74  Sodium 135 - 145 mmol/L 136 137 141  Potassium 3.5 - 5.1 mmol/L 3.3(L) 3.9 3.7  Chloride 98 - 111 mmol/L 103 105 105  CO2 22 - 32 mmol/L 24 22 23   Calcium 8.9 - 10.3 mg/dL 9.0 9.6 9.3  Total Protein 6.5 - 8.1 g/dL 7.6 - 8.0  Total Bilirubin 0.3 -  1.2 mg/dL 0.5 - 0.5  Alkaline Phos 38 - 126 U/L 99 - 96  AST 15 - 41 U/L 36 - 31  ALT 0 - 44 U/L 24 - 18       DIAGNOSTIC IMAGING:  I have independently reviewed the scans and discussed with the patient.   ASSESSMENT & PLAN:   Squamous cell carcinoma of lung, stage IV, right ([place]) 1.  Metastatic poorly differentiated squamous cell carcinoma of the lung: -PDL 1  TPS-10%.  Foundation 1 test results pending. -15-pack-year smoker, quit smoking 25 years ago.  10 to 12 pound weight loss in the last 3 to 6 months, trying to lose weight.  Presentation with low back pain with MRI of the lumbar spine showing numerous bone lesions involving lumbar spine and right iliac bone. - PET scan on 05/08/2019 shows intensely hypermetabolic right hilar, mediastinal, right supraclavicular adenopathy with solitary small hypermetabolic nodule in the right lung measuring 1 cm.  Focal activity in the right posterior oropharynx with no CT correlate.  Widespread hypermetabolic skeletal metastasis and solitary splenic metastasis.  MRI of the brain negative on 06/05/2019 for metastatic disease. - Right supraclavicular lymph node biopsy on 05/20/2019 shows metastatic poorly differentiated squamous cell carcinoma, positive for pankeratin, P 16, CK 7, and CK 5/6. - Based on her PDL 1 results, I have recommended combination chemoimmunotherapy with carboplatin, paclitaxel and pembrolizumab.  We discussed the side effects in detail.  I also called and talked to both her daughters. - I have reviewed her blood work today.  Apart from mild hypokalemia with potassium of 3.3, other labs were normal. -We will proceed with her first cycle today.  I will reevaluate her in 1 week.  2.  Low back pain: - She cannot take any narcotics including tramadol as they make her drowsy.  Lidocaine patch did not help. - She was taking Aleve.  She did get injection in the back on 06/09/2019.  This helped only minimally.  She is scheduled to see the back doctor again next week.  3.  Hypertension: -Blood pressure today is 94/80. -She is taking losartan 50 mg half tablet daily.  She takes one fourth of a tablet twice daily.  I have told her to take one fourth of a tablet daily.  She is also on carvedilol 3.125 mg daily.  Total time spent is 40 minutes with more than 50% of the time spent face-to-face discussing treatment  plan, side effects, counseling and coordination of care.  Orders placed this encounter:  No orders of the defined types were placed in this encounter.     Derek Jack, MD [facility] [phone]

## 2019-06-17 ENCOUNTER — Emergency Department (HOSPITAL_COMMUNITY): Payer: Medicare Other

## 2019-06-17 ENCOUNTER — Inpatient Hospital Stay (HOSPITAL_COMMUNITY): Payer: Medicare Other

## 2019-06-17 ENCOUNTER — Emergency Department (HOSPITAL_COMMUNITY)
Admission: EM | Admit: 2019-06-17 | Discharge: 2019-06-17 | Disposition: A | Discharge status: Home / Self Care | Payer: Medicare Other | Attending: Emergency Medicine | Admitting: Emergency Medicine

## 2019-06-17 ENCOUNTER — Encounter (HOSPITAL_COMMUNITY): Payer: Self-pay

## 2019-06-17 ENCOUNTER — Other Ambulatory Visit: Payer: Self-pay

## 2019-06-17 VITALS — BP 77/37 | HR 39 | Temp 97.3°F | Resp 20

## 2019-06-17 DIAGNOSIS — C799 Secondary malignant neoplasm of unspecified site: Secondary | ICD-10-CM

## 2019-06-17 DIAGNOSIS — Z8673 Personal history of transient ischemic attack (TIA), and cerebral infarction without residual deficits: Secondary | ICD-10-CM | POA: Insufficient documentation

## 2019-06-17 DIAGNOSIS — Z7982 Long term (current) use of aspirin: Secondary | ICD-10-CM | POA: Insufficient documentation

## 2019-06-17 DIAGNOSIS — C7801 Secondary malignant neoplasm of right lung: Secondary | ICD-10-CM | POA: Insufficient documentation

## 2019-06-17 DIAGNOSIS — Z87891 Personal history of nicotine dependence: Secondary | ICD-10-CM | POA: Insufficient documentation

## 2019-06-17 DIAGNOSIS — I10 Essential (primary) hypertension: Secondary | ICD-10-CM | POA: Insufficient documentation

## 2019-06-17 DIAGNOSIS — R55 Syncope and collapse: Secondary | ICD-10-CM

## 2019-06-17 DIAGNOSIS — Z79899 Other long term (current) drug therapy: Secondary | ICD-10-CM | POA: Insufficient documentation

## 2019-06-17 DIAGNOSIS — R1032 Left lower quadrant pain: Secondary | ICD-10-CM | POA: Insufficient documentation

## 2019-06-17 LAB — CBC WITH DIFFERENTIAL/PLATELET
Abs Immature Granulocytes: 0.04 10*3/uL (ref 0.00–0.07)
Basophils Absolute: 0 10*3/uL (ref 0.0–0.1)
Basophils Relative: 0 %
Eosinophils Absolute: 0 10*3/uL (ref 0.0–0.5)
Eosinophils Relative: 0 %
HCT: 33.8 % — ABNORMAL LOW (ref 36.0–46.0)
Hemoglobin: 10.7 g/dL — ABNORMAL LOW (ref 12.0–15.0)
Immature Granulocytes: 0 %
Lymphocytes Relative: 14 %
Lymphs Abs: 1.4 10*3/uL (ref 0.7–4.0)
MCH: 28.5 pg (ref 26.0–34.0)
MCHC: 31.7 g/dL (ref 30.0–36.0)
MCV: 89.9 fL (ref 80.0–100.0)
Monocytes Absolute: 0.5 10*3/uL (ref 0.1–1.0)
Monocytes Relative: 5 %
Neutro Abs: 8.5 10*3/uL — ABNORMAL HIGH (ref 1.7–7.7)
Neutrophils Relative %: 81 %
Platelets: 263 10*3/uL (ref 150–400)
RBC: 3.76 MIL/uL — ABNORMAL LOW (ref 3.87–5.11)
RDW: 14.6 % (ref 11.5–15.5)
WBC: 10.4 10*3/uL (ref 4.0–10.5)
nRBC: 0 % (ref 0.0–0.2)

## 2019-06-17 LAB — CBG MONITORING, ED: Glucose-Capillary: 107 mg/dL — ABNORMAL HIGH (ref 70–99)

## 2019-06-17 LAB — COMPREHENSIVE METABOLIC PANEL
ALT: 26 U/L (ref 0–44)
AST: 36 U/L (ref 15–41)
Albumin: 3.3 g/dL — ABNORMAL LOW (ref 3.5–5.0)
Alkaline Phosphatase: 85 U/L (ref 38–126)
Anion gap: 9 (ref 5–15)
BUN: 20 mg/dL (ref 8–23)
CO2: 22 mmol/L (ref 22–32)
Calcium: 8.4 mg/dL — ABNORMAL LOW (ref 8.9–10.3)
Chloride: 104 mmol/L (ref 98–111)
Creatinine, Ser: 0.68 mg/dL (ref 0.44–1.00)
GFR calc Af Amer: >60 mL/min (ref >60)
GFR calc non Af Amer: >60 mL/min (ref >60)
Glucose, Bld: 104 mg/dL — ABNORMAL HIGH (ref 70–99)
Potassium: 3.4 mmol/L — ABNORMAL LOW (ref 3.5–5.1)
Sodium: 135 mmol/L (ref 135–145)
Total Bilirubin: 0.7 mg/dL (ref 0.3–1.2)
Total Protein: 6.6 g/dL (ref 6.5–8.1)

## 2019-06-17 LAB — URINALYSIS, ROUTINE W REFLEX MICROSCOPIC
Bilirubin Urine: NEGATIVE
Glucose, UA: NEGATIVE mg/dL
Hgb urine dipstick: NEGATIVE
Ketones, ur: NEGATIVE mg/dL
Leukocytes,Ua: NEGATIVE
Nitrite: NEGATIVE
Protein, ur: NEGATIVE mg/dL
Specific Gravity, Urine: 1.01 (ref 1.005–1.030)
pH: 7 (ref 5.0–8.0)

## 2019-06-17 LAB — TROPONIN I (HIGH SENSITIVITY)
Troponin I (High Sensitivity): 3 ng/L (ref <18)
Troponin I (High Sensitivity): 4 ng/L (ref <18)

## 2019-06-17 LAB — LIPASE, BLOOD: Lipase: 40 U/L (ref 11–51)

## 2019-06-17 MED ORDER — HEPARIN SOD (PORK) LOCK FLUSH 100 UNIT/ML IV SOLN
500.0000 [IU] | Freq: Once | INTRAVENOUS | Status: AC
  Administered 2019-06-17: 500 [IU] via INTRAVENOUS
  Filled 2019-06-17: qty 5

## 2019-06-17 MED ORDER — MORPHINE SULFATE (PF) 2 MG/ML IV SOLN
2.0000 mg | Freq: Once | INTRAVENOUS | Status: DC
  Filled 2019-06-17: qty 1

## 2019-06-17 MED ORDER — ONDANSETRON HCL 4 MG/2ML IJ SOLN
4.0000 mg | Freq: Once | INTRAMUSCULAR | Status: DC
  Filled 2019-06-17: qty 2

## 2019-06-17 MED ORDER — PEGFILGRASTIM-JMDB 6 MG/0.6ML ~~LOC~~ SOSY
6.0000 mg | PREFILLED_SYRINGE | Freq: Once | SUBCUTANEOUS | Status: AC
  Administered 2019-06-17: 6 mg via SUBCUTANEOUS
  Filled 2019-06-17: qty 0.6

## 2019-06-17 MED ORDER — ASPIRIN 81 MG PO CHEW
324.0000 mg | CHEWABLE_TABLET | Freq: Once | ORAL | Status: AC
  Administered 2019-06-17: 324 mg via ORAL
  Filled 2019-06-17: qty 4

## 2019-06-17 MED ORDER — IOHEXOL 300 MG/ML  SOLN
100.0000 mL | Freq: Once | INTRAMUSCULAR | Status: AC | PRN
  Administered 2019-06-17: 100 mL via INTRAVENOUS

## 2019-06-17 MED ORDER — FAMOTIDINE IN NACL 20-0.9 MG/50ML-% IV SOLN
20.0000 mg | Freq: Once | INTRAVENOUS | Status: AC
  Administered 2019-06-17: 20 mg via INTRAVENOUS
  Filled 2019-06-17: qty 50

## 2019-06-17 MED ORDER — SODIUM CHLORIDE 0.9 % IV BOLUS
500.0000 mL | Freq: Once | INTRAVENOUS | Status: AC
  Administered 2019-06-17: 500 mL via INTRAVENOUS

## 2019-06-17 MED ORDER — LACTULOSE 20 GM/30ML PO SOLN: ORAL | 1 refills | Status: DC

## 2019-06-17 NOTE — ED Triage Notes (Signed)
Pt was in cancer center for nulesta.  Staff reports pt on chemo.  Pt started feeling faint.  Staff reports bp 70 systolic and HR in 02'M.  Pt minimally responsive at this time.

## 2019-06-17 NOTE — ED Provider Notes (Signed)
[facility] EMERGENCY DEPARTMENT Provider Note   CSN: [id number] Arrival date & time: 06/17/19  1425     History   Chief Complaint Chief Complaint  Patient presents with  . Hypotension    HPI Lindsey Mullins is a 73 y.o. female with a history of hypertension, hyperlipidemia, mesenteric artery arterial sclerosis, GERD, history of atypical chest pain and diagnosis of metastatic right lung cancer who started chemotherapy 2 days ago, returned today for a Neulasta injection, after which she proceeded to a local store, became lightheaded and had a near syncopal event and was found to be hypotensive with a blood pressure of 70 systolic and a heart rate in the 30s.  Her fianc was driving at the time, returned her to the cancer center and she was promptly brought to [place].  She denies chest pain, shortness of breath, does endorse extreme fatigue and lightheadedness.  She denies headache or focal weakness.  She has had problems with constipation and endorses left lower abdominal pain, stating her last bowel movement was more than a week ago despite MiraLAX.     HPI  Past Medical History:  Diagnosis Date  . Atypical chest pain 05/23/2012   STRESS TEST - small to moderate sized area of partial reversibility of the anteroapical wall, most likely breast artifact, post-stress EF 67%, EKG show NSR at 65, No Lexiscan EKG changes, non-diagnostic for ischemia; STRESS TEST, 01/25/2010 - normal study, post-stress EF 66%, no significant ischemia  . Cerebral atherosclerosis 09/29/2012   CAROTID DUPLEX - RIGHT  BULB/PROXIMAL ICA-moderate amount of fibrous plaque, 50-69% diameter reduction; LEFT CEA-normal, no significant diameter reduction  . Colitis   . GERD (gastroesophageal reflux disease)   . Hyperlipidemia   . Hypertension   . Osteopenia   . Restless leg syndrome   . Shortness of breath 03/30/2005   2D ECHO - EF >55%, normal  . TIA (transient ischemic attack) 01/27/2010   2D ECHO - EF 65%, normal     Patient Active Problem List   Diagnosis Date Noted  . Malignant neoplasm of right lung ([place])   . Goals of care, counseling/discussion 06/10/2019  . Squamous cell carcinoma of lung, stage IV, right ([place]) 05/28/2019  . Metastatic cancer ([place]) 05/26/2019  . Cervical lymphadenopathy   . Compression fracture of L2 ([place]) 04/29/2019  . Arteriosclerosis, mesenteric artery ([place]) 12/31/2018  . Abdominal pain 12/22/2018  . Colitis 12/22/2018  . Osteopenia   . DDD (degenerative disc disease), lumbar 05/11/2016  . Obesity 05/11/2016  . Glucose intolerance (impaired glucose tolerance) 01/12/2016  . Pain in the chest   . Chest pain 01/10/2016  . GERD (gastroesophageal reflux disease) 01/26/2015  . Stress and adjustment reaction 01/26/2015  . Dysuria 11/30/2013  . Carotid artery disease ([place]) 04/20/2013  . Bunion, left 09/21/2012  . Overweight(278.02) 06/04/2012  . Essential hypertension, benign 04/02/2012  . Hyperlipidemia 04/02/2012  . Dysphagia 04/02/2012  . RLS (restless legs syndrome) 04/02/2012    Past Surgical History:  Procedure Laterality Date  . ABDOMINAL EXPLORATION SURGERY    . CAROTID ENDARTERECTOMY    . LYMPH NODE BIOPSY N/A 05/20/2019   Procedure: LYMPH NODE BIOPSY CERVICAL;  Surgeon: Aviva Signs, MD;  Location: [facility];  Service: General;  Laterality: N/A;  . Peripheral Vascular Catheterization  07/16/2005   Right verterbral-50% smooth segmental badsilar artery stenosis on right side, Right carotid-50% ulcerated proxmial stenosis, Left carotid-60 to 70% left externam carotid artery stenosis, 90% proximal left ICA stenosis  . PORTACATH PLACEMENT Left 06/12/2019  Procedure: INSERTION PORT-A-CATH (catheter attached left subclavian);  Surgeon: Aviva Signs, MD;  Location: [facility];  Service: General;  Laterality: Left;     OB History    Gravida  2   Para  2   Term  2   Preterm      AB      Living        SAB      TAB      Ectopic      Multiple      Live Births                Home Medications    Prior to Admission medications   Medication Sig Start Date End Date Taking? Authorizing Provider  acetaminophen (TYLENOL) 325 MG tablet Take 325-650 mg by mouth every 6 (six) hours as needed (for headaches or pain).    [provider]  aspirin 81 MG tablet Take 81 mg by mouth daily.     [provider]  CARBOPLATIN IV Inject into the vein every 21 ( twenty-one) days. 06/15/19   [provider]  carvedilol (COREG) 3.125 MG tablet TAKE 1 TABLET BY MOUTH ONCE DAILY Patient taking differently: Take 3.125 mg by mouth daily.  08/25/18   Maywood, Modena Nunnery, MD  cyclobenzaprine (FLEXERIL) 5 MG tablet TAKE 1 TABLET BY MOUTH AT BEDTIME AS NEEDED FOR MUSCLE SPASM OR  LEG  CRAMPS Patient taking differently: Take 5 mg by mouth at bedtime. TAKE 1 TABLET BY MOUTH AT BEDTIME AS NEEDED FOR MUSCLE SPASM OR  LEG  CRAMPS 06/10/18   [name], MD  Lactulose 20 GM/30ML SOLN Lactulose 10gm.  Take 8ml to 72ml three times a day to assist with moving bowels.  Titrate down if having multiple bowel movements.  Call if no results after taking the medication three times a day. 06/17/19   Roger Shelter, FNP  lidocaine (LIDODERM) 5 % Place 1 patch onto the skin daily. Remove & Discard patch within 12 hours or as directed by MD 05/11/19   Francene Finders L, NP-C  lidocaine-prilocaine (EMLA) cream Apply 1 application topically as needed. Apply small amount to port a cath site and cover with plastic wrap one hour prior to chemotherapy appointments 06/09/19   Derek Jack, MD  loratadine (CLARITIN) 10 MG tablet Take 10 mg by mouth daily.    [provider]  losartan (COZAAR) 50 MG tablet TAKE 1/2 (ONE-HALF) TABLET BY MOUTH ONCE DAILY Patient taking differently: Take 25 mg by mouth daily.  06/10/18   Lorretta Harp, MD  naproxen sodium (ALEVE) 220 MG tablet Take 220 mg by mouth 2 (two) times daily as needed.    [provider]   nitroGLYCERIN (NITROSTAT) 0.4 MG SL tablet DISSOLVE ONE TABLET UNDER THE TONGUE AS NEEDED FOR CHEST PAIN**MAX 3 TABLETS EACH 5 MINUTES APART** Patient taking differently: Place 0.4 mg under the tongue every 5 (five) minutes as needed for chest pain.  09/14/16   [name], MD  Omega-3 Krill Oil 500 MG CAPS Take 500 mg by mouth daily.     [provider]  omeprazole (PRILOSEC) 20 MG capsule Take 1 po daily Patient taking differently: Take 20 mg by mouth daily as needed (acid reflux).  12/31/18   [name], MD  PACLITAXEL IV Inject into the vein every 21 ( twenty-one) days. 06/15/19   [provider]  PEMBROLIZUMAB IV Inject into the vein every 21 ( twenty-one) days. 06/15/19  [provider]  prochlorperazine (COMPAZINE) 10 MG tablet Take 1 tablet (10 mg total) by mouth every 6 (six) hours as needed for nausea or vomiting. 06/09/19   Derek Jack, MD  rosuvastatin (CRESTOR) 20 MG tablet Take 1 tablet (20 mg total) by mouth daily. Patient taking differently: Take 10 mg by mouth 2 (two) times a day.  12/31/18   [name], MD  tiZANidine (ZANAFLEX) 4 MG tablet Take 4 mg by mouth.  03/18/19   [provider]  traMADol (ULTRAM) 50 MG tablet Take 1 tablet (50 mg total) by mouth every 6 (six) hours as needed. 05/20/19   Aviva Signs, MD    Family History Family History  Problem Relation Age of Onset  . Heart disease Mother   . Hypertension Mother   . Hypertension Father   . Heart disease Father     Social History Social History   Tobacco Use  . Smoking status: Former Smoker    Packs/day: 0.25    Years: 10.00    Pack years: 2.50    Types: Cigarettes    Quit date: 04/21/1983    Years since quitting: 36.1  . Smokeless tobacco: Never Used  Substance Use Topics  . Alcohol use: No  . Drug use: No     Allergies   Penicillins, Codeine, Lipitor [atorvastatin], Lisinopril, and Pravastatin   Review of Systems Review of Systems   Constitutional: Positive for fatigue. Negative for fever.  HENT: Negative for congestion and sore throat.   Eyes: Negative.   Respiratory: Negative for chest tightness and shortness of breath.   Cardiovascular: Negative for chest pain.  Gastrointestinal: Positive for abdominal pain and constipation. Negative for nausea.  Genitourinary: Negative.   Musculoskeletal: Negative for arthralgias, joint swelling and neck pain.  Skin: Negative.  Negative for rash and wound.  Neurological: Positive for weakness and light-headedness. Negative for dizziness, numbness and headaches.  Psychiatric/Behavioral: Negative.      Physical Exam Updated Vital Signs BP (!) 191/89   Pulse (!) 57   Temp (!) 97.5 F (36.4 C) (Oral)   Resp 12   Ht 5\' 6"  (1.676 m)   Wt 77.1 kg   SpO2 100%   BMI 27.44 kg/m   Physical Exam Vitals signs and nursing note reviewed.  Constitutional:      Appearance: She is well-developed.  HENT:     Head: Normocephalic and atraumatic.     Mouth/Throat:     Mouth: Mucous membranes are moist.  Eyes:     Conjunctiva/sclera: Conjunctivae normal.  Neck:     Musculoskeletal: Normal range of motion.  Cardiovascular:     Rate and Rhythm: Normal rate and regular rhythm.     Heart sounds: Normal heart sounds.  Pulmonary:     Effort: Pulmonary effort is normal.     Breath sounds: Normal breath sounds. No wheezing.  Abdominal:     General: Bowel sounds are normal.     Palpations: Abdomen is soft.     Tenderness: There is abdominal tenderness in the left lower quadrant. There is no guarding.  Musculoskeletal: Normal range of motion.  Skin:    General: Skin is warm and dry.  Neurological:     Mental Status: She is alert.      ED Treatments / Results  Labs (all labs ordered are listed, but only abnormal results are displayed) Labs Reviewed  CBC WITH DIFFERENTIAL/PLATELET - Abnormal; Notable for the following components:      Result Value  RBC 3.76 (*)    Hemoglobin  10.7 (*)    HCT 33.8 (*)    Neutro Abs 8.5 (*)    All other components within normal limits  COMPREHENSIVE METABOLIC PANEL - Abnormal; Notable for the following components:   Potassium 3.4 (*)    Glucose, Bld 104 (*)    Calcium 8.4 (*)    Albumin 3.3 (*)    All other components within normal limits  CBG MONITORING, ED - Abnormal; Notable for the following components:   Glucose-Capillary 107 (*)    All other components within normal limits  LIPASE, BLOOD  CBG MONITORING, ED  TROPONIN I (HIGH SENSITIVITY)  TROPONIN I (HIGH SENSITIVITY)    EKG None  Radiology Dg Chest Portable 1 View  Result Date: 06/17/2019 CLINICAL DATA:  Chest pain, hypotension EXAM: PORTABLE CHEST 1 VIEW COMPARISON:  06/12/2019 FINDINGS: Left-sided Port-A-Cath remains in place with distal tip terminating at the level of the SVC. The heart size and mediastinal contours are within normal limits. No focal airspace consolidation, pleural effusion, or pneumothorax. IMPRESSION: No acute cardiopulmonary process. Electronically Signed   By: Davina Poke M.D.   On: 06/17/2019 16:07   Dg Abd Portable 2 Views  Result Date: 06/17/2019 CLINICAL DATA:  She was sent to the emergency room department for low blood pressure and low heart rate. At the time of my evaluation the patient had a normal blood pressure and her heart rate showed bradycardia. Pt denies any on body medical injector devices. EXAM: PORTABLE ABDOMEN - 2 VIEW COMPARISON:  None. FINDINGS: There is a moderate amount of stool in the colon. There is no bowel dilatation to suggest obstruction. There is no evidence of pneumoperitoneum, portal venous gas or pneumatosis. There are no pathologic calcifications along the expected course of the ureters. The osseous structures are unremarkable. IMPRESSION: Negative. Electronically Signed   By: Kathreen Devoid   On: 06/17/2019 16:08    Procedures Procedures (including critical care time)  Medications Ordered in ED  Medications  ondansetron (ZOFRAN) injection 4 mg (4 mg Intravenous Refused 06/17/19 1544)  morphine 2 MG/ML injection 2 mg (2 mg Intravenous Refused 06/17/19 1543)  sodium chloride 0.9 % bolus 500 mL (0 mLs Intravenous Stopped 06/17/19 1541)  aspirin chewable tablet 324 mg (324 mg Oral Given 06/17/19 1544)  famotidine (PEPCID) IVPB 20 mg premix (0 mg Intravenous Stopped 06/17/19 1644)     Initial Impression / Assessment and Plan / ED Course  I have reviewed the triage vital signs and the nursing notes.  Pertinent labs & imaging results that were available during my care of the patient were reviewed by me and considered in my medical decision making (see chart for details).        Pt with weakness and hypotension after receiving neulasta injection at the cancer center this am. Also c/o abd pain and constipation.  Labs imaging reviewed, no acute findings, moderate constipation.    Discussed with Dr Wilson Singer who assumes care.  CT abd/pelvis to assess abd pain.  UA. dispo pending results.  Final Clinical Impressions(s) / ED Diagnoses   Final diagnoses:  None    ED Discharge Orders    None       Landis Martins 06/17/19 1730    Virgel Manifold, MD 06/17/19 1734

## 2019-06-17 NOTE — Progress Notes (Signed)
Patient complains of constipation and bad indigestion.  Last bowel movement was last week. She has been using colace and senokot with no results.  Patient is passing gas only.  Reviewed with the nurse practitioner with verbal order to send lactulose to her pharmacy and take TID until she has a bowel movement.  Patient also instructed to take Maalox for her indigestion per the NP.   Instructed the patient to call if no results from the lactulose TID and no improvement with her indigestion with understanding verbalized.  Patient warm and dry and oriented.  No s/s of distress noted.   Patient tolerated injection with no complaints voiced.  Reviewed with the patient the purpose of the injection and side effects with understanding verbalized.  Site clean and dry with no bruising or swelling noted at site.  Band aid applied.  Vss with discharge and left by wheelchair with no s/s of distress noted.    1355-patient returned to the cancer center floor stating she was "hurting bad" from the Fulphila injection given in her right arm.  Injection site clean and dry with no bruising or swelling noted at site. Patient assessed and was clammy to touch, slouched in wheelchair with eyes closed, and stated she hurt all over.  Responsive to all questions asked and would open eyes when spoken to.  Patient stated she continues to have indigestion and abdominal pain with no worsening of her back pain.   Patients vital signs taken and documented with the nurse practitioner in the room.  Patients port accessed with KVO normal saline fluids started verbal order Reynolds Bowl, NP, and transported to the emergency room for evaluation.

## 2019-06-17 NOTE — ED Provider Notes (Addendum)
Patient received a Neulasta shot at the cancer center.  She felt faint after the shot.  She was sent to the emergency room department for low blood pressure and low heart rate.  At the time of my evaluation the patient had a normal blood pressure and her heart rate showed bradycardia around 46.  We will bolus her with some fluids.   Labs 2 days ago, CBC and chemistries were normal   Milton Ferguson, MD 06/17/19 1439    Milton Ferguson, MD 06/17/19 1439

## 2019-06-17 NOTE — ED Notes (Signed)
Pt to CT

## 2019-06-18 ENCOUNTER — Encounter (HOSPITAL_COMMUNITY): Payer: Self-pay

## 2019-06-18 ENCOUNTER — Other Ambulatory Visit: Payer: Self-pay

## 2019-06-18 ENCOUNTER — Encounter (HOSPITAL_COMMUNITY): Payer: Self-pay | Admitting: *Deleted

## 2019-06-18 ENCOUNTER — Other Ambulatory Visit (HOSPITAL_COMMUNITY): Payer: Self-pay | Admitting: Hematology

## 2019-06-18 ENCOUNTER — Emergency Department (HOSPITAL_COMMUNITY)
Admission: EM | Admit: 2019-06-18 | Discharge: 2019-06-18 | Discharge status: Absent without leave | Payer: Medicare Other

## 2019-06-18 ENCOUNTER — Emergency Department (HOSPITAL_COMMUNITY)
Admission: EM | Admit: 2019-06-18 | Discharge: 2019-06-19 | Disposition: A | Discharge status: Home / Self Care | Payer: Medicare Other | Attending: Emergency Medicine | Admitting: Emergency Medicine

## 2019-06-18 DIAGNOSIS — R1084 Generalized abdominal pain: Secondary | ICD-10-CM | POA: Insufficient documentation

## 2019-06-18 DIAGNOSIS — Z79899 Other long term (current) drug therapy: Secondary | ICD-10-CM | POA: Insufficient documentation

## 2019-06-18 DIAGNOSIS — Z7982 Long term (current) use of aspirin: Secondary | ICD-10-CM | POA: Diagnosis not present

## 2019-06-18 DIAGNOSIS — R109 Unspecified abdominal pain: Secondary | ICD-10-CM | POA: Diagnosis present

## 2019-06-18 DIAGNOSIS — I2510 Atherosclerotic heart disease of native coronary artery without angina pectoris: Secondary | ICD-10-CM | POA: Insufficient documentation

## 2019-06-18 DIAGNOSIS — Z85118 Personal history of other malignant neoplasm of bronchus and lung: Secondary | ICD-10-CM | POA: Insufficient documentation

## 2019-06-18 DIAGNOSIS — K5900 Constipation, unspecified: Secondary | ICD-10-CM

## 2019-06-18 DIAGNOSIS — Z87891 Personal history of nicotine dependence: Secondary | ICD-10-CM | POA: Insufficient documentation

## 2019-06-18 DIAGNOSIS — I10 Essential (primary) hypertension: Secondary | ICD-10-CM | POA: Diagnosis not present

## 2019-06-18 HISTORY — DX: Malignant (primary) neoplasm, unspecified: C80.1

## 2019-06-18 LAB — COMPREHENSIVE METABOLIC PANEL
ALT: 25 U/L (ref 0–44)
AST: 35 U/L (ref 15–41)
Albumin: 3.6 g/dL (ref 3.5–5.0)
Alkaline Phosphatase: 94 U/L (ref 38–126)
Anion gap: 11 (ref 5–15)
BUN: 19 mg/dL (ref 8–23)
CO2: 24 mmol/L (ref 22–32)
Calcium: 8.8 mg/dL — ABNORMAL LOW (ref 8.9–10.3)
Chloride: 96 mmol/L — ABNORMAL LOW (ref 98–111)
Creatinine, Ser: 0.5 mg/dL (ref 0.44–1.00)
GFR calc Af Amer: >60 mL/min (ref >60)
GFR calc non Af Amer: >60 mL/min (ref >60)
Glucose, Bld: 87 mg/dL (ref 70–99)
Potassium: 3.5 mmol/L (ref 3.5–5.1)
Sodium: 131 mmol/L — ABNORMAL LOW (ref 135–145)
Total Bilirubin: 1.6 mg/dL — ABNORMAL HIGH (ref 0.3–1.2)
Total Protein: 7.1 g/dL (ref 6.5–8.1)

## 2019-06-18 LAB — CBC
HCT: 35.4 % — ABNORMAL LOW (ref 36.0–46.0)
Hemoglobin: 11.5 g/dL — ABNORMAL LOW (ref 12.0–15.0)
MCH: 28.9 pg (ref 26.0–34.0)
MCHC: 32.5 g/dL (ref 30.0–36.0)
MCV: 88.9 fL (ref 80.0–100.0)
Platelets: 221 10*3/uL (ref 150–400)
RBC: 3.98 MIL/uL (ref 3.87–5.11)
RDW: 14.5 % (ref 11.5–15.5)
WBC: 24.4 10*3/uL — ABNORMAL HIGH (ref 4.0–10.5)
nRBC: 0 % (ref 0.0–0.2)

## 2019-06-18 LAB — LIPASE, BLOOD: Lipase: 44 U/L (ref 11–51)

## 2019-06-18 MED ORDER — MILK AND MOLASSES ENEMA
1.0000 | Freq: Once | RECTAL | Status: AC
  Administered 2019-06-19: 240 mL via RECTAL
  Filled 2019-06-18: qty 240

## 2019-06-18 MED ORDER — ONDANSETRON HCL 4 MG/2ML IJ SOLN
4.0000 mg | Freq: Once | INTRAMUSCULAR | Status: AC
  Administered 2019-06-18: 21:00:00 4 mg via INTRAVENOUS
  Filled 2019-06-18: qty 2

## 2019-06-18 MED ORDER — SODIUM CHLORIDE 0.9 % IV BOLUS
1000.0000 mL | Freq: Once | INTRAVENOUS | Status: AC
  Administered 2019-06-18: 1000 mL via INTRAVENOUS

## 2019-06-18 MED ORDER — SODIUM CHLORIDE 0.9% FLUSH: 3.0000 mL | Freq: Once | INTRAVENOUS | Status: DC

## 2019-06-18 MED ORDER — FENTANYL CITRATE (PF) 100 MCG/2ML IJ SOLN
12.5000 ug | Freq: Once | INTRAMUSCULAR | Status: AC
  Administered 2019-06-19: 12.5 ug via INTRAVENOUS

## 2019-06-18 MED ORDER — FENTANYL CITRATE (PF) 100 MCG/2ML IJ SOLN
12.5000 ug | Freq: Once | INTRAMUSCULAR | Status: AC
  Administered 2019-06-18: 21:00:00 12.5 ug via INTRAVENOUS
  Filled 2019-06-18: qty 2

## 2019-06-18 NOTE — ED Provider Notes (Signed)
11:11 PM Patient care assumed from MD [facility] at shift change. Hx of metastatic lung CA with constipation x 5 days. Onset of abdominal pain 1.5 days ago. Seen yesterday at [facility] yesterday with CT showing stool burden, but no obstruction. She has had some nausea, no vomiting. Work up today with worsening leukocytosis, though this is nonspecific. No focal abdominal TTP or peritoneal signs on exam by prior provider.  Plan for soap suds enema and UA evaluation. Patient received a soapsuds enema without passage of stool.  Will progress to milk and molasses enema.  Still awaiting urinalysis.  Family updated.  Per RN, patient also with some worsening pain.  She was ordered to receive an additional 12.5mg  Fentanyl as this helped with her pain when initially seen.  12:55 AM Patient reassessed.  She had a good sized bowel movement, per daughter, following milk and molasses enema.  I also clarified with the daughter when the patient had her Neulasta injection.  This was given 48 hours ago and correlates with change in white blood cell count.  Daughter states that the patient has been doing much worse since this Neulasta injection.  Had to be seen in the emergency department for near syncope and was subsequently evaluated for abdominal pain.  The patient is currently complaining of pain across her low back.  She has had pain in a similar location associated with her metastasis.  This was able to be controlled with Aleve prior to administration of her Neulasta injection.  On repeat abdominal exam, abdomen is soft.  There is no focal tenderness.  No peritoneal signs.  The patient does have some mild tenderness across her lumbar region and paraspinal muscles.  Plan to try and control pain with additional dose of IV Dilaudid.  We will also give a tablet of Percocet.  Discussed with daughter that patient can likely be discharged with a short course of pain medicine for outpatient symptom control, though she will also need  to be diligent about continuing on a bowel regimen to prevent future constipation.  Daughter verbalizes understanding.  2:46 AM Patient appears much more comfortable.  She states that her pain is better controlled.  Urinalysis reassuring.  She expresses comfort being discharged with a short course of pain medication.  Have encouraged her to follow-up with her oncologist later on today.  Return precautions discussed and provided. Patient discharged in stable condition with no unaddressed concerns.   Vitals:   06/18/19 1817 06/18/19 1830 06/18/19 2159 06/19/19 0205  BP: 140/84  (!) 151/72 133/75  Pulse: 75  63 80  Resp: 18  18 16   Temp: 98.1 F (36.7 C)     TempSrc: Oral     SpO2: 100%  100% 100%  Weight:  77.1 kg    Height:  5\' 6"  (1.676 m)        Antonietta Breach, PA-C 06/19/19 [place], Delice Bison, DO 06/19/19 [id number]

## 2019-06-18 NOTE — ED Notes (Signed)
Materials called to bring more soap suds enema bags.

## 2019-06-18 NOTE — Progress Notes (Signed)
I received a call from Lorane Gell, patient's daughter, this morning and she was asking about mom's abdominal pain and inability to have a bowel movement.  She states that she did have a very small bowel movement this morning but still is having discomfort and indigestion.  We discussed in great detail the importance of keeping a healthy bowel regimen and the side effects of getting constipated.  We discussed starting a bowel regimen with stool softners, increasing fluid intake and trying to have bowel movement daily to every other day.  We discussed taking the lactulose that was called in for her and she states that her mom is very sensitive to medications and doesn't want to take the lactulose if it is absolutely not needed.  I encouraged her to try and take it and get her mom some relief today. I also told her that she could use an enema to get immediate relief.  She verbalizes understanding of the plan and importance of keeping her mom on a regular bowel regimen.  She is encouraged to call the clinic should she need any further help.

## 2019-06-18 NOTE — ED Triage Notes (Signed)
Patient c/o generalized abdominal pain that radiates into the back. Patient states no BM x 9 days. Patiaent received Nulesta injection yesterday.

## 2019-06-18 NOTE — ED Provider Notes (Signed)
[facility] DEPT Provider Note   CSN: [id number] Arrival date & time: 06/18/19  1801    History   Chief Complaint Chief Complaint  Patient presents with  . Abdominal Pain  . Cancer patient  . Constipation    HPI Lindsey Mullins is a 73 y.o. female.     HPI Patient presents with her daughter who assists with the HPI. Patient has multiple medical issues, most notably metastatic lung cancer Patient notes that about 5 days ago she had diminished stool output. However, only over the past 24/36 hours has developed abdominal pain. This is diffuse, though more prominent in the lower abdomen, bilateral inguinal canals. Pain is sore, severe, with associated nausea, anorexia. No vomiting. However, with her anorexia and pain she is unable to take any medication for relief. Patient was seen yesterday at our affiliated facility, had labs, CT scan performed. CT notable for some evidence for constipation, no obstruction. Also notable for demonstration of splenic metastases, spinal metastases. With worsening abdominal pain she presents for evaluation peer She denies interval fever, chest pain, dyspnea, confusion, disorientation, though she is socially withdrawn, minimally interactive during the initial eval. Past Medical History:  Diagnosis Date  . Atypical chest pain 05/23/2012   STRESS TEST - small to moderate sized area of partial reversibility of the anteroapical wall, most likely breast artifact, post-stress EF 67%, EKG show NSR at 65, No Lexiscan EKG changes, non-diagnostic for ischemia; STRESS TEST, 01/25/2010 - normal study, post-stress EF 66%, no significant ischemia  . Cancer ([place])   . Cerebral atherosclerosis 09/29/2012   CAROTID DUPLEX - RIGHT  BULB/PROXIMAL ICA-moderate amount of fibrous plaque, 50-69% diameter reduction; LEFT CEA-normal, no significant diameter reduction  . Colitis   . GERD (gastroesophageal reflux disease)   . Hyperlipidemia   .  Hypertension   . Osteopenia   . Restless leg syndrome   . Shortness of breath 03/30/2005   2D ECHO - EF >55%, normal  . TIA (transient ischemic attack) 01/27/2010   2D ECHO - EF 65%, normal    Patient Active Problem List   Diagnosis Date Noted  . Malignant neoplasm of right lung ([place])   . Goals of care, counseling/discussion 06/10/2019  . Squamous cell carcinoma of lung, stage IV, right ([place]) 05/28/2019  . Metastatic cancer ([place]) 05/26/2019  . Cervical lymphadenopathy   . Compression fracture of L2 ([place]) 04/29/2019  . Arteriosclerosis, mesenteric artery ([place]) 12/31/2018  . Abdominal pain 12/22/2018  . Colitis 12/22/2018  . Osteopenia   . DDD (degenerative disc disease), lumbar 05/11/2016  . Obesity 05/11/2016  . Glucose intolerance (impaired glucose tolerance) 01/12/2016  . Pain in the chest   . Chest pain 01/10/2016  . GERD (gastroesophageal reflux disease) 01/26/2015  . Stress and adjustment reaction 01/26/2015  . Dysuria 11/30/2013  . Carotid artery disease ([place]) 04/20/2013  . Bunion, left 09/21/2012  . Overweight(278.02) 06/04/2012  . Essential hypertension, benign 04/02/2012  . Hyperlipidemia 04/02/2012  . Dysphagia 04/02/2012  . RLS (restless legs syndrome) 04/02/2012    Past Surgical History:  Procedure Laterality Date  . ABDOMINAL EXPLORATION SURGERY    . CAROTID ENDARTERECTOMY    . LYMPH NODE BIOPSY N/A 05/20/2019   Procedure: LYMPH NODE BIOPSY CERVICAL;  Surgeon: Aviva Signs, MD;  Location: [facility];  Service: General;  Laterality: N/A;  . Peripheral Vascular Catheterization  07/16/2005   Right verterbral-50% smooth segmental badsilar artery stenosis on right side, Right carotid-50% ulcerated proxmial stenosis, Left carotid-60 to 70% left externam  carotid artery stenosis, 90% proximal left ICA stenosis  . PORTACATH PLACEMENT Left 06/12/2019   Procedure: INSERTION PORT-A-CATH (catheter attached left subclavian);  Surgeon: Aviva Signs, MD;  Location: [facility];   Service: General;  Laterality: Left;     OB History    Gravida  2   Para  2   Term  2   Preterm      AB      Living        SAB      TAB      Ectopic      Multiple      Live Births               Home Medications    Prior to Admission medications   Medication Sig Start Date End Date Taking? Authorizing Provider  aspirin 81 MG tablet Take 81 mg by mouth daily.    Yes [provider]  carvedilol (COREG) 3.125 MG tablet TAKE 1 TABLET BY MOUTH ONCE DAILY Patient taking differently: Take 3.125 mg by mouth daily.  08/25/18  Yes Obert, Modena Nunnery, MD  cyclobenzaprine (FLEXERIL) 5 MG tablet TAKE 1 TABLET BY MOUTH AT BEDTIME AS NEEDED FOR MUSCLE SPASM OR  LEG  CRAMPS Patient taking differently: Take 5 mg by mouth at bedtime as needed for muscle spasms.  06/10/18  Yes Bowlus, Modena Nunnery, MD  loratadine (CLARITIN) 10 MG tablet Take 10 mg by mouth daily as needed for allergies or itching.    Yes [provider]  losartan (COZAAR) 50 MG tablet TAKE 1/2 (ONE-HALF) TABLET BY MOUTH ONCE DAILY Patient taking differently: Take 25 mg by mouth daily.  06/10/18  Yes Lorretta Harp, MD  naproxen sodium (ALEVE) 220 MG tablet Take 220 mg by mouth 2 (two) times daily as needed (pain).    Yes [provider]  Omega-3 Krill Oil 500 MG CAPS Take 500 mg by mouth daily.    Yes [provider]  omeprazole (PRILOSEC) 20 MG capsule Take 1 po daily Patient taking differently: Take 20 mg by mouth daily as needed (acid reflux).  12/31/18  Yes Williams, Modena Nunnery, MD  prochlorperazine (COMPAZINE) 10 MG tablet Take 1 tablet (10 mg total) by mouth every 6 (six) hours as needed for nausea or vomiting. 06/09/19  Yes Derek Jack, MD  rosuvastatin (CRESTOR) 20 MG tablet Take 1 tablet (20 mg total) by mouth daily. Patient taking differently: Take 10 mg by mouth 2 (two) times daily as needed (cholesterol).  12/31/18  Yes Bennet, Modena Nunnery, MD  senna (SENOKOT) 8.6 MG TABS  tablet Take 2 tablets by mouth daily.   Yes [provider]  tiZANidine (ZANAFLEX) 4 MG tablet Take 4 mg by mouth daily as needed for muscle spasms.  03/18/19  Yes [provider]  traMADol (ULTRAM) 50 MG tablet Take 1 tablet (50 mg total) by mouth every 6 (six) hours as needed. Patient taking differently: Take 50 mg by mouth every 6 (six) hours as needed for moderate pain.  05/20/19  Yes Aviva Signs, MD  Lactulose 20 GM/30ML SOLN Lactulose 10gm.  Take 49ml to 79ml three times a day to assist with moving bowels.  Titrate down if having multiple bowel movements.  Call if no results after taking the medication three times a day. 06/17/19   Roger Shelter, FNP  lidocaine (LIDODERM) 5 % Place 1 patch onto the skin daily. Remove & Discard patch within 12 hours or as directed by MD Patient  not taking: Reported on 06/18/2019 05/11/19   Francene Finders L, NP-C  lidocaine-prilocaine (EMLA) cream Apply 1 application topically as needed. Apply small amount to port a cath site and cover with plastic wrap one hour prior to chemotherapy appointments 06/09/19   Derek Jack, MD  nitroGLYCERIN (NITROSTAT) 0.4 MG SL tablet DISSOLVE ONE TABLET UNDER THE TONGUE AS NEEDED FOR CHEST PAIN**MAX 3 TABLETS EACH 5 MINUTES APART** Patient taking differently: Place 0.4 mg under the tongue every 5 (five) minutes as needed for chest pain.  09/14/16   [name], MD    Family History Family History  Problem Relation Age of Onset  . Heart disease Mother   . Hypertension Mother   . Hypertension Father   . Heart disease Father     Social History Social History   Tobacco Use  . Smoking status: Former Smoker    Packs/day: 0.25    Years: 10.00    Pack years: 2.50    Types: Cigarettes    Quit date: 04/21/1983    Years since quitting: 36.1  . Smokeless tobacco: Never Used  Substance Use Topics  . Alcohol use: No  . Drug use: No     Allergies   Penicillins, Codeine, Lipitor [atorvastatin],  Lisinopril, and Pravastatin   Review of Systems Review of Systems  Constitutional:       Per HPI, otherwise negative  HENT:       Per HPI, otherwise negative  Respiratory:       Per HPI, otherwise negative  Cardiovascular:       Per HPI, otherwise negative  Gastrointestinal: Positive for abdominal distention, abdominal pain, constipation and nausea. Negative for vomiting.  Endocrine:       Negative aside from HPI  Genitourinary:       Neg aside from HPI   Musculoskeletal:       Per HPI, otherwise negative  Skin: Negative.   Allergic/Immunologic: Positive for immunocompromised state.  Neurological: Positive for weakness. Negative for syncope.     Physical Exam Updated Vital Signs BP 140/84 (BP Location: Left Arm)   Pulse 75   Temp 98.1 F (36.7 C) (Oral)   Resp 18   Ht 5\' 6"  (1.676 m)   Wt 77.1 kg   SpO2 100%   BMI 27.44 kg/m   Physical Exam Vitals signs and nursing note reviewed.  Constitutional:      Appearance: She is ill-appearing.     Comments: Uncomfortable appearing adult female  HENT:     Head: Normocephalic and atraumatic.  Eyes:     Conjunctiva/sclera: Conjunctivae normal.  Cardiovascular:     Rate and Rhythm: Normal rate and regular rhythm.  Pulmonary:     Effort: Pulmonary effort is normal. No respiratory distress.     Breath sounds: Normal breath sounds. No stridor.  Abdominal:     General: There is no distension.     Tenderness: There is generalized abdominal tenderness.  Skin:    General: Skin is warm and dry.  Neurological:     Mental Status: She is alert and oriented to person, place, and time.     Cranial Nerves: No cranial nerve deficit.  Psychiatric:     Comments: Withdrawn      ED Treatments / Results  Labs (all labs ordered are listed, but only abnormal results are displayed) Labs Reviewed  COMPREHENSIVE METABOLIC PANEL - Abnormal; Notable for the following components:      Result Value   Sodium 131 (*)  Chloride 96 (*)     Calcium 8.8 (*)    Total Bilirubin 1.6 (*)    All other components within normal limits  CBC - Abnormal; Notable for the following components:   WBC 24.4 (*)    Hemoglobin 11.5 (*)    HCT 35.4 (*)    All other components within normal limits  LIPASE, BLOOD  URINALYSIS, ROUTINE W REFLEX MICROSCOPIC    EKG None  Radiology Ct Abdomen Pelvis W Contrast  Result Date: 06/17/2019 CLINICAL DATA:  Left lower quadrant abdominal pain, weakness, constipation. EXAM: CT ABDOMEN AND PELVIS WITH CONTRAST TECHNIQUE: Multidetector CT imaging of the abdomen and pelvis was performed using the standard protocol following bolus administration of intravenous contrast. CONTRAST:  113mL OMNIPAQUE IOHEXOL 300 MG/ML  SOLN COMPARISON:  PET-CT dated 05/08/2019 FINDINGS: Lower chest: Pleural-based lesion in the medial right lower lobe along the right heart border (series 4/image 13), hypermetabolic on PET, suspicious for metastasis. Hepatobiliary: Two hypoenhancing lesions in the posterior right hepatic lobe measuring up to 12 mm (series 2/image 20), least one of which was hypermetabolic on PET, suspicious for metastases. Gallbladder is unremarkable. No intrahepatic or extrahepatic ductal dilatation. Pancreas: Within normal limits. Spleen: 23 mm hypoenhancing lesion in the inferior spleen, hypermetabolic on PET, suspicious for metastasis. Adrenals/Urinary Tract: Adrenal glands are within normal limits. Kidneys are within normal limits.  No hydronephrosis. Bladder is within normal limits. Stomach/Bowel: Stomach is within normal limits. No evidence of bowel obstruction. Normal appendix (series 2/image 60). Moderate colonic stool burden, suggesting constipation. No colonic wall thickening or mass is evident on CT. Vascular/Lymphatic: No evidence of abdominal aortic aneurysm. Atherosclerotic calcifications of the abdominal aorta and branch vessels. No suspicious abdominopelvic lymphadenopathy. Reproductive: Heterogeneous uterus  with calcified uterine fibroids. Bilateral ovaries are within normal limits. Other: No abdominopelvic ascites. Musculoskeletal: Multifocal sclerotic osseous metastases in the lumbar spine, with mild superior endplate compression fracture deformity at L2. These are better evaluated on recent PET. Sclerotic osseous metastasis involving the posterior right ischium (series 2/image 39). Additional multifocal osseous metastases involving the pelvis are better visualized on prior PET. IMPRESSION: Pleural-based metastasis in the medial right lower lobe. Patent can splenic metastases, as described above. Multifocal osseous metastases, better visualized recent PET. Moderate colonic stool burden, suggesting constipation. Otherwise, no acute findings to account for the patient's abdominal pain. Electronically Signed   By: Julian Hy M.D.   On: 06/17/2019 19:16   Dg Chest Portable 1 View  Result Date: 06/17/2019 CLINICAL DATA:  Chest pain, hypotension EXAM: PORTABLE CHEST 1 VIEW COMPARISON:  06/12/2019 FINDINGS: Left-sided Port-A-Cath remains in place with distal tip terminating at the level of the SVC. The heart size and mediastinal contours are within normal limits. No focal airspace consolidation, pleural effusion, or pneumothorax. IMPRESSION: No acute cardiopulmonary process. Electronically Signed   By: Davina Poke M.D.   On: 06/17/2019 16:07   Dg Abd Portable 2 Views  Result Date: 06/17/2019 CLINICAL DATA:  She was sent to the emergency room department for low blood pressure and low heart rate. At the time of my evaluation the patient had a normal blood pressure and her heart rate showed bradycardia. Pt denies any on body medical injector devices. EXAM: PORTABLE ABDOMEN - 2 VIEW COMPARISON:  None. FINDINGS: There is a moderate amount of stool in the colon. There is no bowel dilatation to suggest obstruction. There is no evidence of pneumoperitoneum, portal venous gas or pneumatosis. There are no  pathologic calcifications along the expected course of the  ureters. The osseous structures are unremarkable. IMPRESSION: Negative. Electronically Signed   By: Kathreen Devoid   On: 06/17/2019 16:08    Procedures Procedures (including critical care time)  Medications Ordered in ED Medications  sodium chloride flush (NS) 0.9 % injection 3 mL (has no administration in time range)  ondansetron (ZOFRAN) injection 4 mg (4 mg Intravenous Given 06/18/19 2041)  fentaNYL (SUBLIMAZE) injection 12.5 mcg (12.5 mcg Intravenous Given 06/18/19 2040)  sodium chloride 0.9 % bolus 1,000 mL (1,000 mLs Intravenous New Bag/Given 06/18/19 2018)     Initial Impression / Assessment and Plan / ED Course  I have reviewed the triage vital signs and the nursing notes.  Pertinent labs & imaging results that were available during my care of the patient were reviewed by me and considered in my medical decision making (see chart for details).    After the initial evaluation reviewed the patient's chart, including notes from her oncologist, and yesterday's CT, lab results. CT notable for demonstration of stool in the colon, no obvious mass, blockage. Concern for constipation contributing to her pain, patient will have enema, labs, fluids.     Initial labs notable for leukocytosis 24,000, up from 10,000 yesterday.  Much remains afebrile, with no hypotension. 9:56 PM On repeat exam patient is slightly improved, notes that her pain improved substantially, though it has returned, with moving around to urinate. Enema pending, urinalysis pending. With increased leukocytosis, urinalysis is necessary, as is enema, patient will require repeat evaluation, possible admission for pain control, though she improved substantially, remains afebrile and generally well-appearing, may be discharged to follow-up with her oncologist tomorrow.  PA Deborah Chalk is aware of the patient and her plan.  Final Clinical Impressions(s) / ED Diagnoses   Abdominal pain, generalized   Carmin Muskrat, MD 06/18/19 2158

## 2019-06-19 ENCOUNTER — Telehealth (HOSPITAL_COMMUNITY): Payer: Self-pay | Admitting: *Deleted

## 2019-06-19 LAB — URINALYSIS, ROUTINE W REFLEX MICROSCOPIC
Bilirubin Urine: NEGATIVE
Glucose, UA: NEGATIVE mg/dL
Hgb urine dipstick: NEGATIVE
Ketones, ur: 5 mg/dL — AB
Leukocytes,Ua: NEGATIVE
Nitrite: NEGATIVE
Protein, ur: NEGATIVE mg/dL
Specific Gravity, Urine: 1.013 (ref 1.005–1.030)
pH: 5 (ref 5.0–8.0)

## 2019-06-19 LAB — URINE CULTURE
Culture: 30000 — AB
Special Requests: NORMAL

## 2019-06-19 MED ORDER — ONDANSETRON HCL 4 MG/2ML IJ SOLN
4.0000 mg | Freq: Once | INTRAMUSCULAR | Status: AC
  Administered 2019-06-19: 02:00:00 4 mg via INTRAVENOUS
  Filled 2019-06-19: qty 2

## 2019-06-19 MED ORDER — OXYCODONE-ACETAMINOPHEN 5-325 MG PO TABS
1.0000 | ORAL_TABLET | Freq: Once | ORAL | Status: AC
  Administered 2019-06-19: 02:00:00 1 via ORAL
  Filled 2019-06-19: qty 1

## 2019-06-19 MED ORDER — HEPARIN SOD (PORK) LOCK FLUSH 100 UNIT/ML IV SOLN
500.0000 [IU] | Freq: Once | INTRAVENOUS | Status: AC
  Administered 2019-06-19: 05:00:00 500 [IU]
  Filled 2019-06-19: qty 5

## 2019-06-19 MED ORDER — OXYCODONE-ACETAMINOPHEN 5-325 MG PO TABS: 1.0000 | ORAL_TABLET | ORAL | 0 refills | Status: DC | PRN

## 2019-06-19 MED ORDER — METOCLOPRAMIDE HCL 10 MG PO TABS: 10.0000 mg | ORAL_TABLET | Freq: Four times a day (QID) | ORAL | 0 refills | Status: DC | PRN

## 2019-06-19 MED ORDER — HYDROMORPHONE HCL 1 MG/ML IJ SOLN
0.5000 mg | Freq: Once | INTRAMUSCULAR | Status: AC
  Administered 2019-06-19: 02:00:00 0.5 mg via INTRAVENOUS
  Filled 2019-06-19: qty 1

## 2019-06-19 MED ORDER — POLYETHYLENE GLYCOL 3350 17 GM/SCOOP PO POWD: 17.0000 g | Freq: Two times a day (BID) | ORAL | 0 refills | Status: DC

## 2019-06-19 NOTE — Discharge Instructions (Addendum)
Continue you prescribed Senna for constipation management. Add Miralax and take as prescribed. Use Reglan for nasuea and Percocet for severe pain. Use Percocet cautiously as this medication can make you drowsy and also worsening constipation. Follow up with your primary care doctor and oncology team.

## 2019-06-19 NOTE — Telephone Encounter (Signed)
Received phone call approximately 1030 from pt's daughter, Breda Bond.  She wanted to update [place] that her mother went to [facility] ED last night for severe constipation and abdominal/back pain.  Reports pt received Dilaudid injection for pain.  Also received an enema and was able to have a "really good" bowel movement.  States that her abdominal pain and nausea has greatly improved since having BM.  Pt was sent home on hydrocodone from the ED for her back pain, which is ongoing (she had been taking Aleve for it per daughter but states that stopped helping). [name] is concerned that her mother will not tolerate the hydrocodone well, as she is "very sensitive" to a lot of medications.  She thinks the hydrocodone will make Kimiyo more prone to nausea.  She is requesting that we send in an Rx for Dilaudid instead since she received it in the ED and tolerated it well.  Advised [name] that Dilaudid is quite a strong narcotic, and that we would prefer she try the hydrocodone first to see if it will control her back pain.  I instructed her to give Yittel her Compazine about 30 minutes prior to taking the hydrocodone to help prevent any nausea she may have from taking the hydrocodone.  She is agreeable to Aasiyah trying the hydrocodone with premedicating with the Compazine.  I also instructed [name] to make sure Nari continues to take her stool softener as I instructed them per the clinics constipation instruction sheet and she verbalizes understanding.  She also is attributing Sanna's increased back pain to the Fulphila injection received on 7/29.  Advised her to have Gretta try taking Claritin on the day she receives the injection and several days after to see if that will help.  She verbalizes understanding and is willing to try this.    [place] called to state that Maral was having shaking chills.  States Tyreisha had complained of being hot earlier, so she turned down the air to make it cooler in the  house.  She took Leatrice's temperature orally while we were on the phone. She states it was 97.3.  Notified K. Milinda Hirschfeld, NP of all of the above. She is in agreement with the instructions I gave regarding the pain medication and Claritin.  Advised to have [name] continue to monitor Karelly's temperature throughout the day, and if she should spike a fever go to the ED.  [name] was notified of this - she verbalizes understanding and will monitor temp throughout the day.  She was instructed to call the clinic with any additional questions or concerns.

## 2019-06-20 ENCOUNTER — Telehealth: Payer: Self-pay | Admitting: Emergency Medicine

## 2019-06-20 NOTE — Telephone Encounter (Signed)
Post ED Visit - Positive Culture Follow-up  Culture report reviewed by antimicrobial stewardship pharmacist: [name] []  [name], Pharm.D. []  [name], Pharm.D., BCPS AQ-ID []  [name], Pharm.D., BCPS []  [name], Pharm.D., BCPS []  [name], Pharm.D., BCPS, AAHIVP []  [name], Pharm.D., BCPS, AAHIVP []  [name], PharmD, BCPS []  [name], PharmD, BCPS [x]  [name], PharmD, BCPS []  [name], PharmD []  [name], PharmD, BCPS []  [name], PharmD  [name] []  [name], PharmD []  [name], PharmD []  [name], PharmD []  [name], Rph []  [name], PharmD []  [name], PharmD []  [name], PharmD []  [name], PharmD []  [name], PharmD []  [name], PharmD []  [name], PharmD []  [name], PharmD []  [name], PharmD   Positive urine culture No further patient follow-up is required at this time.  [name] 06/20/2019, 4:11 PM

## 2019-06-21 ENCOUNTER — Other Ambulatory Visit: Payer: Self-pay | Admitting: Cardiovascular Disease

## 2019-06-22 ENCOUNTER — Telehealth (HOSPITAL_COMMUNITY): Payer: Self-pay | Admitting: Hematology

## 2019-06-22 NOTE — Telephone Encounter (Signed)
Per the request of Renee N. Requested auth for [id number]/Neulasta pt had a reaction to [id number] on 7/27. Request is pending

## 2019-06-23 ENCOUNTER — Inpatient Hospital Stay (HOSPITAL_COMMUNITY): Payer: Medicare Other

## 2019-06-23 ENCOUNTER — Inpatient Hospital Stay (HOSPITAL_COMMUNITY): Payer: Medicare Other | Attending: Hematology

## 2019-06-23 ENCOUNTER — Other Ambulatory Visit: Payer: Self-pay

## 2019-06-23 ENCOUNTER — Encounter (HOSPITAL_COMMUNITY): Payer: Self-pay | Admitting: Hematology

## 2019-06-23 ENCOUNTER — Inpatient Hospital Stay (HOSPITAL_BASED_OUTPATIENT_CLINIC_OR_DEPARTMENT_OTHER): Payer: Medicare Other | Admitting: Hematology

## 2019-06-23 VITALS — BP 105/55 | HR 69 | Temp 97.5°F | Resp 18

## 2019-06-23 VITALS — BP 125/74 | HR 90 | Temp 97.1°F | Resp 16 | Wt 164.4 lb

## 2019-06-23 DIAGNOSIS — S32020A Wedge compression fracture of second lumbar vertebra, initial encounter for closed fracture: Secondary | ICD-10-CM

## 2019-06-23 DIAGNOSIS — C3491 Malignant neoplasm of unspecified part of right bronchus or lung: Secondary | ICD-10-CM | POA: Insufficient documentation

## 2019-06-23 DIAGNOSIS — R634 Abnormal weight loss: Secondary | ICD-10-CM | POA: Diagnosis not present

## 2019-06-23 DIAGNOSIS — R300 Dysuria: Secondary | ICD-10-CM | POA: Insufficient documentation

## 2019-06-23 DIAGNOSIS — G629 Polyneuropathy, unspecified: Secondary | ICD-10-CM | POA: Insufficient documentation

## 2019-06-23 DIAGNOSIS — E785 Hyperlipidemia, unspecified: Secondary | ICD-10-CM | POA: Insufficient documentation

## 2019-06-23 DIAGNOSIS — Z7982 Long term (current) use of aspirin: Secondary | ICD-10-CM | POA: Diagnosis not present

## 2019-06-23 DIAGNOSIS — Z5112 Encounter for antineoplastic immunotherapy: Secondary | ICD-10-CM | POA: Diagnosis not present

## 2019-06-23 DIAGNOSIS — C7889 Secondary malignant neoplasm of other digestive organs: Secondary | ICD-10-CM | POA: Diagnosis not present

## 2019-06-23 DIAGNOSIS — K5900 Constipation, unspecified: Secondary | ICD-10-CM | POA: Insufficient documentation

## 2019-06-23 DIAGNOSIS — M545 Low back pain: Secondary | ICD-10-CM | POA: Insufficient documentation

## 2019-06-23 DIAGNOSIS — Z5111 Encounter for antineoplastic chemotherapy: Secondary | ICD-10-CM | POA: Insufficient documentation

## 2019-06-23 DIAGNOSIS — C778 Secondary and unspecified malignant neoplasm of lymph nodes of multiple regions: Secondary | ICD-10-CM | POA: Insufficient documentation

## 2019-06-23 DIAGNOSIS — K219 Gastro-esophageal reflux disease without esophagitis: Secondary | ICD-10-CM | POA: Insufficient documentation

## 2019-06-23 DIAGNOSIS — C7951 Secondary malignant neoplasm of bone: Secondary | ICD-10-CM | POA: Diagnosis not present

## 2019-06-23 DIAGNOSIS — R531 Weakness: Secondary | ICD-10-CM | POA: Diagnosis not present

## 2019-06-23 DIAGNOSIS — C799 Secondary malignant neoplasm of unspecified site: Secondary | ICD-10-CM

## 2019-06-23 LAB — CBC WITH DIFFERENTIAL/PLATELET
Abs Immature Granulocytes: 0.29 10*3/uL — ABNORMAL HIGH (ref 0.00–0.07)
Basophils Absolute: 0 10*3/uL (ref 0.0–0.1)
Basophils Relative: 1 %
Eosinophils Absolute: 0.2 10*3/uL (ref 0.0–0.5)
Eosinophils Relative: 3 %
HCT: 36.7 % (ref 36.0–46.0)
Hemoglobin: 11.9 g/dL — ABNORMAL LOW (ref 12.0–15.0)
Immature Granulocytes: 4 %
Lymphocytes Relative: 20 %
Lymphs Abs: 1.5 10*3/uL (ref 0.7–4.0)
MCH: 28.5 pg (ref 26.0–34.0)
MCHC: 32.4 g/dL (ref 30.0–36.0)
MCV: 88 fL (ref 80.0–100.0)
Monocytes Absolute: 1.2 10*3/uL — ABNORMAL HIGH (ref 0.1–1.0)
Monocytes Relative: 16 %
Neutro Abs: 4.4 10*3/uL (ref 1.7–7.7)
Neutrophils Relative %: 56 %
Platelets: 178 10*3/uL (ref 150–400)
RBC: 4.17 MIL/uL (ref 3.87–5.11)
RDW: 13.9 % (ref 11.5–15.5)
WBC: 7.7 10*3/uL (ref 4.0–10.5)
nRBC: 0 % (ref 0.0–0.2)

## 2019-06-23 LAB — COMPREHENSIVE METABOLIC PANEL
ALT: 16 U/L (ref 0–44)
AST: 25 U/L (ref 15–41)
Albumin: 3.3 g/dL — ABNORMAL LOW (ref 3.5–5.0)
Alkaline Phosphatase: 95 U/L (ref 38–126)
Anion gap: 12 (ref 5–15)
BUN: 14 mg/dL (ref 8–23)
CO2: 22 mmol/L (ref 22–32)
Calcium: 9 mg/dL (ref 8.9–10.3)
Chloride: 94 mmol/L — ABNORMAL LOW (ref 98–111)
Creatinine, Ser: 0.79 mg/dL (ref 0.44–1.00)
GFR calc Af Amer: >60 mL/min (ref >60)
GFR calc non Af Amer: >60 mL/min (ref >60)
Glucose, Bld: 118 mg/dL — ABNORMAL HIGH (ref 70–99)
Potassium: 3.8 mmol/L (ref 3.5–5.1)
Sodium: 128 mmol/L — ABNORMAL LOW (ref 135–145)
Total Bilirubin: 0.5 mg/dL (ref 0.3–1.2)
Total Protein: 6.4 g/dL — ABNORMAL LOW (ref 6.5–8.1)

## 2019-06-23 LAB — TSH: TSH: 4.028 u[IU]/mL (ref 0.350–4.500)

## 2019-06-23 MED ORDER — HYDROMORPHONE HCL 2 MG PO TABS: 1.0000 mg | ORAL_TABLET | Freq: Four times a day (QID) | ORAL | 0 refills | Status: DC | PRN

## 2019-06-23 MED ORDER — SODIUM CHLORIDE 0.9% FLUSH
10.0000 mL | Freq: Once | INTRAVENOUS | Status: AC | PRN
  Administered 2019-06-23: 10 mL

## 2019-06-23 MED ORDER — HEPARIN SOD (PORK) LOCK FLUSH 100 UNIT/ML IV SOLN
500.0000 [IU] | Freq: Once | INTRAVENOUS | Status: AC | PRN
  Administered 2019-06-23: 15:00:00 500 [IU]

## 2019-06-23 MED ORDER — SODIUM CHLORIDE 0.9 % IV SOLN
Freq: Once | INTRAVENOUS | Status: AC
  Administered 2019-06-23: 12:00:00 via INTRAVENOUS
  Filled 2019-06-23: qty 10

## 2019-06-23 NOTE — Patient Instructions (Signed)
[place] at [facility] Discharge Instructions Received IV hydration today. Follow-up as scheduled. Call clinic for any questions or concerns   Thank you for choosing [place] at [facility] to provide your oncology and hematology care.  To afford each patient quality time with our provider, please arrive at least 15 minutes before your scheduled appointment time.   If you have a lab appointment with the [facility] please come in thru the  Main Entrance and check in at the main information desk  You need to re-schedule your appointment should you arrive 10 or more minutes late.  We strive to give you quality time with our providers, and arriving late affects you and other patients whose appointments are after yours.  Also, if you no show three or more times for appointments you may be dismissed from the clinic at the providers discretion.     Again, thank you for choosing [facility].  Our hope is that these requests will decrease the amount of time that you wait before being seen by our physicians.       _____________________________________________________________  Should you have questions after your visit to [facility], please contact our office at [phone] between the hours of 8:00 a.m. and 4:30 p.m.  Voicemails left after 4:00 p.m. will not be returned until the following business day.  For prescription refill requests, have your pharmacy contact our office and allow 72 hours.    Cancer Center Support Programs:   > Cancer Support Group  2nd Tuesday of the month 1pm-2pm, Journey Room

## 2019-06-23 NOTE — Assessment & Plan Note (Signed)
1.  Metastatic poorly differentiated squamous cell carcinoma of the lung: -PDL 1 TPS-10%, foundation 1 test results pending.  Presentation with low back pain. - PET scan on 05/08/2019 shows hypermetabolic right hilar, mediastinal, right supraclavicular adenopathy with solitary small hypermetabolic nodule in the right lung measuring 1 cm.  Focal activity in the right posterior oropharynx with no CT correlate.  Widespread hypermetabolic skeletal metastasis and solitary splenic metastasis.  MRI of the brain negative. -Right supraclavicular lymph node biopsy on 05/20/2019 shows metastatic poorly differentiated squamous cell carcinoma, positive for pankeratin, P 16, CK 7, CK 5/6. - Carboplatin, paclitaxel and pembrolizumab cycle 1 on 06/15/2019. - She had allergic reaction to fulfila.  She went to the ER with hypotension. -According to her daughter, she started feeling very weak after the growth factor injection. - We have reviewed her labs.  I will consider dose reduction at next cycle.  I will also switch growth factor to Neulasta.  2.  Weight loss: -She had about 6 pound weight loss in the last 1 week.  She is only able to eat soups and oatmeal.  Appetite is slowly picking up. - She cannot drink any milk products.  We have suggested her to drink Ensure breeze. - She has nausea for which Compazine every 6 hours helps.  She is taking omeprazole for heartburn.  She still has it.  I have suggested her to take Maalox 30 mL as needed.  3.  Low back pain: - She complains of pain in the lower back, radiating down the back of thighs. -She is taking Aleve 200 mg 3 times a day.  She reportedly received Dilaudid in the ER which helped.  She is sensitive to multiple narcotics including tramadol, oxycodone. -I will give a prescription for Dilaudid 2 mg half tablet to be taken every 6-8 hours as needed. -She was told to take MiraLAX every night.

## 2019-06-23 NOTE — Progress Notes (Signed)
[place], [place] [address]   CLINIC:  Medical Oncology/Hematology  PCP:  [name], MD [address] [place] [address] [phone]   REASON FOR VISIT: Metastatic squamous cell lung cancer.   INTERVAL HISTORY:  Lindsey Mullins 73 y.o. female seen for follow-up of metastatic squamous cell lung cancer.  States she received first cycle of chemotherapy on 06/15/2019.  She came back for injection of the growth factor.  She ended up in the ER after receiving the growth factor with hypotension and tachycardia.  She reported severe loss of energy after the growth factor was given.  She had nausea which is controlled with Compazine every 6 hours.  She also has heartburn despite taking omeprazole.  She reports body aches mainly in the lower back with pain shooting down her back of thighs.  She is taking Aleve 200 mg 3 times a day as needed.  She could not take any powerful narcotics.  She also complains of severe tiredness.  She lost about 6 pounds in the last 1 week.  She is eating soups and oatmeal.  She went to the ER on 06/17/2019 when she had reaction to the growth factor.  Urine culture showed 30,000 E. coli.  Appetite is 50%.  Energy levels are low.   REVIEW OF SYSTEMS:  Review of Systems  Constitutional: Positive for fatigue.  Gastrointestinal: Positive for constipation and nausea.  Musculoskeletal: Positive for back pain.  All other systems reviewed and are negative.    PAST MEDICAL/SURGICAL HISTORY:  Past Medical History:  Diagnosis Date  . Atypical chest pain 05/23/2012   STRESS TEST - small to moderate sized area of partial reversibility of the anteroapical wall, most likely breast artifact, post-stress EF 67%, EKG show NSR at 65, No Lexiscan EKG changes, non-diagnostic for ischemia; STRESS TEST, 01/25/2010 - normal study, post-stress EF 66%, no significant ischemia  . Cancer ([facility])   . Cerebral atherosclerosis 09/29/2012   CAROTID DUPLEX - RIGHT  BULB/PROXIMAL ICA-moderate amount of fibrous plaque, 50-69% diameter reduction; LEFT CEA-normal, no significant diameter reduction  . Colitis   . GERD (gastroesophageal reflux disease)   . Hyperlipidemia   . Hypertension   . Osteopenia   . Restless leg syndrome   . Shortness of breath 03/30/2005   2D ECHO - EF >55%, normal  . TIA (transient ischemic attack) 01/27/2010   2D ECHO - EF 65%, normal   Past Surgical History:  Procedure Laterality Date  . ABDOMINAL EXPLORATION SURGERY    . CAROTID ENDARTERECTOMY    . LYMPH NODE BIOPSY N/A 05/20/2019   Procedure: LYMPH NODE BIOPSY CERVICAL;  Surgeon: Aviva Signs, MD;  Location: [facility];  Service: General;  Laterality: N/A;  . Peripheral Vascular Catheterization  07/16/2005   Right verterbral-50% smooth segmental badsilar artery stenosis on right side, Right carotid-50% ulcerated proxmial stenosis, Left carotid-60 to 70% left externam carotid artery stenosis, 90% proximal left ICA stenosis  . PORTACATH PLACEMENT Left 06/12/2019   Procedure: INSERTION PORT-A-CATH (catheter attached left subclavian);  Surgeon: Aviva Signs, MD;  Location: [facility];  Service: General;  Laterality: Left;     SOCIAL HISTORY:  Social History   Socioeconomic History  . Marital status: Divorced    Spouse name: Not on file  . Number of children: 2  . Years of education: Not on file  . Highest education level: Not on file  Occupational History  . Not on file  Social  Needs  . Financial resource strain: Not hard at all  . Food insecurity    Worry: Never true    Inability: Never true  . Transportation needs    Medical: No    Non-medical: No  Tobacco Use  . Smoking status: Former Smoker    Packs/day: 0.25    Years: 10.00    Pack years: 2.50    Types: Cigarettes    Quit date: 04/21/1983    Years since quitting: 36.1  . Smokeless tobacco: Never Used  Substance and Sexual Activity  . Alcohol use: No  . Drug use: No  . Sexual activity:  Not Currently  Lifestyle  . Physical activity    Days per week: 0 days    Minutes per session: 0 min  . Stress: Not at all  Relationships  . Social Herbalist on phone: Twice a week    Gets together: Twice a week    Attends religious service: More than 4 times per year    Active member of club or organization: No    Attends meetings of clubs or organizations: Never    Relationship status: Divorced  . Intimate partner violence    Fear of current or ex partner: No    Emotionally abused: No    Physically abused: No    Forced sexual activity: No  Other Topics Concern  . Not on file  Social History Narrative  . Not on file    FAMILY HISTORY:  Family History  Problem Relation Age of Onset  . Heart disease Mother   . Hypertension Mother   . Hypertension Father   . Heart disease Father     CURRENT MEDICATIONS:  Outpatient Encounter Medications as of 06/23/2019  Medication Sig Note  . aspirin 81 MG tablet Take 81 mg by mouth daily.    . cyclobenzaprine (FLEXERIL) 5 MG tablet TAKE 1 TABLET BY MOUTH AT BEDTIME AS NEEDED FOR MUSCLE SPASM OR  LEG  CRAMPS (Patient taking differently: Take 5 mg by mouth at bedtime as needed for muscle spasms. )   . lidocaine-prilocaine (EMLA) cream Apply 1 application topically as needed. Apply small amount to port a cath site and cover with plastic wrap one hour prior to chemotherapy appointments   . loratadine (CLARITIN) 10 MG tablet Take 10 mg by mouth daily as needed for allergies or itching.    . losartan (COZAAR) 50 MG tablet Take 1/2 (one-half) tablet by mouth once daily   . metoCLOPramide (REGLAN) 10 MG tablet Take 1 tablet (10 mg total) by mouth every 6 (six) hours as needed for nausea or vomiting.   . naproxen sodium (ALEVE) 220 MG tablet Take 220 mg by mouth 2 (two) times daily as needed (pain).    . Omega-3 Krill Oil 500 MG CAPS Take 500 mg by mouth daily.    [name] omeprazole (PRILOSEC) 20 MG capsule Take 1 po daily (Patient taking  differently: Take 20 mg by mouth daily as needed (acid reflux). )   . polyethylene glycol powder (GLYCOLAX/MIRALAX) 17 GM/SCOOP powder Take 17 g by mouth 2 (two) times daily. Until daily soft stools  OTC   . prochlorperazine (COMPAZINE) 10 MG tablet Take 1 tablet (10 mg total) by mouth every 6 (six) hours as needed for nausea or vomiting.   . rosuvastatin (CRESTOR) 20 MG tablet Take 1 tablet (20 mg total) by mouth daily. (Patient taking differently: Take 10 mg by mouth 2 (two) times daily as needed (cholesterol). )   .  tiZANidine (ZANAFLEX) 4 MG tablet Take 4 mg by mouth daily as needed for muscle spasms.    . carvedilol (COREG) 3.125 MG tablet TAKE 1 TABLET BY MOUTH ONCE DAILY (Patient taking differently: Take 3.125 mg by mouth daily. )   . HYDROmorphone (DILAUDID) 2 MG tablet Take 0.5 tablets (1 mg total) by mouth every 6 (six) hours as needed for severe pain.   . Lactulose 20 GM/30ML SOLN Lactulose 10gm.  Take 28ml to 27ml three times a day to assist with moving bowels.  Titrate down if having multiple bowel movements.  Call if no results after taking the medication three times a day. (Patient not taking: Reported on 06/23/2019) 06/18/2019: Haven't started yet  . nitroGLYCERIN (NITROSTAT) 0.4 MG SL tablet DISSOLVE ONE TABLET UNDER THE TONGUE AS NEEDED FOR CHEST PAIN**MAX 3 TABLETS EACH 5 MINUTES APART** (Patient not taking: Reported on 06/23/2019)   . oxyCODONE-acetaminophen (PERCOCET/ROXICET) 5-325 MG tablet Take 1 tablet by mouth every 4 (four) hours as needed for severe pain. (Patient not taking: Reported on 06/23/2019)   . senna (SENOKOT) 8.6 MG TABS tablet Take 2 tablets by mouth daily.   . traMADol (ULTRAM) 50 MG tablet Take 1 tablet (50 mg total) by mouth every 6 (six) hours as needed. (Patient not taking: Reported on 06/23/2019)   . [DISCONTINUED] lidocaine (LIDODERM) 5 % Place 1 patch onto the skin daily. Remove & Discard patch within 12 hours or as directed by MD (Patient not taking: Reported on  06/18/2019)    No facility-administered encounter medications on file as of 06/23/2019.     ALLERGIES:  Allergies  Allergen Reactions  . Penicillins Shortness Of Breath and Other (See Comments)    Has patient had a PCN reaction causing immediate rash, facial/tongue/throat swelling, SOB or lightheadedness with hypotension: Yes Has patient had a PCN reaction causing severe rash involving mucus membranes or skin necrosis: Unk Has patient had a PCN reaction that required hospitalization: Unk Has patient had a PCN reaction occurring within the last 10 years: Yes "States that she was in ICU after being administered"   . Codeine Nausea And Vomiting  . Lipitor [Atorvastatin] Other (See Comments)    Myalgias  . Lisinopril Cough  . Pravastatin Other (See Comments)    Myalgias     PHYSICAL EXAM:  ECOG Performance status: 1  Vitals:   06/23/19 1000  BP: 125/74  Pulse: 90  Resp: 16  Temp: (!) 97.1 F (36.2 C)  SpO2: 99%   Filed Weights   06/23/19 1000  Weight: 164 lb 6 oz (74.6 kg)    Physical Exam Constitutional:      Appearance: Normal appearance. She is normal weight.  Cardiovascular:     Rate and Rhythm: Normal rate and regular rhythm.     Heart sounds: Normal heart sounds.  Pulmonary:     Effort: Pulmonary effort is normal.     Breath sounds: Normal breath sounds.  Abdominal:     General: Bowel sounds are normal.     Palpations: Abdomen is soft.  Musculoskeletal: Normal range of motion.  Skin:    General: Skin is warm and dry.  Neurological:     Mental Status: She is alert and oriented to person, place, and time. Mental status is at baseline.  Psychiatric:        Mood and Affect: Mood normal.        Behavior: Behavior normal.        Thought Content: Thought content normal.  Judgment: Judgment normal.      LABORATORY DATA:  I have reviewed the labs as listed.  CBC    Component Value Date/Time   WBC 7.7 06/23/2019 1028   RBC 4.17 06/23/2019 1028    HGB 11.9 (L) 06/23/2019 1028   HCT 36.7 06/23/2019 1028   PLT 178 06/23/2019 1028   MCV 88.0 06/23/2019 1028   MCH 28.5 06/23/2019 1028   MCHC 32.4 06/23/2019 1028   RDW 13.9 06/23/2019 1028   LYMPHSABS 1.5 06/23/2019 1028   MONOABS 1.2 (H) 06/23/2019 1028   EOSABS 0.2 06/23/2019 1028   BASOSABS 0.0 06/23/2019 1028   CMP Latest Ref Rng & Units 06/23/2019 06/18/2019 06/17/2019  Glucose 70 - 99 mg/dL 118(H) 87 104(H)  BUN 8 - 23 mg/dL 14 19 20   Creatinine 0.44 - 1.00 mg/dL 0.79 0.50 0.68  Sodium 135 - 145 mmol/L 128(L) 131(L) 135  Potassium 3.5 - 5.1 mmol/L 3.8 3.5 3.4(L)  Chloride 98 - 111 mmol/L 94(L) 96(L) 104  CO2 22 - 32 mmol/L 22 24 22   Calcium 8.9 - 10.3 mg/dL 9.0 8.8(L) 8.4(L)  Total Protein 6.5 - 8.1 g/dL 6.4(L) 7.1 6.6  Total Bilirubin 0.3 - 1.2 mg/dL 0.5 1.6(H) 0.7  Alkaline Phos 38 - 126 U/L 95 94 85  AST 15 - 41 U/L 25 35 36  ALT 0 - 44 U/L 16 25 26        DIAGNOSTIC IMAGING:  I have independently reviewed the scans and discussed with the patient.   ASSESSMENT & PLAN:   Squamous cell carcinoma of lung, stage IV, right ([place]) 1.  Metastatic poorly differentiated squamous cell carcinoma of the lung: -PDL 1 TPS-10%, foundation 1 test results pending.  Presentation with low back pain. - PET scan on 05/08/2019 shows hypermetabolic right hilar, mediastinal, right supraclavicular adenopathy with solitary small hypermetabolic nodule in the right lung measuring 1 cm.  Focal activity in the right posterior oropharynx with no CT correlate.  Widespread hypermetabolic skeletal metastasis and solitary splenic metastasis.  MRI of the brain negative. -Right supraclavicular lymph node biopsy on 05/20/2019 shows metastatic poorly differentiated squamous cell carcinoma, positive for pankeratin, P 16, CK 7, CK 5/6. - Carboplatin, paclitaxel and pembrolizumab cycle 1 on 06/15/2019. - She had allergic reaction to fulfila.  She went to the ER with hypotension. -According to her daughter, she  started feeling very weak after the growth factor injection. - We have reviewed her labs.  I will consider dose reduction at next cycle.  I will also switch growth factor to Neulasta.  2.  Weight loss: -She had about 6 pound weight loss in the last 1 week.  She is only able to eat soups and oatmeal.  Appetite is slowly picking up. - She cannot drink any milk products.  We have suggested her to drink Ensure breeze. - She has nausea for which Compazine every 6 hours helps.  She is taking omeprazole for heartburn.  She still has it.  I have suggested her to take Maalox 30 mL as needed.  3.  Low back pain: - She complains of pain in the lower back, radiating down the back of thighs. -She is taking Aleve 200 mg 3 times a day.  She reportedly received Dilaudid in the ER which helped.  She is sensitive to multiple narcotics including tramadol, oxycodone. -I will give a prescription for Dilaudid 2 mg half tablet to be taken every 6-8 hours as needed. -She was told to take MiraLAX every night.  Total time spent is 40 minutes with more than 50% of the time spent face-to-face discussing treatment plan, management of side effects, counseling and coordination of care.  Orders placed this encounter:  No orders of the defined types were placed in this encounter.     Derek Jack, MD [place] [phone]

## 2019-06-23 NOTE — Progress Notes (Signed)
Lesle Chris Holsapple tolerated IV hydration with magnesium and potassium well without complaints or incident. VSS upon discharge. Pt discharged via wheelchair in satisfactory condition

## 2019-06-23 NOTE — Patient Instructions (Signed)
[facility] Discharge Instructions  You were seen today by Dr. Katragadda. He went over your recent lab results. He will see you back in 1 week for labs and follow up.   Thank you for choosing [facility] at [facility] to provide your oncology and hematology care.  To afford each patient quality time with our provider, please arrive at least 15 minutes before your scheduled appointment time.   If you have a lab appointment with the Cancer Center please come in thru the  Main Entrance and check in at the main information desk  You need to re-schedule your appointment should you arrive 10 or more minutes late.  We strive to give you quality time with our providers, and arriving late affects you and other patients whose appointments are after yours.  Also, if you no show three or more times for appointments you may be dismissed from the clinic at the providers discretion.     Again, thank you for choosing [facility].  Our hope is that these requests will decrease the amount of time that you wait before being seen by our physicians.       _____________________________________________________________  Should you have questions after your visit to [facility], please contact our office at [phone] between the hours of 8:00 a.m. and 4:30 p.m.  Voicemails left after 4:00 p.m. will not be returned until the following business day.  For prescription refill requests, have your pharmacy contact our office and allow 72 hours.    Cancer Center Support Programs:   > Cancer Support Group  2nd Tuesday of the month 1pm-2pm, Journey Room    

## 2019-06-30 ENCOUNTER — Encounter: Payer: Self-pay | Admitting: Physical Medicine and Rehabilitation

## 2019-06-30 ENCOUNTER — Ambulatory Visit (INDEPENDENT_AMBULATORY_CARE_PROVIDER_SITE_OTHER): Payer: Medicare Other | Admitting: Physical Medicine and Rehabilitation

## 2019-06-30 ENCOUNTER — Other Ambulatory Visit (HOSPITAL_COMMUNITY): Payer: Self-pay | Admitting: *Deleted

## 2019-06-30 ENCOUNTER — Ambulatory Visit: Payer: Self-pay

## 2019-06-30 VITALS — BP 136/82 | HR 75

## 2019-06-30 DIAGNOSIS — R309 Painful micturition, unspecified: Secondary | ICD-10-CM

## 2019-06-30 DIAGNOSIS — M47816 Spondylosis without myelopathy or radiculopathy, lumbar region: Secondary | ICD-10-CM | POA: Diagnosis not present

## 2019-06-30 MED ORDER — METHYLPREDNISOLONE ACETATE 80 MG/ML IJ SUSP
80.0000 mg | Freq: Once | INTRAMUSCULAR | Status: AC
  Administered 2019-06-30: 80 mg

## 2019-06-30 NOTE — Progress Notes (Signed)
.  Numeric Pain Rating Scale and Functional Assessment Average Pain 7   In the last MONTH (on 0-10 scale) has pain interfered with the following?  1. General activity like being  able to carry out your everyday physical activities such as walking, climbing stairs, carrying groceries, or moving a chair?  Rating(7)   +Driver, -BT, -Dye Allergies.   

## 2019-06-30 NOTE — Progress Notes (Signed)
Orders placed for urinalysis and culture because patient called reporting burning with urination for the past few days.  Orders linked to her lab appointment tomorrow.

## 2019-07-01 ENCOUNTER — Inpatient Hospital Stay (HOSPITAL_COMMUNITY): Payer: Medicare Other

## 2019-07-01 ENCOUNTER — Encounter (HOSPITAL_COMMUNITY): Payer: Self-pay | Admitting: Hematology

## 2019-07-01 ENCOUNTER — Other Ambulatory Visit: Payer: Self-pay

## 2019-07-01 ENCOUNTER — Inpatient Hospital Stay (HOSPITAL_BASED_OUTPATIENT_CLINIC_OR_DEPARTMENT_OTHER): Payer: Medicare Other | Admitting: Hematology

## 2019-07-01 VITALS — BP 121/63 | HR 74 | Temp 97.1°F | Resp 18 | Wt 167.5 lb

## 2019-07-01 DIAGNOSIS — C3491 Malignant neoplasm of unspecified part of right bronchus or lung: Secondary | ICD-10-CM | POA: Diagnosis not present

## 2019-07-01 DIAGNOSIS — R309 Painful micturition, unspecified: Secondary | ICD-10-CM

## 2019-07-01 DIAGNOSIS — Z5112 Encounter for antineoplastic immunotherapy: Secondary | ICD-10-CM

## 2019-07-01 DIAGNOSIS — C799 Secondary malignant neoplasm of unspecified site: Secondary | ICD-10-CM

## 2019-07-01 LAB — CBC WITH DIFFERENTIAL/PLATELET
Abs Immature Granulocytes: 1.12 10*3/uL — ABNORMAL HIGH (ref 0.00–0.07)
Basophils Absolute: 0.1 10*3/uL (ref 0.0–0.1)
Basophils Relative: 0 %
Eosinophils Absolute: 0 10*3/uL (ref 0.0–0.5)
Eosinophils Relative: 0 %
HCT: 33.1 % — ABNORMAL LOW (ref 36.0–46.0)
Hemoglobin: 10.5 g/dL — ABNORMAL LOW (ref 12.0–15.0)
Immature Granulocytes: 4 %
Lymphocytes Relative: 7 %
Lymphs Abs: 2.3 10*3/uL (ref 0.7–4.0)
MCH: 28.7 pg (ref 26.0–34.0)
MCHC: 31.7 g/dL (ref 30.0–36.0)
MCV: 90.4 fL (ref 80.0–100.0)
Monocytes Absolute: 1.7 10*3/uL — ABNORMAL HIGH (ref 0.1–1.0)
Monocytes Relative: 6 %
Neutro Abs: 25.8 10*3/uL — ABNORMAL HIGH (ref 1.7–7.7)
Neutrophils Relative %: 83 %
Platelets: 172 10*3/uL (ref 150–400)
RBC: 3.66 MIL/uL — ABNORMAL LOW (ref 3.87–5.11)
RDW: 14.7 % (ref 11.5–15.5)
WBC: 31 10*3/uL — ABNORMAL HIGH (ref 4.0–10.5)
nRBC: 0 % (ref 0.0–0.2)

## 2019-07-01 LAB — COMPREHENSIVE METABOLIC PANEL
ALT: 21 U/L (ref 0–44)
AST: 29 U/L (ref 15–41)
Albumin: 3.6 g/dL (ref 3.5–5.0)
Alkaline Phosphatase: 180 U/L — ABNORMAL HIGH (ref 38–126)
Anion gap: 11 (ref 5–15)
BUN: 13 mg/dL (ref 8–23)
CO2: 23 mmol/L (ref 22–32)
Calcium: 9 mg/dL (ref 8.9–10.3)
Chloride: 101 mmol/L (ref 98–111)
Creatinine, Ser: 0.73 mg/dL (ref 0.44–1.00)
GFR calc Af Amer: >60 mL/min (ref >60)
GFR calc non Af Amer: >60 mL/min (ref >60)
Glucose, Bld: 125 mg/dL — ABNORMAL HIGH (ref 70–99)
Potassium: 3.5 mmol/L (ref 3.5–5.1)
Sodium: 135 mmol/L (ref 135–145)
Total Bilirubin: 0.4 mg/dL (ref 0.3–1.2)
Total Protein: 6.9 g/dL (ref 6.5–8.1)

## 2019-07-01 LAB — URINALYSIS, ROUTINE W REFLEX MICROSCOPIC
Bilirubin Urine: NEGATIVE
Glucose, UA: NEGATIVE mg/dL
Ketones, ur: NEGATIVE mg/dL
Nitrite: NEGATIVE
Protein, ur: 30 mg/dL — AB
Renal Epithelial: 26
Specific Gravity, Urine: 1.003 — ABNORMAL LOW (ref 1.005–1.030)
WBC, UA: >50 WBC/hpf — ABNORMAL HIGH (ref 0–5)
pH: 6 (ref 5.0–8.0)

## 2019-07-01 LAB — TSH: TSH: 0.669 u[IU]/mL (ref 0.350–4.500)

## 2019-07-01 MED ORDER — GABAPENTIN 100 MG PO CAPS: 100.0000 mg | ORAL_CAPSULE | Freq: Every day | ORAL | 0 refills | Status: DC

## 2019-07-01 NOTE — Assessment & Plan Note (Signed)
1.  Metastatic poorly differentiated squamous cell carcinoma of the lung: -PDL 1 TPS-10%, foundation 1 test results pending.  Presentation with low back pain. -PET scan on 05/08/2019 shows hypermetabolic right hilar, mediastinal, right supraclavicular adenopathy with solitary small hypermetabolic nodule in the right lung measuring 1 cm.  Focal activity in the right posterior oropharynx with no CT correlate.  Widespread hypermetabolic skeletal metastasis and solitary splenic metastasis.  MRI of the brain negative. -Right supraclavicular lymph node biopsy on 05/20/2019 shows metastatic poorly differentiated squamous cell carcinoma. -Cycle 1 of carboplatin, paclitaxel and pembrolizumab on 06/15/2019. -She had an allergic reaction to [facility].  We will switch it to Neulasta with next cycle. - She started feeling better about 5 days back. -We reviewed her blood work which is within normal limits.  She is complaining of burning sensation after urination.  Occasional blood was seen.  We will check a UA today.  If she has infection, will treated with Cipro twice daily for 5 days. -She will come back next week for her cycle 2.  I will consider decreasing the dose of carboplatin and paclitaxel during cycle 2.  2.  Weight loss: -She lost about 6 pounds in the first week after treatment.  She is able to eat better at this time.  She gained 3 pounds back. - She has not had a bowel movement since Saturday.  She is taking half dose of MiraLAX.  She was told to increase it to full dose.  She was also told to take stool softener with it.  3.  Low back pain: - She had another injection done on 06/30/2019 in the back. -At last visit I have given Dilaudid 2 mg half tablet to be taken every 6-8 hours.  She has not started taking it yet.  4.  Neuropathy: -She reports pins-and-needles sensation in both her feet for the last few days.  She never had this sensation. -Likely from paclitaxel.  We will start her on gabapentin 100  mg at bedtime.  We talked about side effects in detail.

## 2019-07-01 NOTE — Progress Notes (Signed)
[place], [place] [address]   CLINIC:  Medical Oncology/Hematology  PCP:  [name], MD [address] [place] [address] [phone]   REASON FOR VISIT: Metastatic squamous cell lung cancer.   INTERVAL HISTORY:  [name] 73 y.o. female seen for follow-up of metastatic lung cancer.  Cycle 1 of chemotherapy was on 06/15/2019, 2 weeks ago.  Since the last 1 week, she has been feeling better in terms of energy.  She does report burning sensation and occasional blood in the urine for the last few days.  She also reports constipation and her last bowel movement was 3 to 4 days ago.  She reported pins-and-needles sensation in both feet started few days ago.  She had a back injection done yesterday for pain.  Denies any fevers or chills.  Appetite is coming back.  His taste is better.  No nausea or vomiting reported.  REVIEW OF SYSTEMS:  Review of Systems  Gastrointestinal: Positive for constipation.  Genitourinary: Positive for dysuria.   Musculoskeletal: Positive for back pain.  Neurological: Positive for numbness.  All other systems reviewed and are negative.    PAST MEDICAL/SURGICAL HISTORY:  Past Medical History:  Diagnosis Date  . Atypical chest pain 05/23/2012   STRESS TEST - small to moderate sized area of partial reversibility of the anteroapical wall, most likely breast artifact, post-stress EF 67%, EKG show NSR at 65, No Lexiscan EKG changes, non-diagnostic for ischemia; STRESS TEST, 01/25/2010 - normal study, post-stress EF 66%, no significant ischemia  . Cancer ([place])   . Cerebral atherosclerosis 09/29/2012   CAROTID DUPLEX - RIGHT  BULB/PROXIMAL ICA-moderate amount of fibrous plaque, 50-69% diameter reduction; LEFT CEA-normal, no significant diameter reduction  . Colitis   . GERD (gastroesophageal reflux disease)   . Hyperlipidemia   . Hypertension   . Osteopenia   . Restless leg syndrome   . Shortness of  breath 03/30/2005   2D ECHO - EF >55%, normal  . TIA (transient ischemic attack) 01/27/2010   2D ECHO - EF 65%, normal   Past Surgical History:  Procedure Laterality Date  . ABDOMINAL EXPLORATION SURGERY    . CAROTID ENDARTERECTOMY    . LYMPH NODE BIOPSY N/A 05/20/2019   Procedure: LYMPH NODE BIOPSY CERVICAL;  Surgeon: Aviva Signs, MD;  Location: [facility];  Service: General;  Laterality: N/A;  . Peripheral Vascular Catheterization  07/16/2005   Right verterbral-50% smooth segmental badsilar artery stenosis on right side, Right carotid-50% ulcerated proxmial stenosis, Left carotid-60 to 70% left externam carotid artery stenosis, 90% proximal left ICA stenosis  . PORTACATH PLACEMENT Left 06/12/2019   Procedure: INSERTION PORT-A-CATH (catheter attached left subclavian);  Surgeon: Aviva Signs, MD;  Location: [facility];  Service: General;  Laterality: Left;     SOCIAL HISTORY:  Social History   Socioeconomic History  . Marital status: Divorced    Spouse name: Not on file  . Number of children: 2  . Years of education: Not on file  . Highest education level: Not on file  Occupational History  . Not on file  Social Needs  . Financial resource strain: Not hard at all  . Food insecurity    Worry: Never true    Inability: Never true  . Transportation needs    Medical: No    Non-medical: No  Tobacco Use  . Smoking status: Former Smoker    Packs/day: 0.25    Years: 10.00  Pack years: 2.50    Types: Cigarettes    Quit date: 04/21/1983    Years since quitting: 36.2  . Smokeless tobacco: Never Used  Substance and Sexual Activity  . Alcohol use: No  . Drug use: No  . Sexual activity: Not Currently  Lifestyle  . Physical activity    Days per week: 0 days    Minutes per session: 0 min  . Stress: Not at all  Relationships  . Social Herbalist on phone: Twice a week    Gets together: Twice a week    Attends religious service: More than 4 times per year    Active member of  club or organization: No    Attends meetings of clubs or organizations: Never    Relationship status: Divorced  . Intimate partner violence    Fear of current or ex partner: No    Emotionally abused: No    Physically abused: No    Forced sexual activity: No  Other Topics Concern  . Not on file  Social History Narrative  . Not on file    FAMILY HISTORY:  Family History  Problem Relation Age of Onset  . Heart disease Mother   . Hypertension Mother   . Hypertension Father   . Heart disease Father     CURRENT MEDICATIONS:  Outpatient Encounter Medications as of 07/01/2019  Medication Sig Note  . aspirin 81 MG tablet Take 81 mg by mouth daily.    . carvedilol (COREG) 3.125 MG tablet TAKE 1 TABLET BY MOUTH ONCE DAILY (Patient taking differently: Take 3.125 mg by mouth daily. )   . cyclobenzaprine (FLEXERIL) 5 MG tablet TAKE 1 TABLET BY MOUTH AT BEDTIME AS NEEDED FOR MUSCLE SPASM OR  LEG  CRAMPS (Patient taking differently: Take 5 mg by mouth at bedtime as needed for muscle spasms. )   . HYDROmorphone (DILAUDID) 2 MG tablet Take 0.5 tablets (1 mg total) by mouth every 6 (six) hours as needed for severe pain.   . Lactulose 20 GM/30ML SOLN Lactulose 10gm.  Take 69ml to 23ml three times a day to assist with moving bowels.  Titrate down if having multiple bowel movements.  Call if no results after taking the medication three times a day. 06/18/2019: Haven't started yet  . lidocaine-prilocaine (EMLA) cream Apply 1 application topically as needed. Apply small amount to port a cath site and cover with plastic wrap one hour prior to chemotherapy appointments   . loratadine (CLARITIN) 10 MG tablet Take 10 mg by mouth daily as needed for allergies or itching.    . losartan (COZAAR) 50 MG tablet Take 1/2 (one-half) tablet by mouth once daily   . metoCLOPramide (REGLAN) 10 MG tablet Take 1 tablet (10 mg total) by mouth every 6 (six) hours as needed for nausea or vomiting.   . naproxen sodium (ALEVE)  220 MG tablet Take 220 mg by mouth 2 (two) times daily as needed (pain).    . nitroGLYCERIN (NITROSTAT) 0.4 MG SL tablet DISSOLVE ONE TABLET UNDER THE TONGUE AS NEEDED FOR CHEST PAIN**MAX 3 TABLETS EACH 5 MINUTES APART**   . Omega-3 Krill Oil 500 MG CAPS Take 500 mg by mouth daily.    [name] omeprazole (PRILOSEC) 20 MG capsule Take 1 po daily (Patient taking differently: Take 20 mg by mouth daily as needed (acid reflux). )   . oxyCODONE-acetaminophen (PERCOCET/ROXICET) 5-325 MG tablet Take 1 tablet by mouth every 4 (four) hours as needed for severe pain.   [name]  polyethylene glycol powder (GLYCOLAX/MIRALAX) 17 GM/SCOOP powder Take 17 g by mouth 2 (two) times daily. Until daily soft stools  OTC   . prochlorperazine (COMPAZINE) 10 MG tablet Take 1 tablet (10 mg total) by mouth every 6 (six) hours as needed for nausea or vomiting.   . rosuvastatin (CRESTOR) 20 MG tablet Take 1 tablet (20 mg total) by mouth daily. (Patient taking differently: Take 10 mg by mouth 2 (two) times daily as needed (cholesterol). )   . senna (SENOKOT) 8.6 MG TABS tablet Take 2 tablets by mouth daily.   [name] tiZANidine (ZANAFLEX) 4 MG tablet Take 4 mg by mouth daily as needed for muscle spasms.    . traMADol (ULTRAM) 50 MG tablet Take 1 tablet (50 mg total) by mouth every 6 (six) hours as needed.   . gabapentin (NEURONTIN) 100 MG capsule Take 1 capsule (100 mg total) by mouth at bedtime.    No facility-administered encounter medications on file as of 07/01/2019.     ALLERGIES:  Allergies  Allergen Reactions  . Penicillins Shortness Of Breath and Other (See Comments)    Has patient had a PCN reaction causing immediate rash, facial/tongue/throat swelling, SOB or lightheadedness with hypotension: Yes Has patient had a PCN reaction causing severe rash involving mucus membranes or skin necrosis: Unk Has patient had a PCN reaction that required hospitalization: Unk Has patient had a PCN reaction occurring within the last 10 years: Yes  "States that she was in ICU after being administered"   . Codeine Nausea And Vomiting  . Lipitor [Atorvastatin] Other (See Comments)    Myalgias  . Lisinopril Cough  . Pravastatin Other (See Comments)    Myalgias     PHYSICAL EXAM:  ECOG Performance status: 1  Vitals:   07/01/19 1146  BP: 121/63  Pulse: 74  Resp: 18  Temp: (!) 97.1 F (36.2 C)  SpO2: 100%   Filed Weights   07/01/19 1146  Weight: 167 lb 8 oz (76 kg)    Physical Exam Constitutional:      Appearance: Normal appearance. She is normal weight.  Cardiovascular:     Rate and Rhythm: Normal rate and regular rhythm.     Heart sounds: Normal heart sounds.  Pulmonary:     Effort: Pulmonary effort is normal.     Breath sounds: Normal breath sounds.  Abdominal:     General: Bowel sounds are normal.     Palpations: Abdomen is soft.  Musculoskeletal: Normal range of motion.  Skin:    General: Skin is warm and dry.  Neurological:     Mental Status: She is alert and oriented to person, place, and time. Mental status is at baseline.  Psychiatric:        Mood and Affect: Mood normal.        Behavior: Behavior normal.        Thought Content: Thought content normal.        Judgment: Judgment normal.      LABORATORY DATA:  I have reviewed the labs as listed.  CBC    Component Value Date/Time   WBC 31.0 (H) 07/01/2019 1048   RBC 3.66 (L) 07/01/2019 1048   HGB 10.5 (L) 07/01/2019 1048   HCT 33.1 (L) 07/01/2019 1048   PLT 172 07/01/2019 1048   MCV 90.4 07/01/2019 1048   MCH 28.7 07/01/2019 1048   MCHC 31.7 07/01/2019 1048   RDW 14.7 07/01/2019 1048   LYMPHSABS 2.3 07/01/2019 1048   MONOABS 1.7 (H) 07/01/2019  1048   EOSABS 0.0 07/01/2019 1048   BASOSABS 0.1 07/01/2019 1048   CMP Latest Ref Rng & Units 07/01/2019 06/23/2019 06/18/2019  Glucose 70 - 99 mg/dL 125(H) 118(H) 87  BUN 8 - 23 mg/dL 13 14 19   Creatinine 0.44 - 1.00 mg/dL 0.73 0.79 0.50  Sodium 135 - 145 mmol/L 135 128(L) 131(L)  Potassium 3.5 -  5.1 mmol/L 3.5 3.8 3.5  Chloride 98 - 111 mmol/L 101 94(L) 96(L)  CO2 22 - 32 mmol/L 23 22 24   Calcium 8.9 - 10.3 mg/dL 9.0 9.0 8.8(L)  Total Protein 6.5 - 8.1 g/dL 6.9 6.4(L) 7.1  Total Bilirubin 0.3 - 1.2 mg/dL 0.4 0.5 1.6(H)  Alkaline Phos 38 - 126 U/L 180(H) 95 94  AST 15 - 41 U/L 29 25 35  ALT 0 - 44 U/L 21 16 25        DIAGNOSTIC IMAGING:  I have independently reviewed the scans and discussed with the patient.   ASSESSMENT & PLAN:   Squamous cell carcinoma of lung, stage IV, right ([place]) 1.  Metastatic poorly differentiated squamous cell carcinoma of the lung: -PDL 1 TPS-10%, foundation 1 test results pending.  Presentation with low back pain. -PET scan on 05/08/2019 shows hypermetabolic right hilar, mediastinal, right supraclavicular adenopathy with solitary small hypermetabolic nodule in the right lung measuring 1 cm.  Focal activity in the right posterior oropharynx with no CT correlate.  Widespread hypermetabolic skeletal metastasis and solitary splenic metastasis.  MRI of the brain negative. -Right supraclavicular lymph node biopsy on 05/20/2019 shows metastatic poorly differentiated squamous cell carcinoma. -Cycle 1 of carboplatin, paclitaxel and pembrolizumab on 06/15/2019. -She had an allergic reaction to [facility].  We will switch it to Neulasta with next cycle. - She started feeling better about 5 days back. -We reviewed her blood work which is within normal limits.  She is complaining of burning sensation after urination.  Occasional blood was seen.  We will check a UA today.  If she has infection, will treated with Cipro twice daily for 5 days. -She will come back next week for her cycle 2.  I will consider decreasing the dose of carboplatin and paclitaxel during cycle 2.  2.  Weight loss: -She lost about 6 pounds in the first week after treatment.  She is able to eat better at this time.  She gained 3 pounds back. - She has not had a bowel movement since Saturday.  She is  taking half dose of MiraLAX.  She was told to increase it to full dose.  She was also told to take stool softener with it.  3.  Low back pain: - She had another injection done on 06/30/2019 in the back. -At last visit I have given Dilaudid 2 mg half tablet to be taken every 6-8 hours.  She has not started taking it yet.  4.  Neuropathy: -She reports pins-and-needles sensation in both her feet for the last few days.  She never had this sensation. -Likely from paclitaxel.  We will start her on gabapentin 100 mg at bedtime.  We talked about side effects in detail.  Total time spent is 20 minutes with more than 50% of the time spent face-to-face discussing treatment plan, counseling and coordination of care.  Orders placed this encounter:  No orders of the defined types were placed in this encounter.     Derek Jack, MD [place] [phone]

## 2019-07-01 NOTE — Patient Instructions (Addendum)
[facility] at [facility] Discharge Instructions  You were seen today by Dr. Katragadda. He went over your recent lab results. He will see you back in 1 week for labs and follow up.   Thank you for choosing  [facility] Center at [facility] to provide your oncology and hematology care.  To afford each patient quality time with our provider, please arrive at least 15 minutes before your scheduled appointment time.   If you have a lab appointment with the Cancer Center please come in thru the  Main Entrance and check in at the main information desk  You need to re-schedule your appointment should you arrive 10 or more minutes late.  We strive to give you quality time with our providers, and arriving late affects you and other patients whose appointments are after yours.  Also, if you no show three or more times for appointments you may be dismissed from the clinic at the providers discretion.     Again, thank you for choosing [facility].  Our hope is that these requests will decrease the amount of time that you wait before being seen by our physicians.       _____________________________________________________________  Should you have questions after your visit to [facility], please contact our office at [phone] between the hours of 8:00 a.m. and 4:30 p.m.  Voicemails left after 4:00 p.m. will not be returned until the following business day.  For prescription refill requests, have your pharmacy contact our office and allow 72 hours.    Cancer Center Support Programs:   > Cancer Support Group  2nd Tuesday of the month 1pm-2pm, Journey Room    

## 2019-07-02 ENCOUNTER — Telehealth (HOSPITAL_COMMUNITY): Payer: Self-pay | Admitting: Emergency Medicine

## 2019-07-02 ENCOUNTER — Other Ambulatory Visit (HOSPITAL_COMMUNITY): Payer: Self-pay | Admitting: Hematology

## 2019-07-02 MED ORDER — CIPROFLOXACIN HCL 500 MG PO TABS: 500.0000 mg | ORAL_TABLET | Freq: Two times a day (BID) | ORAL | 0 refills | Status: DC

## 2019-07-02 NOTE — Telephone Encounter (Signed)
Per Dr.Katragada patient has UTI. Cipro sent to pts pharmacy. Pt verbalized understanding of this .

## 2019-07-03 LAB — URINE CULTURE: Culture: >=100000 — AB

## 2019-07-06 ENCOUNTER — Other Ambulatory Visit: Payer: Self-pay

## 2019-07-06 ENCOUNTER — Inpatient Hospital Stay (HOSPITAL_COMMUNITY): Payer: Medicare Other

## 2019-07-06 ENCOUNTER — Inpatient Hospital Stay (HOSPITAL_BASED_OUTPATIENT_CLINIC_OR_DEPARTMENT_OTHER): Payer: Medicare Other | Admitting: Hematology

## 2019-07-06 ENCOUNTER — Encounter (HOSPITAL_COMMUNITY): Payer: Self-pay | Admitting: Hematology

## 2019-07-06 VITALS — BP 125/70 | HR 84 | Temp 96.7°F | Resp 18

## 2019-07-06 VITALS — BP 113/60 | HR 87 | Temp 97.7°F | Resp 18 | Wt 167.0 lb

## 2019-07-06 DIAGNOSIS — C799 Secondary malignant neoplasm of unspecified site: Secondary | ICD-10-CM

## 2019-07-06 DIAGNOSIS — C3491 Malignant neoplasm of unspecified part of right bronchus or lung: Secondary | ICD-10-CM

## 2019-07-06 LAB — CBC WITH DIFFERENTIAL/PLATELET
Abs Immature Granulocytes: 0.08 10*3/uL — ABNORMAL HIGH (ref 0.00–0.07)
Basophils Absolute: 0.1 10*3/uL (ref 0.0–0.1)
Basophils Relative: 1 %
Eosinophils Absolute: 0.6 10*3/uL — ABNORMAL HIGH (ref 0.0–0.5)
Eosinophils Relative: 5 %
HCT: 33 % — ABNORMAL LOW (ref 36.0–46.0)
Hemoglobin: 10.1 g/dL — ABNORMAL LOW (ref 12.0–15.0)
Immature Granulocytes: 1 %
Lymphocytes Relative: 21 %
Lymphs Abs: 2.6 10*3/uL (ref 0.7–4.0)
MCH: 28.5 pg (ref 26.0–34.0)
MCHC: 30.6 g/dL (ref 30.0–36.0)
MCV: 93 fL (ref 80.0–100.0)
Monocytes Absolute: 1.6 10*3/uL — ABNORMAL HIGH (ref 0.1–1.0)
Monocytes Relative: 13 %
Neutro Abs: 7.7 10*3/uL (ref 1.7–7.7)
Neutrophils Relative %: 59 %
Platelets: 343 10*3/uL (ref 150–400)
RBC: 3.55 MIL/uL — ABNORMAL LOW (ref 3.87–5.11)
RDW: 15.3 % (ref 11.5–15.5)
WBC: 12.6 10*3/uL — ABNORMAL HIGH (ref 4.0–10.5)
nRBC: 0 % (ref 0.0–0.2)

## 2019-07-06 LAB — COMPREHENSIVE METABOLIC PANEL
ALT: 23 U/L (ref 0–44)
AST: 24 U/L (ref 15–41)
Albumin: 3.8 g/dL (ref 3.5–5.0)
Alkaline Phosphatase: 175 U/L — ABNORMAL HIGH (ref 38–126)
Anion gap: 10 (ref 5–15)
BUN: 13 mg/dL (ref 8–23)
CO2: 22 mmol/L (ref 22–32)
Calcium: 8.9 mg/dL (ref 8.9–10.3)
Chloride: 104 mmol/L (ref 98–111)
Creatinine, Ser: 0.68 mg/dL (ref 0.44–1.00)
GFR calc Af Amer: >60 mL/min (ref >60)
GFR calc non Af Amer: >60 mL/min (ref >60)
Glucose, Bld: 78 mg/dL (ref 70–99)
Potassium: 3.9 mmol/L (ref 3.5–5.1)
Sodium: 136 mmol/L (ref 135–145)
Total Bilirubin: 0.4 mg/dL (ref 0.3–1.2)
Total Protein: 7.2 g/dL (ref 6.5–8.1)

## 2019-07-06 MED ORDER — ALTEPLASE 2 MG IJ SOLR
INTRAMUSCULAR | Status: AC
  Filled 2019-07-06: qty 2

## 2019-07-06 MED ORDER — DIPHENHYDRAMINE HCL 50 MG/ML IJ SOLN
50.0000 mg | Freq: Once | INTRAMUSCULAR | Status: AC
  Administered 2019-07-06: 50 mg via INTRAVENOUS
  Filled 2019-07-06: qty 1

## 2019-07-06 MED ORDER — SODIUM CHLORIDE 0.9 % IV SOLN
200.0000 mg | Freq: Once | INTRAVENOUS | Status: AC
  Administered 2019-07-06: 10:00:00 200 mg via INTRAVENOUS
  Filled 2019-07-06: qty 8

## 2019-07-06 MED ORDER — SODIUM CHLORIDE 0.9 % IV SOLN
Freq: Once | INTRAVENOUS | Status: AC
  Administered 2019-07-06: 10:00:00 via INTRAVENOUS
  Filled 2019-07-06: qty 5

## 2019-07-06 MED ORDER — SODIUM CHLORIDE 0.9 % IV SOLN
438.0000 mg | Freq: Once | INTRAVENOUS | Status: AC
  Administered 2019-07-06: 440 mg via INTRAVENOUS
  Filled 2019-07-06: qty 44

## 2019-07-06 MED ORDER — PALONOSETRON HCL INJECTION 0.25 MG/5ML
0.2500 mg | Freq: Once | INTRAVENOUS | Status: AC
  Administered 2019-07-06: 09:00:00 0.25 mg via INTRAVENOUS
  Filled 2019-07-06: qty 5

## 2019-07-06 MED ORDER — SODIUM CHLORIDE 0.9 % IV SOLN
Freq: Once | INTRAVENOUS | Status: AC
  Administered 2019-07-06: 09:00:00 via INTRAVENOUS

## 2019-07-06 MED ORDER — SODIUM CHLORIDE 0.9 % IV SOLN
175.0000 mg/m2 | Freq: Once | INTRAVENOUS | Status: AC
  Administered 2019-07-06: 11:00:00 336 mg via INTRAVENOUS
  Filled 2019-07-06: qty 56

## 2019-07-06 MED ORDER — SODIUM CHLORIDE 0.9% FLUSH
10.0000 mL | INTRAVENOUS | Status: DC | PRN
  Administered 2019-07-06: 10 mL
  Filled 2019-07-06: qty 10

## 2019-07-06 MED ORDER — ALTEPLASE 2 MG IJ SOLR
2.0000 mg | Freq: Once | INTRAMUSCULAR | Status: AC
  Administered 2019-07-06: 2 mg

## 2019-07-06 MED ORDER — HEPARIN SOD (PORK) LOCK FLUSH 100 UNIT/ML IV SOLN
500.0000 [IU] | Freq: Once | INTRAVENOUS | Status: AC | PRN
  Administered 2019-07-06: 15:00:00 500 [IU]

## 2019-07-06 MED ORDER — FAMOTIDINE IN NACL 20-0.9 MG/50ML-% IV SOLN
20.0000 mg | Freq: Once | INTRAVENOUS | Status: AC
  Administered 2019-07-06: 09:00:00 20 mg via INTRAVENOUS
  Filled 2019-07-06: qty 50

## 2019-07-06 MED ORDER — STERILE WATER FOR INJECTION IJ SOLN
INTRAMUSCULAR | Status: AC
  Filled 2019-07-06: qty 10

## 2019-07-06 NOTE — Progress Notes (Signed)
Patient seen by Dr. Delton Coombes with lab review and ok to treat today.  Chemotherapy dose reduced per Dr. Delton Coombes.   Patient tolerated chemotherapy with no complaints voiced.  Port site clean and dry with no bruising or swelling noted at site.  Good blood return noted before and after administration of chemotherapy.  Band aid applied.  Patient left by wheelchair with VSS and no s/s of distress noted.

## 2019-07-06 NOTE — Progress Notes (Signed)
Lindsey Mullins, Lindsey Mullins [id number]   CLINIC:  Medical Oncology/Hematology  PCP:  [name], MD [address] [place] [address] [phone]   REASON FOR VISIT: Metastatic squamous cell lung cancer.   INTERVAL HISTORY:  Lindsey Mullins 73 y.o. female seen for follow-up of metastatic lung cancer.  Cycle 1 of chemotherapy was on 06/15/2019.  She developed urinary infection at last visit 1 week ago.  We have given Cipro 500 twice daily which she took and her urinary symptoms are better.  She is complaining of constipation which is well controlled with stool softener and MiraLAX.  Appetite is 75%.  Energy levels are also 75%.  Pain in the back is reported as 4 out of 10.  No fevers or chills.  She is accompanied by her daughter.  Her appetite has improved.  REVIEW OF SYSTEMS:  Review of Systems  Gastrointestinal: Positive for constipation.  Musculoskeletal: Positive for back pain.  Neurological: Positive for numbness.  All other systems reviewed and are negative.    PAST MEDICAL/SURGICAL HISTORY:  Past Medical History:  Diagnosis Date  . Atypical chest pain 05/23/2012   STRESS TEST - small to moderate sized area of partial reversibility of the anteroapical wall, most likely breast artifact, post-stress EF 67%, EKG show NSR at 65, No Lexiscan EKG changes, non-diagnostic for ischemia; STRESS TEST, 01/25/2010 - normal study, post-stress EF 66%, no significant ischemia  . Cancer ([place])   . Cerebral atherosclerosis 09/29/2012   CAROTID DUPLEX - RIGHT  BULB/PROXIMAL ICA-moderate amount of fibrous plaque, 50-69% diameter reduction; LEFT CEA-normal, no significant diameter reduction  . Colitis   . GERD (gastroesophageal reflux disease)   . Hyperlipidemia   . Hypertension   . Osteopenia   . Restless leg syndrome   . Shortness of breath 03/30/2005   2D ECHO - EF >55%, normal  . TIA (transient ischemic attack) 01/27/2010   2D ECHO - EF 65%,  normal   Past Surgical History:  Procedure Laterality Date  . ABDOMINAL EXPLORATION SURGERY    . CAROTID ENDARTERECTOMY    . LYMPH NODE BIOPSY N/A 05/20/2019   Procedure: LYMPH NODE BIOPSY CERVICAL;  Surgeon: Aviva Signs, MD;  Location: [facility];  Service: General;  Laterality: N/A;  . Peripheral Vascular Catheterization  07/16/2005   Right verterbral-50% smooth segmental badsilar artery stenosis on right side, Right carotid-50% ulcerated proxmial stenosis, Left carotid-60 to 70% left externam carotid artery stenosis, 90% proximal left ICA stenosis  . PORTACATH PLACEMENT Left 06/12/2019   Procedure: INSERTION PORT-A-CATH (catheter attached left subclavian);  Surgeon: Aviva Signs, MD;  Location: [facility];  Service: General;  Laterality: Left;     SOCIAL HISTORY:  Social History   Socioeconomic History  . Marital status: Divorced    Spouse name: Not on file  . Number of children: 2  . Years of education: Not on file  . Highest education level: Not on file  Occupational History  . Not on file  Social Needs  . Financial resource strain: Not hard at all  . Food insecurity    Worry: Never true    Inability: Never true  . Transportation needs    Medical: No    Non-medical: No  Tobacco Use  . Smoking status: Former Smoker    Packs/day: 0.25    Years: 10.00    Pack years: 2.50    Types: Cigarettes    Quit date: 04/21/1983  Years since quitting: 36.2  . Smokeless tobacco: Never Used  Substance and Sexual Activity  . Alcohol use: No  . Drug use: No  . Sexual activity: Not Currently  Lifestyle  . Physical activity    Days per week: 0 days    Minutes per session: 0 min  . Stress: Not at all  Relationships  . Social Herbalist on phone: Twice a week    Gets together: Twice a week    Attends religious service: More than 4 times per year    Active member of club or organization: No    Attends meetings of clubs or organizations: Never    Relationship status: Divorced   . Intimate partner violence    Fear of current or ex partner: No    Emotionally abused: No    Physically abused: No    Forced sexual activity: No  Other Topics Concern  . Not on file  Social History Narrative  . Not on file    FAMILY HISTORY:  Family History  Problem Relation Age of Onset  . Heart disease Mother   . Hypertension Mother   . Hypertension Father   . Heart disease Father     CURRENT MEDICATIONS:  Outpatient Encounter Medications as of 07/06/2019  Medication Sig Note  . aspirin 81 MG tablet Take 81 mg by mouth daily.    . carvedilol (COREG) 3.125 MG tablet TAKE 1 TABLET BY MOUTH ONCE DAILY (Patient taking differently: Take 3.125 mg by mouth daily. )   . ciprofloxacin (CIPRO) 500 MG tablet Take 1 tablet (500 mg total) by mouth 2 (two) times daily.   . cyclobenzaprine (FLEXERIL) 5 MG tablet TAKE 1 TABLET BY MOUTH AT BEDTIME AS NEEDED FOR MUSCLE SPASM OR  LEG  CRAMPS (Patient taking differently: Take 5 mg by mouth at bedtime as needed for muscle spasms. )   . gabapentin (NEURONTIN) 100 MG capsule Take 1 capsule (100 mg total) by mouth at bedtime.   [name] HYDROmorphone (DILAUDID) 2 MG tablet Take 0.5 tablets (1 mg total) by mouth every 6 (six) hours as needed for severe pain.   . Lactulose 20 GM/30ML SOLN Lactulose 10gm.  Take 52ml to 62ml three times a day to assist with moving bowels.  Titrate down if having multiple bowel movements.  Call if no results after taking the medication three times a day. 06/18/2019: Haven't started yet  . lidocaine-prilocaine (EMLA) cream Apply 1 application topically as needed. Apply small amount to port a cath site and cover with plastic wrap one hour prior to chemotherapy appointments   . loratadine (CLARITIN) 10 MG tablet Take 10 mg by mouth daily as needed for allergies or itching.    . losartan (COZAAR) 50 MG tablet Take 1/2 (one-half) tablet by mouth once daily   . metoCLOPramide (REGLAN) 10 MG tablet Take 1 tablet (10 mg total) by mouth every  6 (six) hours as needed for nausea or vomiting.   . naproxen sodium (ALEVE) 220 MG tablet Take 220 mg by mouth 2 (two) times daily as needed (pain).    . nitroGLYCERIN (NITROSTAT) 0.4 MG SL tablet DISSOLVE ONE TABLET UNDER THE TONGUE AS NEEDED FOR CHEST PAIN**MAX 3 TABLETS EACH 5 MINUTES APART**   . Omega-3 Krill Oil 500 MG CAPS Take 500 mg by mouth daily.    [name] omeprazole (PRILOSEC) 20 MG capsule Take 1 po daily (Patient taking differently: Take 20 mg by mouth daily as needed (acid reflux). )   .  oxyCODONE-acetaminophen (PERCOCET/ROXICET) 5-325 MG tablet Take 1 tablet by mouth every 4 (four) hours as needed for severe pain.   . polyethylene glycol powder (GLYCOLAX/MIRALAX) 17 GM/SCOOP powder Take 17 g by mouth 2 (two) times daily. Until daily soft stools  OTC   . prochlorperazine (COMPAZINE) 10 MG tablet Take 1 tablet (10 mg total) by mouth every 6 (six) hours as needed for nausea or vomiting.   . rosuvastatin (CRESTOR) 20 MG tablet Take 1 tablet (20 mg total) by mouth daily. (Patient taking differently: Take 10 mg by mouth 2 (two) times daily as needed (cholesterol). )   . senna (SENOKOT) 8.6 MG TABS tablet Take 2 tablets by mouth daily.   [name] tiZANidine (ZANAFLEX) 4 MG tablet Take 4 mg by mouth daily as needed for muscle spasms.    . traMADol (ULTRAM) 50 MG tablet Take 1 tablet (50 mg total) by mouth every 6 (six) hours as needed.    Facility-Administered Encounter Medications as of 07/06/2019  Medication  . [COMPLETED] 0.9 %  sodium chloride infusion  . [COMPLETED] alteplase (CATHFLO ACTIVASE) injection 2 mg  . [COMPLETED] CARBOplatin (PARAPLATIN) 440 mg in sodium chloride 0.9 % 250 mL chemo infusion  . [COMPLETED] diphenhydrAMINE (BENADRYL) injection 50 mg  . [COMPLETED] famotidine (PEPCID) IVPB 20 mg premix  . [COMPLETED] fosaprepitant (EMEND) 150 mg, dexamethasone (DECADRON) 12 mg in sodium chloride 0.9 % 145 mL IVPB  . [COMPLETED] heparin lock flush 100 unit/mL  . [COMPLETED] PACLitaxel  (TAXOL) 336 mg in sodium chloride 0.9 % 500 mL chemo infusion (> 80mg /m2)  . [COMPLETED] palonosetron (ALOXI) injection 0.25 mg  . [COMPLETED] pembrolizumab (KEYTRUDA) 200 mg in sodium chloride 0.9 % 50 mL chemo infusion  . sodium chloride flush (NS) 0.9 % injection 10 mL    ALLERGIES:  Allergies  Allergen Reactions  . Penicillins Shortness Of Breath and Other (See Comments)    Has patient had a PCN reaction causing immediate rash, facial/tongue/throat swelling, SOB or lightheadedness with hypotension: Yes Has patient had a PCN reaction causing severe rash involving mucus membranes or skin necrosis: Unk Has patient had a PCN reaction that required hospitalization: Unk Has patient had a PCN reaction occurring within the last 10 years: Yes "States that she was in ICU after being administered"   . Codeine Nausea And Vomiting  . Lipitor [Atorvastatin] Other (See Comments)    Myalgias  . Lisinopril Cough  . Pravastatin Other (See Comments)    Myalgias     PHYSICAL EXAM:  ECOG Performance status: 1  Vitals:   07/06/19 0752  BP: 113/60  Pulse: 87  Resp: 18  Temp: 97.7 F (36.5 C)  SpO2: 97%   Filed Weights   07/06/19 0752  Weight: 167 lb (75.8 kg)    Physical Exam Constitutional:      Appearance: Normal appearance. She is normal weight.  Cardiovascular:     Rate and Rhythm: Normal rate and regular rhythm.     Heart sounds: Normal heart sounds.  Pulmonary:     Effort: Pulmonary effort is normal.     Breath sounds: Normal breath sounds.  Abdominal:     General: Bowel sounds are normal.     Palpations: Abdomen is soft.  Musculoskeletal: Normal range of motion.  Skin:    General: Skin is warm and dry.  Neurological:     Mental Status: She is alert and oriented to person, place, and time. Mental status is at baseline.  Psychiatric:  Mood and Affect: Mood normal.        Behavior: Behavior normal.        Thought Content: Thought content normal.        Judgment:  Judgment normal.      LABORATORY DATA:  I have reviewed the labs as listed.  CBC    Component Value Date/Time   WBC 12.6 (H) 07/06/2019 0750   RBC 3.55 (L) 07/06/2019 0750   HGB 10.1 (L) 07/06/2019 0750   HCT 33.0 (L) 07/06/2019 0750   PLT 343 07/06/2019 0750   MCV 93.0 07/06/2019 0750   MCH 28.5 07/06/2019 0750   MCHC 30.6 07/06/2019 0750   RDW 15.3 07/06/2019 0750   LYMPHSABS 2.6 07/06/2019 0750   MONOABS 1.6 (H) 07/06/2019 0750   EOSABS 0.6 (H) 07/06/2019 0750   BASOSABS 0.1 07/06/2019 0750   CMP Latest Ref Rng & Units 07/06/2019 07/01/2019 06/23/2019  Glucose 70 - 99 mg/dL 78 125(H) 118(H)  BUN 8 - 23 mg/dL 13 13 14   Creatinine 0.44 - 1.00 mg/dL 0.68 0.73 0.79  Sodium 135 - 145 mmol/L 136 135 128(L)  Potassium 3.5 - 5.1 mmol/L 3.9 3.5 3.8  Chloride 98 - 111 mmol/L 104 101 94(L)  CO2 22 - 32 mmol/L 22 23 22   Calcium 8.9 - 10.3 mg/dL 8.9 9.0 9.0  Total Protein 6.5 - 8.1 g/dL 7.2 6.9 6.4(L)  Total Bilirubin 0.3 - 1.2 mg/dL 0.4 0.4 0.5  Alkaline Phos 38 - 126 U/L 175(H) 180(H) 95  AST 15 - 41 U/L 24 29 25   ALT 0 - 44 U/L 23 21 16        DIAGNOSTIC IMAGING:  I have independently reviewed the scans and discussed with the patient.   ASSESSMENT & PLAN:   Squamous cell carcinoma of lung, stage IV, right ([place]) 1.  Metastatic poorly differentiated squamous cell carcinoma of the lung: -PDL 1 TPS-10%, foundation 1 test results pending.  Presentation with low back pain. -PET scan on 05/08/2019 shows hypermetabolic right hilar, mediastinal, right supraclavicular adenopathy with solitary small hypermetabolic nodule in the right lung measuring 1 cm.  Focal activity in the right posterior oropharynx with no CT correlate.  Widespread hypermetabolic skeletal metastasis and solitary splenic metastasis.  MRI of the brain was negative. -Right supraclavicular lymph node biopsy on 05/20/2019 shows metastatic poorly differentiated squamous cell carcinoma. - Cycle 1 of carboplatin, paclitaxel  and pembrolizumab on 06/15/2019. - She had allergic reaction to [facility].  Will change to Neulasta today. - She also had some weight loss of 6 pounds after cycle 1.  I have reviewed her blood work. -I will cut back on the dose of carboplatin to AUC 5 and paclitaxel to 175 mg/M square for cycle 2 today. -I will reevaluate her in 1 week.  2.  Weight loss: -He lost 6 pounds after 1 week after cycle 1. -She will begin her appetite and gained back 3 pounds.  Weight has been stable since last week.  3.  Constipation: -She is taking MiraLAX full dose as well as stool softener.  Her bowels are moving better.  4.  Low back pain: -She had second steroid injection on 06/30/2019.  Pain is slightly better.  She has not started taking Dilaudid yet.  5.  Neuropathy: -She reported pins-and-needles sensation in both her feet mostly at nighttime.  Likely from paclitaxel. -I have given her gabapentin 100 mg at bedtime.  She was told to start taking it.  Total time spent is 20 minutes with  more than 50% of the time spent face-to-face discussing treatment plan, counseling and coordination of care.  Orders placed this encounter:  Orders Placed This Encounter  Procedures  . CBC with Differential/Platelet  . Comprehensive metabolic panel      Derek Jack, MD [place] [phone]

## 2019-07-06 NOTE — Progress Notes (Signed)
Port would not give blood.  Alteplase used.  Peripheral IV stick for lab work.  Dr. Delton Coombes made aware.

## 2019-07-06 NOTE — Patient Instructions (Signed)
[facility] at [facility] Discharge Instructions  You were seen today by Dr. Katragadda. He went over your recent lab results. He will see you back in 1 week for labs and follow up.   Thank you for choosing [facility] at [facility] to provide your oncology and hematology care.  To afford each patient quality time with our provider, please arrive at least 15 minutes before your scheduled appointment time.   If you have a lab appointment with the Cancer Center please come in thru the  Main Entrance and check in at the main information desk  You need to re-schedule your appointment should you arrive 10 or more minutes late.  We strive to give you quality time with our providers, and arriving late affects you and other patients whose appointments are after yours.  Also, if you no show three or more times for appointments you may be dismissed from the clinic at the providers discretion.     Again, thank you for choosing [facility].  Our hope is that these requests will decrease the amount of time that you wait before being seen by our physicians.       _____________________________________________________________  Should you have questions after your visit to [facility], please contact our office at [phone] between the hours of 8:00 a.m. and 4:30 p.m.  Voicemails left after 4:00 p.m. will not be returned until the following business day.  For prescription refill requests, have your pharmacy contact our office and allow 72 hours.    Cancer Center Support Programs:   > Cancer Support Group  2nd Tuesday of the month 1pm-2pm, Journey Room    

## 2019-07-06 NOTE — Assessment & Plan Note (Signed)
1.  Metastatic poorly differentiated squamous cell carcinoma of the lung: -PDL 1 TPS-10%, foundation 1 test results pending.  Presentation with low back pain. -PET scan on 05/08/2019 shows hypermetabolic right hilar, mediastinal, right supraclavicular adenopathy with solitary small hypermetabolic nodule in the right lung measuring 1 cm.  Focal activity in the right posterior oropharynx with no CT correlate.  Widespread hypermetabolic skeletal metastasis and solitary splenic metastasis.  MRI of the brain was negative. -Right supraclavicular lymph node biopsy on 05/20/2019 shows metastatic poorly differentiated squamous cell carcinoma. - Cycle 1 of carboplatin, paclitaxel and pembrolizumab on 06/15/2019. - She had allergic reaction to [facility].  Will change to Neulasta today. - She also had some weight loss of 6 pounds after cycle 1.  I have reviewed her blood work. -I will cut back on the dose of carboplatin to AUC 5 and paclitaxel to 175 mg/M square for cycle 2 today. -I will reevaluate her in 1 week.  2.  Weight loss: -He lost 6 pounds after 1 week after cycle 1. -She will begin her appetite and gained back 3 pounds.  Weight has been stable since last week.  3.  Constipation: -She is taking MiraLAX full dose as well as stool softener.  Her bowels are moving better.  4.  Low back pain: -She had second steroid injection on 06/30/2019.  Pain is slightly better.  She has not started taking Dilaudid yet.  5.  Neuropathy: -She reported pins-and-needles sensation in both her feet mostly at nighttime.  Likely from paclitaxel. -I have given her gabapentin 100 mg at bedtime.  She was told to start taking it.

## 2019-07-08 ENCOUNTER — Encounter (HOSPITAL_COMMUNITY): Payer: Self-pay

## 2019-07-08 ENCOUNTER — Inpatient Hospital Stay (HOSPITAL_COMMUNITY): Payer: Medicare Other

## 2019-07-08 ENCOUNTER — Other Ambulatory Visit: Payer: Self-pay

## 2019-07-08 VITALS — BP 119/86 | HR 84 | Temp 97.7°F | Resp 18

## 2019-07-08 DIAGNOSIS — C3491 Malignant neoplasm of unspecified part of right bronchus or lung: Secondary | ICD-10-CM | POA: Diagnosis not present

## 2019-07-08 DIAGNOSIS — C799 Secondary malignant neoplasm of unspecified site: Secondary | ICD-10-CM

## 2019-07-08 MED ORDER — PEGFILGRASTIM INJECTION 6 MG/0.6ML ~~LOC~~
6.0000 mg | PREFILLED_SYRINGE | Freq: Once | SUBCUTANEOUS | Status: AC
  Administered 2019-07-08: 13:00:00 6 mg via SUBCUTANEOUS

## 2019-07-08 NOTE — Progress Notes (Signed)
Patient tolerated injection with no complaints voiced.  Monitored 15 minutes after injection with no s/s of distress noted.  Site clean and dry with no bruising or swelling noted at site.  Band aid applied.  Vss with discharge and left by wheelchair with no s/s of distress noted.

## 2019-07-14 ENCOUNTER — Encounter (HOSPITAL_COMMUNITY): Payer: Self-pay | Admitting: Hematology

## 2019-07-14 ENCOUNTER — Other Ambulatory Visit: Payer: Self-pay

## 2019-07-14 ENCOUNTER — Inpatient Hospital Stay (HOSPITAL_COMMUNITY): Payer: Medicare Other

## 2019-07-14 ENCOUNTER — Inpatient Hospital Stay (HOSPITAL_BASED_OUTPATIENT_CLINIC_OR_DEPARTMENT_OTHER): Payer: Medicare Other | Admitting: Hematology

## 2019-07-14 DIAGNOSIS — C3491 Malignant neoplasm of unspecified part of right bronchus or lung: Secondary | ICD-10-CM | POA: Diagnosis not present

## 2019-07-14 LAB — CBC WITH DIFFERENTIAL/PLATELET
Abs Immature Granulocytes: 0.91 10*3/uL — ABNORMAL HIGH (ref 0.00–0.07)
Basophils Absolute: 0.1 10*3/uL (ref 0.0–0.1)
Basophils Relative: 1 %
Eosinophils Absolute: 1.4 10*3/uL — ABNORMAL HIGH (ref 0.0–0.5)
Eosinophils Relative: 11 %
HCT: 32.7 % — ABNORMAL LOW (ref 36.0–46.0)
Hemoglobin: 10.4 g/dL — ABNORMAL LOW (ref 12.0–15.0)
Immature Granulocytes: 7 %
Lymphocytes Relative: 14 %
Lymphs Abs: 1.8 10*3/uL (ref 0.7–4.0)
MCH: 28.9 pg (ref 26.0–34.0)
MCHC: 31.8 g/dL (ref 30.0–36.0)
MCV: 90.8 fL (ref 80.0–100.0)
Monocytes Absolute: 2.1 10*3/uL — ABNORMAL HIGH (ref 0.1–1.0)
Monocytes Relative: 16 %
Neutro Abs: 6.6 10*3/uL (ref 1.7–7.7)
Neutrophils Relative %: 51 %
Platelets: 202 10*3/uL (ref 150–400)
RBC: 3.6 MIL/uL — ABNORMAL LOW (ref 3.87–5.11)
RDW: 15.4 % (ref 11.5–15.5)
WBC: 12.9 10*3/uL — ABNORMAL HIGH (ref 4.0–10.5)
nRBC: 0 % (ref 0.0–0.2)

## 2019-07-14 LAB — COMPREHENSIVE METABOLIC PANEL
ALT: 22 U/L (ref 0–44)
AST: 25 U/L (ref 15–41)
Albumin: 4.1 g/dL (ref 3.5–5.0)
Alkaline Phosphatase: 174 U/L — ABNORMAL HIGH (ref 38–126)
Anion gap: 11 (ref 5–15)
BUN: 10 mg/dL (ref 8–23)
CO2: 23 mmol/L (ref 22–32)
Calcium: 9.2 mg/dL (ref 8.9–10.3)
Chloride: 101 mmol/L (ref 98–111)
Creatinine, Ser: 0.61 mg/dL (ref 0.44–1.00)
GFR calc Af Amer: >60 mL/min (ref >60)
GFR calc non Af Amer: >60 mL/min (ref >60)
Glucose, Bld: 92 mg/dL (ref 70–99)
Potassium: 3.4 mmol/L — ABNORMAL LOW (ref 3.5–5.1)
Sodium: 135 mmol/L (ref 135–145)
Total Bilirubin: 0.6 mg/dL (ref 0.3–1.2)
Total Protein: 7.5 g/dL (ref 6.5–8.1)

## 2019-07-14 NOTE — Patient Instructions (Addendum)
[facility] at [facility] Discharge Instructions  You were seen today by Dr. Katragadda. He went over your recent lab results. He will see you back in 2 weeks for labs and follow up.   Thank you for choosing [facility] at [facility] to provide your oncology and hematology care.  To afford each patient quality time with our provider, please arrive at least 15 minutes before your scheduled appointment time.   If you have a lab appointment with the Cancer Center please come in thru the  Main Entrance and check in at the main information desk  You need to re-schedule your appointment should you arrive 10 or more minutes late.  We strive to give you quality time with our providers, and arriving late affects you and other patients whose appointments are after yours.  Also, if you no show three or more times for appointments you may be dismissed from the clinic at the providers discretion.     Again, thank you for choosing [facility].  Our hope is that these requests will decrease the amount of time that you wait before being seen by our physicians.       _____________________________________________________________  Should you have questions after your visit to [facility], please contact our office at [phone] between the hours of 8:00 a.m. and 4:30 p.m.  Voicemails left after 4:00 p.m. will not be returned until the following business day.  For prescription refill requests, have your pharmacy contact our office and allow 72 hours.    Cancer Center Support Programs:   > Cancer Support Group  2nd Tuesday of the month 1pm-2pm, Journey Room    

## 2019-07-14 NOTE — Progress Notes (Signed)
[place], [place] [address]   CLINIC:  Medical Oncology/Hematology  PCP:  [name], MD [address] [place] [address] [phone]   REASON FOR VISIT: Metastatic squamous cell lung cancer.   INTERVAL HISTORY:  Ms. Bryngelson 73 y.o. female seen for follow-up of metastatic lung cancer.  She received cycle 2 on 07/06/2019.  She had 20% dose reduction.  She reported mainly lower extremity pains and numbness.  She also had generalized body pains after Neulasta injection which lasted 2 to 4 days.  Denies any nausea or vomiting.  Constipation is very well controlled at this time.  She has not started taking any Neurontin.  She reported numbness in left feet.  She also reported achy type of pain in the legs bilaterally below the knees.  Appetite is 50%.  Energy levels are 25%.  Denies any fevers or night sweats.  She lost about 5 pounds since last visit.  REVIEW OF SYSTEMS:  Review of Systems  Neurological: Positive for numbness.  Psychiatric/Behavioral: Positive for sleep disturbance.  All other systems reviewed and are negative.    PAST MEDICAL/SURGICAL HISTORY:  Past Medical History:  Diagnosis Date  . Atypical chest pain 05/23/2012   STRESS TEST - small to moderate sized area of partial reversibility of the anteroapical wall, most likely breast artifact, post-stress EF 67%, EKG show NSR at 65, No Lexiscan EKG changes, non-diagnostic for ischemia; STRESS TEST, 01/25/2010 - normal study, post-stress EF 66%, no significant ischemia  . Cancer ([facility])   . Cerebral atherosclerosis 09/29/2012   CAROTID DUPLEX - RIGHT  BULB/PROXIMAL ICA-moderate amount of fibrous plaque, 50-69% diameter reduction; LEFT CEA-normal, no significant diameter reduction  . Colitis   . GERD (gastroesophageal reflux disease)   . Hyperlipidemia   . Hypertension   . Osteopenia   . Restless leg syndrome   . Shortness of breath 03/30/2005   2D ECHO - EF >55%,  normal  . TIA (transient ischemic attack) 01/27/2010   2D ECHO - EF 65%, normal   Past Surgical History:  Procedure Laterality Date  . ABDOMINAL EXPLORATION SURGERY    . CAROTID ENDARTERECTOMY    . LYMPH NODE BIOPSY N/A 05/20/2019   Procedure: LYMPH NODE BIOPSY CERVICAL;  Surgeon: Aviva Signs, MD;  Location: [facility];  Service: General;  Laterality: N/A;  . Peripheral Vascular Catheterization  07/16/2005   Right verterbral-50% smooth segmental badsilar artery stenosis on right side, Right carotid-50% ulcerated proxmial stenosis, Left carotid-60 to 70% left externam carotid artery stenosis, 90% proximal left ICA stenosis  . PORTACATH PLACEMENT Left 06/12/2019   Procedure: INSERTION PORT-A-CATH (catheter attached left subclavian);  Surgeon: Aviva Signs, MD;  Location: [facility];  Service: General;  Laterality: Left;     SOCIAL HISTORY:  Social History   Socioeconomic History  . Marital status: Divorced    Spouse name: Not on file  . Number of children: 2  . Years of education: Not on file  . Highest education level: Not on file  Occupational History  . Not on file  Social Needs  . Financial resource strain: Not hard at all  . Food insecurity    Worry: Never true    Inability: Never true  . Transportation needs    Medical: No    Non-medical: No  Tobacco Use  . Smoking status: Former Smoker    Packs/day: 0.25    Years: 10.00    Pack years: 2.50  Types: Cigarettes    Quit date: 04/21/1983    Years since quitting: 36.2  . Smokeless tobacco: Never Used  Substance and Sexual Activity  . Alcohol use: No  . Drug use: No  . Sexual activity: Not Currently  Lifestyle  . Physical activity    Days per week: 0 days    Minutes per session: 0 min  . Stress: Not at all  Relationships  . Social Herbalist on phone: Twice a week    Gets together: Twice a week    Attends religious service: More than 4 times per year    Active member of club or organization: No    Attends  meetings of clubs or organizations: Never    Relationship status: Divorced  . Intimate partner violence    Fear of current or ex partner: No    Emotionally abused: No    Physically abused: No    Forced sexual activity: No  Other Topics Concern  . Not on file  Social History Narrative  . Not on file    FAMILY HISTORY:  Family History  Problem Relation Age of Onset  . Heart disease Mother   . Hypertension Mother   . Hypertension Father   . Heart disease Father     CURRENT MEDICATIONS:  Outpatient Encounter Medications as of 07/14/2019  Medication Sig Note  . aspirin 81 MG tablet Take 81 mg by mouth daily.    . carvedilol (COREG) 3.125 MG tablet TAKE 1 TABLET BY MOUTH ONCE DAILY (Patient taking differently: Take 3.125 mg by mouth daily. )   . cyclobenzaprine (FLEXERIL) 5 MG tablet TAKE 1 TABLET BY MOUTH AT BEDTIME AS NEEDED FOR MUSCLE SPASM OR  LEG  CRAMPS (Patient taking differently: Take 5 mg by mouth at bedtime as needed for muscle spasms. )   . gabapentin (NEURONTIN) 100 MG capsule Take 1 capsule (100 mg total) by mouth at bedtime.   . Lactulose 20 GM/30ML SOLN Lactulose 10gm.  Take 31ml to 64ml three times a day to assist with moving bowels.  Titrate down if having multiple bowel movements.  Call if no results after taking the medication three times a day. 06/18/2019: Haven't started yet  . lidocaine-prilocaine (EMLA) cream Apply 1 application topically as needed. Apply small amount to port a cath site and cover with plastic wrap one hour prior to chemotherapy appointments   . loratadine (CLARITIN) 10 MG tablet Take 10 mg by mouth daily as needed for allergies or itching.    . losartan (COZAAR) 50 MG tablet Take 1/2 (one-half) tablet by mouth once daily   . metoCLOPramide (REGLAN) 10 MG tablet Take 1 tablet (10 mg total) by mouth every 6 (six) hours as needed for nausea or vomiting.   . naproxen sodium (ALEVE) 220 MG tablet Take 220 mg by mouth 2 (two) times daily as needed (pain).     . nitroGLYCERIN (NITROSTAT) 0.4 MG SL tablet DISSOLVE ONE TABLET UNDER THE TONGUE AS NEEDED FOR CHEST PAIN**MAX 3 TABLETS EACH 5 MINUTES APART**   . Omega-3 Krill Oil 500 MG CAPS Take 500 mg by mouth daily.    [name] omeprazole (PRILOSEC) 20 MG capsule Take 1 po daily (Patient taking differently: Take 20 mg by mouth daily as needed (acid reflux). )   . oxyCODONE-acetaminophen (PERCOCET/ROXICET) 5-325 MG tablet Take 1 tablet by mouth every 4 (four) hours as needed for severe pain.   . polyethylene glycol powder (GLYCOLAX/MIRALAX) 17 GM/SCOOP powder Take 17 g  by mouth 2 (two) times daily. Until daily soft stools  OTC   . prochlorperazine (COMPAZINE) 10 MG tablet Take 1 tablet (10 mg total) by mouth every 6 (six) hours as needed for nausea or vomiting.   . rosuvastatin (CRESTOR) 20 MG tablet Take 1 tablet (20 mg total) by mouth daily. (Patient taking differently: Take 10 mg by mouth 2 (two) times daily as needed (cholesterol). )   . senna (SENOKOT) 8.6 MG TABS tablet Take 2 tablets by mouth daily.   [name] tiZANidine (ZANAFLEX) 4 MG tablet Take 4 mg by mouth daily as needed for muscle spasms.    . traMADol (ULTRAM) 50 MG tablet Take 1 tablet (50 mg total) by mouth every 6 (six) hours as needed.   . [DISCONTINUED] ciprofloxacin (CIPRO) 500 MG tablet Take 1 tablet (500 mg total) by mouth 2 (two) times daily.   [name] HYDROmorphone (DILAUDID) 2 MG tablet Take 0.5 tablets (1 mg total) by mouth every 6 (six) hours as needed for severe pain. (Patient not taking: Reported on 07/14/2019)    No facility-administered encounter medications on file as of 07/14/2019.     ALLERGIES:  Allergies  Allergen Reactions  . Penicillins Shortness Of Breath and Other (See Comments)    Has patient had a PCN reaction causing immediate rash, facial/tongue/throat swelling, SOB or lightheadedness with hypotension: Yes Has patient had a PCN reaction causing severe rash involving mucus membranes or skin necrosis: Unk Has patient had a PCN  reaction that required hospitalization: Unk Has patient had a PCN reaction occurring within the last 10 years: Yes "States that she was in ICU after being administered"   . Codeine Nausea And Vomiting  . Lipitor [Atorvastatin] Other (See Comments)    Myalgias  . Lisinopril Cough  . Pravastatin Other (See Comments)    Myalgias     PHYSICAL EXAM:  ECOG Performance status: 1  Vitals:   07/14/19 1043  BP: 93/61  Pulse: 81  Resp: 16  Temp: (!) 97.3 F (36.3 C)  SpO2: 100%   Filed Weights   07/14/19 1043  Weight: 162 lb (73.5 kg)    Physical Exam Constitutional:      Appearance: Normal appearance. She is normal weight.  Cardiovascular:     Rate and Rhythm: Normal rate and regular rhythm.     Heart sounds: Normal heart sounds.  Pulmonary:     Effort: Pulmonary effort is normal.     Breath sounds: Normal breath sounds.  Abdominal:     General: Bowel sounds are normal.     Palpations: Abdomen is soft.  Musculoskeletal: Normal range of motion.  Skin:    General: Skin is warm and dry.  Neurological:     Mental Status: She is alert and oriented to person, place, and time. Mental status is at baseline.  Psychiatric:        Mood and Affect: Mood normal.        Behavior: Behavior normal.        Thought Content: Thought content normal.        Judgment: Judgment normal.      LABORATORY DATA:  I have reviewed the labs as listed.  CBC    Component Value Date/Time   WBC 12.9 (H) 07/14/2019 0946   RBC 3.60 (L) 07/14/2019 0946   HGB 10.4 (L) 07/14/2019 0946   HCT 32.7 (L) 07/14/2019 0946   PLT 202 07/14/2019 0946   MCV 90.8 07/14/2019 0946   MCH 28.9 07/14/2019 0946  MCHC 31.8 07/14/2019 0946   RDW 15.4 07/14/2019 0946   LYMPHSABS 1.8 07/14/2019 0946   MONOABS 2.1 (H) 07/14/2019 0946   EOSABS 1.4 (H) 07/14/2019 0946   BASOSABS 0.1 07/14/2019 0946   CMP Latest Ref Rng & Units 07/14/2019 07/06/2019 07/01/2019  Glucose 70 - 99 mg/dL 92 78 125(H)  BUN 8 - 23 mg/dL 10  13 13   Creatinine 0.44 - 1.00 mg/dL 0.61 0.68 0.73  Sodium 135 - 145 mmol/L 135 136 135  Potassium 3.5 - 5.1 mmol/L 3.4(L) 3.9 3.5  Chloride 98 - 111 mmol/L 101 104 101  CO2 22 - 32 mmol/L 23 22 23   Calcium 8.9 - 10.3 mg/dL 9.2 8.9 9.0  Total Protein 6.5 - 8.1 g/dL 7.5 7.2 6.9  Total Bilirubin 0.3 - 1.2 mg/dL 0.6 0.4 0.4  Alkaline Phos 38 - 126 U/L 174(H) 175(H) 180(H)  AST 15 - 41 U/L 25 24 29   ALT 0 - 44 U/L 22 23 21        DIAGNOSTIC IMAGING:  I have independently reviewed the scans and discussed with the patient.   ASSESSMENT & PLAN:   Squamous cell carcinoma of lung, stage IV, right ([facility]) 1.  Metastatic poorly differentiated squamous cell carcinoma of the lung: -PDL 1 TPS-10%, foundation 1 results pending.  Presentation with low back pain. -PET scan on 05/08/2019 showed hypermetabolic right hilar, mediastinal, right supraclavicular adenopathy with solitary small hypermetabolic nodule in the right lung measuring 1 cm.  Focal activity in the right posterior oropharynx with no CT correlate.  Widespread hypermetabolic skeletal metastasis and solitary splenic metastasis.  MRI of the brain was negative. -Right supraclavicular lymph node biopsy on 05/20/2019 shows metastatic poorly differentiated squamous cell carcinoma. - Cycle 1 carboplatin, paclitaxel and pembrolizumab on 06/15/2019, cycle 2 on 07/06/2019 with dose reduction (AUC 5 and paclitaxel 175 mg/M2). - She denies any nausea vomiting or diarrhea after this cycle.  I have reviewed her blood work which is grossly within normal limits.  She will come back in 2 weeks for follow-up prior to cycle 3.  We will plan to repeat scans after cycle 3.  We will consider dose reduction if she continues to have more pains with taxanes.  2.  Weight loss: -He lost about 5 pounds in the last 1 week.  She lost 6 pounds after cycle 1. -She is drinking ensures 2 cans/day.  She is eating 3 meals per day.  However she is eating small meals.  She was  encouraged to eat better.  3.  Taxane-induced lower extremity pains: -She reported pains in both legs and feet, below knee level.  She also reports some numbness in the bottom of the feet.  I have given prescription gabapentin 100 mg to be used 2 visits ago.  She has not started taking secondary to possible side effects.  I have recommended to start taking today and slowly titrate up to 2-3 times a day.  We will also increase the dose based on response.  She also has Dilaudid if needed.  4.  Constipation: -She is taking MiraLAX full dose as well as stool softener which is helping.  5.  Low back pain: -She had second steroid injection on 06/30/2019.  Pain is slightly better.  Total time spent is 20 minutes with more than 50% of the time spent face-to-face discussing treatment plan, counseling and coordination of care.  Orders placed this encounter:  No orders of the defined types were placed in this encounter.  Derek Jack, MD [place] [phone]

## 2019-07-14 NOTE — Assessment & Plan Note (Addendum)
1.  Metastatic poorly differentiated squamous cell carcinoma of the lung: -PDL 1 TPS-10%, foundation 1 results pending.  Presentation with low back pain. -PET scan on 05/08/2019 showed hypermetabolic right hilar, mediastinal, right supraclavicular adenopathy with solitary small hypermetabolic nodule in the right lung measuring 1 cm.  Focal activity in the right posterior oropharynx with no CT correlate.  Widespread hypermetabolic skeletal metastasis and solitary splenic metastasis.  MRI of the brain was negative. -Right supraclavicular lymph node biopsy on 05/20/2019 shows metastatic poorly differentiated squamous cell carcinoma. - Cycle 1 carboplatin, paclitaxel and pembrolizumab on 06/15/2019, cycle 2 on 07/06/2019 with dose reduction (AUC 5 and paclitaxel 175 mg/M2). - She denies any nausea vomiting or diarrhea after this cycle.  I have reviewed her blood work which is grossly within normal limits.  She will come back in 2 weeks for follow-up prior to cycle 3.  We will plan to repeat scans after cycle 3.  We will consider dose reduction if she continues to have more pains with taxanes.  2.  Weight loss: -He lost about 5 pounds in the last 1 week.  She lost 6 pounds after cycle 1. -She is drinking ensures 2 cans/day.  She is eating 3 meals per day.  However she is eating small meals.  She was encouraged to eat better.  3.  Taxane-induced lower extremity pains: -She reported pains in both legs and feet, below knee level.  She also reports some numbness in the bottom of the feet.  I have given prescription gabapentin 100 mg to be used 2 visits ago.  She has not started taking secondary to possible side effects.  I have recommended to start taking today and slowly titrate up to 2-3 times a day.  We will also increase the dose based on response.  She also has Dilaudid if needed.  4.  Constipation: -She is taking MiraLAX full dose as well as stool softener which is helping.  5.  Low back pain: -She had  second steroid injection on 06/30/2019.  Pain is slightly better.

## 2019-07-16 NOTE — Procedures (Signed)
Lumbar Facet Joint Intra-Articular Injection(s) with Fluoroscopic Guidance  Patient: Lindsey Mullins      Date of Birth: 1946/08/31 MRN: [id number] PCP: [name], MD      Visit Date: 06/30/2019   Universal Protocol:    Date/Time: 06/30/2019  Consent Given By: the patient  Position: PRONE   Additional Comments: Vital signs were monitored before and after the procedure. Patient was prepped and draped in the usual sterile fashion. The correct patient, procedure, and site was verified.   Injection Procedure Details:  Procedure Site One Meds Administered:  Meds ordered this encounter  Medications  . methylPREDNISolone acetate (DEPO-MEDROL) injection 80 mg     Laterality: Bilateral  Location/Site:  L4-L5  Needle size: 22 guage  Needle type: Spinal  Needle Placement: Articular  Findings:  -Comments: Excellent flow of contrast producing a partial arthrogram.  Procedure Details: The fluoroscope beam is vertically oriented in [facility], and the inferior recess is visualized beneath the lower pole of the inferior apophyseal process, which represents the target point for needle insertion. When direct visualization is difficult the target point is located at the medial projection of the vertebral pedicle. The region overlying each aforementioned target is locally anesthetized with a 1 to 2 ml. volume of 1% Lidocaine without Epinephrine.   The spinal needle was inserted into each of the above mentioned facet joints using biplanar fluoroscopic guidance. A 0.25 to 0.5 ml. volume of Isovue-250 was injected and a partial facet joint arthrogram was obtained. A single spot film was obtained of the resulting arthrogram.    One to 1.25 ml of the steroid/anesthetic solution was then injected into each of the facet joints noted above.   Additional Comments:  The patient tolerated the procedure well Dressing: 2 x 2 sterile gauze and Band-Aid    Post-procedure details: Patient was  observed during the procedure. Post-procedure instructions were reviewed.  Patient left the clinic in stable condition.

## 2019-07-16 NOTE — Procedures (Signed)
Lumbar Epidural Steroid Injection - Interlaminar Approach with Fluoroscopic Guidance  Patient: Lindsey Mullins      Date of Birth: 08/03/1946 MRN: [id number] PCP: [name], MD      Visit Date: 06/09/2019   Universal Protocol:     Consent Given By: the patient  Position: PRONE  Additional Comments: Vital signs were monitored before and after the procedure. Patient was prepped and draped in the usual sterile fashion. The correct patient, procedure, and site was verified.   Injection Procedure Details:  Procedure Site One Meds Administered:  Meds ordered this encounter  Medications  . methylPREDNISolone acetate (DEPO-MEDROL) injection 80 mg     Laterality: Right  Location/Site:  L4-L5  Needle size: 20 G  Needle type: Tuohy  Needle Placement: Paramedian epidural  Findings:   -Comments: Excellent flow of contrast into the epidural space.  Procedure Details: Using a paramedian approach from the side mentioned above, the region overlying the inferior lamina was localized under fluoroscopic visualization and the soft tissues overlying this structure were infiltrated with 4 ml. of 1% Lidocaine without Epinephrine. The Tuohy needle was inserted into the epidural space using a paramedian approach.   The epidural space was localized using loss of resistance along with lateral and bi-planar fluoroscopic views.  After negative aspirate for air, blood, and CSF, a 2 ml. volume of Isovue-250 was injected into the epidural space and the flow of contrast was observed. Radiographs were obtained for documentation purposes.    The injectate was administered into the level noted above.   Additional Comments:  The patient tolerated the procedure well Dressing: 2 x 2 sterile gauze and Band-Aid    Post-procedure details: Patient was observed during the procedure. Post-procedure instructions were reviewed.  Patient left the clinic in stable condition.

## 2019-07-16 NOTE — Progress Notes (Signed)
Lindsey Mullins - 73 y.o. female MRN [id number]  Date of birth: [birth year]  Office Visit Note: Visit Date: 06/09/2019 PCP: [name], MD Referred by: [name], MD  Subjective: Chief Complaint  Patient presents with  . Lower Back - Pain   HPI:  Lindsey Mullins is a 73 y.o. female who comes in today At the request of Dr. Eduard Roux for right L4-5 interlaminar epidural steroid injection.  He has been following her from an orthopedic standpoint with her pain complaints of low back pain that started many months ago worse with walking and standing.  She does get some relief with Aleve.  She has had no prior spine surgery.  He did obtain MRI of the lumbar spine which is reviewed with the patient reviewed below.  She has multifactorial pretty severe osteoarthritis at L4-5 with listhesis but without much in the way of central canal stenosis.  There is lateral recess narrowing.  She does get some referral in the buttocks and hips.  Her case is complicated by metastatic squamous cell lung cancer.  She is undergoing treatment for this.  Depending on relief would look at facet joint blocks.  ROS Otherwise per HPI.  Assessment & Plan: Visit Diagnoses:  1. Radiculopathy due to lumbar intervertebral disc disorder   2. Compression fracture of L2 vertebra, sequela   3. Metastatic cancer ([place])     Plan: No additional findings.   Meds & Orders:  Meds ordered this encounter  Medications  . methylPREDNISolone acetate (DEPO-MEDROL) injection 80 mg    Orders Placed This Encounter  Procedures  . XR C-ARM NO REPORT  . Epidural Steroid injection    Follow-up: Return if symptoms worsen or fail to improve, for Consider facet joint block.   Procedures: No procedures performed  Lumbar Epidural Steroid Injection - Interlaminar Approach with Fluoroscopic Guidance  Patient: Lindsey Mullins      Date of Birth: 01/29/1946 MRN: [id number] PCP: [name], MD      Visit Date:  06/09/2019   Universal Protocol:     Consent Given By: the patient  Position: PRONE  Additional Comments: Vital signs were monitored before and after the procedure. Patient was prepped and draped in the usual sterile fashion. The correct patient, procedure, and site was verified.   Injection Procedure Details:  Procedure Site One Meds Administered:  Meds ordered this encounter  Medications  . methylPREDNISolone acetate (DEPO-MEDROL) injection 80 mg     Laterality: Right  Location/Site:  L4-L5  Needle size: 20 G  Needle type: Tuohy  Needle Placement: Paramedian epidural  Findings:   -Comments: Excellent flow of contrast into the epidural space.  Procedure Details: Using a paramedian approach from the side mentioned above, the region overlying the inferior lamina was localized under fluoroscopic visualization and the soft tissues overlying this structure were infiltrated with 4 ml. of 1% Lidocaine without Epinephrine. The Tuohy needle was inserted into the epidural space using a paramedian approach.   The epidural space was localized using loss of resistance along with lateral and bi-planar fluoroscopic views.  After negative aspirate for air, blood, and CSF, a 2 ml. volume of Isovue-250 was injected into the epidural space and the flow of contrast was observed. Radiographs were obtained for documentation purposes.    The injectate was administered into the level noted above.   Additional Comments:  The patient tolerated the procedure well Dressing: 2 x 2 sterile gauze and Band-Aid    Post-procedure  details: Patient was observed during the procedure. Post-procedure instructions were reviewed.  Patient left the clinic in stable condition.   Clinical History: MRI LUMBAR SPINE WITHOUT CONTRAST  TECHNIQUE: Multiplanar, multisequence MR imaging of the lumbar spine was performed. No intravenous contrast was administered.  COMPARISON:  CT lumbar spine 03/30/2019  and CT abdomen/pelvis 12/22/2018  FINDINGS: Segmentation: There are five lumbar type vertebral bodies. The last full intervertebral disc space is labeled L5-S1. This correlates with the recent CT scan.  Alignment:  Mild degenerative anterolisthesis of L4.  Vertebrae: Multiple bone lesions are demonstrated involving the L1, L2, L3 and L4 vertebral bodies. There is a pathologic fracture of L2 with minimal compression. Smaller lesions are suspected and L5 and S1. A right iliac bone lesion is also noted. Even in retrospect these are not readily identified on the recent CT scan. Possibilities would include metastatic disease and multiple myeloma. Looking back at the prior abdominal and pelvic CT scan from February I do not see any obvious primary lesion.  Conus medullaris and cauda equina: Conus extends to the T12-L1 level. Conus and cauda equina appear normal.  Paraspinal and other soft tissues: No paraspinal tumor or adenopathy is identified.  Disc levels:  L1-2: No disc protrusions, spinal or foraminal stenosis. The canal is quite generous. Incidental lipoma of the filum terminalis.  L2-3: No significant findings.  L3-4: No significant findings.  L4-5: Degenerative anterolisthesis of L4 with a bulging uncovered disc with mild flattening of the ventral thecal sac and mild bilateral lateral recess encroachment. There is moderate to advanced facet disease. Mild left-sided foraminal narrowing is also noted.  L5-S1: Moderate facet disease but no disc protrusions, spinal or foraminal stenosis.  IMPRESSION: 1. Numerous bone lesions involving the lumbar spine and right iliac bone worrisome for metastatic disease or multiple myeloma. No obvious primary lesion is seen on the recent CT scan from February 2020. Patient needs a metastatic workup. Recommend oncology consultation. 2. Pathologic fracture of L2 with minimal compression. 3. Degenerative anterolisthesis of L4  with a bulging uncovered disc at L4-5. This in combination with advanced facet disease contributes to mild bilateral lateral recess encroachment and mild left foraminal narrowing.  These results will be called to the ordering clinician or representative by the Radiologist Assistant, and communication documented in the PACS or zVision Dashboard.   Electronically Signed   By: Marijo Sanes M.D.   On: 04/15/2019 18:01     Objective:  VS:  HT:    WT:   BMI:     BP:(!) 159/86  HR:68bpm  TEMP: ( )  RESP:  Physical Exam  Ortho Exam Imaging: No results found.

## 2019-07-16 NOTE — Progress Notes (Signed)
Lindsey Mullins - 74 y.o. female MRN [id number]  Date of birth: July 15, 1946  Office Visit Note: Visit Date: 06/30/2019 PCP: [name], MD Referred by: [name], MD  Subjective: Chief Complaint  Patient presents with  . Lower Back - Pain   HPI:  Lindsey Mullins is a 73 y.o. female who comes in today Returns today after having had interlaminar epidural steroid injection at L4-5 without much relief.  Patient second planes are mainly in the low back pain worse with standing and ambulating some referral into the thigh.  She does have some paresthesia but it is more foot.  She has had chemotherapy for her lung cancer.  She states she is undergoing this at this time.  She reports some relief with the epidural injection but only really for a day or so.  I do think she has mainly arthritic pain of the facet joints which is quite severe with listhesis.  Were going to complete facet joint blocks.  If she does well with that would look at radiofrequency ablation for more long-term relief.  ROS Otherwise per HPI.  Assessment & Plan: Visit Diagnoses:  1. Spondylosis without myelopathy or radiculopathy, lumbar region     Plan: No additional findings.   Meds & Orders:  Meds ordered this encounter  Medications  . methylPREDNISolone acetate (DEPO-MEDROL) injection 80 mg    Orders Placed This Encounter  Procedures  . Facet Injection  . XR C-ARM NO REPORT    Follow-up: Return if symptoms worsen or fail to improve, for If back pain really more than 50% would look at second block and radiofrequency ablation..   Procedures: No procedures performed  Lumbar Facet Joint Intra-Articular Injection(s) with Fluoroscopic Guidance  Patient: Lindsey Mullins      Date of Birth: 03/12/1946 MRN: [id number] PCP: [name], MD      Visit Date: 06/30/2019   Universal Protocol:    Date/Time: 06/30/2019  Consent Given By: the patient  Position: PRONE   Additional Comments: Vital  signs were monitored before and after the procedure. Patient was prepped and draped in the usual sterile fashion. The correct patient, procedure, and site was verified.   Injection Procedure Details:  Procedure Site One Meds Administered:  Meds ordered this encounter  Medications  . methylPREDNISolone acetate (DEPO-MEDROL) injection 80 mg     Laterality: Bilateral  Location/Site:  L4-L5  Needle size: 22 guage  Needle type: Spinal  Needle Placement: Articular  Findings:  -Comments: Excellent flow of contrast producing a partial arthrogram.  Procedure Details: The fluoroscope beam is vertically oriented in AP, and the inferior recess is visualized beneath the lower pole of the inferior apophyseal process, which represents the target point for needle insertion. When direct visualization is difficult the target point is located at the medial projection of the vertebral pedicle. The region overlying each aforementioned target is locally anesthetized with a 1 to 2 ml. volume of 1% Lidocaine without Epinephrine.   The spinal needle was inserted into each of the above mentioned facet joints using biplanar fluoroscopic guidance. A 0.25 to 0.5 ml. volume of Isovue-250 was injected and a partial facet joint arthrogram was obtained. A single spot film was obtained of the resulting arthrogram.    One to 1.25 ml of the steroid/anesthetic solution was then injected into each of the facet joints noted above.   Additional Comments:  The patient tolerated the procedure well Dressing: 2 x 2 sterile gauze and Band-Aid  Post-procedure details: Patient was observed during the procedure. Post-procedure instructions were reviewed.  Patient left the clinic in stable condition.    Clinical History: MRI LUMBAR SPINE WITHOUT CONTRAST  TECHNIQUE: Multiplanar, multisequence MR imaging of the lumbar spine was performed. No intravenous contrast was administered.  COMPARISON:  CT lumbar spine  03/30/2019 and CT abdomen/pelvis 12/22/2018  FINDINGS: Segmentation: There are five lumbar type vertebral bodies. The last full intervertebral disc space is labeled L5-S1. This correlates with the recent CT scan.  Alignment:  Mild degenerative anterolisthesis of L4.  Vertebrae: Multiple bone lesions are demonstrated involving the L1, L2, L3 and L4 vertebral bodies. There is a pathologic fracture of L2 with minimal compression. Smaller lesions are suspected and L5 and S1. A right iliac bone lesion is also noted. Even in retrospect these are not readily identified on the recent CT scan. Possibilities would include metastatic disease and multiple myeloma. Looking back at the prior abdominal and pelvic CT scan from February I do not see any obvious primary lesion.  Conus medullaris and cauda equina: Conus extends to the T12-L1 level. Conus and cauda equina appear normal.  Paraspinal and other soft tissues: No paraspinal tumor or adenopathy is identified.  Disc levels:  L1-2: No disc protrusions, spinal or foraminal stenosis. The canal is quite generous. Incidental lipoma of the filum terminalis.  L2-3: No significant findings.  L3-4: No significant findings.  L4-5: Degenerative anterolisthesis of L4 with a bulging uncovered disc with mild flattening of the ventral thecal sac and mild bilateral lateral recess encroachment. There is moderate to advanced facet disease. Mild left-sided foraminal narrowing is also noted.  L5-S1: Moderate facet disease but no disc protrusions, spinal or foraminal stenosis.  IMPRESSION: 1. Numerous bone lesions involving the lumbar spine and right iliac bone worrisome for metastatic disease or multiple myeloma. No obvious primary lesion is seen on the recent CT scan from February 2020. Patient needs a metastatic workup. Recommend oncology consultation. 2. Pathologic fracture of L2 with minimal compression. 3. Degenerative  anterolisthesis of L4 with a bulging uncovered disc at L4-5. This in combination with advanced facet disease contributes to mild bilateral lateral recess encroachment and mild left foraminal narrowing.  These results will be called to the ordering clinician or representative by the Radiologist Assistant, and communication documented in the PACS or zVision Dashboard.   Electronically Signed   By: Marijo Sanes M.D.   On: 04/15/2019 18:01     Objective:  VS:  HT:    WT:   BMI:     BP:136/82  HR:75bpm  TEMP: ( )  RESP:  Physical Exam  Ortho Exam Imaging: No results found.

## 2019-07-24 ENCOUNTER — Encounter: Payer: Self-pay | Admitting: Family Medicine

## 2019-07-24 ENCOUNTER — Ambulatory Visit (INDEPENDENT_AMBULATORY_CARE_PROVIDER_SITE_OTHER): Payer: Medicare Other | Admitting: Family Medicine

## 2019-07-24 ENCOUNTER — Other Ambulatory Visit: Payer: Self-pay

## 2019-07-24 VITALS — BP 110/62 | HR 88 | Temp 98.2°F | Resp 18 | Ht 66.0 in | Wt 166.0 lb

## 2019-07-24 DIAGNOSIS — Z1159 Encounter for screening for other viral diseases: Secondary | ICD-10-CM

## 2019-07-24 DIAGNOSIS — Z0001 Encounter for general adult medical examination with abnormal findings: Secondary | ICD-10-CM

## 2019-07-24 DIAGNOSIS — I10 Essential (primary) hypertension: Secondary | ICD-10-CM

## 2019-07-24 DIAGNOSIS — E782 Mixed hyperlipidemia: Secondary | ICD-10-CM | POA: Diagnosis not present

## 2019-07-24 DIAGNOSIS — C3491 Malignant neoplasm of unspecified part of right bronchus or lung: Secondary | ICD-10-CM

## 2019-07-24 DIAGNOSIS — Z0000 Encounter for general adult medical examination without abnormal findings: Secondary | ICD-10-CM

## 2019-07-24 MED ORDER — GABAPENTIN 100 MG PO CAPS: 200.0000 mg | ORAL_CAPSULE | Freq: Every day | ORAL | 1 refills | Status: DC

## 2019-07-24 NOTE — Patient Instructions (Addendum)
Take 2 of the gabapentin 100mg   Keep check on your blood pressure Do not take muscle relaxers with the gabapentin F/U 4 months

## 2019-07-24 NOTE — Progress Notes (Signed)
Subjective:   Patient presents for Medicare Annual/Subsequent preventive examination.      She is currently followed by oncology for metastatic lung cancer    Legs and feet tingle/numb worse at night has chronic back pain   Given gabapentin 100mg   2 weeks ago  By oncology it helps a little, wants to try a stronger dose  can not tolerate the pain meds, they make her feel sick   She now has an aide to help with her mother during the day   HTN- coreg and losartan 25mg  once aday   Hyperlipidemia- taking crestor  And omega red   Constipation- taking miralax, senna kot at night      Review Past Medical/Family/Social: per EMR    Risk Factors  Current exercise habits:  None Dietary issues discussed:Increasing amount she is eating to get her    sheis drinking high protein ensure    Cardiac risk factors: atherersclerosis   Depression Screen  (Note: if answer to either of the following is "Yes", a more complete depression screening is indicated)  Over the past two weeks, have you felt down, depressed or hopeless? No Over the past two weeks, have you felt little interest or pleasure in doing things? No Have you lost interest or pleasure in daily life? No Do you often feel hopeless? No Do you cry easily over simple problems? No   Activities of Daily Living  In your present state of health, do you have any difficulty performing the following activities?:  Driving? No  Managing money? No  Feeding yourself? No  Getting from bed to chair? No  Climbing a flight of stairs? No  Preparing food and eating?: No  Bathing or showering? No  Getting dressed: No  Getting to the toilet? No  Using the toilet:No  Moving around from place to place: No  In the past year have you fallen or had a near fall?:No  Are you sexually active? No  Do you have more than one partner? No   Hearing Difficulties: No  Do you often ask people to speak up or repeat themselves? No  Do you experience ringing or  noises in your ears? No Do you have difficulty understanding soft or whispered voices? No  Do you feel that you have a problem with memory? No Do you often misplace items? No  Do you feel safe at home? Yes  Cognitive Testing  Alert? Yes Normal Appearance?Yes  Oriented to person? Yes Place? Yes  Time? Yes  Recall of three objects? Yes  Can perform simple calculations? Yes  Displays appropriate judgment?Yes  Can read the correct time from a watch face?Yes   List the Names of Other Physician/Practitioners you currently use:  Oncology, cardiology, orthopedics   Screening Tests / Date Colonoscopy  DUE                     Mammogram UTD Influenza Vaccine - due Tetanus/tdap UTD PNEUMONIA- UTD  Ros: GEN- denies fatigue, fever, weight loss,weakness, recent illness HEENT- denies eye drainage, change in vision, nasal discharge, CVS- denies chest pain, palpitations RESP- denies SOB, cough, wheeze ABD- denies N/V, change in stools, abd pain GU- denies dysuria, hematuria, dribbling, incontinence MSK- + joint pain, muscle aches, injury Neuro- denies headache, dizziness, syncope, seizure activity  Physical: Vitals reviewed   GEN- NAD, alert and oriented x3 HEENT- PERRL, EOMI, non injected sclera, pink conjunctiva, MMM, oropharynx clear Neck- Supple, no thryomegaly CVS- RRR, no murmur RESP-CTAB ABD-NABS,soft,NT,ND EXT-  No edema Pulses- Radial, DP- 2+    Assessment:    Annual wellness medicare exam   Plan:    During the course of the visit the patient was educated and counseled about appropriate screening and preventive services including:   Complete physical exam done.  I reviewed her recent labs with oncology we are getting a lipid panel today as well as her hepatitis C screening.  Regarding immunizations will defer to oncology she is in the middle of her chemotherapy treatments.  She will speak with them next week and see if she can proceed with her flu shot.  For other  cancer screening she currently has metastatic lung cancer I think we can hold on mammogram and colonoscopy and see how she progresses.  She does not want any unnecessary work-up at this time she is going through treatments which is very reasonable at her age.  Hypertension blood pressure is controlled we will have her monitor at home if she does start to have hypotensive episodes in the setting of her cancer and treatments recommend that she discontinue the losartan altogether.  Hyperlipidemia atherosclerosis she is tolerating statin drug and she is also on Krill oil will check lipid panel   Peripheral neuropathy in the setting of chemotherapy as well as chronic back pain.  She will try gabapentin 200 mg at bedtime I have refilled this for her.  If this does make her more somnolent dizzy she will go back to the 100 mg     Patient Instructions (the written plan) was given to the patient.  Medicare Attestation  I have personally reviewed:  The patient's medical and social history  Their use of alcohol, tobacco or illicit drugs  Their current medications and supplements  The patient's functional ability including ADLs,fall risks, home safety risks, cognitive, and hearing and visual impairment  Diet and physical activities  Evidence for depression or mood disorders  The patient's weight, height, BMI, and visual acuity have been recorded in the chart. I have made referrals, counseling, and provided education to the patient based on review of the above and I have provided the patient with a written personalized care plan for preventive services.

## 2019-07-28 ENCOUNTER — Other Ambulatory Visit: Payer: Self-pay

## 2019-07-28 ENCOUNTER — Encounter (HOSPITAL_COMMUNITY): Payer: Self-pay | Admitting: Hematology

## 2019-07-28 ENCOUNTER — Inpatient Hospital Stay (HOSPITAL_BASED_OUTPATIENT_CLINIC_OR_DEPARTMENT_OTHER): Payer: Medicare Other | Admitting: Hematology

## 2019-07-28 ENCOUNTER — Inpatient Hospital Stay (HOSPITAL_COMMUNITY): Payer: Medicare Other

## 2019-07-28 ENCOUNTER — Inpatient Hospital Stay (HOSPITAL_COMMUNITY): Payer: Medicare Other | Attending: Hematology

## 2019-07-28 VITALS — BP 93/52 | HR 87 | Temp 97.1°F | Resp 18 | Wt 166.0 lb

## 2019-07-28 VITALS — BP 120/71 | HR 94 | Temp 96.5°F | Resp 18

## 2019-07-28 DIAGNOSIS — M545 Low back pain: Secondary | ICD-10-CM | POA: Insufficient documentation

## 2019-07-28 DIAGNOSIS — C799 Secondary malignant neoplasm of unspecified site: Secondary | ICD-10-CM

## 2019-07-28 DIAGNOSIS — C3491 Malignant neoplasm of unspecified part of right bronchus or lung: Secondary | ICD-10-CM | POA: Diagnosis not present

## 2019-07-28 DIAGNOSIS — K219 Gastro-esophageal reflux disease without esophagitis: Secondary | ICD-10-CM | POA: Diagnosis not present

## 2019-07-28 DIAGNOSIS — Z7689 Persons encountering health services in other specified circumstances: Secondary | ICD-10-CM | POA: Insufficient documentation

## 2019-07-28 DIAGNOSIS — K5900 Constipation, unspecified: Secondary | ICD-10-CM | POA: Diagnosis not present

## 2019-07-28 DIAGNOSIS — M8580 Other specified disorders of bone density and structure, unspecified site: Secondary | ICD-10-CM | POA: Diagnosis not present

## 2019-07-28 DIAGNOSIS — E785 Hyperlipidemia, unspecified: Secondary | ICD-10-CM | POA: Insufficient documentation

## 2019-07-28 DIAGNOSIS — R634 Abnormal weight loss: Secondary | ICD-10-CM | POA: Insufficient documentation

## 2019-07-28 DIAGNOSIS — M79606 Pain in leg, unspecified: Secondary | ICD-10-CM | POA: Diagnosis not present

## 2019-07-28 DIAGNOSIS — I10 Essential (primary) hypertension: Secondary | ICD-10-CM | POA: Insufficient documentation

## 2019-07-28 DIAGNOSIS — C7951 Secondary malignant neoplasm of bone: Secondary | ICD-10-CM | POA: Diagnosis not present

## 2019-07-28 DIAGNOSIS — Z79899 Other long term (current) drug therapy: Secondary | ICD-10-CM | POA: Insufficient documentation

## 2019-07-28 DIAGNOSIS — G893 Neoplasm related pain (acute) (chronic): Secondary | ICD-10-CM | POA: Insufficient documentation

## 2019-07-28 DIAGNOSIS — Z7982 Long term (current) use of aspirin: Secondary | ICD-10-CM | POA: Insufficient documentation

## 2019-07-28 DIAGNOSIS — Z5111 Encounter for antineoplastic chemotherapy: Secondary | ICD-10-CM | POA: Diagnosis not present

## 2019-07-28 LAB — COMPREHENSIVE METABOLIC PANEL
ALT: 15 U/L (ref 0–44)
AST: 22 U/L (ref 15–41)
Albumin: 3.7 g/dL (ref 3.5–5.0)
Alkaline Phosphatase: 147 U/L — ABNORMAL HIGH (ref 38–126)
Anion gap: 9 (ref 5–15)
BUN: 8 mg/dL (ref 8–23)
CO2: 23 mmol/L (ref 22–32)
Calcium: 8.8 mg/dL — ABNORMAL LOW (ref 8.9–10.3)
Chloride: 108 mmol/L (ref 98–111)
Creatinine, Ser: 0.64 mg/dL (ref 0.44–1.00)
GFR calc Af Amer: >60 mL/min (ref >60)
GFR calc non Af Amer: >60 mL/min (ref >60)
Glucose, Bld: 106 mg/dL — ABNORMAL HIGH (ref 70–99)
Potassium: 3.3 mmol/L — ABNORMAL LOW (ref 3.5–5.1)
Sodium: 140 mmol/L (ref 135–145)
Total Bilirubin: 0.4 mg/dL (ref 0.3–1.2)
Total Protein: 6.8 g/dL (ref 6.5–8.1)

## 2019-07-28 LAB — CBC WITH DIFFERENTIAL/PLATELET
Abs Immature Granulocytes: 0.02 10*3/uL (ref 0.00–0.07)
Basophils Absolute: 0.1 10*3/uL (ref 0.0–0.1)
Basophils Relative: 1 %
Eosinophils Absolute: 0.5 10*3/uL (ref 0.0–0.5)
Eosinophils Relative: 7 %
HCT: 29 % — ABNORMAL LOW (ref 36.0–46.0)
Hemoglobin: 9.1 g/dL — ABNORMAL LOW (ref 12.0–15.0)
Immature Granulocytes: 0 %
Lymphocytes Relative: 23 %
Lymphs Abs: 1.5 10*3/uL (ref 0.7–4.0)
MCH: 29.7 pg (ref 26.0–34.0)
MCHC: 31.4 g/dL (ref 30.0–36.0)
MCV: 94.8 fL (ref 80.0–100.0)
Monocytes Absolute: 0.7 10*3/uL (ref 0.1–1.0)
Monocytes Relative: 11 %
Neutro Abs: 3.6 10*3/uL (ref 1.7–7.7)
Neutrophils Relative %: 58 %
Platelets: 281 10*3/uL (ref 150–400)
RBC: 3.06 MIL/uL — ABNORMAL LOW (ref 3.87–5.11)
RDW: 17.8 % — ABNORMAL HIGH (ref 11.5–15.5)
WBC: 6.3 10*3/uL (ref 4.0–10.5)
nRBC: 0 % (ref 0.0–0.2)

## 2019-07-28 LAB — HEPATITIS C ANTIBODY
Hepatitis C Ab: NONREACTIVE
SIGNAL TO CUT-OFF: 0.04 (ref <1.00)

## 2019-07-28 LAB — LIPID PANEL
Cholesterol: 185 mg/dL (ref <200)
HDL: 36 mg/dL — ABNORMAL LOW (ref >=50)
LDL Cholesterol (Calc): 126 mg/dL (calc) — ABNORMAL HIGH
Non-HDL Cholesterol (Calc): 149 mg/dL (calc) — ABNORMAL HIGH (ref <130)
Total CHOL/HDL Ratio: 5.1 (calc) — ABNORMAL HIGH (ref <5.0)
Triglycerides: 123 mg/dL (ref <150)

## 2019-07-28 MED ORDER — PALONOSETRON HCL INJECTION 0.25 MG/5ML
0.2500 mg | Freq: Once | INTRAVENOUS | Status: AC
  Administered 2019-07-28: 10:00:00 0.25 mg via INTRAVENOUS
  Filled 2019-07-28: qty 5

## 2019-07-28 MED ORDER — SODIUM CHLORIDE 0.9 % IV SOLN
438.0000 mg | Freq: Once | INTRAVENOUS | Status: AC
  Administered 2019-07-28: 15:00:00 440 mg via INTRAVENOUS
  Filled 2019-07-28: qty 44

## 2019-07-28 MED ORDER — FAMOTIDINE IN NACL 20-0.9 MG/50ML-% IV SOLN
20.0000 mg | Freq: Once | INTRAVENOUS | Status: AC
  Administered 2019-07-28: 10:00:00 20 mg via INTRAVENOUS
  Filled 2019-07-28: qty 50

## 2019-07-28 MED ORDER — SODIUM CHLORIDE 0.9% FLUSH
10.0000 mL | INTRAVENOUS | Status: DC | PRN
  Administered 2019-07-28: 09:00:00 10 mL
  Filled 2019-07-28: qty 10

## 2019-07-28 MED ORDER — SODIUM CHLORIDE 0.9 % IV SOLN
175.0000 mg/m2 | Freq: Once | INTRAVENOUS | Status: AC
  Administered 2019-07-28: 336 mg via INTRAVENOUS
  Filled 2019-07-28: qty 56

## 2019-07-28 MED ORDER — SODIUM CHLORIDE 0.9 % IV SOLN
Freq: Once | INTRAVENOUS | Status: AC
  Administered 2019-07-28: 10:00:00 via INTRAVENOUS

## 2019-07-28 MED ORDER — HEPARIN SOD (PORK) LOCK FLUSH 100 UNIT/ML IV SOLN
500.0000 [IU] | Freq: Once | INTRAVENOUS | Status: AC | PRN
  Administered 2019-07-28: 500 [IU]

## 2019-07-28 MED ORDER — POTASSIUM CHLORIDE CRYS ER 20 MEQ PO TBCR
20.0000 meq | EXTENDED_RELEASE_TABLET | Freq: Once | ORAL | Status: AC
  Administered 2019-07-28: 10:00:00 20 meq via ORAL
  Filled 2019-07-28: qty 1

## 2019-07-28 MED ORDER — SODIUM CHLORIDE 0.9 % IV SOLN
200.0000 mg | Freq: Once | INTRAVENOUS | Status: AC
  Administered 2019-07-28: 200 mg via INTRAVENOUS
  Filled 2019-07-28: qty 8

## 2019-07-28 MED ORDER — DIPHENHYDRAMINE HCL 50 MG/ML IJ SOLN
50.0000 mg | Freq: Once | INTRAMUSCULAR | Status: AC
  Administered 2019-07-28: 10:00:00 50 mg via INTRAVENOUS
  Filled 2019-07-28: qty 1

## 2019-07-28 MED ORDER — SODIUM CHLORIDE 0.9 % IV SOLN
Freq: Once | INTRAVENOUS | Status: AC
  Administered 2019-07-28: 10:00:00 via INTRAVENOUS
  Filled 2019-07-28: qty 5

## 2019-07-28 NOTE — Progress Notes (Signed)
[place], [employer] [address]   CLINIC:  Medical Oncology/Hematology  PCP:  [name], MD [address] [place] [address] [phone]   REASON FOR VISIT: Metastatic squamous cell lung cancer.   INTERVAL HISTORY:  Ms. Diliberto 73 y.o. female seen for follow-up and cycle 3 of chemotherapy for metastatic lung cancer.  She tolerated cycle 2 reasonably well.  She gained about 4 pounds in the last 2 weeks.  Appetite is 50%.  Energy levels are 75%.  No pain is reported.  She had pain in the legs from paclitaxel.  She started taking gabapentin 100 mg which was later increased to 200 mg by Dr. Buelah Manis.  Her appetite is picked up.  REVIEW OF SYSTEMS:  Review of Systems  Neurological: Positive for numbness.  Psychiatric/Behavioral: Positive for sleep disturbance.  All other systems reviewed and are negative.    PAST MEDICAL/SURGICAL HISTORY:  Past Medical History:  Diagnosis Date  . Atypical chest pain 05/23/2012   STRESS TEST - small to moderate sized area of partial reversibility of the anteroapical wall, most likely breast artifact, post-stress EF 67%, EKG show NSR at 65, No Lexiscan EKG changes, non-diagnostic for ischemia; STRESS TEST, 01/25/2010 - normal study, post-stress EF 66%, no significant ischemia  . Cancer ([place])   . Cerebral atherosclerosis 09/29/2012   CAROTID DUPLEX - RIGHT  BULB/PROXIMAL ICA-moderate amount of fibrous plaque, 50-69% diameter reduction; LEFT CEA-normal, no significant diameter reduction  . Colitis   . GERD (gastroesophageal reflux disease)   . Hyperlipidemia   . Hypertension   . Osteopenia   . Restless leg syndrome   . Shortness of breath 03/30/2005   2D ECHO - EF >55%, normal  . TIA (transient ischemic attack) 01/27/2010   2D ECHO - EF 65%, normal   Past Surgical History:  Procedure Laterality Date  . ABDOMINAL EXPLORATION SURGERY    . CAROTID ENDARTERECTOMY    . LYMPH NODE BIOPSY N/A  05/20/2019   Procedure: LYMPH NODE BIOPSY CERVICAL;  Surgeon: Aviva Signs, MD;  Location: [facility];  Service: General;  Laterality: N/A;  . Peripheral Vascular Catheterization  07/16/2005   Right verterbral-50% smooth segmental badsilar artery stenosis on right side, Right carotid-50% ulcerated proxmial stenosis, Left carotid-60 to 70% left externam carotid artery stenosis, 90% proximal left ICA stenosis  . PORTACATH PLACEMENT Left 06/12/2019   Procedure: INSERTION PORT-A-CATH (catheter attached left subclavian);  Surgeon: Aviva Signs, MD;  Location: [facility];  Service: General;  Laterality: Left;     SOCIAL HISTORY:  Social History   Socioeconomic History  . Marital status: Divorced    Spouse name: Not on file  . Number of children: 2  . Years of education: Not on file  . Highest education level: Not on file  Occupational History  . Not on file  Social Needs  . Financial resource strain: Not hard at all  . Food insecurity    Worry: Never true    Inability: Never true  . Transportation needs    Medical: No    Non-medical: No  Tobacco Use  . Smoking status: Former Smoker    Packs/day: 0.25    Years: 10.00    Pack years: 2.50    Types: Cigarettes    Quit date: 04/21/1983    Years since quitting: 36.2  . Smokeless tobacco: Never Used  Substance and Sexual Activity  . Alcohol use: No  . Drug use: No  .  Sexual activity: Not Currently  Lifestyle  . Physical activity    Days per week: 0 days    Minutes per session: 0 min  . Stress: Not at all  Relationships  . Social Herbalist on phone: Twice a week    Gets together: Twice a week    Attends religious service: More than 4 times per year    Active member of club or organization: No    Attends meetings of clubs or organizations: Never    Relationship status: Divorced  . Intimate partner violence    Fear of current or ex partner: No    Emotionally abused: No    Physically abused: No    Forced sexual activity: No   Other Topics Concern  . Not on file  Social History Narrative  . Not on file    FAMILY HISTORY:  Family History  Problem Relation Age of Onset  . Heart disease Mother   . Hypertension Mother   . Hypertension Father   . Heart disease Father     CURRENT MEDICATIONS:  Outpatient Encounter Medications as of 07/28/2019  Medication Sig Note  . aspirin 81 MG tablet Take 81 mg by mouth daily.    . carvedilol (COREG) 3.125 MG tablet TAKE 1 TABLET BY MOUTH ONCE DAILY (Patient taking differently: Take 3.125 mg by mouth daily. )   . gabapentin (NEURONTIN) 100 MG capsule Take 2 capsules (200 mg total) by mouth at bedtime.   [name] loratadine (CLARITIN) 10 MG tablet Take 10 mg by mouth daily as needed for allergies or itching.    . losartan (COZAAR) 50 MG tablet Take 1/2 (one-half) tablet by mouth once daily   . Omega-3 Krill Oil 500 MG CAPS Take 500 mg by mouth daily.    [name] omeprazole (PRILOSEC) 20 MG capsule Take 1 po daily (Patient taking differently: Take 20 mg by mouth daily as needed (acid reflux). )   . polyethylene glycol powder (GLYCOLAX/MIRALAX) 17 GM/SCOOP powder Take 17 g by mouth 2 (two) times daily. Until daily soft stools  OTC   . rosuvastatin (CRESTOR) 20 MG tablet Take 1 tablet (20 mg total) by mouth daily. (Patient taking differently: Take 10 mg by mouth 2 (two) times daily as needed (cholesterol). )   . senna (SENOKOT) 8.6 MG TABS tablet Take 2 tablets by mouth daily.   . cyclobenzaprine (FLEXERIL) 5 MG tablet TAKE 1 TABLET BY MOUTH AT BEDTIME AS NEEDED FOR MUSCLE SPASM OR  LEG  CRAMPS (Patient not taking: No sig reported)   . HYDROmorphone (DILAUDID) 2 MG tablet Take 0.5 tablets (1 mg total) by mouth every 6 (six) hours as needed for severe pain. (Patient not taking: Reported on 07/28/2019) 07/24/2019: prn  . Lactulose 20 GM/30ML SOLN Lactulose 10gm.  Take 41ml to 38ml three times a day to assist with moving bowels.  Titrate down if having multiple bowel movements.  Call if no results  after taking the medication three times a day. (Patient not taking: Reported on 07/28/2019) 06/18/2019: Haven't started yet  . naproxen sodium (ALEVE) 220 MG tablet Take 220 mg by mouth 2 (two) times daily as needed (pain).    . nitroGLYCERIN (NITROSTAT) 0.4 MG SL tablet DISSOLVE ONE TABLET UNDER THE TONGUE AS NEEDED FOR CHEST PAIN**MAX 3 TABLETS EACH 5 MINUTES APART** (Patient not taking: Reported on 07/28/2019)   . tiZANidine (ZANAFLEX) 4 MG tablet Take 4 mg by mouth daily as needed for muscle spasms.  No facility-administered encounter medications on file as of 07/28/2019.     ALLERGIES:  Allergies  Allergen Reactions  . Penicillins Shortness Of Breath and Other (See Comments)    Has patient had a PCN reaction causing immediate rash, facial/tongue/throat swelling, SOB or lightheadedness with hypotension: Yes Has patient had a PCN reaction causing severe rash involving mucus membranes or skin necrosis: Unk Has patient had a PCN reaction that required hospitalization: Unk Has patient had a PCN reaction occurring within the last 10 years: Yes "States that she was in ICU after being administered"   . Codeine Nausea And Vomiting  . Lipitor [Atorvastatin] Other (See Comments)    Myalgias  . Lisinopril Cough  . Pravastatin Other (See Comments)    Myalgias     PHYSICAL EXAM:  ECOG Performance status: 1  Vitals:   07/28/19 0826  BP: (!) 93/52  Pulse: 87  Resp: 18  Temp: (!) 97.1 F (36.2 C)  SpO2: 100%   Filed Weights   07/28/19 0826  Weight: 166 lb (75.3 kg)    Physical Exam Constitutional:      Appearance: Normal appearance. She is normal weight.  Cardiovascular:     Rate and Rhythm: Normal rate and regular rhythm.     Heart sounds: Normal heart sounds.  Pulmonary:     Effort: Pulmonary effort is normal.     Breath sounds: Normal breath sounds.  Abdominal:     General: Bowel sounds are normal.     Palpations: Abdomen is soft.  Musculoskeletal: Normal range of motion.   Skin:    General: Skin is warm and dry.  Neurological:     Mental Status: She is alert and oriented to person, place, and time. Mental status is at baseline.  Psychiatric:        Mood and Affect: Mood normal.        Behavior: Behavior normal.        Thought Content: Thought content normal.        Judgment: Judgment normal.      LABORATORY DATA:  I have reviewed the labs as listed.  CBC    Component Value Date/Time   WBC 6.3 07/28/2019 0837   RBC 3.06 (L) 07/28/2019 0837   HGB 9.1 (L) 07/28/2019 0837   HCT 29.0 (L) 07/28/2019 0837   PLT 281 07/28/2019 0837   MCV 94.8 07/28/2019 0837   MCH 29.7 07/28/2019 0837   MCHC 31.4 07/28/2019 0837   RDW 17.8 (H) 07/28/2019 0837   LYMPHSABS 1.5 07/28/2019 0837   MONOABS 0.7 07/28/2019 0837   EOSABS 0.5 07/28/2019 0837   BASOSABS 0.1 07/28/2019 0837   CMP Latest Ref Rng & Units 07/28/2019 07/14/2019 07/06/2019  Glucose 70 - 99 mg/dL 106(H) 92 78  BUN 8 - 23 mg/dL 8 10 13   Creatinine 0.44 - 1.00 mg/dL 0.64 0.61 0.68  Sodium 135 - 145 mmol/L 140 135 136  Potassium 3.5 - 5.1 mmol/L 3.3(L) 3.4(L) 3.9  Chloride 98 - 111 mmol/L 108 101 104  CO2 22 - 32 mmol/L 23 23 22   Calcium 8.9 - 10.3 mg/dL 8.8(L) 9.2 8.9  Total Protein 6.5 - 8.1 g/dL 6.8 7.5 7.2  Total Bilirubin 0.3 - 1.2 mg/dL 0.4 0.6 0.4  Alkaline Phos 38 - 126 U/L 147(H) 174(H) 175(H)  AST 15 - 41 U/L 22 25 24   ALT 0 - 44 U/L 15 22 23        DIAGNOSTIC IMAGING:  I have independently reviewed the scans and discussed  with the patient.   ASSESSMENT & PLAN:   Squamous cell carcinoma of lung, stage IV, right ([place]) 1.  Metastatic poorly differentiated squamous cell carcinoma of the lung: -PDL 1 TPS-10%, foundation 1 results pending.  Presentation with low back pain. -PET scan on 05/08/2019 showed hypermetabolic right hilar, mediastinal, right supraclavicular adenopathy with solitary small hypermetabolic nodule in the right lung measuring 1 cm.  Focal activity in the right  posterior oropharynx with no CT correlate.  Widespread hypermetabolic skeletal metastasis and solitary splenic metastasis.  MRI of the brain was negative. -Right supraclavicular lymph node biopsy on 05/20/2019 shows metastatic poorly differentiated squamous cell carcinoma. - Cycle 1 carboplatin, paclitaxel and pembrolizumab on 06/15/2019, cycle 2 on 07/06/2019 with dose reduction (AUC 5 and paclitaxel 175 mg/M2). - She has done reasonably well after cycle 2. -We have reviewed her labs.  We will proceed with cycle 3 without any dose changes. -She will be seen back in 3 weeks for follow-up.  I plan to repeat CT CAP prior to next visit.  2.  Weight loss: -She lost some weight during start of cycle 1.  However she gained back 4 pounds in the last 2 weeks.  3.  Taxane-induced leg pains: - She is taking gabapentin 200 mg at bedtime which is helping.  4.  Constipation: -She is continue MiraLAX and stool softener.  5.  Low back pain: -She had second steroid injection on 06/30/2019.  Pain is slightly better.  Total time spent is 25 minutes with more than 50% of the time spent face-to-face discussing treatment plan, counseling and coordination of care.  Orders placed this encounter:  Orders Placed This Encounter  Procedures  . CT Abdomen Pelvis W Contrast  . CT Chest W Contrast  . CBC with Differential/Platelet  . Comprehensive metabolic panel      Derek Jack, MD [place] [phone]

## 2019-07-28 NOTE — Patient Instructions (Addendum)
[place] at [facility] Discharge Instructions  You were seen today by Dr. Delton Coombes. He went over your recent lab results. He will get you scheduled for repeat CT scans to re-evaluate your cancer. He will see you back in 3 weeks for labs, treatment and follow up.   Thank you for choosing [place] at [facility] to provide your oncology and hematology care.  To afford each patient quality time with our provider, please arrive at least 15 minutes before your scheduled appointment time.   If you have a lab appointment with the [facility] please come in thru the  Main Entrance and check in at the main information desk  You need to re-schedule your appointment should you arrive 10 or more minutes late.  We strive to give you quality time with our providers, and arriving late affects you and other patients whose appointments are after yours.  Also, if you no show three or more times for appointments you may be dismissed from the clinic at the providers discretion.     Again, thank you for choosing [facility].  Our hope is that these requests will decrease the amount of time that you wait before being seen by our physicians.       _____________________________________________________________  Should you have questions after your visit to [facility], please contact our office at [phone] between the hours of 8:00 a.m. and 4:30 p.m.  Voicemails left after 4:00 p.m. will not be returned until the following business day.  For prescription refill requests, have your pharmacy contact our office and allow 72 hours.    Cancer Center Support Programs:   > Cancer Support Group  2nd Tuesday of the month 1pm-2pm, Journey Room

## 2019-07-28 NOTE — Patient Instructions (Signed)
[facility] Discharge Instructions for Patients Receiving Chemotherapy  Today you received the following chemotherapy agents   To help prevent nausea and vomiting after your treatment, we encourage you to take your nausea medication   If you develop nausea and vomiting that is not controlled by your nausea medication, call the clinic.   BELOW ARE SYMPTOMS THAT SHOULD BE REPORTED IMMEDIATELY:  *FEVER GREATER THAN 100.5 F  *CHILLS WITH OR WITHOUT FEVER  NAUSEA AND VOMITING THAT IS NOT CONTROLLED WITH YOUR NAUSEA MEDICATION  *UNUSUAL SHORTNESS OF BREATH  *UNUSUAL BRUISING OR BLEEDING  TENDERNESS IN MOUTH AND THROAT WITH OR WITHOUT PRESENCE OF ULCERS  *URINARY PROBLEMS  *BOWEL PROBLEMS  UNUSUAL RASH Items with * indicate a potential emergency and should be followed up as soon as possible.  Feel free to call the clinic should you have any questions or concerns. The clinic phone number is [phone].  Please show the CHEMO ALERT CARD at check-in to the Emergency Department and triage nurse.   

## 2019-07-28 NOTE — Progress Notes (Signed)
Labs reviewed with MD today , proceed with treatment today per MD.

## 2019-07-28 NOTE — Patient Instructions (Signed)
[facility] at [facility] Discharge Instructions  Labs drawn from portacath today   Thank you for choosing [facility] at [facility] to provide your oncology and hematology care.  To afford each patient quality time with our provider, please arrive at least 15 minutes before your scheduled appointment time.   If you have a lab appointment with the Cancer Center please come in thru the Main Entrance and check in at the main information desk.  You need to re-schedule your appointment should you arrive 10 or more minutes late.  We strive to give you quality time with our providers, and arriving late affects you and other patients whose appointments are after yours.  Also, if you no show three or more times for appointments you may be dismissed from the clinic at the providers discretion.     Again, thank you for choosing [facility].  Our hope is that these requests will decrease the amount of time that you wait before being seen by our physicians.       _____________________________________________________________  Should you have questions after your visit to [facility], please contact our office at [phone] between the hours of 8:00 a.m. and 4:30 p.m.  Voicemails left after 4:00 p.m. will not be returned until the following business day.  For prescription refill requests, have your pharmacy contact our office and allow 72 hours.    Due to Covid, you will need to wear a mask upon entering the hospital. If you do not have a mask, a mask will be given to you at the Main Entrance upon arrival. For doctor visits, patients may have 1 support person with them. For treatment visits, patients can not have anyone with them due to social distancing guidelines and our immunocompromised population.     

## 2019-07-28 NOTE — Assessment & Plan Note (Signed)
1.  Metastatic poorly differentiated squamous cell carcinoma of the lung: -PDL 1 TPS-10%, foundation 1 results pending.  Presentation with low back pain. -PET scan on 05/08/2019 showed hypermetabolic right hilar, mediastinal, right supraclavicular adenopathy with solitary small hypermetabolic nodule in the right lung measuring 1 cm.  Focal activity in the right posterior oropharynx with no CT correlate.  Widespread hypermetabolic skeletal metastasis and solitary splenic metastasis.  MRI of the brain was negative. -Right supraclavicular lymph node biopsy on 05/20/2019 shows metastatic poorly differentiated squamous cell carcinoma. - Cycle 1 carboplatin, paclitaxel and pembrolizumab on 06/15/2019, cycle 2 on 07/06/2019 with dose reduction (AUC 5 and paclitaxel 175 mg/M2). - She has done reasonably well after cycle 2. -We have reviewed her labs.  We will proceed with cycle 3 without any dose changes. -She will be seen back in 3 weeks for follow-up.  I plan to repeat CT CAP prior to next visit.  2.  Weight loss: -She lost some weight during start of cycle 1.  However she gained back 4 pounds in the last 2 weeks.  3.  Taxane-induced leg pains: - She is taking gabapentin 200 mg at bedtime which is helping.  4.  Constipation: -She is continue MiraLAX and stool softener.  5.  Low back pain: -She had second steroid injection on 06/30/2019.  Pain is slightly better.

## 2019-07-30 ENCOUNTER — Other Ambulatory Visit: Payer: Self-pay

## 2019-07-30 ENCOUNTER — Inpatient Hospital Stay (HOSPITAL_COMMUNITY): Payer: Medicare Other

## 2019-07-30 VITALS — BP 111/62 | HR 84 | Temp 97.5°F | Resp 18

## 2019-07-30 DIAGNOSIS — C7951 Secondary malignant neoplasm of bone: Secondary | ICD-10-CM | POA: Diagnosis not present

## 2019-07-30 DIAGNOSIS — C799 Secondary malignant neoplasm of unspecified site: Secondary | ICD-10-CM

## 2019-07-30 DIAGNOSIS — C3491 Malignant neoplasm of unspecified part of right bronchus or lung: Secondary | ICD-10-CM

## 2019-07-30 MED ORDER — PEGFILGRASTIM INJECTION 6 MG/0.6ML ~~LOC~~
6.0000 mg | PREFILLED_SYRINGE | Freq: Once | SUBCUTANEOUS | Status: AC
  Administered 2019-07-30: 14:00:00 6 mg via SUBCUTANEOUS
  Filled 2019-07-30: qty 0.6

## 2019-07-30 MED ORDER — TBO-FILGRASTIM 480 MCG/0.8ML ~~LOC~~ SOSY: 480.0000 ug | PREFILLED_SYRINGE | Freq: Once | SUBCUTANEOUS | Status: DC

## 2019-07-30 NOTE — Patient Instructions (Signed)
[facility]  Discharge Instructions:   _______________________________________________________________  Thank you for choosing [facility] at [facility] to provide your oncology and hematology care.  To afford each patient quality time with our providers, please arrive at least 15 minutes before your scheduled appointment.  You need to re-schedule your appointment if you arrive 10 or more minutes late.  We strive to give you quality time with our providers, and arriving late affects you and other patients whose appointments are after yours.  Also, if you no show three or more times for appointments you may be dismissed from the clinic.  Again, thank you for choosing  [facility] at [facility]. Our hope is that these requests will allow you access to exceptional care and in a timely manner. _______________________________________________________________  If you have questions after your visit, please contact our office at [phone] between the hours of 8:30 a.m. and 5:00 p.m. Voicemails left after 4:30 p.m. will not be returned until the following business day. _______________________________________________________________  For prescription refill requests, have your pharmacy contact our office. _______________________________________________________________  Recommendations made by the consultant and any test results will be sent to your referring physician. _______________________________________________________________ 

## 2019-07-30 NOTE — Progress Notes (Signed)
Lindsey Mullins presents today for injection per MD orders. Neulasta 6mg  administered SQ in right Upper Arm. Administration without incident. Patient tolerated well.

## 2019-08-14 ENCOUNTER — Other Ambulatory Visit: Payer: Self-pay

## 2019-08-14 ENCOUNTER — Ambulatory Visit (HOSPITAL_COMMUNITY)
Admission: RE | Admit: 2019-08-14 | Discharge: 2019-08-14 | Disposition: A | Discharge status: Home / Self Care | Payer: Medicare Other | Source: Ambulatory Visit | Attending: Hematology | Admitting: Hematology

## 2019-08-14 DIAGNOSIS — C799 Secondary malignant neoplasm of unspecified site: Secondary | ICD-10-CM | POA: Insufficient documentation

## 2019-08-14 DIAGNOSIS — C3491 Malignant neoplasm of unspecified part of right bronchus or lung: Secondary | ICD-10-CM | POA: Insufficient documentation

## 2019-08-14 MED ORDER — IOHEXOL 300 MG/ML  SOLN
100.0000 mL | Freq: Once | INTRAMUSCULAR | Status: AC | PRN
  Administered 2019-08-14: 100 mL via INTRAVENOUS

## 2019-08-14 MED ORDER — HEPARIN SOD (PORK) LOCK FLUSH 100 UNIT/ML IV SOLN
INTRAVENOUS | Status: AC
  Administered 2019-08-14: 11:00:00
  Filled 2019-08-14: qty 5

## 2019-08-18 ENCOUNTER — Inpatient Hospital Stay (HOSPITAL_BASED_OUTPATIENT_CLINIC_OR_DEPARTMENT_OTHER): Payer: Medicare Other | Admitting: Hematology

## 2019-08-18 ENCOUNTER — Other Ambulatory Visit: Payer: Self-pay

## 2019-08-18 ENCOUNTER — Inpatient Hospital Stay (HOSPITAL_COMMUNITY): Payer: Medicare Other

## 2019-08-18 ENCOUNTER — Encounter (HOSPITAL_COMMUNITY): Payer: Self-pay | Admitting: Hematology

## 2019-08-18 VITALS — BP 146/77 | HR 88 | Temp 96.9°F | Resp 18

## 2019-08-18 DIAGNOSIS — C799 Secondary malignant neoplasm of unspecified site: Secondary | ICD-10-CM

## 2019-08-18 DIAGNOSIS — C3491 Malignant neoplasm of unspecified part of right bronchus or lung: Secondary | ICD-10-CM

## 2019-08-18 DIAGNOSIS — C7951 Secondary malignant neoplasm of bone: Secondary | ICD-10-CM | POA: Diagnosis not present

## 2019-08-18 LAB — COMPREHENSIVE METABOLIC PANEL
ALT: 16 U/L (ref 0–44)
AST: 24 U/L (ref 15–41)
Albumin: 3.6 g/dL (ref 3.5–5.0)
Alkaline Phosphatase: 122 U/L (ref 38–126)
Anion gap: 10 (ref 5–15)
BUN: 10 mg/dL (ref 8–23)
CO2: 25 mmol/L (ref 22–32)
Calcium: 9 mg/dL (ref 8.9–10.3)
Chloride: 106 mmol/L (ref 98–111)
Creatinine, Ser: 0.66 mg/dL (ref 0.44–1.00)
GFR calc Af Amer: >60 mL/min (ref >60)
GFR calc non Af Amer: >60 mL/min (ref >60)
Glucose, Bld: 106 mg/dL — ABNORMAL HIGH (ref 70–99)
Potassium: 3.3 mmol/L — ABNORMAL LOW (ref 3.5–5.1)
Sodium: 141 mmol/L (ref 135–145)
Total Bilirubin: 0.6 mg/dL (ref 0.3–1.2)
Total Protein: 6.5 g/dL (ref 6.5–8.1)

## 2019-08-18 LAB — CBC WITH DIFFERENTIAL/PLATELET
Abs Immature Granulocytes: 0.01 10*3/uL (ref 0.00–0.07)
Basophils Absolute: 0.1 10*3/uL (ref 0.0–0.1)
Basophils Relative: 1 %
Eosinophils Absolute: 0.3 10*3/uL (ref 0.0–0.5)
Eosinophils Relative: 6 %
HCT: 25.9 % — ABNORMAL LOW (ref 36.0–46.0)
Hemoglobin: 8 g/dL — ABNORMAL LOW (ref 12.0–15.0)
Immature Granulocytes: 0 %
Lymphocytes Relative: 26 %
Lymphs Abs: 1.3 10*3/uL (ref 0.7–4.0)
MCH: 30.1 pg (ref 26.0–34.0)
MCHC: 30.9 g/dL (ref 30.0–36.0)
MCV: 97.4 fL (ref 80.0–100.0)
Monocytes Absolute: 0.8 10*3/uL (ref 0.1–1.0)
Monocytes Relative: 15 %
Neutro Abs: 2.6 10*3/uL (ref 1.7–7.7)
Neutrophils Relative %: 52 %
Platelets: 269 10*3/uL (ref 150–400)
RBC: 2.66 MIL/uL — ABNORMAL LOW (ref 3.87–5.11)
RDW: 20.3 % — ABNORMAL HIGH (ref 11.5–15.5)
WBC: 5 10*3/uL (ref 4.0–10.5)
nRBC: 0 % (ref 0.0–0.2)

## 2019-08-18 LAB — URINALYSIS, DIPSTICK ONLY
Bilirubin Urine: NEGATIVE
Glucose, UA: NEGATIVE mg/dL
Hgb urine dipstick: NEGATIVE
Ketones, ur: NEGATIVE mg/dL
Nitrite: NEGATIVE
Protein, ur: NEGATIVE mg/dL
Specific Gravity, Urine: 1.005 (ref 1.005–1.030)
pH: 6 (ref 5.0–8.0)

## 2019-08-18 MED ORDER — DIPHENHYDRAMINE HCL 50 MG/ML IJ SOLN
INTRAMUSCULAR | Status: AC
  Filled 2019-08-18: qty 1

## 2019-08-18 MED ORDER — HEPARIN SOD (PORK) LOCK FLUSH 100 UNIT/ML IV SOLN
500.0000 [IU] | Freq: Once | INTRAVENOUS | Status: AC | PRN
  Administered 2019-08-18: 16:00:00 500 [IU]

## 2019-08-18 MED ORDER — POTASSIUM CHLORIDE CRYS ER 20 MEQ PO TBCR
20.0000 meq | EXTENDED_RELEASE_TABLET | Freq: Once | ORAL | Status: AC
  Administered 2019-08-18: 20 meq via ORAL

## 2019-08-18 MED ORDER — SODIUM CHLORIDE 0.9 % IV SOLN
Freq: Once | INTRAVENOUS | Status: AC
  Administered 2019-08-18: 10:00:00 via INTRAVENOUS

## 2019-08-18 MED ORDER — SODIUM CHLORIDE 0.9 % IV SOLN
175.0000 mg/m2 | Freq: Once | INTRAVENOUS | Status: AC
  Administered 2019-08-18: 12:00:00 336 mg via INTRAVENOUS
  Filled 2019-08-18: qty 56

## 2019-08-18 MED ORDER — FAMOTIDINE IN NACL 20-0.9 MG/50ML-% IV SOLN
INTRAVENOUS | Status: AC
  Filled 2019-08-18: qty 50

## 2019-08-18 MED ORDER — SODIUM CHLORIDE 0.9 % IV SOLN
Freq: Once | INTRAVENOUS | Status: AC
  Administered 2019-08-18: 10:00:00 via INTRAVENOUS
  Filled 2019-08-18: qty 5

## 2019-08-18 MED ORDER — SODIUM CHLORIDE 0.9% FLUSH
10.0000 mL | INTRAVENOUS | Status: DC | PRN
  Administered 2019-08-18: 10 mL
  Filled 2019-08-18: qty 10

## 2019-08-18 MED ORDER — DIPHENHYDRAMINE HCL 50 MG/ML IJ SOLN
50.0000 mg | Freq: Once | INTRAMUSCULAR | Status: AC
  Administered 2019-08-18: 50 mg via INTRAVENOUS

## 2019-08-18 MED ORDER — PALONOSETRON HCL INJECTION 0.25 MG/5ML
INTRAVENOUS | Status: AC
  Filled 2019-08-18: qty 5

## 2019-08-18 MED ORDER — FAMOTIDINE IN NACL 20-0.9 MG/50ML-% IV SOLN
20.0000 mg | Freq: Once | INTRAVENOUS | Status: AC
  Administered 2019-08-18: 20 mg via INTRAVENOUS

## 2019-08-18 MED ORDER — SODIUM CHLORIDE 0.9 % IV SOLN
200.0000 mg | Freq: Once | INTRAVENOUS | Status: AC
  Administered 2019-08-18: 200 mg via INTRAVENOUS
  Filled 2019-08-18: qty 8

## 2019-08-18 MED ORDER — POTASSIUM CHLORIDE CRYS ER 20 MEQ PO TBCR
EXTENDED_RELEASE_TABLET | ORAL | Status: AC
  Filled 2019-08-18: qty 1

## 2019-08-18 MED ORDER — PALONOSETRON HCL INJECTION 0.25 MG/5ML
0.2500 mg | Freq: Once | INTRAVENOUS | Status: AC
  Administered 2019-08-18: 0.25 mg via INTRAVENOUS

## 2019-08-18 MED ORDER — SODIUM CHLORIDE 0.9 % IV SOLN
440.0000 mg | Freq: Once | INTRAVENOUS | Status: AC
  Administered 2019-08-18: 440 mg via INTRAVENOUS
  Filled 2019-08-18: qty 44

## 2019-08-18 NOTE — Patient Instructions (Signed)
[facility] [place] Discharge Instructions for Patients Receiving Chemotherapy  Today you received the following chemotherapy agents   To help prevent nausea and vomiting after your treatment, we encourage you to take your nausea medication    If you develop nausea and vomiting that is not controlled by your nausea medication, call the clinic.   BELOW ARE SYMPTOMS THAT SHOULD BE REPORTED IMMEDIATELY:  *FEVER GREATER THAN 100.5 F  *CHILLS WITH OR WITHOUT FEVER  NAUSEA AND VOMITING THAT IS NOT CONTROLLED WITH YOUR NAUSEA MEDICATION  *UNUSUAL SHORTNESS OF BREATH  *UNUSUAL BRUISING OR BLEEDING  TENDERNESS IN MOUTH AND THROAT WITH OR WITHOUT PRESENCE OF ULCERS  *URINARY PROBLEMS  *BOWEL PROBLEMS  UNUSUAL RASH Items with * indicate a potential emergency and should be followed up as soon as possible.  Feel free to call the clinic should you have any questions or concerns. The clinic phone number is [phone].  Please show the [place] at check-in to the Emergency Department and triage nurse.

## 2019-08-18 NOTE — Assessment & Plan Note (Addendum)
1.  Metastatic poorly differentiated squamous cell carcinoma of the lung: -PDL 1 TPS-10%, foundation 1 MS-stable, no other targetable mutations. -PET scan on 05/08/2019 showed hypermetabolic right hilar, mediastinal, right supraclavicular adenopathy with solitary small hypermetabolic nodule in the right lung measuring 1 cm.  Focal activity in the right posterior oropharynx with no CT correlate.  Widespread hypermetabolic skeletal metastasis and solitary splenic metastasis.  MRI of the brain negative. -Right supraclavicular lymph node biopsy on 05/20/2019 shows metastatic poorly differentiated squamous cell carcinoma. - 3 cycles of carboplatin, paclitaxel and pembrolizumab from 06/15/2019 through 07/28/2019. - We reviewed the results of the CT CAP from 08/14/2019.  Right lower lobe pulmonary nodule is similar in size.  Splenic lesions slightly regressed.  New pathological compression fracture at L3.  Old pathological compression fracture at L2. -We will proceed with cycle 4 of chemoimmunotherapy today.  I will cut back on the dose of paclitaxel because of neuropathic pains in the legs. - We will see her back in 3 weeks for follow-up.  We will start her on maintenance pembrolizumab at that time.  2.  Taxane-induced leg pains: -She is taking gabapentin 200 mg at bedtime which is not helping.  I have told her to increase it to 300 mg.  She was also told to take 100 mg during the daytime.  3.  Constipation: -She will continue MiraLAX and stool softener.  4.  Low back pain: - She had steroid injections, last one on 06/30/2019.

## 2019-08-18 NOTE — Progress Notes (Signed)
Labs reviewed with MD today at office visit. Proceed with treatment today per MD. Will give additional potassium per orders.   Treatment given per orders. Patient tolerated it well without problems. Vitals stable and discharged home from clinic ambulatory. Follow up as scheduled.

## 2019-08-18 NOTE — Patient Instructions (Signed)
[facility] at [facility] Discharge Instructions  Labs drawn from portacath today   Thank you for choosing [facility] at [facility] to provide your oncology and hematology care.  To afford each patient quality time with our provider, please arrive at least 15 minutes before your scheduled appointment time.   If you have a lab appointment with the Cancer Center please come in thru the Main Entrance and check in at the main information desk.  You need to re-schedule your appointment should you arrive 10 or more minutes late.  We strive to give you quality time with our providers, and arriving late affects you and other patients whose appointments are after yours.  Also, if you no show three or more times for appointments you may be dismissed from the clinic at the providers discretion.     Again, thank you for choosing [facility].  Our hope is that these requests will decrease the amount of time that you wait before being seen by our physicians.       _____________________________________________________________  Should you have questions after your visit to [facility], please contact our office at [phone] between the hours of 8:00 a.m. and 4:30 p.m.  Voicemails left after 4:00 p.m. will not be returned until the following business day.  For prescription refill requests, have your pharmacy contact our office and allow 72 hours.    Due to Covid, you will need to wear a mask upon entering the hospital. If you do not have a mask, a mask will be given to you at the Main Entrance upon arrival. For doctor visits, patients may have 1 support person with them. For treatment visits, patients can not have anyone with them due to social distancing guidelines and our immunocompromised population.     

## 2019-08-18 NOTE — Progress Notes (Signed)
[place], [place] [address]   CLINIC:  Medical Oncology/Hematology  PCP:  [name], MD [address] [place] [address] [phone]   REASON FOR VISIT: Metastatic squamous cell lung cancer.   INTERVAL HISTORY:  Lindsey Mullins 73 y.o. female seen for follow-up prior to cycle 4 of chemotherapy for metastatic lung cancer.  Appetite is 75%.  Energy levels are low.  Numbness in the feet has been stable.  Gained couple of pounds.  She had CT scans done on 08/14/2019.  Denies any nausea, vomiting, diarrhea or constipation.  Denies any skin rashes.  Denies any dry cough.  No fevers or chills reported.  Complains of neuropathic pains in the legs.  She is taking gabapentin 200 mg at bedtime.  REVIEW OF SYSTEMS:  Review of Systems  Neurological: Positive for numbness.  Psychiatric/Behavioral: Positive for sleep disturbance.  All other systems reviewed and are negative.    PAST MEDICAL/SURGICAL HISTORY:  Past Medical History:  Diagnosis Date  . Atypical chest pain 05/23/2012   STRESS TEST - small to moderate sized area of partial reversibility of the anteroapical wall, most likely breast artifact, post-stress EF 67%, EKG show NSR at 65, No Lexiscan EKG changes, non-diagnostic for ischemia; STRESS TEST, 01/25/2010 - normal study, post-stress EF 66%, no significant ischemia  . Cancer ([place])   . Cerebral atherosclerosis 09/29/2012   CAROTID DUPLEX - RIGHT  BULB/PROXIMAL ICA-moderate amount of fibrous plaque, 50-69% diameter reduction; LEFT CEA-normal, no significant diameter reduction  . Colitis   . GERD (gastroesophageal reflux disease)   . Hyperlipidemia   . Hypertension   . Osteopenia   . Restless leg syndrome   . Shortness of breath 03/30/2005   2D ECHO - EF >55%, normal  . TIA (transient ischemic attack) 01/27/2010   2D ECHO - EF 65%, normal   Past Surgical History:  Procedure Laterality Date  . ABDOMINAL EXPLORATION SURGERY     . CAROTID ENDARTERECTOMY    . LYMPH NODE BIOPSY N/A 05/20/2019   Procedure: LYMPH NODE BIOPSY CERVICAL;  Surgeon: Aviva Signs, MD;  Location: [facility];  Service: General;  Laterality: N/A;  . Peripheral Vascular Catheterization  07/16/2005   Right verterbral-50% smooth segmental badsilar artery stenosis on right side, Right carotid-50% ulcerated proxmial stenosis, Left carotid-60 to 70% left externam carotid artery stenosis, 90% proximal left ICA stenosis  . PORTACATH PLACEMENT Left 06/12/2019   Procedure: INSERTION PORT-A-CATH (catheter attached left subclavian);  Surgeon: Aviva Signs, MD;  Location: [facility];  Service: General;  Laterality: Left;     SOCIAL HISTORY:  Social History   Socioeconomic History  . Marital status: Divorced    Spouse name: Not on file  . Number of children: 2  . Years of education: Not on file  . Highest education level: Not on file  Occupational History  . Not on file  Social Needs  . Financial resource strain: Not hard at all  . Food insecurity    Worry: Never true    Inability: Never true  . Transportation needs    Medical: No    Non-medical: No  Tobacco Use  . Smoking status: Former Smoker    Packs/day: 0.25    Years: 10.00    Pack years: 2.50    Types: Cigarettes    Quit date: 04/21/1983    Years since quitting: 36.3  . Smokeless tobacco: Never Used  Substance and Sexual Activity  . Alcohol  use: No  . Drug use: No  . Sexual activity: Not Currently  Lifestyle  . Physical activity    Days per week: 0 days    Minutes per session: 0 min  . Stress: Not at all  Relationships  . Social Herbalist on phone: Twice a week    Gets together: Twice a week    Attends religious service: More than 4 times per year    Active member of club or organization: No    Attends meetings of clubs or organizations: Never    Relationship status: Divorced  . Intimate partner violence    Fear of current or ex partner: No    Emotionally abused: No     Physically abused: No    Forced sexual activity: No  Other Topics Concern  . Not on file  Social History Narrative  . Not on file    FAMILY HISTORY:  Family History  Problem Relation Age of Onset  . Heart disease Mother   . Hypertension Mother   . Hypertension Father   . Heart disease Father     CURRENT MEDICATIONS:  Outpatient Encounter Medications as of 08/18/2019  Medication Sig Note  . aspirin 81 MG tablet Take 81 mg by mouth at bedtime.    . carvedilol (COREG) 3.125 MG tablet TAKE 1 TABLET BY MOUTH ONCE DAILY (Patient taking differently: Take 3.125 mg by mouth daily. )   . gabapentin (NEURONTIN) 100 MG capsule Take 2 capsules (200 mg total) by mouth at bedtime.   [name] losartan (COZAAR) 50 MG tablet Take 1/2 (one-half) tablet by mouth once daily   . Omega-3 Krill Oil 500 MG CAPS Take 500 mg by mouth daily.    . polyethylene glycol powder (GLYCOLAX/MIRALAX) 17 GM/SCOOP powder Take 17 g by mouth 2 (two) times daily. Until daily soft stools  OTC   . rosuvastatin (CRESTOR) 20 MG tablet Take 1 tablet (20 mg total) by mouth daily. (Patient taking differently: Take 10 mg by mouth 2 (two) times daily as needed (cholesterol). )   . senna (SENOKOT) 8.6 MG TABS tablet Take 2 tablets by mouth daily.   . cyclobenzaprine (FLEXERIL) 5 MG tablet TAKE 1 TABLET BY MOUTH AT BEDTIME AS NEEDED FOR MUSCLE SPASM OR  LEG  CRAMPS (Patient not taking: No sig reported)   . HYDROmorphone (DILAUDID) 2 MG tablet Take 0.5 tablets (1 mg total) by mouth every 6 (six) hours as needed for severe pain. (Patient not taking: Reported on 07/28/2019) 07/24/2019: prn  . Lactulose 20 GM/30ML SOLN Lactulose 10gm.  Take 4ml to 66ml three times a day to assist with moving bowels.  Titrate down if having multiple bowel movements.  Call if no results after taking the medication three times a day. (Patient not taking: Reported on 07/28/2019)   . loratadine (CLARITIN) 10 MG tablet Take 10 mg by mouth daily as needed for allergies or  itching.    . naproxen sodium (ALEVE) 220 MG tablet Take 220 mg by mouth 2 (two) times daily as needed (pain).    . nitroGLYCERIN (NITROSTAT) 0.4 MG SL tablet DISSOLVE ONE TABLET UNDER THE TONGUE AS NEEDED FOR CHEST PAIN**MAX 3 TABLETS EACH 5 MINUTES APART** (Patient not taking: Reported on 07/28/2019)   . omeprazole (PRILOSEC) 20 MG capsule Take 1 po daily (Patient not taking: Reported on 08/18/2019)   . tiZANidine (ZANAFLEX) 4 MG tablet Take 4 mg by mouth daily as needed for muscle spasms.     No  facility-administered encounter medications on file as of 08/18/2019.     ALLERGIES:  Allergies  Allergen Reactions  . Penicillins Shortness Of Breath and Other (See Comments)    Has patient had a PCN reaction causing immediate rash, facial/tongue/throat swelling, SOB or lightheadedness with hypotension: Yes Has patient had a PCN reaction causing severe rash involving mucus membranes or skin necrosis: Unk Has patient had a PCN reaction that required hospitalization: Unk Has patient had a PCN reaction occurring within the last 10 years: Yes "States that she was in ICU after being administered"   . Codeine Nausea And Vomiting  . Lipitor [Atorvastatin] Other (See Comments)    Myalgias  . Lisinopril Cough  . Pravastatin Other (See Comments)    Myalgias     PHYSICAL EXAM:  ECOG Performance status: 1  Vitals:   08/18/19 0827  BP: (!) 98/46  Pulse: 86  Resp: 18  Temp: (!) 97.2 F (36.2 C)  SpO2: 100%   Filed Weights   08/18/19 0827  Weight: 168 lb (76.2 kg)    Physical Exam Constitutional:      Appearance: Normal appearance. She is normal weight.  Cardiovascular:     Rate and Rhythm: Normal rate and regular rhythm.     Heart sounds: Normal heart sounds.  Pulmonary:     Effort: Pulmonary effort is normal.     Breath sounds: Normal breath sounds.  Abdominal:     General: Bowel sounds are normal.     Palpations: Abdomen is soft.  Musculoskeletal: Normal range of motion.  Skin:     General: Skin is warm and dry.  Neurological:     Mental Status: She is alert and oriented to person, place, and time. Mental status is at baseline.  Psychiatric:        Mood and Affect: Mood normal.        Behavior: Behavior normal.        Thought Content: Thought content normal.        Judgment: Judgment normal.      LABORATORY DATA:  I have reviewed the labs as listed.  CBC    Component Value Date/Time   WBC 5.0 08/18/2019 0834   RBC 2.66 (L) 08/18/2019 0834   HGB 8.0 (L) 08/18/2019 0834   HCT 25.9 (L) 08/18/2019 0834   PLT 269 08/18/2019 0834   MCV 97.4 08/18/2019 0834   MCH 30.1 08/18/2019 0834   MCHC 30.9 08/18/2019 0834   RDW 20.3 (H) 08/18/2019 0834   LYMPHSABS 1.3 08/18/2019 0834   MONOABS 0.8 08/18/2019 0834   EOSABS 0.3 08/18/2019 0834   BASOSABS 0.1 08/18/2019 0834   CMP Latest Ref Rng & Units 08/18/2019 07/28/2019 07/14/2019  Glucose 70 - 99 mg/dL 106(H) 106(H) 92  BUN 8 - 23 mg/dL 10 8 10   Creatinine 0.44 - 1.00 mg/dL 0.66 0.64 0.61  Sodium 135 - 145 mmol/L 141 140 135  Potassium 3.5 - 5.1 mmol/L 3.3(L) 3.3(L) 3.4(L)  Chloride 98 - 111 mmol/L 106 108 101  CO2 22 - 32 mmol/L 25 23 23   Calcium 8.9 - 10.3 mg/dL 9.0 8.8(L) 9.2  Total Protein 6.5 - 8.1 g/dL 6.5 6.8 7.5  Total Bilirubin 0.3 - 1.2 mg/dL 0.6 0.4 0.6  Alkaline Phos 38 - 126 U/L 122 147(H) 174(H)  AST 15 - 41 U/L 24 22 25   ALT 0 - 44 U/L 16 15 22        DIAGNOSTIC IMAGING:  I have independently reviewed the scans and discussed with  the patient.   ASSESSMENT & PLAN:   Squamous cell carcinoma of lung, stage IV, right ([place]) 1.  Metastatic poorly differentiated squamous cell carcinoma of the lung: -PDL 1 TPS-10%, foundation 1 MS-stable, no other targetable mutations. -PET scan on 05/08/2019 showed hypermetabolic right hilar, mediastinal, right supraclavicular adenopathy with solitary small hypermetabolic nodule in the right lung measuring 1 cm.  Focal activity in the right posterior oropharynx  with no CT correlate.  Widespread hypermetabolic skeletal metastasis and solitary splenic metastasis.  MRI of the brain negative. -Right supraclavicular lymph node biopsy on 05/20/2019 shows metastatic poorly differentiated squamous cell carcinoma. - 3 cycles of carboplatin, paclitaxel and pembrolizumab from 06/15/2019 through 07/28/2019. - We reviewed the results of the CT CAP from 08/14/2019.  Right lower lobe pulmonary nodule is similar in size.  Splenic lesions slightly regressed.  New pathological compression fracture at L3.  Old pathological compression fracture at L2. -We will proceed with cycle 4 of chemoimmunotherapy today.  I will cut back on the dose of paclitaxel because of neuropathic pains in the legs. - We will see her back in 3 weeks for follow-up.  We will start her on maintenance pembrolizumab at that time.  2.  Taxane-induced leg pains: -She is taking gabapentin 200 mg at bedtime which is not helping.  I have told her to increase it to 300 mg.  She was also told to take 100 mg during the daytime.  3.  Constipation: -She will continue MiraLAX and stool softener.  4.  Low back pain: - She had steroid injections, last one on 06/30/2019.  Total time spent is 25 minutes with more than 50% of the time spent face-to-face discussing treatment plan, counseling and coordination of care.  Orders placed this encounter:  Orders Placed This Encounter  Procedures  . CBC with Differential/Platelet  . Comprehensive metabolic panel      Derek Jack, MD [place] [phone]

## 2019-08-18 NOTE — Patient Instructions (Addendum)
[place] at [facility] Discharge Instructions  You were seen today by Dr. Delton Coombes. He went over your recent lab results. He will see you back in 3 weeks for labs, treatment and follow up.  Try taking your gabapentin a few times during the day. Start taking 300mg  at night to help with the leg pain.  Thank you for choosing [place] at [facility] to provide your oncology and hematology care.  To afford each patient quality time with our provider, please arrive at least 15 minutes before your scheduled appointment time.   If you have a lab appointment with the [facility] please come in thru the  Main Entrance and check in at the main information desk  You need to re-schedule your appointment should you arrive 10 or more minutes late.  We strive to give you quality time with our providers, and arriving late affects you and other patients whose appointments are after yours.  Also, if you no show three or more times for appointments you may be dismissed from the clinic at the providers discretion.     Again, thank you for choosing [facility].  Our hope is that these requests will decrease the amount of time that you wait before being seen by our physicians.       _____________________________________________________________  Should you have questions after your visit to [facility], please contact our office at [phone] between the hours of 8:00 a.m. and 4:30 p.m.  Voicemails left after 4:00 p.m. will not be returned until the following business day.  For prescription refill requests, have your pharmacy contact our office and allow 72 hours.    Cancer Center Support Programs:   > Cancer Support Group  2nd Tuesday of the month 1pm-2pm, Journey Room

## 2019-08-20 ENCOUNTER — Other Ambulatory Visit: Payer: Self-pay

## 2019-08-20 ENCOUNTER — Inpatient Hospital Stay (HOSPITAL_COMMUNITY): Payer: Medicare Other | Attending: Hematology

## 2019-08-20 VITALS — BP 148/77 | HR 86 | Temp 97.2°F | Resp 18

## 2019-08-20 DIAGNOSIS — Z7689 Persons encountering health services in other specified circumstances: Secondary | ICD-10-CM | POA: Insufficient documentation

## 2019-08-20 DIAGNOSIS — C7951 Secondary malignant neoplasm of bone: Secondary | ICD-10-CM | POA: Diagnosis present

## 2019-08-20 DIAGNOSIS — Z923 Personal history of irradiation: Secondary | ICD-10-CM | POA: Insufficient documentation

## 2019-08-20 DIAGNOSIS — C3491 Malignant neoplasm of unspecified part of right bronchus or lung: Secondary | ICD-10-CM | POA: Diagnosis not present

## 2019-08-20 MED ORDER — PEGFILGRASTIM INJECTION 6 MG/0.6ML ~~LOC~~
6.0000 mg | PREFILLED_SYRINGE | Freq: Once | SUBCUTANEOUS | Status: AC
  Administered 2019-08-20: 6 mg via SUBCUTANEOUS
  Filled 2019-08-20: qty 0.6

## 2019-08-20 NOTE — Progress Notes (Signed)
Lindsey Mullins presents today for injection per MD orders. Neulasta administered SQ in right Upper Arm. Administration without incident. Patient tolerated well.  Vital signs stable. No complaints at this time. Discharged from clinic ambulatory. F/U with [facility] as scheduled.

## 2019-08-20 NOTE — Patient Instructions (Signed)
[facility] at [facility]  Discharge Instructions:   _______________________________________________________________  Thank you for choosing [facility] at [facility] to provide your oncology and hematology care.  To afford each patient quality time with our providers, please arrive at least 15 minutes before your scheduled appointment.  You need to re-schedule your appointment if you arrive 10 or more minutes late.  We strive to give you quality time with our providers, and arriving late affects you and other patients whose appointments are after yours.  Also, if you no show three or more times for appointments you may be dismissed from the clinic.  Again, thank you for choosing  Cancer Center at [facility]. Our hope is that these requests will allow you access to exceptional care and in a timely manner. _______________________________________________________________  If you have questions after your visit, please contact our office at [phone] between the hours of 8:30 a.m. and 5:00 p.m. Voicemails left after 4:30 p.m. will not be returned until the following business day. _______________________________________________________________  For prescription refill requests, have your pharmacy contact our office. _______________________________________________________________  Recommendations made by the consultant and any test results will be sent to your referring physician. _______________________________________________________________ 

## 2019-08-26 ENCOUNTER — Other Ambulatory Visit: Payer: Self-pay | Admitting: Family Medicine

## 2019-08-28 ENCOUNTER — Telehealth: Payer: Self-pay | Admitting: *Deleted

## 2019-08-28 NOTE — Telephone Encounter (Signed)
Received call from patient.   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   Forms dropped off to office.   Routed to provider for completion.

## 2019-08-31 NOTE — Telephone Encounter (Signed)
Call placed to patient and patient made aware.   Appointment scheduled.  

## 2019-08-31 NOTE — Telephone Encounter (Signed)
Please set up phone visit with her I will need to verify if she is able to drive at this time with her current treatments and meds

## 2019-09-04 ENCOUNTER — Other Ambulatory Visit: Payer: Self-pay

## 2019-09-04 ENCOUNTER — Encounter: Payer: Self-pay | Admitting: Family Medicine

## 2019-09-04 ENCOUNTER — Ambulatory Visit (INDEPENDENT_AMBULATORY_CARE_PROVIDER_SITE_OTHER): Payer: Medicare Other | Admitting: Family Medicine

## 2019-09-04 DIAGNOSIS — C3491 Malignant neoplasm of unspecified part of right bronchus or lung: Secondary | ICD-10-CM | POA: Diagnosis not present

## 2019-09-04 DIAGNOSIS — I10 Essential (primary) hypertension: Secondary | ICD-10-CM

## 2019-09-04 DIAGNOSIS — M5136 Other intervertebral disc degeneration, lumbar region: Secondary | ICD-10-CM | POA: Diagnosis not present

## 2019-09-04 MED ORDER — SIMVASTATIN 20 MG PO TABS: 20.0000 mg | ORAL_TABLET | Freq: Every day | ORAL | 3 refills | Status: DC

## 2019-09-04 NOTE — Progress Notes (Signed)
Virtual Visit via Telephone Note  I connected with Lindsey Mullins on 09/04/19 at 9:30am by telephone and verified that I am speaking with the correct person using two identifiers.      Pt location: at home   Physician location:  In office, [facility], Vic Blackbird MD     On call: patient and physician   I discussed the limitations, risks, security and privacy concerns of performing an evaluation and management service by telephone and the availability of in person appointments. I also discussed with the patient that there may be a patient responsible charge related to this service. The patient expressed understanding and agreed to proceed.   History of Present Illness: (For DMV forms.  She just had a recent physical exam in September in office.  She is black by the DMV secondary to her hypertension and arthritis in her back.  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  She is able to operate her personal vehicle without any difficulties.  Her medications are reviewed in detail.  She does take gabapentin typically 100 mg at bedtime but no other sedating medications at this time.  She is aware not to drive if she does feel somnolent or sedated.  She has no new concerns today otherwise.  She states that she is completed her chemotherapy for her cancer.   Observations/Objective: NAD  Noted on phone   Assessment and Plan: HTN- has been controlled, no changes with meds  DDD lumbar spine, compression fracture, Lung cancer- continue gabapentin at bedtime   Forms completed, pt safe to drive at this time   Follow Up Instructions:    I discussed the assessment and treatment plan with the patient. The patient was provided an opportunity to ask questions and all were answered. The patient agreed with the plan and demonstrated an understanding of the instructions.   The patient was  advised to call back or seek an in-person evaluation if the symptoms worsen or if the condition fails to improve as anticipated.  I provided 14 minutes of non-face-to-face time during this encounter. End Time  9:44am   Vic Blackbird, MD

## 2019-09-07 ENCOUNTER — Other Ambulatory Visit: Payer: Self-pay

## 2019-09-08 ENCOUNTER — Other Ambulatory Visit (HOSPITAL_COMMUNITY): Payer: Medicare Other

## 2019-09-08 ENCOUNTER — Emergency Department (HOSPITAL_COMMUNITY)
Admission: EM | Admit: 2019-09-08 | Discharge: 2019-09-08 | Disposition: A | Discharge status: Home / Self Care | Payer: Medicare Other | Attending: Emergency Medicine | Admitting: Emergency Medicine

## 2019-09-08 ENCOUNTER — Other Ambulatory Visit: Payer: Self-pay

## 2019-09-08 ENCOUNTER — Inpatient Hospital Stay (HOSPITAL_BASED_OUTPATIENT_CLINIC_OR_DEPARTMENT_OTHER): Payer: Medicare Other | Admitting: Hematology

## 2019-09-08 ENCOUNTER — Inpatient Hospital Stay (HOSPITAL_COMMUNITY): Payer: Medicare Other

## 2019-09-08 ENCOUNTER — Encounter (HOSPITAL_COMMUNITY): Payer: Self-pay

## 2019-09-08 DIAGNOSIS — C3491 Malignant neoplasm of unspecified part of right bronchus or lung: Secondary | ICD-10-CM

## 2019-09-08 DIAGNOSIS — Z5112 Encounter for antineoplastic immunotherapy: Secondary | ICD-10-CM

## 2019-09-08 DIAGNOSIS — Z79899 Other long term (current) drug therapy: Secondary | ICD-10-CM | POA: Diagnosis not present

## 2019-09-08 DIAGNOSIS — C3401 Malignant neoplasm of right main bronchus: Secondary | ICD-10-CM | POA: Insufficient documentation

## 2019-09-08 DIAGNOSIS — I2510 Atherosclerotic heart disease of native coronary artery without angina pectoris: Secondary | ICD-10-CM | POA: Diagnosis not present

## 2019-09-08 DIAGNOSIS — Z87891 Personal history of nicotine dependence: Secondary | ICD-10-CM | POA: Diagnosis not present

## 2019-09-08 DIAGNOSIS — I10 Essential (primary) hypertension: Secondary | ICD-10-CM | POA: Insufficient documentation

## 2019-09-08 DIAGNOSIS — R55 Syncope and collapse: Secondary | ICD-10-CM | POA: Insufficient documentation

## 2019-09-08 DIAGNOSIS — C799 Secondary malignant neoplasm of unspecified site: Secondary | ICD-10-CM

## 2019-09-08 DIAGNOSIS — Z7982 Long term (current) use of aspirin: Secondary | ICD-10-CM | POA: Insufficient documentation

## 2019-09-08 LAB — CBG MONITORING, ED: Glucose-Capillary: 89 mg/dL (ref 70–99)

## 2019-09-08 LAB — CBC WITH DIFFERENTIAL/PLATELET
Abs Immature Granulocytes: 0.02 10*3/uL (ref 0.00–0.07)
Basophils Absolute: 0.1 10*3/uL (ref 0.0–0.1)
Basophils Relative: 1 %
Eosinophils Absolute: 0.4 10*3/uL (ref 0.0–0.5)
Eosinophils Relative: 6 %
HCT: 24.7 % — ABNORMAL LOW (ref 36.0–46.0)
Hemoglobin: 7.8 g/dL — ABNORMAL LOW (ref 12.0–15.0)
Immature Granulocytes: 0 %
Lymphocytes Relative: 22 %
Lymphs Abs: 1.4 10*3/uL (ref 0.7–4.0)
MCH: 31.5 pg (ref 26.0–34.0)
MCHC: 31.6 g/dL (ref 30.0–36.0)
MCV: 99.6 fL (ref 80.0–100.0)
Monocytes Absolute: 1.1 10*3/uL — ABNORMAL HIGH (ref 0.1–1.0)
Monocytes Relative: 17 %
Neutro Abs: 3.5 10*3/uL (ref 1.7–7.7)
Neutrophils Relative %: 54 %
Platelets: 207 10*3/uL (ref 150–400)
RBC: 2.48 MIL/uL — ABNORMAL LOW (ref 3.87–5.11)
RDW: 19.8 % — ABNORMAL HIGH (ref 11.5–15.5)
WBC: 6.4 10*3/uL (ref 4.0–10.5)
nRBC: 0 % (ref 0.0–0.2)

## 2019-09-08 LAB — COMPREHENSIVE METABOLIC PANEL
ALT: 12 U/L (ref 0–44)
AST: 19 U/L (ref 15–41)
Albumin: 3.4 g/dL — ABNORMAL LOW (ref 3.5–5.0)
Alkaline Phosphatase: 105 U/L (ref 38–126)
Anion gap: 8 (ref 5–15)
BUN: 10 mg/dL (ref 8–23)
CO2: 23 mmol/L (ref 22–32)
Calcium: 8.5 mg/dL — ABNORMAL LOW (ref 8.9–10.3)
Chloride: 108 mmol/L (ref 98–111)
Creatinine, Ser: 0.6 mg/dL (ref 0.44–1.00)
GFR calc Af Amer: >60 mL/min (ref >60)
GFR calc non Af Amer: >60 mL/min (ref >60)
Glucose, Bld: 97 mg/dL (ref 70–99)
Potassium: 3.3 mmol/L — ABNORMAL LOW (ref 3.5–5.1)
Sodium: 139 mmol/L (ref 135–145)
Total Bilirubin: 0.2 mg/dL — ABNORMAL LOW (ref 0.3–1.2)
Total Protein: 6.1 g/dL — ABNORMAL LOW (ref 6.5–8.1)

## 2019-09-08 LAB — URINALYSIS, ROUTINE W REFLEX MICROSCOPIC
Bilirubin Urine: NEGATIVE
Glucose, UA: NEGATIVE mg/dL
Hgb urine dipstick: NEGATIVE
Ketones, ur: NEGATIVE mg/dL
Nitrite: NEGATIVE
Protein, ur: NEGATIVE mg/dL
Specific Gravity, Urine: 1.012 (ref 1.005–1.030)
pH: 6 (ref 5.0–8.0)

## 2019-09-08 LAB — TSH: TSH: 0.129 u[IU]/mL — ABNORMAL LOW (ref 0.350–4.500)

## 2019-09-08 MED ORDER — HEPARIN SOD (PORK) LOCK FLUSH 100 UNIT/ML IV SOLN
500.0000 [IU] | Freq: Once | INTRAVENOUS | Status: AC
  Administered 2019-09-08: 13:00:00 500 [IU]
  Filled 2019-09-08: qty 5

## 2019-09-08 MED ORDER — POTASSIUM CHLORIDE CRYS ER 20 MEQ PO TBCR
20.0000 meq | EXTENDED_RELEASE_TABLET | Freq: Once | ORAL | Status: AC
  Administered 2019-09-08: 20 meq via ORAL
  Filled 2019-09-08: qty 1

## 2019-09-08 MED ORDER — SODIUM CHLORIDE 0.9 % IV BOLUS: 1000.0000 mL | Freq: Once | INTRAVENOUS | Status: DC

## 2019-09-08 NOTE — Progress Notes (Signed)
Pt arrived to clinic via wheelchair accompanied by her daughter. Pt lethargic, unable to answer questions,diaphoretic. B/P 78/31 P53 R20 O2 sat 100%. RN ester NP and RLockamy NP arrived to assess pt. Manual B/P 70/30 Port accessed with NS started at 999 ml/hr per NP orders and pt transported via wheelchair to ER by RNester NP and [place] LPN

## 2019-09-08 NOTE — Progress Notes (Signed)
Ms. Tolen arrived to clinic with her daughter. Patient was not arousable by voice, but was arousable by touch.  Patient was diaphoretic.  Manual blood pressure was 70/30 with heart rate of 60.  Normal saline was started at 999 mL per hour and patient was transported to the emergency room for further work-up of hypotension.

## 2019-09-08 NOTE — ED Notes (Signed)
ED Provider at bedside. 

## 2019-09-08 NOTE — ED Notes (Signed)
[facility] staff accessed pt's port and started IV fluids.

## 2019-09-08 NOTE — Discharge Instructions (Addendum)
Your lab tests today are stable. You have a mild low potassium level which was replaced here with oral supplementation (but this did not cause your syncope).  As you suspect, your blood pressure may be too strong - I recommend following up with your primary MD to discuss this problem and medication adjustments.    The cancer center will call you to rescheduled your missed treatment today. You do not need to go up there when you leave here.

## 2019-09-08 NOTE — ED Provider Notes (Signed)
Medical screening examination/treatment/procedure(s) were conducted as a shared visit with non-physician practitioner(s) and myself.  I personally evaluated the patient during the encounter.  Clinical Impression:   Final diagnoses:  Syncope, unspecified syncope type     This patient presents from the cancer center - she had presented this morning in preparation for chemo - was found to have light headedness and then had syncope - had become diaphoretic, ill appearing, pale and was brought to the ER.    PE:  Initially slightly pale but improved significantly over the last couple of hours.  Well appearing at this tie.   EKG Interpretation  Date/Time:  Tuesday September 08 2019 09:02:01 EDT Ventricular Rate:  73 PR Interval:    QRS Duration: 91 QT Interval:  385 QTC Calculation: 425 R Axis:   45 Text Interpretation:  Sinus rhythm Since last tracing rate faster Confirmed by Noemi Chapel [phone] on 09/08/2019 9:04:45 AM         Noemi Chapel, MD 09/09/19 [phone]

## 2019-09-08 NOTE — ED Provider Notes (Signed)
[facility] EMERGENCY DEPARTMENT Provider Note   CSN: [id number] Arrival date & time: 09/08/19  6761     History   Chief Complaint Chief Complaint  Patient presents with  . Hypotension    HPI Lindsey Mullins is a 73 y.o. female with a history of metastatic small cell lung cancer, HTN, Hyperlipidemia and TIA (pt not aware of this diagnosis) was at the cancer center this morning when she became lightheaded, diaphoretic and had a brief syncopal event.  She has completed her chemotherapy and was here today for a immunotherapy treatment. She denies headache or focal weakness, chest pain, palpitations, sob, abdominal pain, n/v/d and denies risk factors for dehydration.  Dg at bedside states she had a similar episode and was seen here in July after receiving a Neulasta injection.  She also reports she usually takes her morning blood pressure medicines hours apart, breaking them in 1/2  (coreg) and 1/4 (losartan) to avoid hypotension.   She took them all together this am due her appt here.  She is feeling better at this time.  She denies room spinning sensation.       The history is provided by the patient and a relative.    Past Medical History:  Diagnosis Date  . Atypical chest pain 05/23/2012   STRESS TEST - small to moderate sized area of partial reversibility of the anteroapical wall, most likely breast artifact, post-stress EF 67%, EKG show NSR at 65, No Lexiscan EKG changes, non-diagnostic for ischemia; STRESS TEST, 01/25/2010 - normal study, post-stress EF 66%, no significant ischemia  . Cancer ([place])   . Cerebral atherosclerosis 09/29/2012   CAROTID DUPLEX - RIGHT  BULB/PROXIMAL ICA-moderate amount of fibrous plaque, 50-69% diameter reduction; LEFT CEA-normal, no significant diameter reduction  . Colitis   . GERD (gastroesophageal reflux disease)   . Hyperlipidemia   . Hypertension   . Osteopenia   . Restless leg syndrome   . Shortness of breath 03/30/2005   2D ECHO - EF >55%,  normal  . TIA (transient ischemic attack) 01/27/2010   2D ECHO - EF 65%, normal    Patient Active Problem List   Diagnosis Date Noted  . Malignant neoplasm of right lung ([place])   . Goals of care, counseling/discussion 06/10/2019  . Squamous cell carcinoma of lung, stage IV, right ([place]) 05/28/2019  . Metastatic cancer ([place]) 05/26/2019  . Cervical lymphadenopathy   . Compression fracture of L2 ([place]) 04/29/2019  . Arteriosclerosis, mesenteric artery ([place]) 12/31/2018  . Abdominal pain 12/22/2018  . Colitis 12/22/2018  . Osteopenia   . DDD (degenerative disc disease), lumbar 05/11/2016  . Obesity 05/11/2016  . Glucose intolerance (impaired glucose tolerance) 01/12/2016  . Pain in the chest   . Chest pain 01/10/2016  . GERD (gastroesophageal reflux disease) 01/26/2015  . Stress and adjustment reaction 01/26/2015  . Dysuria 11/30/2013  . Carotid artery disease ([place]) 04/20/2013  . Bunion, left 09/21/2012  . Overweight(278.02) 06/04/2012  . Essential hypertension, benign 04/02/2012  . Hyperlipidemia 04/02/2012  . Dysphagia 04/02/2012  . RLS (restless legs syndrome) 04/02/2012    Past Surgical History:  Procedure Laterality Date  . ABDOMINAL EXPLORATION SURGERY    . CAROTID ENDARTERECTOMY    . LYMPH NODE BIOPSY N/A 05/20/2019   Procedure: LYMPH NODE BIOPSY CERVICAL;  Surgeon: Aviva Signs, MD;  Location: [facility];  Service: General;  Laterality: N/A;  . Peripheral Vascular Catheterization  07/16/2005   Right verterbral-50% smooth segmental badsilar artery stenosis on right side, Right carotid-50%  ulcerated proxmial stenosis, Left carotid-60 to 70% left externam carotid artery stenosis, 90% proximal left ICA stenosis  . PORTACATH PLACEMENT Left 06/12/2019   Procedure: INSERTION PORT-A-CATH (catheter attached left subclavian);  Surgeon: Aviva Signs, MD;  Location: [facility];  Service: General;  Laterality: Left;     OB History    Gravida  2   Para  2   Term  2   Preterm      AB       Living        SAB      TAB      Ectopic      Multiple      Live Births               Home Medications    Prior to Admission medications   Medication Sig Start Date End Date Taking? Authorizing Provider  aspirin 81 MG tablet Take 81 mg by mouth at bedtime.    Yes [provider]  carvedilol (COREG) 3.125 MG tablet Take 1 tablet by mouth once daily 08/26/19  Yes [place], Modena Nunnery, MD  gabapentin (NEURONTIN) 100 MG capsule Take 2 capsules (200 mg total) by mouth at bedtime. 07/24/19  Yes [place], Modena Nunnery, MD  loratadine (CLARITIN) 10 MG tablet Take 10 mg by mouth daily as needed for allergies or itching.    Yes [provider]  losartan (COZAAR) 50 MG tablet Take 1/2 (one-half) tablet by mouth once daily Patient taking differently: Take 25 mg by mouth daily.  06/22/19  Yes Lorretta Harp, MD  naproxen sodium (ALEVE) 220 MG tablet Take 220 mg by mouth 2 (two) times daily as needed (pain).    Yes [provider]  nitroGLYCERIN (NITROSTAT) 0.4 MG SL tablet DISSOLVE ONE TABLET UNDER THE TONGUE AS NEEDED FOR CHEST PAIN**MAX 3 TABLETS EACH 5 MINUTES APART** Patient taking differently: Place 0.4 mg under the tongue every 5 (five) minutes as needed. DISSOLVE ONE TABLET UNDER THE TONGUE AS NEEDED FOR CHEST PAIN**MAX 3 TABLETS EACH 5 MINUTES APART** 09/14/16  Yes [place], Modena Nunnery, MD  Omega-3 Krill Oil 500 MG CAPS Take 500 mg by mouth daily.    Yes [provider]  omeprazole (PRILOSEC) 20 MG capsule Take 1 po daily Patient taking differently: Take 20 mg by mouth daily.  12/31/18  Yes [place], Modena Nunnery, MD  polyethylene glycol powder (GLYCOLAX/MIRALAX) 17 GM/SCOOP powder Take 17 g by mouth 2 (two) times daily. Until daily soft stools  OTC Patient taking differently: Take 17 g by mouth 2 (two) times daily as needed. Until daily soft stools  OTC 06/19/19  Yes Antonietta Breach, PA-C  senna (SENOKOT) 8.6 MG TABS tablet Take 2 tablets by mouth daily.   Yes  [provider]  simvastatin (ZOCOR) 20 MG tablet Take 1 tablet (20 mg total) by mouth at bedtime. 09/04/19  Yes [place], Modena Nunnery, MD    Family History Family History  Problem Relation Age of Onset  . Heart disease Mother   . Hypertension Mother   . Hypertension Father   . Heart disease Father     Social History Social History   Tobacco Use  . Smoking status: Former Smoker    Packs/day: 0.25    Years: 10.00    Pack years: 2.50    Types: Cigarettes    Quit date: 04/21/1983    Years since quitting: 36.4  . Smokeless tobacco: Never Used  Substance Use Topics  . Alcohol use: No  .  Drug use: No     Allergies   Penicillins, Codeine, Lipitor [atorvastatin], Lisinopril, and Pravastatin   Review of Systems Review of Systems  Constitutional: Positive for diaphoresis. Negative for fever.  HENT: Negative for congestion and sore throat.   Eyes: Negative.   Respiratory: Negative for chest tightness and shortness of breath.   Cardiovascular: Negative for chest pain.  Gastrointestinal: Negative for abdominal pain, nausea and vomiting.  Genitourinary: Negative.   Musculoskeletal: Negative for arthralgias, joint swelling and neck pain.  Skin: Negative.  Negative for rash and wound.  Neurological: Positive for syncope, weakness and light-headedness. Negative for dizziness, seizures, facial asymmetry, numbness and headaches.  Psychiatric/Behavioral: Negative.      Physical Exam Updated Vital Signs BP (!) 145/80   Pulse 71   Temp 98.5 F (36.9 C) (Oral)   Resp 20   Ht 5\' 8"  (1.727 m)   Wt 76.7 kg   SpO2 100%   BMI 25.70 kg/m   Physical Exam Vitals signs and nursing note reviewed.  Constitutional:      Appearance: She is well-developed.  HENT:     Head: Normocephalic and atraumatic.     Mouth/Throat:     Comments: Buccal mucosa pale and dry.  Lips pale.   Eyes:     Extraocular Movements: Extraocular movements intact.     Conjunctiva/sclera: Conjunctivae  normal.  Neck:     Musculoskeletal: Normal range of motion.  Cardiovascular:     Rate and Rhythm: Normal rate and regular rhythm.     Heart sounds: Normal heart sounds.  Pulmonary:     Effort: Pulmonary effort is normal.     Breath sounds: Normal breath sounds. No wheezing.  Abdominal:     General: Bowel sounds are normal. There is no distension.     Palpations: Abdomen is soft.     Tenderness: There is no abdominal tenderness. There is no guarding.  Musculoskeletal: Normal range of motion.        General: No tenderness.     Right lower leg: No edema.     Left lower leg: No edema.  Skin:    General: Skin is warm and dry.  Neurological:     General: No focal deficit present.     Mental Status: She is alert.     Cranial Nerves: No dysarthria or facial asymmetry.     Sensory: Sensation is intact.     Comments: Equal grip strength, no neglect.  Moves all extremities.  Lying with eyes closed.  Responds to commands.       ED Treatments / Results  Labs (all labs ordered are listed, but only abnormal results are displayed) Labs Reviewed  CBC WITH DIFFERENTIAL/PLATELET - Abnormal; Notable for the following components:      Result Value   RBC 2.48 (*)    Hemoglobin 7.8 (*)    HCT 24.7 (*)    RDW 19.8 (*)    Monocytes Absolute 1.1 (*)    All other components within normal limits  COMPREHENSIVE METABOLIC PANEL - Abnormal; Notable for the following components:   Potassium 3.3 (*)    Calcium 8.5 (*)    Total Protein 6.1 (*)    Albumin 3.4 (*)    Total Bilirubin 0.2 (*)    All other components within normal limits  URINALYSIS, ROUTINE W REFLEX MICROSCOPIC - Abnormal; Notable for the following components:   APPearance HAZY (*)    Leukocytes,Ua SMALL (*)    Bacteria, UA RARE (*)  All other components within normal limits  CBG MONITORING, ED    EKG EKG Interpretation  Date/Time:  Tuesday September 08 2019 09:02:01 EDT Ventricular Rate:  73 PR Interval:    QRS Duration: 91  QT Interval:  385 QTC Calculation: 425 R Axis:   45 Text Interpretation:  Sinus rhythm Since last tracing rate faster Confirmed by Noemi Chapel [phone]) on 09/08/2019 9:04:45 AM   Radiology No results found.  Procedures Procedures (including critical care time)  Medications Ordered in ED Medications  sodium chloride 0.9 % bolus 1,000 mL (0 mLs Intravenous Hold 09/08/19 0957)     Initial Impression / Assessment and Plan / ED Course  I have reviewed the triage vital signs and the nursing notes.  Pertinent labs & imaging results that were available during my care of the patient were reviewed by me and considered in my medical decision making (see chart for details).        Labs and ekg reviewed and discussed.  Shortly after arrival in ED pt started feeling much better. Color improved, she had a snack here, received IV fluids (500cc started at the cancer center).  Ambulated in dept, normal orthostatics and sx free.  Called the NP with the cancer center to discuss plan.  They will call pt to reschedule her appt with them. Advised f/u with pcp to discuss possible need to change bp medication doses since sx seem to occur when she takes full doses of these meds.   Final Clinical Impressions(s) / ED Diagnoses   Final diagnoses:  Syncope, unspecified syncope type    ED Discharge Orders    None       Landis Martins 09/08/19 1223    Noemi Chapel, MD 09/09/19 [phone]

## 2019-09-08 NOTE — ED Triage Notes (Signed)
PT from cancer center.  Daughter reports pt had been fine up until she was sitting in the cancer center a few min ago.  Reports she stated she didn't feel well and was unresponsive breifly.  Staff reports manual bp was 70 systolic and HR in 62'I.  Pt pale, lying on stretcher with eyes closed but arousable.   Reports last chemo treatment was 3 weeks ago for metastatic lung ca.  Was supposed to get a treatment today.  Family says pt has taken her bp medication this morning.

## 2019-09-10 ENCOUNTER — Ambulatory Visit (HOSPITAL_COMMUNITY): Payer: Medicare Other

## 2019-09-11 ENCOUNTER — Ambulatory Visit (INDEPENDENT_AMBULATORY_CARE_PROVIDER_SITE_OTHER): Payer: Medicare Other | Admitting: Family Medicine

## 2019-09-11 ENCOUNTER — Encounter: Payer: Self-pay | Admitting: Family Medicine

## 2019-09-11 ENCOUNTER — Other Ambulatory Visit: Payer: Self-pay

## 2019-09-11 VITALS — BP 136/78 | HR 84 | Temp 98.2°F | Resp 16 | Ht 66.5 in | Wt 167.2 lb

## 2019-09-11 DIAGNOSIS — I10 Essential (primary) hypertension: Secondary | ICD-10-CM | POA: Diagnosis not present

## 2019-09-11 DIAGNOSIS — Z23 Encounter for immunization: Secondary | ICD-10-CM | POA: Diagnosis not present

## 2019-09-11 DIAGNOSIS — R609 Edema, unspecified: Secondary | ICD-10-CM

## 2019-09-11 NOTE — Assessment & Plan Note (Signed)
Viewed her chart.  She did have some hypotensive blood pressures at the cancer center but normal elsewhere.  She states they have been up and down at home.  We will go ahead and discontinue the carvedilol in the setting of her recent syncope her daughter states that her heart rate had dropped some when she was in the emergency room.  She will continue the losartan 25 mg total a day for now.  They will check the blood pressure at home and send [place] readings in a couple of weeks.  I will defer her labs to the cancer center she needs to have metabolic panel drawn in 1 week.  She feels well and back at her baseline right now.  Regarding the peripheral edema think some of this is dependent edema she does have metastatic cancer.  We will have her use compression hose prefer not to put her on a diuretic so that she can keep up with her fluid intake.  Her daughter is going to get the compression hose

## 2019-09-11 NOTE — Patient Instructions (Addendum)
Stop the Coreg  Losartan 25mg  once a day  We will call in 2 weeks  F/u 3 MONTHS

## 2019-09-11 NOTE — Progress Notes (Signed)
Subjective:    Patient ID: Lindsey Mullins, female    DOB: [birth year], 73 y.o.   MRN: [id number]  Patient presents for Hospitalization Follow-up  Pt was sitting in the waiting room at the [facility] cancer center. She complained of feeling hot/warm, proceeded to wheel her to an exam room and she passed out in the chair.  She was then directed over to the emergency room.  Labs are drawn EKG which were fairly unremarkable no CT of head chest x-ray was needed.  She was back to her normal state in the emergency room her blood pressure was initially low she was hypotensive and it rebounded back up.  Her labs did show hypokalemia she was given potassium in the ER prior to discharge.  She has continued to take her losartan she typically takes a fourth of a tablet in the morning the other 1/4 around midday.  She also been on carvedilol No CP  Or other complaint is some swelling that she is getting in her ankles the past couple weeks.  No leg pain.  Does go down after she elevates the feet or first thing in the morning  She has wrist monitor at home  Review Of Systems:  GEN- denies fatigue, fever, weight loss,weakness, recent illness HEENT- denies eye drainage, change in vision, nasal discharge, CVS- denies chest pain, palpitations RESP- denies SOB, cough, wheeze ABD- denies N/V, change in stools, abd pain GU- denies dysuria, hematuria, dribbling, incontinence MSK- denies joint pain, muscle aches, injury Neuro- denies headache, dizziness, syncope, seizure activity       Objective:    BP 136/78   Pulse 84   Temp 98.2 F (36.8 C) (Oral)   Resp 16   Ht 5' 6.5" (1.689 m)   Wt 167 lb 4 oz (75.9 kg)   SpO2 99%   BMI 26.59 kg/m  GEN- NAD, alert and oriented x3 HEENT- PERRL, EOMI, non injected sclera, pink conjunctiva, MMM, oropharynx clear Neck- Supple, no thyromegaly CVS- RRR, no murmur RESP-CTAB ABD-NABS,soft,NT,ND EXT- trace ankle  edema Pulses- Radial, DP- 2+        Assessment & Plan:       Problem List Items Addressed This Visit      Unprioritized   Essential hypertension, benign - Primary    Viewed her chart.  She did have some hypotensive blood pressures at the cancer center but normal elsewhere.  She states they have been up and down at home.  We will go ahead and discontinue the carvedilol in the setting of her recent syncope her daughter states that her heart rate had dropped some when she was in the emergency room.  She will continue the losartan 25 mg total a day for now.  They will check the blood pressure at home and send [place] readings in a couple of weeks.  I will defer her labs to the cancer center she needs to have metabolic panel drawn in 1 week.  She feels well and back at her baseline right now.  Regarding the peripheral edema think some of this is dependent edema she does have metastatic cancer.  We will have her use compression hose prefer not to put her on a diuretic so that she can keep up with her fluid intake.  Her daughter is going to get the compression hose      Peripheral edema    Other Visit Diagnoses    Need for immunization against influenza       Relevant Orders  Flu Vaccine QUAD High Dose(Fluad) (Completed)      Note: This dictation was prepared with Dragon dictation along with smaller phrase technology. Any transcriptional errors that result from this process are unintentional.

## 2019-09-16 ENCOUNTER — Encounter (HOSPITAL_COMMUNITY): Payer: Self-pay | Admitting: Hematology

## 2019-09-16 ENCOUNTER — Inpatient Hospital Stay (HOSPITAL_COMMUNITY): Payer: Medicare Other

## 2019-09-16 ENCOUNTER — Other Ambulatory Visit: Payer: Self-pay

## 2019-09-16 ENCOUNTER — Inpatient Hospital Stay (HOSPITAL_BASED_OUTPATIENT_CLINIC_OR_DEPARTMENT_OTHER): Payer: Medicare Other | Admitting: Hematology

## 2019-09-16 VITALS — BP 133/70 | HR 77 | Temp 97.2°F | Resp 18

## 2019-09-16 DIAGNOSIS — C3491 Malignant neoplasm of unspecified part of right bronchus or lung: Secondary | ICD-10-CM

## 2019-09-16 DIAGNOSIS — C7800 Secondary malignant neoplasm of unspecified lung: Secondary | ICD-10-CM

## 2019-09-16 DIAGNOSIS — C7951 Secondary malignant neoplasm of bone: Secondary | ICD-10-CM | POA: Diagnosis not present

## 2019-09-16 LAB — CBC WITH DIFFERENTIAL/PLATELET
Abs Immature Granulocytes: 0.02 10*3/uL (ref 0.00–0.07)
Basophils Absolute: 0 10*3/uL (ref 0.0–0.1)
Basophils Relative: 1 %
Eosinophils Absolute: 0.7 10*3/uL — ABNORMAL HIGH (ref 0.0–0.5)
Eosinophils Relative: 13 %
HCT: 30.9 % — ABNORMAL LOW (ref 36.0–46.0)
Hemoglobin: 9.7 g/dL — ABNORMAL LOW (ref 12.0–15.0)
Immature Granulocytes: 0 %
Lymphocytes Relative: 30 %
Lymphs Abs: 1.7 10*3/uL (ref 0.7–4.0)
MCH: 31.6 pg (ref 26.0–34.0)
MCHC: 31.4 g/dL (ref 30.0–36.0)
MCV: 100.7 fL — ABNORMAL HIGH (ref 80.0–100.0)
Monocytes Absolute: 0.6 10*3/uL (ref 0.1–1.0)
Monocytes Relative: 10 %
Neutro Abs: 2.6 10*3/uL (ref 1.7–7.7)
Neutrophils Relative %: 46 %
Platelets: 285 10*3/uL (ref 150–400)
RBC: 3.07 MIL/uL — ABNORMAL LOW (ref 3.87–5.11)
RDW: 18.2 % — ABNORMAL HIGH (ref 11.5–15.5)
WBC: 5.7 10*3/uL (ref 4.0–10.5)
nRBC: 0 % (ref 0.0–0.2)

## 2019-09-16 LAB — COMPREHENSIVE METABOLIC PANEL
ALT: 13 U/L (ref 0–44)
AST: 26 U/L (ref 15–41)
Albumin: 4 g/dL (ref 3.5–5.0)
Alkaline Phosphatase: 127 U/L — ABNORMAL HIGH (ref 38–126)
Anion gap: 11 (ref 5–15)
BUN: 8 mg/dL (ref 8–23)
CO2: 23 mmol/L (ref 22–32)
Calcium: 9.3 mg/dL (ref 8.9–10.3)
Chloride: 105 mmol/L (ref 98–111)
Creatinine, Ser: 0.67 mg/dL (ref 0.44–1.00)
GFR calc Af Amer: >60 mL/min (ref >60)
GFR calc non Af Amer: >60 mL/min (ref >60)
Glucose, Bld: 104 mg/dL — ABNORMAL HIGH (ref 70–99)
Potassium: 3.3 mmol/L — ABNORMAL LOW (ref 3.5–5.1)
Sodium: 139 mmol/L (ref 135–145)
Total Bilirubin: 0.5 mg/dL (ref 0.3–1.2)
Total Protein: 7.3 g/dL (ref 6.5–8.1)

## 2019-09-16 MED ORDER — SODIUM CHLORIDE 0.9 % IV SOLN
Freq: Once | INTRAVENOUS | Status: AC
  Administered 2019-09-16: 10:00:00 via INTRAVENOUS

## 2019-09-16 MED ORDER — SODIUM CHLORIDE 0.9 % IV SOLN
200.0000 mg | Freq: Once | INTRAVENOUS | Status: AC
  Administered 2019-09-16: 10:00:00 200 mg via INTRAVENOUS
  Filled 2019-09-16: qty 8

## 2019-09-16 MED ORDER — SODIUM CHLORIDE 0.9% FLUSH
10.0000 mL | INTRAVENOUS | Status: DC | PRN
  Administered 2019-09-16: 10 mL
  Filled 2019-09-16: qty 10

## 2019-09-16 MED ORDER — HEPARIN SOD (PORK) LOCK FLUSH 100 UNIT/ML IV SOLN
500.0000 [IU] | Freq: Once | INTRAVENOUS | Status: AC | PRN
  Administered 2019-09-16: 500 [IU]

## 2019-09-16 NOTE — Assessment & Plan Note (Signed)
1.  Metastatic poorly differentiated squamous cell carcinoma of the lung: -PDL 1 TPS-10%, foundation 1 MS-stable, no other targetable mutations. -PET scan on 05/08/2019 showed hypermetabolic right hilar, mediastinal, right supraclavicular adenopathy with solitary small hypermetabolic nodule in the right lung measuring 1 cm.  Focal activity in the right posterior oropharynx with no CT correlate.  Widespread hypermetabolic skeletal metastasis and solitary splenic metastasis.  MRI of the brain negative. -Right supraclavicular lymph node biopsy on 05/20/2019 shows metastatic poorly differentiated squamous cell carcinoma. -4 cycles of carboplatin, paclitaxel and pembrolizumab from 06/15/2019 through 08/18/2019.  -CT CAP on 08/14/2019 showed right lower lobe pulmonary nodule similar in size.  Splenic lesions slightly regressed.  New pathological compression fracture at L3.  Old pathological compression fracture at L2. -She reportedly had a syncopal episode when she came to our clinic last week for her next cycle.  She was evaluated in the ER and no significant problems were found.  She was seen by Dr.[place] and carvedilol was discontinued. -Today she feels much better.  She will proceed with maintenance pembrolizumab.  I have reviewed her labs. -I will see her back in 3 weeks for follow-up.  2.  Taxane-induced leg pains: -She will continue gabapentin as needed.  3.  Constipation: -She will continue MiraLAX and stool softener.  4.  Low back pain: -She had steroid injections.  This is fairly well controlled at this time.

## 2019-09-16 NOTE — Progress Notes (Signed)
Labs reviewed at office visit today. Proceed with treatment today per MD.   Treatment given per orders. Patient tolerated it well without problems. Vitals stable and discharged home from clinic ambulatory. Follow up as scheduled.

## 2019-09-16 NOTE — Patient Instructions (Signed)
[facility] Discharge Instructions for Patients Receiving Chemotherapy  Today you received the following chemotherapy agents   To help prevent nausea and vomiting after your treatment, we encourage you to take your nausea medication   If you develop nausea and vomiting that is not controlled by your nausea medication, call the clinic.   BELOW ARE SYMPTOMS THAT SHOULD BE REPORTED IMMEDIATELY:  *FEVER GREATER THAN 100.5 F  *CHILLS WITH OR WITHOUT FEVER  NAUSEA AND VOMITING THAT IS NOT CONTROLLED WITH YOUR NAUSEA MEDICATION  *UNUSUAL SHORTNESS OF BREATH  *UNUSUAL BRUISING OR BLEEDING  TENDERNESS IN MOUTH AND THROAT WITH OR WITHOUT PRESENCE OF ULCERS  *URINARY PROBLEMS  *BOWEL PROBLEMS  UNUSUAL RASH Items with * indicate a potential emergency and should be followed up as soon as possible.  Feel free to call the clinic should you have any questions or concerns. The clinic phone number is [phone].  Please show the CHEMO ALERT CARD at check-in to the Emergency Department and triage nurse.   

## 2019-09-16 NOTE — Progress Notes (Signed)
[place], [place] [address]   CLINIC:  Medical Oncology/Hematology  PCP:  [name], MD [address] [place] [address] [phone]   REASON FOR VISIT: Metastatic squamous cell lung cancer.   INTERVAL HISTORY:  Ms. Happel 73 y.o. female seen for follow-up of metastatic lung cancer.  She received cycle 4 on 08/18/2019.  She presented to our clinic last week and had a syncopal episode.  She was then transferred to the ER and further work-up was done.  One of her blood pressure medications carvedilol was discontinued.  She has been feeling very well since then.  She does report numbness in the extremities which is constant.  Denies any GI side effects including nausea, vomiting, diarrhea or constipation.  REVIEW OF SYSTEMS:  Review of Systems  Neurological: Positive for numbness.  Psychiatric/Behavioral: Positive for sleep disturbance.  All other systems reviewed and are negative.    PAST MEDICAL/SURGICAL HISTORY:  Past Medical History:  Diagnosis Date  . Atypical chest pain 05/23/2012   STRESS TEST - small to moderate sized area of partial reversibility of the anteroapical wall, most likely breast artifact, post-stress EF 67%, EKG show NSR at 65, No Lexiscan EKG changes, non-diagnostic for ischemia; STRESS TEST, 01/25/2010 - normal study, post-stress EF 66%, no significant ischemia  . Cancer ([place])   . Cerebral atherosclerosis 09/29/2012   CAROTID DUPLEX - RIGHT  BULB/PROXIMAL ICA-moderate amount of fibrous plaque, 50-69% diameter reduction; LEFT CEA-normal, no significant diameter reduction  . Colitis   . GERD (gastroesophageal reflux disease)   . Hyperlipidemia   . Hypertension   . Osteopenia   . Restless leg syndrome   . Shortness of breath 03/30/2005   2D ECHO - EF >55%, normal  . TIA (transient ischemic attack) 01/27/2010   2D ECHO - EF 65%, normal   Past Surgical History:  Procedure Laterality Date  .  ABDOMINAL EXPLORATION SURGERY    . CAROTID ENDARTERECTOMY    . LYMPH NODE BIOPSY N/A 05/20/2019   Procedure: LYMPH NODE BIOPSY CERVICAL;  Surgeon: Aviva Signs, MD;  Location: [facility];  Service: General;  Laterality: N/A;  . Peripheral Vascular Catheterization  07/16/2005   Right verterbral-50% smooth segmental badsilar artery stenosis on right side, Right carotid-50% ulcerated proxmial stenosis, Left carotid-60 to 70% left externam carotid artery stenosis, 90% proximal left ICA stenosis  . PORTACATH PLACEMENT Left 06/12/2019   Procedure: INSERTION PORT-A-CATH (catheter attached left subclavian);  Surgeon: Aviva Signs, MD;  Location: [facility];  Service: General;  Laterality: Left;     SOCIAL HISTORY:  Social History   Socioeconomic History  . Marital status: Divorced    Spouse name: Not on file  . Number of children: 2  . Years of education: Not on file  . Highest education level: Not on file  Occupational History  . Not on file  Social Needs  . Financial resource strain: Not hard at all  . Food insecurity    Worry: Never true    Inability: Never true  . Transportation needs    Medical: No    Non-medical: No  Tobacco Use  . Smoking status: Former Smoker    Packs/day: 0.25    Years: 10.00    Pack years: 2.50    Types: Cigarettes    Quit date: 04/21/1983    Years since quitting: 36.4  . Smokeless tobacco: Never Used  Substance and Sexual Activity  . Alcohol use: No  .  Drug use: No  . Sexual activity: Not Currently  Lifestyle  . Physical activity    Days per week: 0 days    Minutes per session: 0 min  . Stress: Not at all  Relationships  . Social Herbalist on phone: Twice a week    Gets together: Twice a week    Attends religious service: More than 4 times per year    Active member of club or organization: No    Attends meetings of clubs or organizations: Never    Relationship status: Divorced  . Intimate partner violence    Fear of current or ex partner: No     Emotionally abused: No    Physically abused: No    Forced sexual activity: No  Other Topics Concern  . Not on file  Social History Narrative  . Not on file    FAMILY HISTORY:  Family History  Problem Relation Age of Onset  . Heart disease Mother   . Hypertension Mother   . Hypertension Father   . Heart disease Father     CURRENT MEDICATIONS:  Outpatient Encounter Medications as of 09/16/2019  Medication Sig  . aspirin 81 MG tablet Take 81 mg by mouth at bedtime.   . gabapentin (NEURONTIN) 100 MG capsule Take 2 capsules (200 mg total) by mouth at bedtime.  [name] loratadine (CLARITIN) 10 MG tablet Take 10 mg by mouth daily as needed for allergies or itching.   . losartan (COZAAR) 50 MG tablet Take 1/2 (one-half) tablet by mouth once daily (Patient taking differently: Take 25 mg by mouth daily. )  . Omega-3 Krill Oil 500 MG CAPS Take 500 mg by mouth daily.   [name] omeprazole (PRILOSEC) 20 MG capsule Take 1 po daily (Patient taking differently: Take 20 mg by mouth daily. )  . senna (SENOKOT) 8.6 MG TABS tablet Take 2 tablets by mouth daily.  . simvastatin (ZOCOR) 20 MG tablet Take 1 tablet (20 mg total) by mouth at bedtime.  . naproxen sodium (ALEVE) 220 MG tablet Take 220 mg by mouth 2 (two) times daily as needed (pain).   . nitroGLYCERIN (NITROSTAT) 0.4 MG SL tablet DISSOLVE ONE TABLET UNDER THE TONGUE AS NEEDED FOR CHEST PAIN**MAX 3 TABLETS EACH 5 MINUTES APART** (Patient not taking: Reported on 09/11/2019)  . polyethylene glycol powder (GLYCOLAX/MIRALAX) 17 GM/SCOOP powder Take 17 g by mouth 2 (two) times daily. Until daily soft stools  OTC (Patient not taking: Reported on 09/16/2019)   No facility-administered encounter medications on file as of 09/16/2019.     ALLERGIES:  Allergies  Allergen Reactions  . Penicillins Shortness Of Breath and Other (See Comments)    Has patient had a PCN reaction causing immediate rash, facial/tongue/throat swelling, SOB or lightheadedness with  hypotension: Yes Has patient had a PCN reaction causing severe rash involving mucus membranes or skin necrosis: Unk Has patient had a PCN reaction that required hospitalization: Unk Has patient had a PCN reaction occurring within the last 10 years: Yes "States that she was in ICU after being administered"   . Codeine Nausea And Vomiting  . Lipitor [Atorvastatin] Other (See Comments)    Myalgias  . Lisinopril Cough  . Pravastatin Other (See Comments)    Myalgias     PHYSICAL EXAM:  ECOG Performance status: 1  Vitals:   09/16/19 0847  BP: 109/60  Pulse: 95  Resp: 18  Temp: (!) 97.3 F (36.3 C)  SpO2: 100%   Filed Weights  09/16/19 0847  Weight: 167 lb (75.8 kg)    Physical Exam Constitutional:      Appearance: Normal appearance. She is normal weight.  Cardiovascular:     Rate and Rhythm: Normal rate and regular rhythm.     Heart sounds: Normal heart sounds.  Pulmonary:     Effort: Pulmonary effort is normal.     Breath sounds: Normal breath sounds.  Abdominal:     General: Bowel sounds are normal.     Palpations: Abdomen is soft.  Musculoskeletal: Normal range of motion.  Skin:    General: Skin is warm and dry.  Neurological:     Mental Status: She is alert and oriented to person, place, and time. Mental status is at baseline.  Psychiatric:        Mood and Affect: Mood normal.        Behavior: Behavior normal.        Thought Content: Thought content normal.        Judgment: Judgment normal.      LABORATORY DATA:  I have reviewed the labs as listed.  CBC    Component Value Date/Time   WBC 5.7 09/16/2019 0822   RBC 3.07 (L) 09/16/2019 0822   HGB 9.7 (L) 09/16/2019 0822   HCT 30.9 (L) 09/16/2019 0822   PLT 285 09/16/2019 0822   MCV 100.7 (H) 09/16/2019 0822   MCH 31.6 09/16/2019 0822   MCHC 31.4 09/16/2019 0822   RDW 18.2 (H) 09/16/2019 0822   LYMPHSABS 1.7 09/16/2019 0822   MONOABS 0.6 09/16/2019 0822   EOSABS 0.7 (H) 09/16/2019 0822   BASOSABS  0.0 09/16/2019 0822   CMP Latest Ref Rng & Units 09/16/2019 09/08/2019 08/18/2019  Glucose 70 - 99 mg/dL 104(H) 97 106(H)  BUN 8 - 23 mg/dL 8 10 10   Creatinine 0.44 - 1.00 mg/dL 0.67 0.60 0.66  Sodium 135 - 145 mmol/L 139 139 141  Potassium 3.5 - 5.1 mmol/L 3.3(L) 3.3(L) 3.3(L)  Chloride 98 - 111 mmol/L 105 108 106  CO2 22 - 32 mmol/L 23 23 25   Calcium 8.9 - 10.3 mg/dL 9.3 8.5(L) 9.0  Total Protein 6.5 - 8.1 g/dL 7.3 6.1(L) 6.5  Total Bilirubin 0.3 - 1.2 mg/dL 0.5 0.2(L) 0.6  Alkaline Phos 38 - 126 U/L 127(H) 105 122  AST 15 - 41 U/L 26 19 24   ALT 0 - 44 U/L 13 12 16        DIAGNOSTIC IMAGING:  I have independently reviewed the scans and discussed with the patient.   ASSESSMENT & PLAN:   Squamous cell carcinoma of lung, stage IV, right ([place]) 1.  Metastatic poorly differentiated squamous cell carcinoma of the lung: -PDL 1 TPS-10%, foundation 1 MS-stable, no other targetable mutations. -PET scan on 05/08/2019 showed hypermetabolic right hilar, mediastinal, right supraclavicular adenopathy with solitary small hypermetabolic nodule in the right lung measuring 1 cm.  Focal activity in the right posterior oropharynx with no CT correlate.  Widespread hypermetabolic skeletal metastasis and solitary splenic metastasis.  MRI of the brain negative. -Right supraclavicular lymph node biopsy on 05/20/2019 shows metastatic poorly differentiated squamous cell carcinoma. -4 cycles of carboplatin, paclitaxel and pembrolizumab from 06/15/2019 through 08/18/2019.  -CT CAP on 08/14/2019 showed right lower lobe pulmonary nodule similar in size.  Splenic lesions slightly regressed.  New pathological compression fracture at L3.  Old pathological compression fracture at L2. -She reportedly had a syncopal episode when she came to our clinic last week for her next cycle.  She was  evaluated in the ER and no significant problems were found.  She was seen by Dr. and carvedilol was discontinued. -Today she feels  much better.  She will proceed with maintenance pembrolizumab.  I have reviewed her labs. -I will see her back in 3 weeks for follow-up.  2.  Taxane-induced leg pains: -She will continue gabapentin as needed.  3.  Constipation: -She will continue MiraLAX and stool softener.  4.  Low back pain: -She had steroid injections.  This is fairly well controlled at this time.  Total time spent is 25 minutes with more than 50% of the time spent face-to-face discussing treatment plan, counseling and coordination of care.  Orders placed this encounter:  No orders of the defined types were placed in this encounter.     Derek Jack, MD [place] [phone]

## 2019-09-16 NOTE — Patient Instructions (Signed)
[facility] at [facility] Discharge Instructions  You were seen today by Dr. Katragadda. He went over your recent lab results. He will see you back in  for labs and follow up.   Thank you for choosing [facility] at [facility] to provide your oncology and hematology care.  To afford each patient quality time with our provider, please arrive at least 15 minutes before your scheduled appointment time.   If you have a lab appointment with the Cancer Center please come in thru the  Main Entrance and check in at the main information desk  You need to re-schedule your appointment should you arrive 10 or more minutes late.  We strive to give you quality time with our providers, and arriving late affects you and other patients whose appointments are after yours.  Also, if you no show three or more times for appointments you may be dismissed from the clinic at the providers discretion.     Again, thank you for choosing [facility].  Our hope is that these requests will decrease the amount of time that you wait before being seen by our physicians.       _____________________________________________________________  Should you have questions after your visit to [facility], please contact our office at [phone] between the hours of 8:00 a.m. and 4:30 p.m.  Voicemails left after 4:00 p.m. will not be returned until the following business day.  For prescription refill requests, have your pharmacy contact our office and allow 72 hours.    Cancer Center Support Programs:   > Cancer Support Group  2nd Tuesday of the month 1pm-2pm, Journey Room    

## 2019-09-18 ENCOUNTER — Ambulatory Visit (HOSPITAL_COMMUNITY): Payer: Medicare Other

## 2019-09-28 ENCOUNTER — Encounter (HOSPITAL_COMMUNITY): Payer: Self-pay | Admitting: *Deleted

## 2019-10-07 ENCOUNTER — Inpatient Hospital Stay (HOSPITAL_COMMUNITY): Payer: Medicare Other

## 2019-10-07 ENCOUNTER — Other Ambulatory Visit: Payer: Self-pay

## 2019-10-07 ENCOUNTER — Inpatient Hospital Stay (HOSPITAL_COMMUNITY): Payer: Medicare Other | Attending: Hematology

## 2019-10-07 ENCOUNTER — Encounter (HOSPITAL_COMMUNITY): Payer: Self-pay | Admitting: Hematology

## 2019-10-07 ENCOUNTER — Inpatient Hospital Stay (HOSPITAL_BASED_OUTPATIENT_CLINIC_OR_DEPARTMENT_OTHER): Payer: Medicare Other | Admitting: Hematology

## 2019-10-07 VITALS — BP 158/86 | HR 75 | Temp 97.1°F | Resp 18

## 2019-10-07 VITALS — BP 143/84 | HR 80 | Temp 96.9°F | Resp 18 | Wt 167.0 lb

## 2019-10-07 DIAGNOSIS — K219 Gastro-esophageal reflux disease without esophagitis: Secondary | ICD-10-CM | POA: Insufficient documentation

## 2019-10-07 DIAGNOSIS — M545 Low back pain: Secondary | ICD-10-CM | POA: Insufficient documentation

## 2019-10-07 DIAGNOSIS — M8580 Other specified disorders of bone density and structure, unspecified site: Secondary | ICD-10-CM | POA: Diagnosis not present

## 2019-10-07 DIAGNOSIS — I10 Essential (primary) hypertension: Secondary | ICD-10-CM | POA: Diagnosis not present

## 2019-10-07 DIAGNOSIS — C7800 Secondary malignant neoplasm of unspecified lung: Secondary | ICD-10-CM

## 2019-10-07 DIAGNOSIS — Z5112 Encounter for antineoplastic immunotherapy: Secondary | ICD-10-CM | POA: Diagnosis present

## 2019-10-07 DIAGNOSIS — Z87891 Personal history of nicotine dependence: Secondary | ICD-10-CM | POA: Insufficient documentation

## 2019-10-07 DIAGNOSIS — R079 Chest pain, unspecified: Secondary | ICD-10-CM | POA: Diagnosis not present

## 2019-10-07 DIAGNOSIS — Z79899 Other long term (current) drug therapy: Secondary | ICD-10-CM | POA: Insufficient documentation

## 2019-10-07 DIAGNOSIS — K5900 Constipation, unspecified: Secondary | ICD-10-CM | POA: Diagnosis not present

## 2019-10-07 DIAGNOSIS — E785 Hyperlipidemia, unspecified: Secondary | ICD-10-CM | POA: Insufficient documentation

## 2019-10-07 DIAGNOSIS — C3491 Malignant neoplasm of unspecified part of right bronchus or lung: Secondary | ICD-10-CM | POA: Insufficient documentation

## 2019-10-07 DIAGNOSIS — G4730 Sleep apnea, unspecified: Secondary | ICD-10-CM | POA: Insufficient documentation

## 2019-10-07 DIAGNOSIS — C799 Secondary malignant neoplasm of unspecified site: Secondary | ICD-10-CM

## 2019-10-07 DIAGNOSIS — M7989 Other specified soft tissue disorders: Secondary | ICD-10-CM | POA: Insufficient documentation

## 2019-10-07 LAB — COMPREHENSIVE METABOLIC PANEL
ALT: 10 U/L (ref 0–44)
AST: 24 U/L (ref 15–41)
Albumin: 3.7 g/dL (ref 3.5–5.0)
Alkaline Phosphatase: 112 U/L (ref 38–126)
Anion gap: 13 (ref 5–15)
BUN: 9 mg/dL (ref 8–23)
CO2: 21 mmol/L — ABNORMAL LOW (ref 22–32)
Calcium: 9.1 mg/dL (ref 8.9–10.3)
Chloride: 105 mmol/L (ref 98–111)
Creatinine, Ser: 0.73 mg/dL (ref 0.44–1.00)
GFR calc Af Amer: >60 mL/min (ref >60)
GFR calc non Af Amer: >60 mL/min (ref >60)
Glucose, Bld: 110 mg/dL — ABNORMAL HIGH (ref 70–99)
Potassium: 3.3 mmol/L — ABNORMAL LOW (ref 3.5–5.1)
Sodium: 139 mmol/L (ref 135–145)
Total Bilirubin: 0.5 mg/dL (ref 0.3–1.2)
Total Protein: 7.2 g/dL (ref 6.5–8.1)

## 2019-10-07 LAB — CBC WITH DIFFERENTIAL/PLATELET
Abs Immature Granulocytes: 0.01 10*3/uL (ref 0.00–0.07)
Basophils Absolute: 0 10*3/uL (ref 0.0–0.1)
Basophils Relative: 1 %
Eosinophils Absolute: 0.4 10*3/uL (ref 0.0–0.5)
Eosinophils Relative: 8 %
HCT: 31.6 % — ABNORMAL LOW (ref 36.0–46.0)
Hemoglobin: 9.9 g/dL — ABNORMAL LOW (ref 12.0–15.0)
Immature Granulocytes: 0 %
Lymphocytes Relative: 36 %
Lymphs Abs: 1.8 10*3/uL (ref 0.7–4.0)
MCH: 31 pg (ref 26.0–34.0)
MCHC: 31.3 g/dL (ref 30.0–36.0)
MCV: 99.1 fL (ref 80.0–100.0)
Monocytes Absolute: 0.4 10*3/uL (ref 0.1–1.0)
Monocytes Relative: 8 %
Neutro Abs: 2.4 10*3/uL (ref 1.7–7.7)
Neutrophils Relative %: 47 %
Platelets: 256 10*3/uL (ref 150–400)
RBC: 3.19 MIL/uL — ABNORMAL LOW (ref 3.87–5.11)
RDW: 14.1 % (ref 11.5–15.5)
WBC: 5.1 10*3/uL (ref 4.0–10.5)
nRBC: 0 % (ref 0.0–0.2)

## 2019-10-07 LAB — TSH: TSH: 0.096 u[IU]/mL — ABNORMAL LOW (ref 0.350–4.500)

## 2019-10-07 MED ORDER — HEPARIN SOD (PORK) LOCK FLUSH 100 UNIT/ML IV SOLN
500.0000 [IU] | Freq: Once | INTRAVENOUS | Status: AC | PRN
  Administered 2019-10-07: 500 [IU]

## 2019-10-07 MED ORDER — SODIUM CHLORIDE 0.9 % IV SOLN
Freq: Once | INTRAVENOUS | Status: AC
  Administered 2019-10-07: 10:00:00 via INTRAVENOUS

## 2019-10-07 MED ORDER — SODIUM CHLORIDE 0.9% FLUSH
10.0000 mL | INTRAVENOUS | Status: AC | PRN
  Administered 2019-10-07: 10 mL
  Filled 2019-10-07: qty 10

## 2019-10-07 MED ORDER — SODIUM CHLORIDE 0.9 % IV SOLN
200.0000 mg | Freq: Once | INTRAVENOUS | Status: AC
  Administered 2019-10-07: 200 mg via INTRAVENOUS
  Filled 2019-10-07: qty 8

## 2019-10-07 NOTE — Patient Instructions (Signed)
[facility] Discharge Instructions for Patients Receiving Chemotherapy  Today you received the following chemotherapy agents   To help prevent nausea and vomiting after your treatment, we encourage you to take your nausea medication   If you develop nausea and vomiting that is not controlled by your nausea medication, call the clinic.   BELOW ARE SYMPTOMS THAT SHOULD BE REPORTED IMMEDIATELY:  *FEVER GREATER THAN 100.5 F  *CHILLS WITH OR WITHOUT FEVER  NAUSEA AND VOMITING THAT IS NOT CONTROLLED WITH YOUR NAUSEA MEDICATION  *UNUSUAL SHORTNESS OF BREATH  *UNUSUAL BRUISING OR BLEEDING  TENDERNESS IN MOUTH AND THROAT WITH OR WITHOUT PRESENCE OF ULCERS  *URINARY PROBLEMS  *BOWEL PROBLEMS  UNUSUAL RASH Items with * indicate a potential emergency and should be followed up as soon as possible.  Feel free to call the clinic should you have any questions or concerns. The clinic phone number is [phone].  Please show the CHEMO ALERT CARD at check-in to the Emergency Department and triage nurse.   

## 2019-10-07 NOTE — Assessment & Plan Note (Addendum)
1.  Metastatic poorly differentiated squamous cell carcinoma of the lung: -PD-L1 TPS-10%, foundation 1 MS-stable, no other targetable mutations. -PET scan on 05/08/2019-hypermetabolic right hilar, mediastinal, right supraclavicular adenopathy with solitary small hypermetabolic nodule in the right lung measuring 1 cm.  Focal activity in the right posterior oropharynx with no CT correlate.  Widespread hypermetabolic skeletal metastasis and solitary splenic metastasis.  MRI of the brain negative. -Right supraclavicular lymph node biopsy on 05/20/2019 shows metastatic poorly differentiated squamous cell carcinoma. -4 cycles of carboplatin, paclitaxel and pembrolizumab from 06/15/2019 through 08/18/2019. -CT CAP on 08/14/2019 showed right lower lobe pulmonary nodule similar in size.  Splenic lesions slightly regressed.  New pathological compression fracture at L3.  Old pathological compression fracture at L2. -Maintenance pembrolizumab started on 09/16/2019. -She denies any immunotherapy related side effects.  She has been eating well and maintaining her weight. -We will see her back in 3 weeks for follow-up.  I plan to repeat CT CAP prior to next visit.  2.  Taxane-induced leg pains: -She is taking gabapentin as needed.  Her pains have improved since she did not receive Taxol last time.  She also has some numbness which is stable.  3.  Constipation: -She will continue MiraLAX and stool softener.  4.  Low back pain: -She had steroid injections in the past.  Is fairly well controlled at this time.

## 2019-10-07 NOTE — Progress Notes (Signed)
Lindsey Mullins, [place] [address]   CLINIC:  Medical Oncology/Hematology  PCP:  [name], MD [address] [place] [address] [phone]   REASON FOR VISIT: Metastatic squamous cell lung cancer.   INTERVAL HISTORY:  Lindsey Mullins 73 y.o. female seen for follow-up of metastatic lung cancer.  She is started on maintenance pembrolizumab on 09/16/2019.  She reports appetite and energy levels at 75%.  Weight is stable.  She denies any nausea, vomiting, diarrhea or constipation.  Ankle swellings have been stable.  She is wearing compression stockings.  Numbness in the feet is also stable.  Low back pain is reported as 8 out of 10 when she moves.  REVIEW OF SYSTEMS:  Review of Systems  Cardiovascular: Positive for leg swelling.  Musculoskeletal: Positive for back pain.  Neurological: Positive for numbness.  Psychiatric/Behavioral: Positive for sleep disturbance.  All other systems reviewed and are negative.    PAST MEDICAL/SURGICAL HISTORY:  Past Medical History:  Diagnosis Date  . Atypical chest pain 05/23/2012   STRESS TEST - small to moderate sized area of partial reversibility of the anteroapical wall, most likely breast artifact, post-stress EF 67%, EKG show NSR at 65, No Lexiscan EKG changes, non-diagnostic for ischemia; STRESS TEST, 01/25/2010 - normal study, post-stress EF 66%, no significant ischemia  . Cancer ([facility])   . Cerebral atherosclerosis 09/29/2012   CAROTID DUPLEX - RIGHT  BULB/PROXIMAL ICA-moderate amount of fibrous plaque, 50-69% diameter reduction; LEFT CEA-normal, no significant diameter reduction  . Colitis   . GERD (gastroesophageal reflux disease)   . Hyperlipidemia   . Hypertension   . Osteopenia   . Restless leg syndrome   . Shortness of breath 03/30/2005   2D ECHO - EF >55%, normal  . TIA (transient ischemic attack) 01/27/2010   2D ECHO - EF 65%, normal   Past Surgical History:  Procedure  Laterality Date  . ABDOMINAL EXPLORATION SURGERY    . CAROTID ENDARTERECTOMY    . LYMPH NODE BIOPSY N/A 05/20/2019   Procedure: LYMPH NODE BIOPSY CERVICAL;  Surgeon: Aviva Signs, MD;  Location: [facility];  Service: General;  Laterality: N/A;  . Peripheral Vascular Catheterization  07/16/2005   Right verterbral-50% smooth segmental badsilar artery stenosis on right side, Right carotid-50% ulcerated proxmial stenosis, Left carotid-60 to 70% left externam carotid artery stenosis, 90% proximal left ICA stenosis  . PORTACATH PLACEMENT Left 06/12/2019   Procedure: INSERTION PORT-A-CATH (catheter attached left subclavian);  Surgeon: Aviva Signs, MD;  Location: [facility];  Service: General;  Laterality: Left;     SOCIAL HISTORY:  Social History   Socioeconomic History  . Marital status: Divorced    Spouse name: Not on file  . Number of children: 2  . Years of education: Not on file  . Highest education level: Not on file  Occupational History  . Not on file  Social Needs  . Financial resource strain: Not hard at all  . Food insecurity    Worry: Never true    Inability: Never true  . Transportation needs    Medical: No    Non-medical: No  Tobacco Use  . Smoking status: Former Smoker    Packs/day: 0.25    Years: 10.00    Pack years: 2.50    Types: Cigarettes    Quit date: 04/21/1983    Years since quitting: 36.4  . Smokeless tobacco: Never Used  Substance and Sexual Activity  . Alcohol  use: No  . Drug use: No  . Sexual activity: Not Currently  Lifestyle  . Physical activity    Days per week: 0 days    Minutes per session: 0 min  . Stress: Not at all  Relationships  . Social Herbalist on phone: Twice a week    Gets together: Twice a week    Attends religious service: More than 4 times per year    Active member of club or organization: No    Attends meetings of clubs or organizations: Never    Relationship status: Divorced  . Intimate partner violence    Fear of  current or ex partner: No    Emotionally abused: No    Physically abused: No    Forced sexual activity: No  Other Topics Concern  . Not on file  Social History Narrative  . Not on file    FAMILY HISTORY:  Family History  Problem Relation Age of Onset  . Heart disease Mother   . Hypertension Mother   . Hypertension Father   . Heart disease Father     CURRENT MEDICATIONS:  Outpatient Encounter Medications as of 10/07/2019  Medication Sig  . aspirin 81 MG tablet Take 81 mg by mouth at bedtime.   . gabapentin (NEURONTIN) 100 MG capsule Take 2 capsules (200 mg total) by mouth at bedtime.  [name] losartan (COZAAR) 50 MG tablet Take 1/2 (one-half) tablet by mouth once daily (Patient taking differently: Take 25 mg by mouth daily. )  . Omega-3 Krill Oil 500 MG CAPS Take 500 mg by mouth daily.   [name] omeprazole (PRILOSEC) 20 MG capsule Take 1 po daily (Patient taking differently: Take 20 mg by mouth daily. )  . senna (SENOKOT) 8.6 MG TABS tablet Take 2 tablets by mouth daily.  . simvastatin (ZOCOR) 20 MG tablet Take 1 tablet (20 mg total) by mouth at bedtime.  [name] loratadine (CLARITIN) 10 MG tablet Take 10 mg by mouth daily as needed for allergies or itching.   . naproxen sodium (ALEVE) 220 MG tablet Take 220 mg by mouth 2 (two) times daily as needed (pain).   . nitroGLYCERIN (NITROSTAT) 0.4 MG SL tablet DISSOLVE ONE TABLET UNDER THE TONGUE AS NEEDED FOR CHEST PAIN**MAX 3 TABLETS EACH 5 MINUTES APART** (Patient not taking: Reported on 09/11/2019)  . polyethylene glycol powder (GLYCOLAX/MIRALAX) 17 GM/SCOOP powder Take 17 g by mouth 2 (two) times daily. Until daily soft stools  OTC (Patient not taking: Reported on 09/16/2019)   No facility-administered encounter medications on file as of 10/07/2019.     ALLERGIES:  Allergies  Allergen Reactions  . Penicillins Shortness Of Breath and Other (See Comments)    Has patient had a PCN reaction causing immediate rash, facial/tongue/throat swelling, SOB  or lightheadedness with hypotension: Yes Has patient had a PCN reaction causing severe rash involving mucus membranes or skin necrosis: Unk Has patient had a PCN reaction that required hospitalization: Unk Has patient had a PCN reaction occurring within the last 10 years: Yes "States that she was in ICU after being administered"   . Codeine Nausea And Vomiting  . Lipitor [Atorvastatin] Other (See Comments)    Myalgias  . Lisinopril Cough  . Pravastatin Other (See Comments)    Myalgias     PHYSICAL EXAM:  ECOG Performance status: 1  Vitals:   10/07/19 0921  BP: (!) 143/84  Pulse: 80  Resp: 18  Temp: (!) 96.9 F (36.1 C)  SpO2: 100%  Filed Weights   10/07/19 0921  Weight: 167 lb (75.8 kg)    Physical Exam Constitutional:      Appearance: Normal appearance. She is normal weight.  Cardiovascular:     Rate and Rhythm: Normal rate and regular rhythm.     Heart sounds: Normal heart sounds.  Pulmonary:     Effort: Pulmonary effort is normal.     Breath sounds: Normal breath sounds.  Abdominal:     General: Bowel sounds are normal.     Palpations: Abdomen is soft.  Musculoskeletal: Normal range of motion.  Skin:    General: Skin is warm and dry.  Neurological:     Mental Status: She is alert and oriented to person, place, and time. Mental status is at baseline.  Psychiatric:        Mood and Affect: Mood normal.        Behavior: Behavior normal.        Thought Content: Thought content normal.        Judgment: Judgment normal.      LABORATORY DATA:  I have reviewed the labs as listed.  CBC    Component Value Date/Time   WBC 5.1 10/07/2019 0838   RBC 3.19 (L) 10/07/2019 0838   HGB 9.9 (L) 10/07/2019 0838   HCT 31.6 (L) 10/07/2019 0838   PLT 256 10/07/2019 0838   MCV 99.1 10/07/2019 0838   MCH 31.0 10/07/2019 0838   MCHC 31.3 10/07/2019 0838   RDW 14.1 10/07/2019 0838   LYMPHSABS 1.8 10/07/2019 0838   MONOABS 0.4 10/07/2019 0838   EOSABS 0.4 10/07/2019  0838   BASOSABS 0.0 10/07/2019 0838   CMP Latest Ref Rng & Units 10/07/2019 09/16/2019 09/08/2019  Glucose 70 - 99 mg/dL 110(H) 104(H) 97  BUN 8 - 23 mg/dL _0 Creatinine 0.44 - 1.00 mg/dL 0.73 0.67 0.60  Sodium 135 - 145 mmol/L 139 139 139  Potassium 3.5 - 5.1 mmol/L 3.3(L) 3.3(L) 3.3(L)  Chloride 98 - 111 mmol/L 105 105 108  CO2 22 - 32 mmol/L 21(L) 23 23  Calcium 8.9 - 10.3 mg/dL 9.1 9.3 8.5(L)  Total Protein 6.5 - 8.1 g/dL 7.2 7.3 6.1(L)  Total Bilirubin 0.3 - 1.2 mg/dL 0.5 0.5 0.2(L)  Alkaline Phos 38 - 126 U/L 112 127(H) 105  AST 15 - 41 U/L _1 ALT 0 - 44 U/L _2 DIAGNOSTIC IMAGING:  I have independently reviewed the scans and discussed with the patient.   ASSESSMENT & PLAN:   Squamous cell carcinoma of lung, stage IV, right ([place]) 1.  Metastatic poorly differentiated squamous cell carcinoma of the lung: -PD-L1 TPS-10%, foundation 1 MS-stable, no other targetable mutations. -PET scan on 05/08/2019-hypermetabolic right hilar, mediastinal, right supraclavicular adenopathy with solitary small hypermetabolic nodule in the right lung measuring 1 cm.  Focal activity in the right posterior oropharynx with no CT correlate.  Widespread hypermetabolic skeletal metastasis and solitary splenic metastasis.  MRI of the brain negative. -Right supraclavicular lymph node biopsy on 05/20/2019 shows metastatic poorly differentiated squamous cell carcinoma. -4 cycles of carboplatin, paclitaxel and pembrolizumab from 06/15/2019 through 08/18/2019. -CT CAP on 08/14/2019 showed right lower lobe pulmonary nodule similar in size.  Splenic lesions slightly regressed.  New pathological compression fracture at L3.  Old pathological compression fracture at L2. -Maintenance pembrolizumab started on 09/16/2019. -She denies any immunotherapy related side effects.  She has been eating well and maintaining her weight. -We will  see her back in 3 weeks for follow-up.  I plan to repeat CT CAP  prior to next visit.  2.  Taxane-induced leg pains: -She is taking gabapentin as needed.  Her pains have improved since she did not receive Taxol last time.  She also has some numbness which is stable.  3.  Constipation: -She will continue MiraLAX and stool softener.  4.  Low back pain: -She had steroid injections in the past.  Is fairly well controlled at this time.  Total time spent is 25 minutes with more than 50% of the time spent face-to-face discussing treatment plan, counseling and coordination of care.  Orders placed this encounter:  Orders Placed This Encounter  Procedures  . CT Chest W Contrast  . CT Abdomen Pelvis W Contrast  . CBC with Differential/Platelet  . Comprehensive metabolic panel      Derek Jack, MD [place] [phone]

## 2019-10-07 NOTE — Patient Instructions (Signed)
[place] at [facility] Discharge Instructions  You were seen today by Dr. Delton Coombes. He went over your recent lab results. He will see you back in 3 weeks for labs, treatment, repeat scans and follow up.   Thank you for choosing [place] at [facility] to provide your oncology and hematology care.  To afford each patient quality time with our provider, please arrive at least 15 minutes before your scheduled appointment time.   If you have a lab appointment with the [facility] please come in thru the  Main Entrance and check in at the main information desk  You need to re-schedule your appointment should you arrive 10 or more minutes late.  We strive to give you quality time with our providers, and arriving late affects you and other patients whose appointments are after yours.  Also, if you no show three or more times for appointments you may be dismissed from the clinic at the providers discretion.     Again, thank you for choosing [facility].  Our hope is that these requests will decrease the amount of time that you wait before being seen by our physicians.       _____________________________________________________________  Should you have questions after your visit to [facility], please contact our office at [phone] between the hours of 8:00 a.m. and 4:30 p.m.  Voicemails left after 4:00 p.m. will not be returned until the following business day.  For prescription refill requests, have your pharmacy contact our office and allow 72 hours.    Cancer Center Support Programs:   > Cancer Support Group  2nd Tuesday of the month 1pm-2pm, Journey Room

## 2019-10-07 NOTE — Progress Notes (Unsigned)
Patient presents today for treatment and f/u with Dr. Delton Coombes. MAR reviewed. Vital signs within parameters for treatment. Pt has no complaints of any significant changes since the last visit.   Message received from [facility] LPN to proceed with treatment. Labs reviewed.   Treatment given today per MD orders. Tolerated infusion without adverse affects. Vital signs stable. No complaints at this time. Discharged from clinic via wheel chair. F/U with [facility] as scheduled.

## 2019-10-20 ENCOUNTER — Telehealth: Payer: Self-pay | Admitting: Physical Medicine and Rehabilitation

## 2019-10-20 NOTE — Telephone Encounter (Signed)
ok 

## 2019-10-21 NOTE — Telephone Encounter (Signed)
Scheduled for 12/21 at 1000 with driver.

## 2019-10-26 ENCOUNTER — Ambulatory Visit (HOSPITAL_COMMUNITY)
Admission: RE | Admit: 2019-10-26 | Discharge: 2019-10-26 | Disposition: A | Discharge status: Home / Self Care | Payer: Medicare Other | Source: Ambulatory Visit | Attending: Hematology | Admitting: Hematology

## 2019-10-26 ENCOUNTER — Other Ambulatory Visit: Payer: Self-pay

## 2019-10-26 DIAGNOSIS — C3491 Malignant neoplasm of unspecified part of right bronchus or lung: Secondary | ICD-10-CM | POA: Diagnosis not present

## 2019-10-26 MED ORDER — IOHEXOL 300 MG/ML  SOLN
100.0000 mL | Freq: Once | INTRAMUSCULAR | Status: AC | PRN
  Administered 2019-10-26: 100 mL via INTRAVENOUS

## 2019-10-28 ENCOUNTER — Encounter (HOSPITAL_COMMUNITY): Payer: Self-pay | Admitting: Hematology

## 2019-10-28 ENCOUNTER — Other Ambulatory Visit (HOSPITAL_COMMUNITY): Payer: Medicare Other

## 2019-10-28 ENCOUNTER — Inpatient Hospital Stay (HOSPITAL_COMMUNITY): Payer: Medicare Other

## 2019-10-28 ENCOUNTER — Ambulatory Visit (HOSPITAL_COMMUNITY): Payer: Medicare Other

## 2019-10-28 ENCOUNTER — Inpatient Hospital Stay (HOSPITAL_COMMUNITY): Payer: Medicare Other | Attending: Hematology

## 2019-10-28 ENCOUNTER — Inpatient Hospital Stay (HOSPITAL_BASED_OUTPATIENT_CLINIC_OR_DEPARTMENT_OTHER): Payer: Medicare Other | Admitting: Hematology

## 2019-10-28 ENCOUNTER — Ambulatory Visit (HOSPITAL_COMMUNITY): Payer: Medicare Other | Admitting: Hematology

## 2019-10-28 ENCOUNTER — Other Ambulatory Visit: Payer: Self-pay

## 2019-10-28 VITALS — BP 118/62 | HR 69 | Temp 96.9°F | Resp 18

## 2019-10-28 VITALS — BP 136/89 | HR 121 | Temp 96.9°F | Resp 18 | Wt 170.3 lb

## 2019-10-28 DIAGNOSIS — Z87891 Personal history of nicotine dependence: Secondary | ICD-10-CM | POA: Diagnosis not present

## 2019-10-28 DIAGNOSIS — C7951 Secondary malignant neoplasm of bone: Secondary | ICD-10-CM | POA: Insufficient documentation

## 2019-10-28 DIAGNOSIS — I672 Cerebral atherosclerosis: Secondary | ICD-10-CM | POA: Insufficient documentation

## 2019-10-28 DIAGNOSIS — R944 Abnormal results of kidney function studies: Secondary | ICD-10-CM | POA: Diagnosis not present

## 2019-10-28 DIAGNOSIS — K219 Gastro-esophageal reflux disease without esophagitis: Secondary | ICD-10-CM | POA: Insufficient documentation

## 2019-10-28 DIAGNOSIS — C3491 Malignant neoplasm of unspecified part of right bronchus or lung: Secondary | ICD-10-CM | POA: Diagnosis not present

## 2019-10-28 DIAGNOSIS — Z79899 Other long term (current) drug therapy: Secondary | ICD-10-CM | POA: Insufficient documentation

## 2019-10-28 DIAGNOSIS — K5900 Constipation, unspecified: Secondary | ICD-10-CM | POA: Diagnosis not present

## 2019-10-28 DIAGNOSIS — Z8673 Personal history of transient ischemic attack (TIA), and cerebral infarction without residual deficits: Secondary | ICD-10-CM | POA: Diagnosis not present

## 2019-10-28 DIAGNOSIS — I10 Essential (primary) hypertension: Secondary | ICD-10-CM | POA: Diagnosis not present

## 2019-10-28 DIAGNOSIS — M545 Low back pain: Secondary | ICD-10-CM | POA: Diagnosis not present

## 2019-10-28 DIAGNOSIS — M8580 Other specified disorders of bone density and structure, unspecified site: Secondary | ICD-10-CM | POA: Diagnosis not present

## 2019-10-28 DIAGNOSIS — Z5112 Encounter for antineoplastic immunotherapy: Secondary | ICD-10-CM | POA: Insufficient documentation

## 2019-10-28 DIAGNOSIS — E785 Hyperlipidemia, unspecified: Secondary | ICD-10-CM | POA: Insufficient documentation

## 2019-10-28 DIAGNOSIS — C7800 Secondary malignant neoplasm of unspecified lung: Secondary | ICD-10-CM

## 2019-10-28 LAB — COMPREHENSIVE METABOLIC PANEL
ALT: 14 U/L (ref 0–44)
AST: 31 U/L (ref 15–41)
Albumin: 4 g/dL (ref 3.5–5.0)
Alkaline Phosphatase: 99 U/L (ref 38–126)
Anion gap: 14 (ref 5–15)
BUN: 10 mg/dL (ref 8–23)
CO2: 23 mmol/L (ref 22–32)
Calcium: 9.2 mg/dL (ref 8.9–10.3)
Chloride: 102 mmol/L (ref 98–111)
Creatinine, Ser: 0.91 mg/dL (ref 0.44–1.00)
GFR calc Af Amer: >60 mL/min (ref >60)
GFR calc non Af Amer: >60 mL/min (ref >60)
Glucose, Bld: 93 mg/dL (ref 70–99)
Potassium: 3.4 mmol/L — ABNORMAL LOW (ref 3.5–5.1)
Sodium: 139 mmol/L (ref 135–145)
Total Bilirubin: 0.5 mg/dL (ref 0.3–1.2)
Total Protein: 7.3 g/dL (ref 6.5–8.1)

## 2019-10-28 LAB — CBC WITH DIFFERENTIAL/PLATELET
Abs Immature Granulocytes: 0.01 10*3/uL (ref 0.00–0.07)
Basophils Absolute: 0 10*3/uL (ref 0.0–0.1)
Basophils Relative: 1 %
Eosinophils Absolute: 0.3 10*3/uL (ref 0.0–0.5)
Eosinophils Relative: 5 %
HCT: 35.9 % — ABNORMAL LOW (ref 36.0–46.0)
Hemoglobin: 11.2 g/dL — ABNORMAL LOW (ref 12.0–15.0)
Immature Granulocytes: 0 %
Lymphocytes Relative: 39 %
Lymphs Abs: 2.2 10*3/uL (ref 0.7–4.0)
MCH: 30.4 pg (ref 26.0–34.0)
MCHC: 31.2 g/dL (ref 30.0–36.0)
MCV: 97.6 fL (ref 80.0–100.0)
Monocytes Absolute: 0.4 10*3/uL (ref 0.1–1.0)
Monocytes Relative: 6 %
Neutro Abs: 2.8 10*3/uL (ref 1.7–7.7)
Neutrophils Relative %: 49 %
Platelets: 274 10*3/uL (ref 150–400)
RBC: 3.68 MIL/uL — ABNORMAL LOW (ref 3.87–5.11)
RDW: 13.6 % (ref 11.5–15.5)
WBC: 5.7 10*3/uL (ref 4.0–10.5)
nRBC: 0 % (ref 0.0–0.2)

## 2019-10-28 MED ORDER — SODIUM CHLORIDE 0.9 % IV SOLN
200.0000 mg | Freq: Once | INTRAVENOUS | Status: AC
  Administered 2019-10-28: 200 mg via INTRAVENOUS
  Filled 2019-10-28: qty 8

## 2019-10-28 MED ORDER — SODIUM CHLORIDE 0.9 % IV SOLN
Freq: Once | INTRAVENOUS | Status: AC
  Administered 2019-10-28: 10:00:00 via INTRAVENOUS

## 2019-10-28 MED ORDER — SODIUM CHLORIDE 0.9% FLUSH
10.0000 mL | INTRAVENOUS | Status: DC | PRN
  Administered 2019-10-28: 10 mL
  Filled 2019-10-28: qty 10

## 2019-10-28 MED ORDER — HEPARIN SOD (PORK) LOCK FLUSH 100 UNIT/ML IV SOLN
500.0000 [IU] | Freq: Once | INTRAVENOUS | Status: AC | PRN
  Administered 2019-10-28: 500 [IU]

## 2019-10-28 NOTE — Progress Notes (Signed)
[place], [place] [address]   CLINIC:  Medical Oncology/Hematology  PCP:  [name], MD [address] [place] [address] [phone]   REASON FOR VISIT: Metastatic squamous cell lung cancer.   INTERVAL HISTORY:  Lindsey Mullins 73 y.o. female seen for follow-up of metastatic lung cancer and toxicity assessment prior to immunotherapy.  She had CT scan of the chest, abdomen and pelvis done on 10/26/2019.  Reports 100% appetite.  She had a very good Thanksgiving.  Her daughter from [place] visited her.  Pain in the back is rated as 4 out of 10.  Pain is predominantly when moving.  Constipation is managed.  REVIEW OF SYSTEMS:  Review of Systems  Gastrointestinal: Positive for constipation.  Musculoskeletal: Positive for back pain.  All other systems reviewed and are negative.    PAST MEDICAL/SURGICAL HISTORY:  Past Medical History:  Diagnosis Date  . Atypical chest pain 05/23/2012   STRESS TEST - small to moderate sized area of partial reversibility of the anteroapical wall, most likely breast artifact, post-stress EF 67%, EKG show NSR at 65, No Lexiscan EKG changes, non-diagnostic for ischemia; STRESS TEST, 01/25/2010 - normal study, post-stress EF 66%, no significant ischemia  . Cancer ([place])   . Cerebral atherosclerosis 09/29/2012   CAROTID DUPLEX - RIGHT  BULB/PROXIMAL ICA-moderate amount of fibrous plaque, 50-69% diameter reduction; LEFT CEA-normal, no significant diameter reduction  . Colitis   . GERD (gastroesophageal reflux disease)   . Hyperlipidemia   . Hypertension   . Osteopenia   . Restless leg syndrome   . Shortness of breath 03/30/2005   2D ECHO - EF >55%, normal  . TIA (transient ischemic attack) 01/27/2010   2D ECHO - EF 65%, normal   Past Surgical History:  Procedure Laterality Date  . ABDOMINAL EXPLORATION SURGERY    . CAROTID ENDARTERECTOMY    . LYMPH NODE BIOPSY N/A 05/20/2019   Procedure: LYMPH NODE  BIOPSY CERVICAL;  Surgeon: Aviva Signs, MD;  Location: [facility];  Service: General;  Laterality: N/A;  . Peripheral Vascular Catheterization  07/16/2005   Right verterbral-50% smooth segmental badsilar artery stenosis on right side, Right carotid-50% ulcerated proxmial stenosis, Left carotid-60 to 70% left externam carotid artery stenosis, 90% proximal left ICA stenosis  . PORTACATH PLACEMENT Left 06/12/2019   Procedure: INSERTION PORT-A-CATH (catheter attached left subclavian);  Surgeon: Aviva Signs, MD;  Location: [facility];  Service: General;  Laterality: Left;     SOCIAL HISTORY:  Social History   Socioeconomic History  . Marital status: Divorced    Spouse name: Not on file  . Number of children: 2  . Years of education: Not on file  . Highest education level: Not on file  Occupational History  . Not on file  Tobacco Use  . Smoking status: Former Smoker    Packs/day: 0.25    Years: 10.00    Pack years: 2.50    Types: Cigarettes    Quit date: 04/21/1983    Years since quitting: 36.5  . Smokeless tobacco: Never Used  Substance and Sexual Activity  . Alcohol use: No  . Drug use: No  . Sexual activity: Not Currently  Other Topics Concern  . Not on file  Social History Narrative  . Not on file   Social Determinants of Health   Financial Resource Strain: Low Risk   . Difficulty of Paying Living Expenses: Not hard at all  Food Insecurity:  No Food Insecurity  . Worried About Charity fundraiser in the Last Year: Never true  . Ran Out of Food in the Last Year: Never true  Transportation Needs: No Transportation Needs  . Lack of Transportation (Medical): No  . Lack of Transportation (Non-Medical): No  Physical Activity: Inactive  . Days of Exercise per Week: 0 days  . Minutes of Exercise per Session: 0 min  Stress: No Stress Concern Present  . Feeling of Stress : Not at all  Social Connections: Somewhat Isolated  . Frequency of Communication with Friends and Family: Twice a  week  . Frequency of Social Gatherings with Friends and Family: Twice a week  . Attends Religious Services: More than 4 times per year  . Active Member of Clubs or Organizations: No  . Attends Archivist Meetings: Never  . Marital Status: Divorced  Human resources officer Violence: Not At Risk  . Fear of Current or Ex-Partner: No  . Emotionally Abused: No  . Physically Abused: No  . Sexually Abused: No    FAMILY HISTORY:  Family History  Problem Relation Age of Onset  . Heart disease Mother   . Hypertension Mother   . Hypertension Father   . Heart disease Father     CURRENT MEDICATIONS:  Outpatient Encounter Medications as of 10/28/2019  Medication Sig  . aspirin 81 MG tablet Take 81 mg by mouth at bedtime.   . gabapentin (NEURONTIN) 100 MG capsule Take 2 capsules (200 mg total) by mouth at bedtime.  [name] loratadine (CLARITIN) 10 MG tablet Take 10 mg by mouth daily as needed for allergies or itching.   . losartan (COZAAR) 50 MG tablet Take 1/2 (one-half) tablet by mouth once daily (Patient taking differently: Take 25 mg by mouth daily. )  . Omega-3 Krill Oil 500 MG CAPS Take 500 mg by mouth daily.   [name] omeprazole (PRILOSEC) 20 MG capsule Take 1 po daily (Patient taking differently: Take 20 mg by mouth daily. )  . polyethylene glycol powder (GLYCOLAX/MIRALAX) 17 GM/SCOOP powder Take 17 g by mouth 2 (two) times daily. Until daily soft stools  OTC  . rosuvastatin (CRESTOR) 20 MG tablet Take 20 mg by mouth at bedtime.  . senna (SENOKOT) 8.6 MG TABS tablet Take 2 tablets by mouth daily.  . simvastatin (ZOCOR) 20 MG tablet Take 1 tablet (20 mg total) by mouth at bedtime.  . naproxen sodium (ALEVE) 220 MG tablet Take 220 mg by mouth 2 (two) times daily as needed (pain).   . nitroGLYCERIN (NITROSTAT) 0.4 MG SL tablet DISSOLVE ONE TABLET UNDER THE TONGUE AS NEEDED FOR CHEST PAIN**MAX 3 TABLETS EACH 5 MINUTES APART** (Patient not taking: Reported on 09/11/2019)   Facility-Administered  Encounter Medications as of 10/28/2019  Medication  . sodium chloride flush (NS) 0.9 % injection 10 mL    ALLERGIES:  Allergies  Allergen Reactions  . Penicillins Shortness Of Breath and Other (See Comments)    Has patient had a PCN reaction causing immediate rash, facial/tongue/throat swelling, SOB or lightheadedness with hypotension: Yes Has patient had a PCN reaction causing severe rash involving mucus membranes or skin necrosis: Unk Has patient had a PCN reaction that required hospitalization: Unk Has patient had a PCN reaction occurring within the last 10 years: Yes "States that she was in ICU after being administered"   . Codeine Nausea And Vomiting  . Lipitor [Atorvastatin] Other (See Comments)    Myalgias  . Lisinopril Cough  . Pravastatin Other (See  Comments)    Myalgias     PHYSICAL EXAM:  ECOG Performance status: 1  Vitals:   10/28/19 0908  BP: 136/89  Pulse: (!) 121  Resp: 18  Temp: (!) 96.9 F (36.1 C)  SpO2: 100%   Filed Weights   10/28/19 0908  Weight: 170 lb 4.8 oz (77.2 kg)    Physical Exam Constitutional:      Appearance: Normal appearance. She is normal weight.  Cardiovascular:     Rate and Rhythm: Normal rate and regular rhythm.     Heart sounds: Normal heart sounds.  Pulmonary:     Effort: Pulmonary effort is normal.     Breath sounds: Normal breath sounds.  Abdominal:     General: Bowel sounds are normal.     Palpations: Abdomen is soft.  Musculoskeletal: Normal range of motion.  Skin:    General: Skin is warm and dry.  Neurological:     Mental Status: She is alert and oriented to person, place, and time. Mental status is at baseline.  Psychiatric:        Mood and Affect: Mood normal.        Behavior: Behavior normal.        Thought Content: Thought content normal.        Judgment: Judgment normal.      LABORATORY DATA:  I have reviewed the labs as listed.  CBC    Component Value Date/Time   WBC 5.7 10/28/2019 0841   RBC  3.68 (L) 10/28/2019 0841   HGB 11.2 (L) 10/28/2019 0841   HCT 35.9 (L) 10/28/2019 0841   PLT 274 10/28/2019 0841   MCV 97.6 10/28/2019 0841   MCH 30.4 10/28/2019 0841   MCHC 31.2 10/28/2019 0841   RDW 13.6 10/28/2019 0841   LYMPHSABS 2.2 10/28/2019 0841   MONOABS 0.4 10/28/2019 0841   EOSABS 0.3 10/28/2019 0841   BASOSABS 0.0 10/28/2019 0841   CMP Latest Ref Rng & Units 10/28/2019 10/07/2019 09/16/2019  Glucose 70 - 99 mg/dL 93 110(H) 104(H)  BUN 8 - 23 mg/dL _0 Creatinine 0.44 - 1.00 mg/dL 0.91 0.73 0.67  Sodium 135 - 145 mmol/L 139 139 139  Potassium 3.5 - 5.1 mmol/L 3.4(L) 3.3(L) 3.3(L)  Chloride 98 - 111 mmol/L 102 105 105  CO2 22 - 32 mmol/L 23 21(L) 23  Calcium 8.9 - 10.3 mg/dL 9.2 9.1 9.3  Total Protein 6.5 - 8.1 g/dL 7.3 7.2 7.3  Total Bilirubin 0.3 - 1.2 mg/dL 0.5 0.5 0.5  Alkaline Phos 38 - 126 U/L 99 112 127(H)  AST 15 - 41 U/L _1 ALT 0 - 44 U/L _2 DIAGNOSTIC IMAGING:  I have independently reviewed the scans and discussed with the patient.   ASSESSMENT & PLAN:   Squamous cell carcinoma of lung, stage IV, right ([place]) 1.  Metastatic poorly differentiated squamous cell carcinoma of the lung: -PD-L1 TPS-10%, foundation 1 MS-stable, no other targetable mutations. -PET scan on 05/08/2019-hypermetabolic right hilar, mediastinal, right supraclavicular adenopathy with solitary small hypermetabolic nodule in the right lung measuring 1 cm.  Focal activity in the right posterior oropharynx with no CT correlate.  Widespread hypermetabolic skeletal metastasis and solitary splenic metastasis.  MRI of the brain negative. -Right supraclavicular lymph node biopsy on 05/20/2019 shows metastatic poorly differentiated squamous cell carcinoma. -4 cycles of carboplatin, paclitaxel and pembrolizumab from 06/15/2019 through 08/18/2019. -CT CAP on 08/14/2019 showed right lower lobe pulmonary nodule  similar in size.  Splenic lesions slightly regressed.  New pathological  compression fracture at L3.  Old pathological compression fracture at L2. -Maintenance pembrolizumab started on 09/16/2019. -She is tolerating pembrolizumab every 3 weeks very well.  Has not developed any immunotherapy related side effects. -We reviewed results of the CT CAP from 10/26/2019.  Irregular spiculated central right lower lobe nodule measures 1.1 x 1.0, previously 1.4 x 0.9 cm.  Subtle hypoattenuating lesion in the right liver is less prominent today measuring 5 mm.  This was 10 mm previously.  A second hypoattenuating lesion seen in the posterior right liver is not seen on the current exam.  No new or progressive findings. -I have recommended continuation of pembrolizumab at this time.  We will see her back in 3 weeks for follow-up.  2.  Taxane-induced leg pains: -Pains have improved since we stopped chemotherapy last 3 times.  3.  Constipation: -She will continue MiraLAX and stool softener.  4.  Low back pain: -She continues to have some pain, rated as 4 out of 10 when moving.  She has injections scheduled.  5.  Elevated creatinine: -Her creatinine has slightly increased to 0.91, was 0.73 and 0.67 previously. -We will keep a close eye on it as immunotherapy can cause elevated creatinine.    Orders placed this encounter:  Orders Placed This Encounter  Procedures  . CBC with Differential/Platelet  . Comprehensive metabolic panel  . Magnesium  . CBC with Differential/Platelet  . Comprehensive metabolic panel  . Magnesium      Derek Jack, MD [place] [phone]

## 2019-10-28 NOTE — Patient Instructions (Signed)
[facility] Discharge Instructions for Patients Receiving Chemotherapy  Today you received the following chemotherapy agents   To help prevent nausea and vomiting after your treatment, we encourage you to take your nausea medication   If you develop nausea and vomiting that is not controlled by your nausea medication, call the clinic.   BELOW ARE SYMPTOMS THAT SHOULD BE REPORTED IMMEDIATELY:  *FEVER GREATER THAN 100.5 F  *CHILLS WITH OR WITHOUT FEVER  NAUSEA AND VOMITING THAT IS NOT CONTROLLED WITH YOUR NAUSEA MEDICATION  *UNUSUAL SHORTNESS OF BREATH  *UNUSUAL BRUISING OR BLEEDING  TENDERNESS IN MOUTH AND THROAT WITH OR WITHOUT PRESENCE OF ULCERS  *URINARY PROBLEMS  *BOWEL PROBLEMS  UNUSUAL RASH Items with * indicate a potential emergency and should be followed up as soon as possible.  Feel free to call the clinic should you have any questions or concerns. The clinic phone number is [phone].  Please show the CHEMO ALERT CARD at check-in to the Emergency Department and triage nurse.   

## 2019-10-28 NOTE — Progress Notes (Signed)
Labs reviewed at office visit today. Proceed with treatment per MD.   Treatment given per orders. Patient tolerated it well without problems. Vitals stable and discharged home from clinic via wheelchair. Follow up as scheduled.

## 2019-10-28 NOTE — Patient Instructions (Addendum)
[place] at [facility] Discharge Instructions  You were seen today by Dr. Delton Coombes. He went over your recent lab results. Continue your treatment every 3 weeks. He will see you back in 6 weeks for labs and follow up.   Thank you for choosing [place] at [facility] to provide your oncology and hematology care.  To afford each patient quality time with our provider, please arrive at least 15 minutes before your scheduled appointment time.   If you have a lab appointment with the [place] please come in thru the  Main Entrance and check in at the main information desk  You need to re-schedule your appointment should you arrive 10 or more minutes late.  We strive to give you quality time with our providers, and arriving late affects you and other patients whose appointments are after yours.  Also, if you no show three or more times for appointments you may be dismissed from the clinic at the providers discretion.     Again, thank you for choosing [facility].  Our hope is that these requests will decrease the amount of time that you wait before being seen by our physicians.       _____________________________________________________________  Should you have questions after your visit to [facility], please contact our office at [phone] between the hours of 8:00 a.m. and 4:30 p.m.  Voicemails left after 4:00 p.m. will not be returned until the following business day.  For prescription refill requests, have your pharmacy contact our office and allow 72 hours.    Cancer Center Support Programs:   > Cancer Support Group  2nd Tuesday of the month 1pm-2pm, Journey Room

## 2019-10-31 ENCOUNTER — Encounter (HOSPITAL_COMMUNITY): Payer: Self-pay | Admitting: Hematology

## 2019-10-31 NOTE — Assessment & Plan Note (Addendum)
1.  Metastatic poorly differentiated squamous cell carcinoma of the lung: -PD-L1 TPS-10%, foundation 1 MS-stable, no other targetable mutations. -PET scan on 05/08/2019-hypermetabolic right hilar, mediastinal, right supraclavicular adenopathy with solitary small hypermetabolic nodule in the right lung measuring 1 cm.  Focal activity in the right posterior oropharynx with no CT correlate.  Widespread hypermetabolic skeletal metastasis and solitary splenic metastasis.  MRI of the brain negative. -Right supraclavicular lymph node biopsy on 05/20/2019 shows metastatic poorly differentiated squamous cell carcinoma. -4 cycles of carboplatin, paclitaxel and pembrolizumab from 06/15/2019 through 08/18/2019. -CT CAP on 08/14/2019 showed right lower lobe pulmonary nodule similar in size.  Splenic lesions slightly regressed.  New pathological compression fracture at L3.  Old pathological compression fracture at L2. -Maintenance pembrolizumab started on 09/16/2019. -She is tolerating pembrolizumab every 3 weeks very well.  Has not developed any immunotherapy related side effects. -We reviewed results of the CT CAP from 10/26/2019.  Irregular spiculated central right lower lobe nodule measures 1.1 x 1.0, previously 1.4 x 0.9 cm.  Subtle hypoattenuating lesion in the right liver is less prominent today measuring 5 mm.  This was 10 mm previously.  A second hypoattenuating lesion seen in the posterior right liver is not seen on the current exam.  No new or progressive findings. -I have recommended continuation of pembrolizumab at this time.  We will see her back in 3 weeks for follow-up.  2.  Taxane-induced leg pains: -Pains have improved since we stopped chemotherapy last 3 times.  3.  Constipation: -She will continue MiraLAX and stool softener.  4.  Low back pain: -She continues to have some pain, rated as 4 out of 10 when moving.  She has injections scheduled.  5.  Elevated creatinine: -Her creatinine has slightly  increased to 0.91, was 0.73 and 0.67 previously. -We will keep a close eye on it as immunotherapy can cause elevated creatinine.

## 2019-11-09 ENCOUNTER — Ambulatory Visit: Payer: Self-pay

## 2019-11-09 ENCOUNTER — Ambulatory Visit (INDEPENDENT_AMBULATORY_CARE_PROVIDER_SITE_OTHER): Payer: Medicare Other | Admitting: Physical Medicine and Rehabilitation

## 2019-11-09 ENCOUNTER — Encounter: Payer: Self-pay | Admitting: Physical Medicine and Rehabilitation

## 2019-11-09 ENCOUNTER — Other Ambulatory Visit: Payer: Self-pay

## 2019-11-09 VITALS — BP 122/76 | HR 76

## 2019-11-09 DIAGNOSIS — M47816 Spondylosis without myelopathy or radiculopathy, lumbar region: Secondary | ICD-10-CM | POA: Diagnosis not present

## 2019-11-09 MED ORDER — METHYLPREDNISOLONE ACETATE 80 MG/ML IJ SUSP
80.0000 mg | Freq: Once | INTRAMUSCULAR | Status: AC
  Administered 2019-11-09: 80 mg

## 2019-11-09 NOTE — Progress Notes (Signed)
.  Numeric Pain Rating Scale and Functional Assessment Average Pain 7   In the last MONTH (on 0-10 scale) has pain interfered with the following?  1. General activity like being  able to carry out your everyday physical activities such as walking, climbing stairs, carrying groceries, or moving a chair?  Rating(8)   +Driver, -BT, -Dye Allergies.  

## 2019-11-09 NOTE — Progress Notes (Signed)
Lindsey Mullins - 73 y.o. female MRN [id number]  Date of birth: 03/11/1946  Office Visit Note: Visit Date: 11/09/2019 PCP: [name], MD Referred by: [name], MD  Subjective: Chief Complaint  Patient presents with  . Lower Back - Pain  . Right Leg - Pain  . Left Leg - Pain   HPI: Lindsey Mullins is a 73 y.o. female who comes in today For repeat bilateral L4-5 intra-articular facet joint block.  She reports 80% relief or more for many months after last facet joint block.  This injection was June 30, 2019.  Interestingly she reports mostly low back pain worse with standing and going from sit to stand very consistent with facet mediated pain.  MRI is consistent with facet arthropathy without stenosis or nerve compression.  She does feel some paresthesia in the legs.  Her case is complicated by history of metastatic squamous cell carcinoma of the lung.  She is undergoing treatment for that.  Epidural injection was not very beneficial but facet joint injection was greatly beneficial.  She has had no new issues since last seen her no trauma or red flag complaints.  ROS Otherwise per HPI.  Assessment & Plan: Visit Diagnoses:  1. Spondylosis without myelopathy or radiculopathy, lumbar region     Plan: No additional findings.   Meds & Orders:  Meds ordered this encounter  Medications  . methylPREDNISolone acetate (DEPO-MEDROL) injection 80 mg    Orders Placed This Encounter  Procedures  . Facet Injection  . XR C-ARM NO REPORT    Follow-up: Return for visit to requesting physician as needed.   Procedures: No procedures performed  Lumbar Facet Joint Intra-Articular Injection(s) with Fluoroscopic Guidance  Patient: Lindsey Mullins      Date of Birth: 02/20/1946 MRN: [id number] PCP: [name], MD      Visit Date: 11/09/2019   Universal Protocol:    Date/Time: 11/09/2019  Consent Given By: the patient  Position: PRONE   Additional Comments: Vital  signs were monitored before and after the procedure. Patient was prepped and draped in the usual sterile fashion. The correct patient, procedure, and site was verified.   Injection Procedure Details:  Procedure Site One Meds Administered:  Meds ordered this encounter  Medications  . methylPREDNISolone acetate (DEPO-MEDROL) injection 80 mg     Laterality: Bilateral  Location/Site:  L4-L5  Needle size: 22 guage  Needle type: Spinal  Needle Placement: Articular  Findings:  -Comments: Excellent flow of contrast producing a partial arthrogram.  Procedure Details: The fluoroscope beam is vertically oriented in AP, and the inferior recess is visualized beneath the lower pole of the inferior apophyseal process, which represents the target point for needle insertion. When direct visualization is difficult the target point is located at the medial projection of the vertebral pedicle. The region overlying each aforementioned target is locally anesthetized with a 1 to 2 ml. volume of 1% Lidocaine without Epinephrine.   The spinal needle was inserted into each of the above mentioned facet joints using biplanar fluoroscopic guidance. A 0.25 to 0.5 ml. volume of Isovue-250 was injected and a partial facet joint arthrogram was obtained. A single spot film was obtained of the resulting arthrogram.    One to 1.25 ml of the steroid/anesthetic solution was then injected into each of the facet joints noted above.   Additional Comments:  The patient tolerated the procedure well Dressing: 2 x 2 sterile gauze and Band-Aid    Post-procedure  details: Patient was observed during the procedure. Post-procedure instructions were reviewed.  Patient left the clinic in stable condition.     Clinical History: MRI LUMBAR SPINE WITHOUT CONTRAST  TECHNIQUE: Multiplanar, multisequence MR imaging of the lumbar spine was performed. No intravenous contrast was administered.  COMPARISON:  CT lumbar  spine 03/30/2019 and CT abdomen/pelvis 12/22/2018  FINDINGS: Segmentation: There are five lumbar type vertebral bodies. The last full intervertebral disc space is labeled L5-S1. This correlates with the recent CT scan.  Alignment:  Mild degenerative anterolisthesis of L4.  Vertebrae: Multiple bone lesions are demonstrated involving the L1, L2, L3 and L4 vertebral bodies. There is a pathologic fracture of L2 with minimal compression. Smaller lesions are suspected and L5 and S1. A right iliac bone lesion is also noted. Even in retrospect these are not readily identified on the recent CT scan. Possibilities would include metastatic disease and multiple myeloma. Looking back at the prior abdominal and pelvic CT scan from February I do not see any obvious primary lesion.  Conus medullaris and cauda equina: Conus extends to the T12-L1 level. Conus and cauda equina appear normal.  Paraspinal and other soft tissues: No paraspinal tumor or adenopathy is identified.  Disc levels:  L1-2: No disc protrusions, spinal or foraminal stenosis. The canal is quite generous. Incidental lipoma of the filum terminalis.  L2-3: No significant findings.  L3-4: No significant findings.  L4-5: Degenerative anterolisthesis of L4 with a bulging uncovered disc with mild flattening of the ventral thecal sac and mild bilateral lateral recess encroachment. There is moderate to advanced facet disease. Mild left-sided foraminal narrowing is also noted.  L5-S1: Moderate facet disease but no disc protrusions, spinal or foraminal stenosis.  IMPRESSION: 1. Numerous bone lesions involving the lumbar spine and right iliac bone worrisome for metastatic disease or multiple myeloma. No obvious primary lesion is seen on the recent CT scan from February 2020. Patient needs a metastatic workup. Recommend oncology consultation. 2. Pathologic fracture of L2 with minimal compression. 3. Degenerative  anterolisthesis of L4 with a bulging uncovered disc at L4-5. This in combination with advanced facet disease contributes to mild bilateral lateral recess encroachment and mild left foraminal narrowing.  These results will be called to the ordering clinician or representative by the Radiologist Assistant, and communication documented in the PACS or zVision Dashboard.   Electronically Signed   By: Marijo Sanes M.D.   On: 04/15/2019 18:01   She reports that she quit smoking about 36 years ago. Her smoking use included cigarettes. She has a 2.50 pack-year smoking history. She has never used smokeless tobacco. No results for input(s): HGBA1C, LABURIC in the last 8760 hours.  Objective:  VS:  HT:    WT:   BMI:     BP:122/76  HR:76bpm  TEMP: ( )  RESP:  Physical Exam  Ortho Exam Imaging: No results found.  Past Medical/Family/Surgical/Social History: Medications & Allergies reviewed per EMR, new medications updated. Patient Active Problem List   Diagnosis Date Noted  . Peripheral edema 09/11/2019  . Malignant neoplasm of right lung ([place])   . Goals of care, counseling/discussion 06/10/2019  . Squamous cell carcinoma of lung, stage IV, right ([place]) 05/28/2019  . Metastatic cancer ([place]) 05/26/2019  . Cervical lymphadenopathy   . Compression fracture of L2 ([place]) 04/29/2019  . Arteriosclerosis, mesenteric artery ([place]) 12/31/2018  . Abdominal pain 12/22/2018  . Colitis 12/22/2018  . Osteopenia   . DDD (degenerative disc disease), lumbar 05/11/2016  . Obesity 05/11/2016  .  Glucose intolerance (impaired glucose tolerance) 01/12/2016  . Pain in the chest   . Chest pain 01/10/2016  . GERD (gastroesophageal reflux disease) 01/26/2015  . Stress and adjustment reaction 01/26/2015  . Dysuria 11/30/2013  . Carotid artery disease ([place]) 04/20/2013  . Bunion, left 09/21/2012  . Overweight(278.02) 06/04/2012  . Essential hypertension, benign 04/02/2012  . Hyperlipidemia 04/02/2012  .  Dysphagia 04/02/2012  . RLS (restless legs syndrome) 04/02/2012   Past Medical History:  Diagnosis Date  . Atypical chest pain 05/23/2012   STRESS TEST - small to moderate sized area of partial reversibility of the anteroapical wall, most likely breast artifact, post-stress EF 67%, EKG show NSR at 65, No Lexiscan EKG changes, non-diagnostic for ischemia; STRESS TEST, 01/25/2010 - normal study, post-stress EF 66%, no significant ischemia  . Cancer ([place])   . Cerebral atherosclerosis 09/29/2012   CAROTID DUPLEX - RIGHT  BULB/PROXIMAL ICA-moderate amount of fibrous plaque, 50-69% diameter reduction; LEFT CEA-normal, no significant diameter reduction  . Colitis   . GERD (gastroesophageal reflux disease)   . Hyperlipidemia   . Hypertension   . Osteopenia   . Restless leg syndrome   . Shortness of breath 03/30/2005   2D ECHO - EF >55%, normal  . TIA (transient ischemic attack) 01/27/2010   2D ECHO - EF 65%, normal   Family History  Problem Relation Age of Onset  . Heart disease Mother   . Hypertension Mother   . Hypertension Father   . Heart disease Father    Past Surgical History:  Procedure Laterality Date  . ABDOMINAL EXPLORATION SURGERY    . CAROTID ENDARTERECTOMY    . LYMPH NODE BIOPSY N/A 05/20/2019   Procedure: LYMPH NODE BIOPSY CERVICAL;  Surgeon: Aviva Signs, MD;  Location: [facility];  Service: General;  Laterality: N/A;  . Peripheral Vascular Catheterization  07/16/2005   Right verterbral-50% smooth segmental badsilar artery stenosis on right side, Right carotid-50% ulcerated proxmial stenosis, Left carotid-60 to 70% left externam carotid artery stenosis, 90% proximal left ICA stenosis  . PORTACATH PLACEMENT Left 06/12/2019   Procedure: INSERTION PORT-A-CATH (catheter attached left subclavian);  Surgeon: Aviva Signs, MD;  Location: [facility];  Service: General;  Laterality: Left;   Social History   Occupational History  . Not on file  Tobacco Use  . Smoking status: Former Smoker     Packs/day: 0.25    Years: 10.00    Pack years: 2.50    Types: Cigarettes    Quit date: 04/21/1983    Years since quitting: 36.5  . Smokeless tobacco: Never Used  Substance and Sexual Activity  . Alcohol use: No  . Drug use: No  . Sexual activity: Not Currently

## 2019-11-09 NOTE — Procedures (Signed)
Lumbar Facet Joint Intra-Articular Injection(s) with Fluoroscopic Guidance  Patient: Lindsey Mullins      Date of Birth: [birth year] MRN: [id number] PCP: [name], MD      Visit Date: 11/09/2019   Universal Protocol:    Date/Time: 11/09/2019  Consent Given By: the patient  Position: PRONE   Additional Comments: Vital signs were monitored before and after the procedure. Patient was prepped and draped in the usual sterile fashion. The correct patient, procedure, and site was verified.   Injection Procedure Details:  Procedure Site One Meds Administered:  Meds ordered this encounter  Medications  . methylPREDNISolone acetate (DEPO-MEDROL) injection 80 mg     Laterality: Bilateral  Location/Site:  L4-L5  Needle size: 22 guage  Needle type: Spinal  Needle Placement: Articular  Findings:  -Comments: Excellent flow of contrast producing a partial arthrogram.  Procedure Details: The fluoroscope beam is vertically oriented in AP, and the inferior recess is visualized beneath the lower pole of the inferior apophyseal process, which represents the target point for needle insertion. When direct visualization is difficult the target point is located at the medial projection of the vertebral pedicle. The region overlying each aforementioned target is locally anesthetized with a 1 to 2 ml. volume of 1% Lidocaine without Epinephrine.   The spinal needle was inserted into each of the above mentioned facet joints using biplanar fluoroscopic guidance. A 0.25 to 0.5 ml. volume of Isovue-250 was injected and a partial facet joint arthrogram was obtained. A single spot film was obtained of the resulting arthrogram.    One to 1.25 ml of the steroid/anesthetic solution was then injected into each of the facet joints noted above.   Additional Comments:  The patient tolerated the procedure well Dressing: 2 x 2 sterile gauze and Band-Aid    Post-procedure details: Patient was  observed during the procedure. Post-procedure instructions were reviewed.  Patient left the clinic in stable condition.

## 2019-11-18 ENCOUNTER — Inpatient Hospital Stay (HOSPITAL_COMMUNITY): Payer: Medicare Other

## 2019-11-18 ENCOUNTER — Other Ambulatory Visit: Payer: Self-pay

## 2019-11-18 ENCOUNTER — Encounter (HOSPITAL_COMMUNITY): Payer: Self-pay

## 2019-11-18 VITALS — BP 116/62 | HR 71 | Temp 98.1°F | Resp 18 | Wt 176.0 lb

## 2019-11-18 DIAGNOSIS — C3491 Malignant neoplasm of unspecified part of right bronchus or lung: Secondary | ICD-10-CM | POA: Diagnosis not present

## 2019-11-18 DIAGNOSIS — C7800 Secondary malignant neoplasm of unspecified lung: Secondary | ICD-10-CM

## 2019-11-18 LAB — MAGNESIUM: Magnesium: 2.1 mg/dL (ref 1.7–2.4)

## 2019-11-18 LAB — CBC WITH DIFFERENTIAL/PLATELET
Abs Immature Granulocytes: 0.02 10*3/uL (ref 0.00–0.07)
Basophils Absolute: 0.1 10*3/uL (ref 0.0–0.1)
Basophils Relative: 1 %
Eosinophils Absolute: 0.2 10*3/uL (ref 0.0–0.5)
Eosinophils Relative: 3 %
HCT: 36.3 % (ref 36.0–46.0)
Hemoglobin: 11.2 g/dL — ABNORMAL LOW (ref 12.0–15.0)
Immature Granulocytes: 0 %
Lymphocytes Relative: 36 %
Lymphs Abs: 2.5 10*3/uL (ref 0.7–4.0)
MCH: 30.1 pg (ref 26.0–34.0)
MCHC: 30.9 g/dL (ref 30.0–36.0)
MCV: 97.6 fL (ref 80.0–100.0)
Monocytes Absolute: 0.6 10*3/uL (ref 0.1–1.0)
Monocytes Relative: 8 %
Neutro Abs: 3.7 10*3/uL (ref 1.7–7.7)
Neutrophils Relative %: 52 %
Platelets: 271 10*3/uL (ref 150–400)
RBC: 3.72 MIL/uL — ABNORMAL LOW (ref 3.87–5.11)
RDW: 13.8 % (ref 11.5–15.5)
WBC: 7.1 10*3/uL (ref 4.0–10.5)
nRBC: 0 % (ref 0.0–0.2)

## 2019-11-18 LAB — COMPREHENSIVE METABOLIC PANEL
ALT: 19 U/L (ref 0–44)
AST: 31 U/L (ref 15–41)
Albumin: 3.8 g/dL (ref 3.5–5.0)
Alkaline Phosphatase: 86 U/L (ref 38–126)
Anion gap: 11 (ref 5–15)
BUN: 16 mg/dL (ref 8–23)
CO2: 25 mmol/L (ref 22–32)
Calcium: 9.1 mg/dL (ref 8.9–10.3)
Chloride: 102 mmol/L (ref 98–111)
Creatinine, Ser: 0.98 mg/dL (ref 0.44–1.00)
GFR calc Af Amer: >60 mL/min (ref >60)
GFR calc non Af Amer: 58 mL/min — ABNORMAL LOW (ref >60)
Glucose, Bld: 69 mg/dL — ABNORMAL LOW (ref 70–99)
Potassium: 3.6 mmol/L (ref 3.5–5.1)
Sodium: 138 mmol/L (ref 135–145)
Total Bilirubin: 0.5 mg/dL (ref 0.3–1.2)
Total Protein: 7.3 g/dL (ref 6.5–8.1)

## 2019-11-18 MED ORDER — SODIUM CHLORIDE 0.9 % IV SOLN: Freq: Once | INTRAVENOUS | Status: AC

## 2019-11-18 MED ORDER — SODIUM CHLORIDE 0.9% FLUSH: 10.0000 mL | INTRAVENOUS | Status: DC | PRN

## 2019-11-18 MED ORDER — SODIUM CHLORIDE 0.9 % IV SOLN
200.0000 mg | Freq: Once | INTRAVENOUS | Status: AC
  Administered 2019-11-18: 11:00:00 200 mg via INTRAVENOUS
  Filled 2019-11-18: qty 8

## 2019-11-18 MED ORDER — HEPARIN SOD (PORK) LOCK FLUSH 100 UNIT/ML IV SOLN
500.0000 [IU] | Freq: Once | INTRAVENOUS | Status: AC | PRN
  Administered 2019-11-18: 500 [IU]

## 2019-11-18 NOTE — Progress Notes (Signed)
Patient presents today for treatment. Vital signs stable and within parameters for treatment. MAR reviewed and updated. Patient denies any changes since her last visit. Patient has pain today in her back that she rates a 6/10. Patient states it's chronic pain and she gets injections for the pain in [place]. Labs pending.   Treatment given today per MD orders. Tolerated infusion without adverse affects. Vital signs stable. No complaints at this time. Discharged from clinic via wheel chair.  F/U with [facility] as scheduled.

## 2019-11-18 NOTE — Patient Instructions (Signed)
[facility] Discharge Instructions for Patients Receiving Chemotherapy  Today you received the following chemotherapy agents   To help prevent nausea and vomiting after your treatment, we encourage you to take your nausea medication   If you develop nausea and vomiting that is not controlled by your nausea medication, call the clinic.   BELOW ARE SYMPTOMS THAT SHOULD BE REPORTED IMMEDIATELY:  *FEVER GREATER THAN 100.5 F  *CHILLS WITH OR WITHOUT FEVER  NAUSEA AND VOMITING THAT IS NOT CONTROLLED WITH YOUR NAUSEA MEDICATION  *UNUSUAL SHORTNESS OF BREATH  *UNUSUAL BRUISING OR BLEEDING  TENDERNESS IN MOUTH AND THROAT WITH OR WITHOUT PRESENCE OF ULCERS  *URINARY PROBLEMS  *BOWEL PROBLEMS  UNUSUAL RASH Items with * indicate a potential emergency and should be followed up as soon as possible.  Feel free to call the clinic should you have any questions or concerns. The clinic phone number is [phone].  Please show the CHEMO ALERT CARD at check-in to the Emergency Department and triage nurse.   

## 2019-12-07 ENCOUNTER — Telehealth: Payer: Self-pay | Admitting: Physical Medicine and Rehabilitation

## 2019-12-07 NOTE — Telephone Encounter (Signed)
MRI from 03/2019 show likely signs of metastatic disease in spine, I would suggest she talk to oncologist and see if interventional radiologist could look at her - some can do therapeutic x-ray treatment. I do not think shots are helping at this point.

## 2019-12-07 NOTE — Telephone Encounter (Signed)
Called patient's daughter to advise.

## 2019-12-09 ENCOUNTER — Inpatient Hospital Stay (HOSPITAL_COMMUNITY): Payer: Medicare PPO | Attending: Hematology

## 2019-12-09 ENCOUNTER — Inpatient Hospital Stay (HOSPITAL_COMMUNITY): Payer: Medicare PPO | Admitting: Hematology

## 2019-12-09 ENCOUNTER — Inpatient Hospital Stay (HOSPITAL_COMMUNITY): Payer: Medicare PPO

## 2019-12-09 ENCOUNTER — Other Ambulatory Visit: Payer: Self-pay

## 2019-12-09 ENCOUNTER — Encounter (HOSPITAL_COMMUNITY): Payer: Self-pay | Admitting: Hematology

## 2019-12-09 VITALS — BP 123/79 | HR 70 | Temp 97.1°F | Resp 18

## 2019-12-09 VITALS — BP 92/64 | HR 78 | Temp 97.1°F | Resp 12 | Wt 175.5 lb

## 2019-12-09 DIAGNOSIS — Z7982 Long term (current) use of aspirin: Secondary | ICD-10-CM | POA: Insufficient documentation

## 2019-12-09 DIAGNOSIS — E785 Hyperlipidemia, unspecified: Secondary | ICD-10-CM | POA: Diagnosis not present

## 2019-12-09 DIAGNOSIS — M79606 Pain in leg, unspecified: Secondary | ICD-10-CM | POA: Diagnosis not present

## 2019-12-09 DIAGNOSIS — Z87891 Personal history of nicotine dependence: Secondary | ICD-10-CM | POA: Insufficient documentation

## 2019-12-09 DIAGNOSIS — Z8673 Personal history of transient ischemic attack (TIA), and cerebral infarction without residual deficits: Secondary | ICD-10-CM | POA: Insufficient documentation

## 2019-12-09 DIAGNOSIS — C7800 Secondary malignant neoplasm of unspecified lung: Secondary | ICD-10-CM | POA: Diagnosis not present

## 2019-12-09 DIAGNOSIS — I10 Essential (primary) hypertension: Secondary | ICD-10-CM | POA: Diagnosis not present

## 2019-12-09 DIAGNOSIS — E876 Hypokalemia: Secondary | ICD-10-CM | POA: Insufficient documentation

## 2019-12-09 DIAGNOSIS — M8580 Other specified disorders of bone density and structure, unspecified site: Secondary | ICD-10-CM | POA: Insufficient documentation

## 2019-12-09 DIAGNOSIS — K219 Gastro-esophageal reflux disease without esophagitis: Secondary | ICD-10-CM | POA: Insufficient documentation

## 2019-12-09 DIAGNOSIS — K5900 Constipation, unspecified: Secondary | ICD-10-CM | POA: Insufficient documentation

## 2019-12-09 DIAGNOSIS — C3431 Malignant neoplasm of lower lobe, right bronchus or lung: Secondary | ICD-10-CM | POA: Diagnosis not present

## 2019-12-09 DIAGNOSIS — M545 Low back pain: Secondary | ICD-10-CM | POA: Diagnosis not present

## 2019-12-09 DIAGNOSIS — C3491 Malignant neoplasm of unspecified part of right bronchus or lung: Secondary | ICD-10-CM

## 2019-12-09 DIAGNOSIS — Z5112 Encounter for antineoplastic immunotherapy: Secondary | ICD-10-CM | POA: Diagnosis not present

## 2019-12-09 DIAGNOSIS — Z79899 Other long term (current) drug therapy: Secondary | ICD-10-CM | POA: Insufficient documentation

## 2019-12-09 DIAGNOSIS — T451X5A Adverse effect of antineoplastic and immunosuppressive drugs, initial encounter: Secondary | ICD-10-CM | POA: Diagnosis not present

## 2019-12-09 DIAGNOSIS — C7951 Secondary malignant neoplasm of bone: Secondary | ICD-10-CM | POA: Diagnosis not present

## 2019-12-09 DIAGNOSIS — R944 Abnormal results of kidney function studies: Secondary | ICD-10-CM | POA: Diagnosis not present

## 2019-12-09 LAB — COMPREHENSIVE METABOLIC PANEL
ALT: 34 U/L (ref 0–44)
AST: 59 U/L — ABNORMAL HIGH (ref 15–41)
Albumin: 4.2 g/dL (ref 3.5–5.0)
Alkaline Phosphatase: 105 U/L (ref 38–126)
Anion gap: 14 (ref 5–15)
BUN: 11 mg/dL (ref 8–23)
CO2: 27 mmol/L (ref 22–32)
Calcium: 9.2 mg/dL (ref 8.9–10.3)
Chloride: 98 mmol/L (ref 98–111)
Creatinine, Ser: 1.03 mg/dL — ABNORMAL HIGH (ref 0.44–1.00)
GFR calc Af Amer: >60 mL/min (ref >60)
GFR calc non Af Amer: 54 mL/min — ABNORMAL LOW (ref >60)
Glucose, Bld: 86 mg/dL (ref 70–99)
Potassium: 3.1 mmol/L — ABNORMAL LOW (ref 3.5–5.1)
Sodium: 139 mmol/L (ref 135–145)
Total Bilirubin: 1 mg/dL (ref 0.3–1.2)
Total Protein: 7.9 g/dL (ref 6.5–8.1)

## 2019-12-09 LAB — CBC WITH DIFFERENTIAL/PLATELET
Abs Immature Granulocytes: 0.01 10*3/uL (ref 0.00–0.07)
Basophils Absolute: 0 10*3/uL (ref 0.0–0.1)
Basophils Relative: 0 %
Eosinophils Absolute: 0.1 10*3/uL (ref 0.0–0.5)
Eosinophils Relative: 2 %
HCT: 37.6 % (ref 36.0–46.0)
Hemoglobin: 11.7 g/dL — ABNORMAL LOW (ref 12.0–15.0)
Immature Granulocytes: 0 %
Lymphocytes Relative: 39 %
Lymphs Abs: 2.3 10*3/uL (ref 0.7–4.0)
MCH: 30 pg (ref 26.0–34.0)
MCHC: 31.1 g/dL (ref 30.0–36.0)
MCV: 96.4 fL (ref 80.0–100.0)
Monocytes Absolute: 0.4 10*3/uL (ref 0.1–1.0)
Monocytes Relative: 7 %
Neutro Abs: 3 10*3/uL (ref 1.7–7.7)
Neutrophils Relative %: 52 %
Platelets: 191 10*3/uL (ref 150–400)
RBC: 3.9 MIL/uL (ref 3.87–5.11)
RDW: 14.2 % (ref 11.5–15.5)
WBC: 5.8 10*3/uL (ref 4.0–10.5)
nRBC: 0 % (ref 0.0–0.2)

## 2019-12-09 LAB — MAGNESIUM: Magnesium: 1.9 mg/dL (ref 1.7–2.4)

## 2019-12-09 MED ORDER — SODIUM CHLORIDE 0.9 % IV SOLN: Freq: Once | INTRAVENOUS | Status: AC

## 2019-12-09 MED ORDER — HEPARIN SOD (PORK) LOCK FLUSH 100 UNIT/ML IV SOLN
500.0000 [IU] | Freq: Once | INTRAVENOUS | Status: AC | PRN
  Administered 2019-12-09: 12:00:00 500 [IU]

## 2019-12-09 MED ORDER — POTASSIUM CHLORIDE CRYS ER 20 MEQ PO TBCR
40.0000 meq | EXTENDED_RELEASE_TABLET | Freq: Once | ORAL | Status: AC
  Administered 2019-12-09: 40 meq via ORAL
  Filled 2019-12-09: qty 2

## 2019-12-09 MED ORDER — SODIUM CHLORIDE 0.9% FLUSH
10.0000 mL | INTRAVENOUS | Status: DC | PRN
  Administered 2019-12-09: 10 mL

## 2019-12-09 MED ORDER — SODIUM CHLORIDE 0.9 % IV SOLN
200.0000 mg | Freq: Once | INTRAVENOUS | Status: AC
  Administered 2019-12-09: 12:00:00 200 mg via INTRAVENOUS
  Filled 2019-12-09: qty 8

## 2019-12-09 NOTE — Progress Notes (Signed)
To treatment room.  No s/s of distress noted.  Ok to treat verbal order Dr. Delton Coombes.    Patient tolerated therapy with no complaints voiced.  Port site clean and dry with no bruising or swelling noted at site.  Good blood return noted before and after administration of therapy.  Band aid applied.  Patient left by wheelchair with VSS and no s/s of distress noted.

## 2019-12-09 NOTE — Progress Notes (Signed)
Patient has been assessed, vital signs and labs have been reviewed by Dr. Delton Coombes. ANC,  LFTs, and Platelets are within treatment parameters, Creatinine 1.03 today ok per Dr. Delton Coombes. Please give 40 mEq of potassium po today. The patient is good to proceed with treatment at this time.

## 2019-12-09 NOTE — Patient Instructions (Addendum)
[place] at [facility] Discharge Instructions  You were seen today by Dr. Delton Coombes. He went over your recent lab results. He will schedule you for a MRI of your back prior to your next visit. He will see you back in 3 weeks for labs, treatment and follow up.   Thank you for choosing [place] at [facility] to provide your oncology and hematology care.  To afford each patient quality time with our provider, please arrive at least 15 minutes before your scheduled appointment time.   If you have a lab appointment with the [place] please come in thru the  Main Entrance and check in at the main information desk  You need to re-schedule your appointment should you arrive 10 or more minutes late.  We strive to give you quality time with our providers, and arriving late affects you and other patients whose appointments are after yours.  Also, if you no show three or more times for appointments you may be dismissed from the clinic at the providers discretion.     Again, thank you for choosing [facility].  Our hope is that these requests will decrease the amount of time that you wait before being seen by our physicians.       _____________________________________________________________  Should you have questions after your visit to [facility], please contact our office at [phone] between the hours of 8:00 a.m. and 4:30 p.m.  Voicemails left after 4:00 p.m. will not be returned until the following business day.  For prescription refill requests, have your pharmacy contact our office and allow 72 hours.    Cancer Center Support Programs:   > Cancer Support Group  2nd Tuesday of the month 1pm-2pm, Journey Room

## 2019-12-09 NOTE — Progress Notes (Signed)
[facility] [place], [place] [address]   CLINIC:  Medical Oncology/Hematology  PCP:  [name], MD [address] [place] [address] [phone]   REASON FOR VISIT: Metastatic squamous cell lung cancer.   INTERVAL HISTORY:  Ms. Gin 74 y.o. female seen for follow-up of metastatic lung cancer and toxicity assessment prior to her next cycle of immunotherapy.  She reported worsening of back pain, predominantly towards the left side of the lower back.  She reportedly had back injection 3 weeks ago but did not notice much difference.  She is taking Aleve twice daily.  Denies any diarrhea, skin rashes or new onset dry cough.  Reports baseline constipation is stable.  Numbness in the feet is also stable.  REVIEW OF SYSTEMS:  Review of Systems  Gastrointestinal: Positive for constipation.  Musculoskeletal: Positive for back pain.  Neurological: Positive for numbness.  All other systems reviewed and are negative.    PAST MEDICAL/SURGICAL HISTORY:  Past Medical History:  Diagnosis Date  . Atypical chest pain 05/23/2012   STRESS TEST - small to moderate sized area of partial reversibility of the anteroapical wall, most likely breast artifact, post-stress EF 67%, EKG show NSR at 65, No Lexiscan EKG changes, non-diagnostic for ischemia; STRESS TEST, 01/25/2010 - normal study, post-stress EF 66%, no significant ischemia  . Cancer ([place])   . Cerebral atherosclerosis 09/29/2012   CAROTID DUPLEX - RIGHT  BULB/PROXIMAL ICA-moderate amount of fibrous plaque, 50-69% diameter reduction; LEFT CEA-normal, no significant diameter reduction  . Colitis   . GERD (gastroesophageal reflux disease)   . Hyperlipidemia   . Hypertension   . Osteopenia   . Restless leg syndrome   . Shortness of breath 03/30/2005   2D ECHO - EF >55%, normal  . TIA (transient ischemic attack) 01/27/2010   2D ECHO - EF 65%, normal   Past Surgical History:  Procedure  Laterality Date  . ABDOMINAL EXPLORATION SURGERY    . CAROTID ENDARTERECTOMY    . LYMPH NODE BIOPSY N/A 05/20/2019   Procedure: LYMPH NODE BIOPSY CERVICAL;  Surgeon: Aviva Signs, MD;  Location: [facility];  Service: General;  Laterality: N/A;  . Peripheral Vascular Catheterization  07/16/2005   Right verterbral-50% smooth segmental badsilar artery stenosis on right side, Right carotid-50% ulcerated proxmial stenosis, Left carotid-60 to 70% left externam carotid artery stenosis, 90% proximal left ICA stenosis  . PORTACATH PLACEMENT Left 06/12/2019   Procedure: INSERTION PORT-A-CATH (catheter attached left subclavian);  Surgeon: Aviva Signs, MD;  Location: [facility];  Service: General;  Laterality: Left;     SOCIAL HISTORY:  Social History   Socioeconomic History  . Marital status: Divorced    Spouse name: Not on file  . Number of children: 2  . Years of education: Not on file  . Highest education level: Not on file  Occupational History  . Not on file  Tobacco Use  . Smoking status: Former Smoker    Packs/day: 0.25    Years: 10.00    Pack years: 2.50    Types: Cigarettes    Quit date: 04/21/1983    Years since quitting: 36.6  . Smokeless tobacco: Never Used  Substance and Sexual Activity  . Alcohol use: No  . Drug use: No  . Sexual activity: Not Currently  Other Topics Concern  . Not on file  Social History Narrative  . Not on file   Social Determinants of Health   Financial Resource  Strain: Low Risk   . Difficulty of Paying Living Expenses: Not hard at all  Food Insecurity: No Food Insecurity  . Worried About Charity fundraiser in the Last Year: Never true  . Ran Out of Food in the Last Year: Never true  Transportation Needs: No Transportation Needs  . Lack of Transportation (Medical): No  . Lack of Transportation (Non-Medical): No  Physical Activity: Inactive  . Days of Exercise per Week: 0 days  . Minutes of Exercise per Session: 0 min  Stress: No Stress Concern  Present  . Feeling of Stress : Not at all  Social Connections: Somewhat Isolated  . Frequency of Communication with Friends and Family: Twice a week  . Frequency of Social Gatherings with Friends and Family: Twice a week  . Attends Religious Services: More than 4 times per year  . Active Member of Clubs or Organizations: No  . Attends Archivist Meetings: Never  . Marital Status: Divorced  Human resources officer Violence: Not At Risk  . Fear of Current or Ex-Partner: No  . Emotionally Abused: No  . Physically Abused: No  . Sexually Abused: No    FAMILY HISTORY:  Family History  Problem Relation Age of Onset  . Heart disease Mother   . Hypertension Mother   . Hypertension Father   . Heart disease Father     CURRENT MEDICATIONS:  Outpatient Encounter Medications as of 12/09/2019  Medication Sig  . aspirin 81 MG tablet Take 81 mg by mouth at bedtime.   . gabapentin (NEURONTIN) 100 MG capsule Take 2 capsules (200 mg total) by mouth at bedtime.  [name] loratadine (CLARITIN) 10 MG tablet Take 10 mg by mouth daily as needed for allergies or itching.   . losartan (COZAAR) 50 MG tablet Take 1/2 (one-half) tablet by mouth once daily (Patient taking differently: Take 25 mg by mouth daily. )  . naproxen sodium (ALEVE) 220 MG tablet Take 220 mg by mouth 2 (two) times daily as needed (pain).   . nitroGLYCERIN (NITROSTAT) 0.4 MG SL tablet DISSOLVE ONE TABLET UNDER THE TONGUE AS NEEDED FOR CHEST PAIN**MAX 3 TABLETS EACH 5 MINUTES APART**  . Omega-3 Krill Oil 500 MG CAPS Take 500 mg by mouth daily.   [name] omeprazole (PRILOSEC) 20 MG capsule Take 1 po daily (Patient taking differently: Take 20 mg by mouth daily. )  . polyethylene glycol powder (GLYCOLAX/MIRALAX) 17 GM/SCOOP powder Take 17 g by mouth 2 (two) times daily. Until daily soft stools  OTC  . rosuvastatin (CRESTOR) 20 MG tablet Take 20 mg by mouth at bedtime.  . senna (SENOKOT) 8.6 MG TABS tablet Take 2 tablets by mouth daily.  .  simvastatin (ZOCOR) 20 MG tablet Take 1 tablet (20 mg total) by mouth at bedtime.   Facility-Administered Encounter Medications as of 12/09/2019  Medication  . sodium chloride flush (NS) 0.9 % injection 10 mL    ALLERGIES:  Allergies  Allergen Reactions  . Penicillins Shortness Of Breath and Other (See Comments)    Has patient had a PCN reaction causing immediate rash, facial/tongue/throat swelling, SOB or lightheadedness with hypotension: Yes Has patient had a PCN reaction causing severe rash involving mucus membranes or skin necrosis: Unk Has patient had a PCN reaction that required hospitalization: Unk Has patient had a PCN reaction occurring within the last 10 years: Yes "States that she was in ICU after being administered"   . Codeine Nausea And Vomiting  . Lipitor [Atorvastatin] Other (See Comments)  Myalgias  . Lisinopril Cough  . Pravastatin Other (See Comments)    Myalgias     PHYSICAL EXAM:  ECOG Performance status: 1  Vitals:   12/09/19 0946  BP: 92/64  Pulse: 78  Resp: 12  Temp: (!) 97.1 F (36.2 C)  SpO2: 100%   Filed Weights   12/09/19 0946  Weight: 175 lb 8 oz (79.6 kg)    Physical Exam Constitutional:      Appearance: Normal appearance. She is normal weight.  Cardiovascular:     Rate and Rhythm: Normal rate and regular rhythm.     Heart sounds: Normal heart sounds.  Pulmonary:     Effort: Pulmonary effort is normal.     Breath sounds: Normal breath sounds.  Abdominal:     General: Bowel sounds are normal.     Palpations: Abdomen is soft.  Musculoskeletal:        General: Normal range of motion.  Skin:    General: Skin is warm and dry.  Neurological:     Mental Status: She is alert and oriented to person, place, and time. Mental status is at baseline.  Psychiatric:        Mood and Affect: Mood normal.        Behavior: Behavior normal.        Thought Content: Thought content normal.        Judgment: Judgment normal.      LABORATORY  DATA:  I have reviewed the labs as listed.  CBC    Component Value Date/Time   WBC 5.8 12/09/2019 0923   RBC 3.90 12/09/2019 0923   HGB 11.7 (L) 12/09/2019 0923   HCT 37.6 12/09/2019 0923   PLT 191 12/09/2019 0923   MCV 96.4 12/09/2019 0923   MCH 30.0 12/09/2019 0923   MCHC 31.1 12/09/2019 0923   RDW 14.2 12/09/2019 0923   LYMPHSABS 2.3 12/09/2019 0923   MONOABS 0.4 12/09/2019 0923   EOSABS 0.1 12/09/2019 0923   BASOSABS 0.0 12/09/2019 0923   CMP Latest Ref Rng & Units 12/09/2019 11/18/2019 10/28/2019  Glucose 70 - 99 mg/dL 86 69(L) 93  BUN 8 - 23 mg/dL '11 16 10  ' Creatinine 0.44 - 1.00 mg/dL 1.03(H) 0.98 0.91  Sodium 135 - 145 mmol/L 139 138 139  Potassium 3.5 - 5.1 mmol/L 3.1(L) 3.6 3.4(L)  Chloride 98 - 111 mmol/L 98 102 102  CO2 22 - 32 mmol/L '27 25 23  ' Calcium 8.9 - 10.3 mg/dL 9.2 9.1 9.2  Total Protein 6.5 - 8.1 g/dL 7.9 7.3 7.3  Total Bilirubin 0.3 - 1.2 mg/dL 1.0 0.5 0.5  Alkaline Phos 38 - 126 U/L 105 86 99  AST 15 - 41 U/L 59(H) 31 31  ALT 0 - 44 U/L 34 19 14       DIAGNOSTIC IMAGING:  I have independently reviewed the scans and discussed with the patient.   ASSESSMENT & PLAN:   Squamous cell carcinoma of lung, stage IV, right ([place]) 1.  Metastatic poorly differentiated squamous cell carcinoma of the lung: -PD-L1 TPS-10%, foundation 1 MS-stable, no other targetable mutations. -PET scan on 05/08/2019-hypermetabolic right hilar, mediastinal, right supraclavicular adenopathy with solitary small hypermetabolic nodule in the right lung measuring 1 cm.  Focal activity in the right posterior oropharynx with no CT correlate.  Widespread hypermetabolic skeletal metastasis and solitary splenic metastasis.  MRI of the brain negative. -Right supraclavicular lymph node biopsy on 05/20/2019 shows metastatic poorly differentiated squamous cell carcinoma. -4 cycles of carboplatin, paclitaxel and  pembrolizumab from 06/15/2019 through 08/18/2019. -CT CAP on 08/14/2019 showed right  lower lobe pulmonary nodule similar in size.  Splenic lesions slightly regressed.  New pathological compression fracture at L3.  Old pathological compression fracture at L2. -Maintenance pembrolizumab started on 09/16/2019. -She is continuing to tolerate pembrolizumab very well without any immunotherapy related side effects. -CT CAP on 10/26/2019 reviewed by me showed irregular spiculated central right lower lobe nodule measures 1.1 x 1.0, previously 1.4 x 0.9 cm.  Subtle hypoattenuating lesion in the right liver is less prominent today measuring 5 mm.  This was 10 mm previously.  A second hypoattenuating lesion seen in the posterior right liver is not seen on the current exam.  No new or progressive findings. -I reviewed her labs.  She will continue pembrolizumab at this time.  I will see her back in 3 weeks for follow-up.  2.  Taxane-induced leg pains: -Pains have improved since we stopped chemotherapy last 3 times.  3.  Constipation: -She will continue MiraLAX and stool softener.  4.  Low back pain: -She reports more pain in the back, mostly towards left side of the lower back.  She reportedly had back injection 3 weeks ago. -She is taking Aleve twice daily. -I have recommended MRI of the lumbar spine with and without contrast.  5.  Elevated creatinine: -Increased to 1.03.  Previously it was in the 0.6-0.7 range. -Etiology includes NSAIDs as well as immunotherapy.  We will keep a close eye on it.  She was told to drink plenty of fluids.  6.  Hypokalemia: -Potassium is 3.1.  We will give her potassium 40 mEq.    Orders placed this encounter:  Orders Placed This Encounter  Procedures  . MR Lumbar Spine W Wo Contrast  . CBC with Differential/Platelet  . Comprehensive metabolic panel  . Magnesium   Total time spent is 30 minutes with more than 50% of time spent face-to-face.   Derek Jack, MD [place] [phone]

## 2019-12-09 NOTE — Assessment & Plan Note (Addendum)
1.  Metastatic poorly differentiated squamous cell carcinoma of the lung: -PD-L1 TPS-10%, foundation 1 MS-stable, no other targetable mutations. -PET scan on 05/08/2019-hypermetabolic right hilar, mediastinal, right supraclavicular adenopathy with solitary small hypermetabolic nodule in the right lung measuring 1 cm.  Focal activity in the right posterior oropharynx with no CT correlate.  Widespread hypermetabolic skeletal metastasis and solitary splenic metastasis.  MRI of the brain negative. -Right supraclavicular lymph node biopsy on 05/20/2019 shows metastatic poorly differentiated squamous cell carcinoma. -4 cycles of carboplatin, paclitaxel and pembrolizumab from 06/15/2019 through 08/18/2019. -CT CAP on 08/14/2019 showed right lower lobe pulmonary nodule similar in size.  Splenic lesions slightly regressed.  New pathological compression fracture at L3.  Old pathological compression fracture at L2. -Maintenance pembrolizumab started on 09/16/2019. -She is continuing to tolerate pembrolizumab very well without any immunotherapy related side effects. -CT CAP on 10/26/2019 reviewed by me showed irregular spiculated central right lower lobe nodule measures 1.1 x 1.0, previously 1.4 x 0.9 cm.  Subtle hypoattenuating lesion in the right liver is less prominent today measuring 5 mm.  This was 10 mm previously.  A second hypoattenuating lesion seen in the posterior right liver is not seen on the current exam.  No new or progressive findings. -I reviewed her labs.  She will continue pembrolizumab at this time.  I will see her back in 3 weeks for follow-up.  2.  Taxane-induced leg pains: -Pains have improved since we stopped chemotherapy last 3 times.  3.  Constipation: -She will continue MiraLAX and stool softener.  4.  Low back pain: -She reports more pain in the back, mostly towards left side of the lower back.  She reportedly had back injection 3 weeks ago. -She is taking Aleve twice daily. -I have  recommended MRI of the lumbar spine with and without contrast.  5.  Elevated creatinine: -Increased to 1.03.  Previously it was in the 0.6-0.7 range. -Etiology includes NSAIDs as well as immunotherapy.  We will keep a close eye on it.  She was told to drink plenty of fluids.  6.  Hypokalemia: -Potassium is 3.1.  We will give her potassium 40 mEq.

## 2019-12-12 ENCOUNTER — Other Ambulatory Visit: Payer: Self-pay | Admitting: Nurse Practitioner

## 2019-12-16 ENCOUNTER — Other Ambulatory Visit: Payer: Self-pay

## 2019-12-16 ENCOUNTER — Ambulatory Visit (HOSPITAL_COMMUNITY)
Admission: RE | Admit: 2019-12-16 | Discharge: 2019-12-16 | Disposition: A | Discharge status: Home / Self Care | Payer: Medicare PPO | Source: Ambulatory Visit | Attending: Hematology | Admitting: Hematology

## 2019-12-16 DIAGNOSIS — C3490 Malignant neoplasm of unspecified part of unspecified bronchus or lung: Secondary | ICD-10-CM | POA: Diagnosis not present

## 2019-12-16 DIAGNOSIS — C7951 Secondary malignant neoplasm of bone: Secondary | ICD-10-CM | POA: Diagnosis not present

## 2019-12-16 DIAGNOSIS — C7800 Secondary malignant neoplasm of unspecified lung: Secondary | ICD-10-CM

## 2019-12-16 MED ORDER — GADOBUTROL 1 MMOL/ML IV SOLN
7.5000 mL | Freq: Once | INTRAVENOUS | Status: AC | PRN
  Administered 2019-12-16: 7.5 mL via INTRAVENOUS

## 2019-12-28 ENCOUNTER — Telehealth (HOSPITAL_COMMUNITY): Payer: Self-pay | Admitting: Surgery

## 2019-12-28 NOTE — Telephone Encounter (Signed)
Pt's daughter had left a voicemail this morning stating that her mother was having difficulty swallowing her food and her medications, and that she was having problems with acid reflux.  Dr. Delton Coombes made aware of the pt's problems and that the pt is already taking Prilosec.    Per Dr. Delton Coombes, the pt should start Maalox prn to see if this helps the acid reflux.  I called the pt's daugher back and [name] verbalized understanding and was told to call back if she had any more concerns.

## 2019-12-30 ENCOUNTER — Inpatient Hospital Stay (HOSPITAL_COMMUNITY): Payer: Medicare PPO

## 2019-12-30 ENCOUNTER — Encounter (HOSPITAL_COMMUNITY): Payer: Self-pay | Admitting: Hematology

## 2019-12-30 ENCOUNTER — Inpatient Hospital Stay (HOSPITAL_COMMUNITY): Payer: Medicare PPO | Attending: Hematology

## 2019-12-30 ENCOUNTER — Other Ambulatory Visit: Payer: Self-pay

## 2019-12-30 ENCOUNTER — Inpatient Hospital Stay (HOSPITAL_COMMUNITY): Payer: Medicare PPO | Admitting: Hematology

## 2019-12-30 VITALS — BP 106/63 | HR 75 | Temp 97.9°F | Resp 14

## 2019-12-30 VITALS — BP 136/80 | HR 68 | Temp 97.1°F | Resp 14 | Wt 174.4 lb

## 2019-12-30 DIAGNOSIS — E039 Hypothyroidism, unspecified: Secondary | ICD-10-CM | POA: Diagnosis not present

## 2019-12-30 DIAGNOSIS — Z7982 Long term (current) use of aspirin: Secondary | ICD-10-CM | POA: Insufficient documentation

## 2019-12-30 DIAGNOSIS — Z79899 Other long term (current) drug therapy: Secondary | ICD-10-CM | POA: Insufficient documentation

## 2019-12-30 DIAGNOSIS — K5900 Constipation, unspecified: Secondary | ICD-10-CM | POA: Diagnosis not present

## 2019-12-30 DIAGNOSIS — K219 Gastro-esophageal reflux disease without esophagitis: Secondary | ICD-10-CM | POA: Insufficient documentation

## 2019-12-30 DIAGNOSIS — R42 Dizziness and giddiness: Secondary | ICD-10-CM | POA: Diagnosis not present

## 2019-12-30 DIAGNOSIS — M8580 Other specified disorders of bone density and structure, unspecified site: Secondary | ICD-10-CM | POA: Insufficient documentation

## 2019-12-30 DIAGNOSIS — R944 Abnormal results of kidney function studies: Secondary | ICD-10-CM | POA: Insufficient documentation

## 2019-12-30 DIAGNOSIS — M545 Low back pain: Secondary | ICD-10-CM | POA: Insufficient documentation

## 2019-12-30 DIAGNOSIS — E785 Hyperlipidemia, unspecified: Secondary | ICD-10-CM | POA: Insufficient documentation

## 2019-12-30 DIAGNOSIS — Z87891 Personal history of nicotine dependence: Secondary | ICD-10-CM | POA: Insufficient documentation

## 2019-12-30 DIAGNOSIS — I10 Essential (primary) hypertension: Secondary | ICD-10-CM | POA: Insufficient documentation

## 2019-12-30 DIAGNOSIS — C7800 Secondary malignant neoplasm of unspecified lung: Secondary | ICD-10-CM | POA: Diagnosis not present

## 2019-12-30 DIAGNOSIS — Z5112 Encounter for antineoplastic immunotherapy: Secondary | ICD-10-CM | POA: Insufficient documentation

## 2019-12-30 DIAGNOSIS — G2581 Restless legs syndrome: Secondary | ICD-10-CM | POA: Insufficient documentation

## 2019-12-30 DIAGNOSIS — C799 Secondary malignant neoplasm of unspecified site: Secondary | ICD-10-CM

## 2019-12-30 DIAGNOSIS — R1310 Dysphagia, unspecified: Secondary | ICD-10-CM | POA: Insufficient documentation

## 2019-12-30 DIAGNOSIS — C3491 Malignant neoplasm of unspecified part of right bronchus or lung: Secondary | ICD-10-CM

## 2019-12-30 DIAGNOSIS — Z8673 Personal history of transient ischemic attack (TIA), and cerebral infarction without residual deficits: Secondary | ICD-10-CM | POA: Diagnosis not present

## 2019-12-30 LAB — CBC WITH DIFFERENTIAL/PLATELET
Abs Immature Granulocytes: 0.02 10*3/uL (ref 0.00–0.07)
Basophils Absolute: 0 10*3/uL (ref 0.0–0.1)
Basophils Relative: 1 %
Eosinophils Absolute: 0.2 10*3/uL (ref 0.0–0.5)
Eosinophils Relative: 3 %
HCT: 36 % (ref 36.0–46.0)
Hemoglobin: 11.1 g/dL — ABNORMAL LOW (ref 12.0–15.0)
Immature Granulocytes: 0 %
Lymphocytes Relative: 29 %
Lymphs Abs: 1.8 10*3/uL (ref 0.7–4.0)
MCH: 29.8 pg (ref 26.0–34.0)
MCHC: 30.8 g/dL (ref 30.0–36.0)
MCV: 96.5 fL (ref 80.0–100.0)
Monocytes Absolute: 0.5 10*3/uL (ref 0.1–1.0)
Monocytes Relative: 8 %
Neutro Abs: 3.7 10*3/uL (ref 1.7–7.7)
Neutrophils Relative %: 59 %
Platelets: 230 10*3/uL (ref 150–400)
RBC: 3.73 MIL/uL — ABNORMAL LOW (ref 3.87–5.11)
RDW: 15.9 % — ABNORMAL HIGH (ref 11.5–15.5)
WBC: 6.1 10*3/uL (ref 4.0–10.5)
nRBC: 0 % (ref 0.0–0.2)

## 2019-12-30 LAB — COMPREHENSIVE METABOLIC PANEL
ALT: 19 U/L (ref 0–44)
AST: 52 U/L — ABNORMAL HIGH (ref 15–41)
Albumin: 4.7 g/dL (ref 3.5–5.0)
Alkaline Phosphatase: 111 U/L (ref 38–126)
Anion gap: 10 (ref 5–15)
BUN: 8 mg/dL (ref 8–23)
CO2: 26 mmol/L (ref 22–32)
Calcium: 9.7 mg/dL (ref 8.9–10.3)
Chloride: 101 mmol/L (ref 98–111)
Creatinine, Ser: 1.02 mg/dL — ABNORMAL HIGH (ref 0.44–1.00)
GFR calc Af Amer: >60 mL/min (ref >60)
GFR calc non Af Amer: 55 mL/min — ABNORMAL LOW (ref >60)
Glucose, Bld: 82 mg/dL (ref 70–99)
Potassium: 3.7 mmol/L (ref 3.5–5.1)
Sodium: 137 mmol/L (ref 135–145)
Total Bilirubin: 0.9 mg/dL (ref 0.3–1.2)
Total Protein: 8.4 g/dL — ABNORMAL HIGH (ref 6.5–8.1)

## 2019-12-30 LAB — TSH
TSH: 135.361 u[IU]/mL — ABNORMAL HIGH (ref 0.350–4.500)
TSH: 117.54 u[IU]/mL — ABNORMAL HIGH (ref 0.350–4.500)

## 2019-12-30 LAB — MAGNESIUM: Magnesium: 2.1 mg/dL (ref 1.7–2.4)

## 2019-12-30 MED ORDER — HEPARIN SOD (PORK) LOCK FLUSH 100 UNIT/ML IV SOLN
500.0000 [IU] | Freq: Once | INTRAVENOUS | Status: AC | PRN
  Administered 2019-12-30: 500 [IU]

## 2019-12-30 MED ORDER — SODIUM CHLORIDE 0.9 % IV SOLN
200.0000 mg | Freq: Once | INTRAVENOUS | Status: AC
  Administered 2019-12-30: 200 mg via INTRAVENOUS
  Filled 2019-12-30: qty 8

## 2019-12-30 MED ORDER — SODIUM CHLORIDE 0.9% FLUSH
10.0000 mL | INTRAVENOUS | Status: DC | PRN
  Administered 2019-12-30: 10 mL

## 2019-12-30 MED ORDER — SODIUM CHLORIDE 0.9 % IV SOLN: Freq: Once | INTRAVENOUS | Status: AC

## 2019-12-30 NOTE — Progress Notes (Signed)
Patient presents today for treatment and follow up appointment with Dr. Delton Coombes. MAR reviewed and updated. Labs reviewed. Vital signs within parameters for treatment. Labs within parameters for treatment.   Message received from [facility] LPN/ Dr. Delton Coombes to proceed with treatment.   Treatment given today per MD orders. Tolerated infusion without adverse affects. Vital signs stable. No complaints at this time. Discharged from clinic via wheel chair.  F/U with [facility] as scheduled.

## 2019-12-30 NOTE — Patient Instructions (Addendum)
[facility] Discharge Instructions for Patients Receiving Chemotherapy   Beginning January 23rd 2017 lab work for the [facility] will be done in the  Main lab at [facility] on 1st floor. If you have a lab appointment with the [facility] [place] please come in thru the  Main Entrance and check in at the main information desk   Today you received the following chemotherapy agents Keytruda. Follow-up as scheduled. Call clinic for any questions or concerns  To help prevent nausea and vomiting after your treatment, we encourage you to take your nausea medication   If you develop nausea and vomiting, or diarrhea that is not controlled by your medication, call the clinic.  The clinic phone number is [phone]. Office hours are Monday-Friday 8:30am-5:00pm.  BELOW ARE SYMPTOMS THAT SHOULD BE REPORTED IMMEDIATELY:  *FEVER GREATER THAN 101.0 F  *CHILLS WITH OR WITHOUT FEVER  NAUSEA AND VOMITING THAT IS NOT CONTROLLED WITH YOUR NAUSEA MEDICATION  *UNUSUAL SHORTNESS OF BREATH  *UNUSUAL BRUISING OR BLEEDING  TENDERNESS IN MOUTH AND THROAT WITH OR WITHOUT PRESENCE OF ULCERS  *URINARY PROBLEMS  *BOWEL PROBLEMS  UNUSUAL RASH Items with * indicate a potential emergency and should be followed up as soon as possible. If you have an emergency after office hours please contact your primary care physician or go to the nearest emergency department.  Please call the clinic during office hours if you have any questions or concerns.   You may also contact the Patient Navigator at [phone] should you have any questions or need assistance in obtaining follow up care.      Resources For Cancer Patients and their Caregivers ? American Cancer Society: Can assist with transportation, wigs, general needs, runs Look Good Feel Better.        6208175778 ? Cancer Care: Provides financial assistance, online support groups, medication/co-pay assistance.  1-800-813-HOPE  214-823-7940) ? [place] Assists [place] cancer patients and their families through emotional , educational and financial support.  401-422-1329 ? [place] DSS Where to apply for food stamps, Medicaid and utility assistance. 703 661 1678 ? [facility]: Transportation to medical appointments. 414-853-5757 ? Social Security Administration: May apply for disability if have a Stage IV cancer. 505-658-0269 (928) 596-4828 ? [employer], Disability and Transit Services: Assists with nutrition, care and transit needs. [place] at [facility]  Discharge Instructions:   _______________________________________________________________  Thank you for choosing [place] at [facility] to provide your oncology and hematology care.  To afford each patient quality time with our providers, please arrive at least 15 minutes before your scheduled appointment.  You need to re-schedule your appointment if you arrive 10 or more minutes late.  We strive to give you quality time with our providers, and arriving late affects you and other patients whose appointments are after yours.  Also, if you no show three or more times for appointments you may be dismissed from the clinic.  Again, thank you for choosing [place] at [place] hope is that these requests will allow you access to exceptional care and in a timely manner. _______________________________________________________________  If you have questions after your visit, please contact our office at [phone] between the hours of 8:30 a.m. and 5:00 p.m. Voicemails left after 4:30 p.m. will not be returned until the following business day. _______________________________________________________________  For prescription refill requests, have your  pharmacy contact our  office. _______________________________________________________________  Recommendations made by the consultant and any test results will be sent to your referring physician. _______________________________________________________________

## 2019-12-30 NOTE — Progress Notes (Signed)
Patient has been assessed, vital signs and labs have been reviewed by Dr. Delton Coombes. ANC, Creatinine, LFTs, and Platelets are within treatment parameters per Dr. Delton Coombes. The patient is good to proceed with treatment at this time. Please add TSH, T3 and T4 to labs today.

## 2019-12-30 NOTE — Patient Instructions (Addendum)
[place] at [facility] Discharge Instructions  You were seen today by Dr. Delton Coombes. He went over your recent lab results. He will refer you to Dr. Laural Golden for evaluation. He will see you back in 3 weeks for labs, treatment and follow up.   Thank you for choosing [place] at [facility] to provide your oncology and hematology care.  To afford each patient quality time with our provider, please arrive at least 15 minutes before your scheduled appointment time.   If you have a lab appointment with the [place] please come in thru the  Main Entrance and check in at the main information desk  You need to re-schedule your appointment should you arrive 10 or more minutes late.  We strive to give you quality time with our providers, and arriving late affects you and other patients whose appointments are after yours.  Also, if you no show three or more times for appointments you may be dismissed from the clinic at the providers discretion.     Again, thank you for choosing [facility].  Our hope is that these requests will decrease the amount of time that you wait before being seen by our physicians.       _____________________________________________________________  Should you have questions after your visit to [facility], please contact our office at [phone] between the hours of 8:00 a.m. and 4:30 p.m.  Voicemails left after 4:00 p.m. will not be returned until the following business day.  For prescription refill requests, have your pharmacy contact our office and allow 72 hours.    Cancer Center Support Programs:   > Cancer Support Group  2nd Tuesday of the month 1pm-2pm, Journey Room

## 2019-12-30 NOTE — Progress Notes (Signed)
[place], [place] [address]   CLINIC:  Medical Oncology/Hematology  PCP:  [name], MD [address] [place] [id number] [phone]   REASON FOR VISIT: Metastatic squamous cell lung cancer.   INTERVAL HISTORY:  Ms. Rosner 74 y.o. female is seen for follow-up of metastatic lung cancer and toxicity assessment prior to next cycle of immunotherapy.  She complains of lower back pain with radiation to the left hip and upper thigh.  Appetite is 25%.  Energy levels are low.  Occasional dizziness in the mornings.  She is taking Tylenol for the pain.  Reports some difficulty swallowing with no relation to the type of foods or pills.  She reportedly had this problem before.  She thinks it could be coming from her acid reflux.  She is already taking proton pump inhibitor.  REVIEW OF SYSTEMS:  Review of Systems  HENT:   Positive for trouble swallowing.   Musculoskeletal: Positive for back pain.  Neurological: Positive for dizziness and numbness.  All other systems reviewed and are negative.    PAST MEDICAL/SURGICAL HISTORY:  Past Medical History:  Diagnosis Date  . Atypical chest pain 05/23/2012   STRESS TEST - small to moderate sized area of partial reversibility of the anteroapical wall, most likely breast artifact, post-stress EF 67%, EKG show NSR at 65, No Lexiscan EKG changes, non-diagnostic for ischemia; STRESS TEST, 01/25/2010 - normal study, post-stress EF 66%, no significant ischemia  . Cancer ([place])   . Cerebral atherosclerosis 09/29/2012   CAROTID DUPLEX - RIGHT  BULB/PROXIMAL ICA-moderate amount of fibrous plaque, 50-69% diameter reduction; LEFT CEA-normal, no significant diameter reduction  . Colitis   . GERD (gastroesophageal reflux disease)   . Hyperlipidemia   . Hypertension   . Osteopenia   . Restless leg syndrome   . Shortness of breath 03/30/2005   2D ECHO - EF >55%, normal  . TIA (transient ischemic attack)  01/27/2010   2D ECHO - EF 65%, normal   Past Surgical History:  Procedure Laterality Date  . ABDOMINAL EXPLORATION SURGERY    . CAROTID ENDARTERECTOMY    . LYMPH NODE BIOPSY N/A 05/20/2019   Procedure: LYMPH NODE BIOPSY CERVICAL;  Surgeon: Aviva Signs, MD;  Location: [facility];  Service: General;  Laterality: N/A;  . Peripheral Vascular Catheterization  07/16/2005   Right verterbral-50% smooth segmental badsilar artery stenosis on right side, Right carotid-50% ulcerated proxmial stenosis, Left carotid-60 to 70% left externam carotid artery stenosis, 90% proximal left ICA stenosis  . PORTACATH PLACEMENT Left 06/12/2019   Procedure: INSERTION PORT-A-CATH (catheter attached left subclavian);  Surgeon: Aviva Signs, MD;  Location: [facility];  Service: General;  Laterality: Left;     SOCIAL HISTORY:  Social History   Socioeconomic History  . Marital status: Divorced    Spouse name: Not on file  . Number of children: 2  . Years of education: Not on file  . Highest education level: Not on file  Occupational History  . Not on file  Tobacco Use  . Smoking status: Former Smoker    Packs/day: 0.25    Years: 10.00    Pack years: 2.50    Types: Cigarettes    Quit date: 04/21/1983    Years since quitting: 36.7  . Smokeless tobacco: Never Used  Substance and Sexual Activity  . Alcohol use: No  . Drug use: No  . Sexual activity: Not Currently  Other Topics Concern  .  Not on file  Social History Narrative  . Not on file   Social Determinants of Health   Financial Resource Strain: Low Risk   . Difficulty of Paying Living Expenses: Not hard at all  Food Insecurity: No Food Insecurity  . Worried About Charity fundraiser in the Last Year: Never true  . Ran Out of Food in the Last Year: Never true  Transportation Needs: No Transportation Needs  . Lack of Transportation (Medical): No  . Lack of Transportation (Non-Medical): No  Physical Activity: Inactive  . Days of Exercise per Week: 0 days   . Minutes of Exercise per Session: 0 min  Stress: No Stress Concern Present  . Feeling of Stress : Not at all  Social Connections: Somewhat Isolated  . Frequency of Communication with Friends and Family: Twice a week  . Frequency of Social Gatherings with Friends and Family: Twice a week  . Attends Religious Services: More than 4 times per year  . Active Member of Clubs or Organizations: No  . Attends Archivist Meetings: Never  . Marital Status: Divorced  Human resources officer Violence: Not At Risk  . Fear of Current or Ex-Partner: No  . Emotionally Abused: No  . Physically Abused: No  . Sexually Abused: No    FAMILY HISTORY:  Family History  Problem Relation Age of Onset  . Heart disease Mother   . Hypertension Mother   . Hypertension Father   . Heart disease Father     CURRENT MEDICATIONS:  Outpatient Encounter Medications as of 12/30/2019  Medication Sig  . aspirin 81 MG tablet Take 81 mg by mouth at bedtime.   . gabapentin (NEURONTIN) 100 MG capsule Take 2 capsules (200 mg total) by mouth at bedtime.  [name] loratadine (CLARITIN) 10 MG tablet Take 10 mg by mouth daily as needed for allergies or itching.   . losartan (COZAAR) 50 MG tablet Take 1/2 (one-half) tablet by mouth once daily (Patient taking differently: Take 25 mg by mouth daily. )  . naproxen sodium (ALEVE) 220 MG tablet Take 220 mg by mouth 2 (two) times daily as needed (pain).   . nitroGLYCERIN (NITROSTAT) 0.4 MG SL tablet DISSOLVE ONE TABLET UNDER THE TONGUE AS NEEDED FOR CHEST PAIN**MAX 3 TABLETS EACH 5 MINUTES APART**  . Omega-3 Krill Oil 500 MG CAPS Take 500 mg by mouth daily.   [name] omeprazole (PRILOSEC) 20 MG capsule Take 1 po daily (Patient taking differently: Take 20 mg by mouth 2 (two) times daily. )  . polyethylene glycol powder (GLYCOLAX/MIRALAX) 17 GM/SCOOP powder Take 17 g by mouth 2 (two) times daily. Until daily soft stools  OTC  . rosuvastatin (CRESTOR) 20 MG tablet Take 20 mg by mouth at bedtime.   . senna (SENOKOT) 8.6 MG TABS tablet Take 2 tablets by mouth daily.  . simvastatin (ZOCOR) 20 MG tablet Take 1 tablet (20 mg total) by mouth at bedtime.   Facility-Administered Encounter Medications as of 12/30/2019  Medication  . sodium chloride flush (NS) 0.9 % injection 10 mL    ALLERGIES:  Allergies  Allergen Reactions  . Penicillins Shortness Of Breath and Other (See Comments)    Has patient had a PCN reaction causing immediate rash, facial/tongue/throat swelling, SOB or lightheadedness with hypotension: Yes Has patient had a PCN reaction causing severe rash involving mucus membranes or skin necrosis: Unk Has patient had a PCN reaction that required hospitalization: Unk Has patient had a PCN reaction occurring within the last 10 years:  Yes "States that she was in ICU after being administered"   . Codeine Nausea And Vomiting  . Lipitor [Atorvastatin] Other (See Comments)    Myalgias  . Lisinopril Cough  . Pravastatin Other (See Comments)    Myalgias     PHYSICAL EXAM:  ECOG Performance status: 1  Vitals:   12/30/19 0912  BP: 136/80  Pulse: 68  Resp: 14  Temp: (!) 97.1 F (36.2 C)  SpO2: 99%   Filed Weights   12/30/19 0912  Weight: 174 lb 6.4 oz (79.1 kg)    Physical Exam Constitutional:      Appearance: Normal appearance. She is normal weight.  Cardiovascular:     Rate and Rhythm: Normal rate and regular rhythm.     Heart sounds: Normal heart sounds.  Pulmonary:     Effort: Pulmonary effort is normal.     Breath sounds: Normal breath sounds.  Abdominal:     General: Bowel sounds are normal.     Palpations: Abdomen is soft.  Musculoskeletal:        General: Normal range of motion.  Skin:    General: Skin is warm and dry.  Neurological:     Mental Status: She is alert and oriented to person, place, and time. Mental status is at baseline.  Psychiatric:        Mood and Affect: Mood normal.        Behavior: Behavior normal.        Thought Content:  Thought content normal.        Judgment: Judgment normal.      LABORATORY DATA:  I have reviewed the labs as listed.  CBC    Component Value Date/Time   WBC 6.1 12/30/2019 0849   RBC 3.73 (L) 12/30/2019 0849   HGB 11.1 (L) 12/30/2019 0849   HCT 36.0 12/30/2019 0849   PLT 230 12/30/2019 0849   MCV 96.5 12/30/2019 0849   MCH 29.8 12/30/2019 0849   MCHC 30.8 12/30/2019 0849   RDW 15.9 (H) 12/30/2019 0849   LYMPHSABS 1.8 12/30/2019 0849   MONOABS 0.5 12/30/2019 0849   EOSABS 0.2 12/30/2019 0849   BASOSABS 0.0 12/30/2019 0849   CMP Latest Ref Rng & Units 12/30/2019 12/09/2019 11/18/2019  Glucose 70 - 99 mg/dL 82 86 69(L)  BUN 8 - 23 mg/dL '8 11 16  ' Creatinine 0.44 - 1.00 mg/dL 1.02(H) 1.03(H) 0.98  Sodium 135 - 145 mmol/L 137 139 138  Potassium 3.5 - 5.1 mmol/L 3.7 3.1(L) 3.6  Chloride 98 - 111 mmol/L 101 98 102  CO2 22 - 32 mmol/L '26 27 25  ' Calcium 8.9 - 10.3 mg/dL 9.7 9.2 9.1  Total Protein 6.5 - 8.1 g/dL 8.4(H) 7.9 7.3  Total Bilirubin 0.3 - 1.2 mg/dL 0.9 1.0 0.5  Alkaline Phos 38 - 126 U/L 111 105 86  AST 15 - 41 U/L 52(H) 59(H) 31  ALT 0 - 44 U/L 19 34 19       DIAGNOSTIC IMAGING:  I have independently reviewed the scans and discussed with the patient.   ASSESSMENT & PLAN:   Squamous cell carcinoma of lung, stage IV, right ([place]) 1.  Metastatic squamous cell carcinoma of the lung, PD-L1 TPS-10%, foundation 1 with no targetable mutations: -4 cycles of carboplatin, paclitaxel and pembrolizumab from 06/15/2019 through 08/18/2019 Maintenance pembrolizumab started on 09/08/2019. -We reviewed her labs which are adequate to proceed with next cycle of pembrolizumab.  LFTs and creatinine were normal.  2.  Low back pain: -MRI  of the back dated 12/16/2019 reviewed by me showed L2 and L3 compression fractures.  There is suggestion of new metastasis at T12, but it was compared to prior MRI from May 2020.  Hence I do not think it is a true progression on current therapy. -We will  reach out to Dr.Xu for further opinion.  She is currently taking Tylenol as needed. -She was told to take Advil 1-2 times per day.  She does not want to take any narcotics.  3.  Constipation: -She will continue stool softener and MiraLAX as needed.  4.  Hypothyroidism: -TSH today has increased to 135.  We will repeat TSH and also a free T3 and T4. -We will consider referral to Dr. Dorris Fetch.  5.  Trouble swallowing: -For the past 2 weeks he complained of trouble swallowing both solids and liquids and occasionally pills also. -He has acid reflux and is on proton pump inhibitor for a long time.  She reported similar symptoms few years ago. -We will make referral to Dr. Laural Golden for further evaluation.  6.  Elevated creatinine: -Creatinine has been high at 1.03 for the last few times.  Her baseline is 0.6-0.7. -This could be from immunotherapy.  We will keep a close eye on it.  She will increase to drink lots of fluids.    Orders placed this encounter:  Orders Placed This Encounter  Procedures  . TSH  . T3, free  . T4     Derek Jack, MD [place] [phone]

## 2019-12-31 ENCOUNTER — Telehealth (HOSPITAL_COMMUNITY): Payer: Self-pay | Admitting: Surgery

## 2019-12-31 LAB — T3, FREE: T3, Free: 0.5 pg/mL — ABNORMAL LOW (ref 2.0–4.4)

## 2019-12-31 LAB — T4: T4, Total: <0.4 ug/dL — ABNORMAL LOW (ref 4.5–12.0)

## 2019-12-31 NOTE — Telephone Encounter (Signed)
The pt's daughter [name] called asking why the pt received a referral from a physician concerning the pt's thyroid.  I checked with Andee Poles to see which referrals the pt had.  The pt has referrals to Dr. Laural Golden and to Dr. Dorris Fetch, based on the pt's abnormal thyroid levels.  I called [name] back and she stated that no one mentioned the pt having abnormal thyroid levels, and that the pt would not go to the appointment with Dr. Dorris Fetch.  I will let Caryl Pina and Dr. Delton Coombes know of the pt's decision.

## 2019-12-31 NOTE — Telephone Encounter (Signed)
After letting Dr. Delton Coombes and Caryl Pina know that the pt was not planning to go to the appointment with Dr. Dorris Fetch, they let me know that the abnormal thyroid levels could be due to her treatment and that the pt really needs to be seen by the endocrinologist.  I called the pt's daughter [name] back to let her know that Dr. Delton Coombes apologized for not letting them know about the abnormal thyroid levels, and that the pt needs to see Dr. Dorris Fetch since her treatment could be causing the abnormal levels.  [name] verbalized understanding and said that she would talk to her mom about keeping the appointment.

## 2020-01-02 ENCOUNTER — Other Ambulatory Visit: Payer: Self-pay

## 2020-01-02 ENCOUNTER — Ambulatory Visit: Payer: Medicare PPO | Attending: Internal Medicine

## 2020-01-02 DIAGNOSIS — Z23 Encounter for immunization: Secondary | ICD-10-CM | POA: Insufficient documentation

## 2020-01-02 NOTE — Assessment & Plan Note (Signed)
1.  Metastatic squamous cell carcinoma of the lung, PD-L1 TPS-10%, foundation 1 with no targetable mutations: -4 cycles of carboplatin, paclitaxel and pembrolizumab from 06/15/2019 through 08/18/2019 Maintenance pembrolizumab started on 09/08/2019. -We reviewed her labs which are adequate to proceed with next cycle of pembrolizumab.  LFTs and creatinine were normal.  2.  Low back pain: -MRI of the back dated 12/16/2019 reviewed by me showed L2 and L3 compression fractures.  There is suggestion of new metastasis at T12, but it was compared to prior MRI from May 2020.  Hence I do not think it is a true progression on current therapy. -We will reach out to Dr.Xu for further opinion.  She is currently taking Tylenol as needed. -She was told to take Advil 1-2 times per day.  She does not want to take any narcotics.  3.  Constipation: -She will continue stool softener and MiraLAX as needed.  4.  Hypothyroidism: -TSH today has increased to 135.  We will repeat TSH and also a free T3 and T4. -We will consider referral to Dr. Dorris Fetch.  5.  Trouble swallowing: -For the past 2 weeks he complained of trouble swallowing both solids and liquids and occasionally pills also. -He has acid reflux and is on proton pump inhibitor for a long time.  She reported similar symptoms few years ago. -We will make referral to Dr. Laural Golden for further evaluation.  6.  Elevated creatinine: -Creatinine has been high at 1.03 for the last few times.  Her baseline is 0.6-0.7. -This could be from immunotherapy.  We will keep a close eye on it.  She will increase to drink lots of fluids.

## 2020-01-02 NOTE — Progress Notes (Signed)
   Covid-19 Vaccination Clinic  Name:  Lindsey Mullins    MRN: [id number] DOB: May 16, 1946  01/02/2020  Lindsey Mullins was observed post Covid-19 immunization for 15 minutes without incidence. She was provided with Vaccine Information Sheet and instruction to access the V-Safe system.   Lindsey Mullins was instructed to call 911 with any severe reactions post vaccine: [name] Difficulty breathing  . Swelling of your face and throat  . A fast heartbeat  . A bad rash all over your body  . Dizziness and weakness    Immunizations Administered    Name Date Dose VIS Date Route   Moderna COVID-19 Vaccine 01/02/2020 11:56 AM 0.5 mL 10/20/2019 Intramuscular   Manufacturer: Moderna   Lot: [id number]   [place]: [id number]

## 2020-01-05 ENCOUNTER — Encounter (INDEPENDENT_AMBULATORY_CARE_PROVIDER_SITE_OTHER): Payer: Self-pay | Admitting: Gastroenterology

## 2020-01-06 ENCOUNTER — Other Ambulatory Visit (HOSPITAL_COMMUNITY): Payer: Self-pay | Admitting: *Deleted

## 2020-01-06 ENCOUNTER — Other Ambulatory Visit: Payer: Self-pay

## 2020-01-06 ENCOUNTER — Ambulatory Visit (HOSPITAL_COMMUNITY)
Admission: RE | Admit: 2020-01-06 | Discharge: 2020-01-06 | Disposition: A | Discharge status: Home / Self Care | Payer: Medicare PPO | Source: Ambulatory Visit | Attending: Hematology | Admitting: Hematology

## 2020-01-06 DIAGNOSIS — R0781 Pleurodynia: Secondary | ICD-10-CM | POA: Insufficient documentation

## 2020-01-06 DIAGNOSIS — M546 Pain in thoracic spine: Secondary | ICD-10-CM

## 2020-01-07 ENCOUNTER — Telehealth (HOSPITAL_COMMUNITY): Payer: Self-pay | Admitting: *Deleted

## 2020-01-07 NOTE — Telephone Encounter (Signed)
Pt's daughter, [name], phoned the clinic to report Pixie has been c/o "severe" pain to left rib cage x 2-3 days.  She reports that pain is worse with movement, but not with deep inspiration.  Dr. Delton Coombes notified of above.  Orders received for left sided rib series x-ray and MRI thoracic spine with and w/o contrast. [name] was notified of the above orders and informed that she could come for the rib x-ray at anytime, and that a scheduler would call her with an appt date/time for MRI.  She verbalizes understanding.

## 2020-01-14 ENCOUNTER — Other Ambulatory Visit (HOSPITAL_COMMUNITY): Payer: Self-pay | Admitting: Nurse Practitioner

## 2020-01-14 ENCOUNTER — Ambulatory Visit (HOSPITAL_COMMUNITY): Admission: RE | Admit: 2020-01-14 | Payer: Medicare PPO | Source: Ambulatory Visit

## 2020-01-15 ENCOUNTER — Ambulatory Visit (HOSPITAL_COMMUNITY)
Admission: RE | Admit: 2020-01-15 | Discharge: 2020-01-15 | Disposition: A | Discharge status: Home / Self Care | Payer: Medicare PPO | Source: Ambulatory Visit | Attending: Hematology | Admitting: Hematology

## 2020-01-15 ENCOUNTER — Other Ambulatory Visit (HOSPITAL_COMMUNITY): Payer: Self-pay | Admitting: Hematology

## 2020-01-15 ENCOUNTER — Other Ambulatory Visit: Payer: Self-pay

## 2020-01-15 DIAGNOSIS — M546 Pain in thoracic spine: Secondary | ICD-10-CM

## 2020-01-15 DIAGNOSIS — C7951 Secondary malignant neoplasm of bone: Secondary | ICD-10-CM | POA: Diagnosis not present

## 2020-01-15 DIAGNOSIS — C3490 Malignant neoplasm of unspecified part of unspecified bronchus or lung: Secondary | ICD-10-CM | POA: Diagnosis not present

## 2020-01-15 MED ORDER — GADOBUTROL 1 MMOL/ML IV SOLN: 7.0000 mL | Freq: Once | INTRAVENOUS | Status: DC | PRN

## 2020-01-15 NOTE — Progress Notes (Signed)
2/26 Patient for MRI T spine wo/w SRS Proto , unable to get IV access, after 5 failed attempts patient refused further attempts. Patient also refused to allow her port a cath to be used for this exam stating she only wanted it for her Chemo treatments. We spoke with her daughter and explained we could not gain IV access and she refused to allow [place] to use her port. MRI T spine SRS  was done without contrast bhj

## 2020-01-20 ENCOUNTER — Encounter (HOSPITAL_COMMUNITY): Payer: Self-pay | Admitting: Lab

## 2020-01-20 ENCOUNTER — Encounter (HOSPITAL_COMMUNITY): Payer: Self-pay | Admitting: Hematology

## 2020-01-20 ENCOUNTER — Inpatient Hospital Stay (HOSPITAL_COMMUNITY): Payer: Medicare PPO | Attending: Hematology

## 2020-01-20 ENCOUNTER — Inpatient Hospital Stay (HOSPITAL_COMMUNITY): Payer: Medicare PPO | Admitting: Hematology

## 2020-01-20 ENCOUNTER — Inpatient Hospital Stay (HOSPITAL_COMMUNITY): Payer: Medicare PPO

## 2020-01-20 ENCOUNTER — Other Ambulatory Visit: Payer: Self-pay

## 2020-01-20 VITALS — BP 134/83 | HR 74 | Temp 97.3°F | Resp 16 | Wt 174.2 lb

## 2020-01-20 VITALS — BP 143/86 | HR 71 | Temp 97.8°F | Resp 18

## 2020-01-20 DIAGNOSIS — E785 Hyperlipidemia, unspecified: Secondary | ICD-10-CM | POA: Diagnosis not present

## 2020-01-20 DIAGNOSIS — R1310 Dysphagia, unspecified: Secondary | ICD-10-CM | POA: Insufficient documentation

## 2020-01-20 DIAGNOSIS — Z8673 Personal history of transient ischemic attack (TIA), and cerebral infarction without residual deficits: Secondary | ICD-10-CM | POA: Insufficient documentation

## 2020-01-20 DIAGNOSIS — K5900 Constipation, unspecified: Secondary | ICD-10-CM | POA: Insufficient documentation

## 2020-01-20 DIAGNOSIS — Z7982 Long term (current) use of aspirin: Secondary | ICD-10-CM | POA: Insufficient documentation

## 2020-01-20 DIAGNOSIS — C7800 Secondary malignant neoplasm of unspecified lung: Secondary | ICD-10-CM | POA: Diagnosis not present

## 2020-01-20 DIAGNOSIS — C799 Secondary malignant neoplasm of unspecified site: Secondary | ICD-10-CM

## 2020-01-20 DIAGNOSIS — M8580 Other specified disorders of bone density and structure, unspecified site: Secondary | ICD-10-CM | POA: Insufficient documentation

## 2020-01-20 DIAGNOSIS — M545 Low back pain: Secondary | ICD-10-CM | POA: Insufficient documentation

## 2020-01-20 DIAGNOSIS — G2581 Restless legs syndrome: Secondary | ICD-10-CM | POA: Diagnosis not present

## 2020-01-20 DIAGNOSIS — I10 Essential (primary) hypertension: Secondary | ICD-10-CM | POA: Diagnosis not present

## 2020-01-20 DIAGNOSIS — Z79899 Other long term (current) drug therapy: Secondary | ICD-10-CM | POA: Diagnosis not present

## 2020-01-20 DIAGNOSIS — K219 Gastro-esophageal reflux disease without esophagitis: Secondary | ICD-10-CM | POA: Diagnosis not present

## 2020-01-20 DIAGNOSIS — Z5112 Encounter for antineoplastic immunotherapy: Secondary | ICD-10-CM | POA: Diagnosis not present

## 2020-01-20 DIAGNOSIS — Z87891 Personal history of nicotine dependence: Secondary | ICD-10-CM | POA: Diagnosis not present

## 2020-01-20 DIAGNOSIS — G479 Sleep disorder, unspecified: Secondary | ICD-10-CM | POA: Diagnosis not present

## 2020-01-20 DIAGNOSIS — E039 Hypothyroidism, unspecified: Secondary | ICD-10-CM | POA: Insufficient documentation

## 2020-01-20 DIAGNOSIS — C3491 Malignant neoplasm of unspecified part of right bronchus or lung: Secondary | ICD-10-CM | POA: Diagnosis not present

## 2020-01-20 DIAGNOSIS — R944 Abnormal results of kidney function studies: Secondary | ICD-10-CM | POA: Insufficient documentation

## 2020-01-20 LAB — COMPREHENSIVE METABOLIC PANEL
ALT: 16 U/L (ref 0–44)
AST: 48 U/L — ABNORMAL HIGH (ref 15–41)
Albumin: 4.2 g/dL (ref 3.5–5.0)
Alkaline Phosphatase: 109 U/L (ref 38–126)
Anion gap: 12 (ref 5–15)
BUN: 9 mg/dL (ref 8–23)
CO2: 25 mmol/L (ref 22–32)
Calcium: 9.4 mg/dL (ref 8.9–10.3)
Chloride: 100 mmol/L (ref 98–111)
Creatinine, Ser: 1.02 mg/dL — ABNORMAL HIGH (ref 0.44–1.00)
GFR calc Af Amer: >60 mL/min (ref >60)
GFR calc non Af Amer: 55 mL/min — ABNORMAL LOW (ref >60)
Glucose, Bld: 77 mg/dL (ref 70–99)
Potassium: 3.4 mmol/L — ABNORMAL LOW (ref 3.5–5.1)
Sodium: 137 mmol/L (ref 135–145)
Total Bilirubin: 0.8 mg/dL (ref 0.3–1.2)
Total Protein: 8.2 g/dL — ABNORMAL HIGH (ref 6.5–8.1)

## 2020-01-20 LAB — CBC WITH DIFFERENTIAL/PLATELET
Abs Immature Granulocytes: 0.02 10*3/uL (ref 0.00–0.07)
Basophils Absolute: 0 10*3/uL (ref 0.0–0.1)
Basophils Relative: 0 %
Eosinophils Absolute: 0.1 10*3/uL (ref 0.0–0.5)
Eosinophils Relative: 2 %
HCT: 34.6 % — ABNORMAL LOW (ref 36.0–46.0)
Hemoglobin: 10.9 g/dL — ABNORMAL LOW (ref 12.0–15.0)
Immature Granulocytes: 0 %
Lymphocytes Relative: 40 %
Lymphs Abs: 2.1 10*3/uL (ref 0.7–4.0)
MCH: 29.5 pg (ref 26.0–34.0)
MCHC: 31.5 g/dL (ref 30.0–36.0)
MCV: 93.8 fL (ref 80.0–100.0)
Monocytes Absolute: 0.4 10*3/uL (ref 0.1–1.0)
Monocytes Relative: 8 %
Neutro Abs: 2.6 10*3/uL (ref 1.7–7.7)
Neutrophils Relative %: 50 %
Platelets: 256 10*3/uL (ref 150–400)
RBC: 3.69 MIL/uL — ABNORMAL LOW (ref 3.87–5.11)
RDW: 17.8 % — ABNORMAL HIGH (ref 11.5–15.5)
WBC: 5.3 10*3/uL (ref 4.0–10.5)
nRBC: 0 % (ref 0.0–0.2)

## 2020-01-20 MED ORDER — SODIUM CHLORIDE 0.9 % IV SOLN: Freq: Once | INTRAVENOUS | Status: AC

## 2020-01-20 MED ORDER — SODIUM CHLORIDE 0.9 % IV SOLN
200.0000 mg | Freq: Once | INTRAVENOUS | Status: AC
  Administered 2020-01-20: 200 mg via INTRAVENOUS
  Filled 2020-01-20: qty 8

## 2020-01-20 MED ORDER — HEPARIN SOD (PORK) LOCK FLUSH 100 UNIT/ML IV SOLN
500.0000 [IU] | Freq: Once | INTRAVENOUS | Status: AC | PRN
  Administered 2020-01-20: 500 [IU]

## 2020-01-20 MED ORDER — SODIUM CHLORIDE 0.9% FLUSH
10.0000 mL | INTRAVENOUS | Status: DC | PRN
  Administered 2020-01-20: 10 mL

## 2020-01-20 NOTE — Progress Notes (Signed)
Patient presents today for follow up visit and treatment. Labs within parameters for treatment today. MAR reviewed. Vital signs within parameters for treatment. Patient has complaints of pain today in her hip that is chronic. Patient rates the pain a 10. Patients daughter states her mother has appointment to get an injection in her hip by her Orthopedic physician.   Message received from [facility] LPN/ Dr. Delton Coombes. Proceed with treatment.   Treatment given today per MD orders. Tolerated infusion without adverse affects. Vital signs stable. No complaints at this time. Discharged from clinic via wheel chair.  F/U with [facility] as scheduled.

## 2020-01-20 NOTE — Progress Notes (Signed)
[name], [name] [address]   CLINIC:  Medical Oncology/Hematology  PCP:  [name], MD [address] [place] [address] [phone]   REASON FOR VISIT: Metastatic squamous cell lung cancer.   INTERVAL HISTORY:  Lindsey Mullins 74 y.o. female seen for follow-up of metastatic lung cancer and toxicity assessment prior to next cycle of immunotherapy.  She complains of lower back pain with radiation to the left hip and upper thigh.  She is not moving around because of the pain.  She has some trouble swallowing with solids.  She is only taking NSAIDs as needed.  She does not want to take Dilaudid.  Appetite and energy levels are 50%.  She is having sleep problems from pain.  Numbness has been stable in the extremities.  Denies any weakness in the legs.  REVIEW OF SYSTEMS:  Review of Systems  HENT:   Positive for trouble swallowing.   Musculoskeletal: Positive for back pain.  Neurological: Positive for numbness.  Psychiatric/Behavioral: Positive for sleep disturbance.  All other systems reviewed and are negative.    PAST MEDICAL/SURGICAL HISTORY:  Past Medical History:  Diagnosis Date  . Atypical chest pain 05/23/2012   STRESS TEST - small to moderate sized area of partial reversibility of the anteroapical wall, most likely breast artifact, post-stress EF 67%, EKG show NSR at 65, No Lexiscan EKG changes, non-diagnostic for ischemia; STRESS TEST, 01/25/2010 - normal study, post-stress EF 66%, no significant ischemia  . Cancer ([place])   . Cerebral atherosclerosis 09/29/2012   CAROTID DUPLEX - RIGHT  BULB/PROXIMAL ICA-moderate amount of fibrous plaque, 50-69% diameter reduction; LEFT CEA-normal, no significant diameter reduction  . Colitis   . GERD (gastroesophageal reflux disease)   . Hyperlipidemia   . Hypertension   . Osteopenia   . Restless leg syndrome   . Shortness of breath 03/30/2005   2D ECHO - EF >55%, normal  . TIA  (transient ischemic attack) 01/27/2010   2D ECHO - EF 65%, normal   Past Surgical History:  Procedure Laterality Date  . ABDOMINAL EXPLORATION SURGERY    . CAROTID ENDARTERECTOMY    . LYMPH NODE BIOPSY N/A 05/20/2019   Procedure: LYMPH NODE BIOPSY CERVICAL;  Surgeon: Aviva Signs, MD;  Location: [facility];  Service: General;  Laterality: N/A;  . Peripheral Vascular Catheterization  07/16/2005   Right verterbral-50% smooth segmental badsilar artery stenosis on right side, Right carotid-50% ulcerated proxmial stenosis, Left carotid-60 to 70% left externam carotid artery stenosis, 90% proximal left ICA stenosis  . PORTACATH PLACEMENT Left 06/12/2019   Procedure: INSERTION PORT-A-CATH (catheter attached left subclavian);  Surgeon: Aviva Signs, MD;  Location: [facility];  Service: General;  Laterality: Left;     SOCIAL HISTORY:  Social History   Socioeconomic History  . Marital status: Divorced    Spouse name: Not on file  . Number of children: 2  . Years of education: Not on file  . Highest education level: Not on file  Occupational History  . Not on file  Tobacco Use  . Smoking status: Former Smoker    Packs/day: 0.25    Years: 10.00    Pack years: 2.50    Types: Cigarettes    Quit date: 04/21/1983    Years since quitting: 36.7  . Smokeless tobacco: Never Used  Substance and Sexual Activity  . Alcohol use: No  . Drug use: No  . Sexual activity: Not Currently  Other  Topics Concern  . Not on file  Social History Narrative  . Not on file   Social Determinants of Health   Financial Resource Strain: Low Risk   . Difficulty of Paying Living Expenses: Not hard at all  Food Insecurity: No Food Insecurity  . Worried About Charity fundraiser in the Last Year: Never true  . Ran Out of Food in the Last Year: Never true  Transportation Needs: No Transportation Needs  . Lack of Transportation (Medical): No  . Lack of Transportation (Non-Medical): No  Physical Activity: Inactive  . Days  of Exercise per Week: 0 days  . Minutes of Exercise per Session: 0 min  Stress: No Stress Concern Present  . Feeling of Stress : Not at all  Social Connections: Somewhat Isolated  . Frequency of Communication with Friends and Family: Twice a week  . Frequency of Social Gatherings with Friends and Family: Twice a week  . Attends Religious Services: More than 4 times per year  . Active Member of Clubs or Organizations: No  . Attends Archivist Meetings: Never  . Marital Status: Divorced  Human resources officer Violence: Not At Risk  . Fear of Current or Ex-Partner: No  . Emotionally Abused: No  . Physically Abused: No  . Sexually Abused: No    FAMILY HISTORY:  Family History  Problem Relation Age of Onset  . Heart disease Mother   . Hypertension Mother   . Hypertension Father   . Heart disease Father     CURRENT MEDICATIONS:  Outpatient Encounter Medications as of 01/20/2020  Medication Sig  . aspirin 81 MG tablet Take 81 mg by mouth at bedtime.   . gabapentin (NEURONTIN) 100 MG capsule Take 2 capsules (200 mg total) by mouth at bedtime.  [name] loratadine (CLARITIN) 10 MG tablet Take 10 mg by mouth daily as needed for allergies or itching.   . losartan (COZAAR) 50 MG tablet Take 1/2 (one-half) tablet by mouth once daily (Patient taking differently: Take 25 mg by mouth daily. )  . naproxen sodium (ALEVE) 220 MG tablet Take 220 mg by mouth 2 (two) times daily as needed (pain).   . nitroGLYCERIN (NITROSTAT) 0.4 MG SL tablet DISSOLVE ONE TABLET UNDER THE TONGUE AS NEEDED FOR CHEST PAIN**MAX 3 TABLETS EACH 5 MINUTES APART**  . Omega-3 Krill Oil 500 MG CAPS Take 500 mg by mouth daily.   [name] omeprazole (PRILOSEC) 20 MG capsule Take 1 po daily (Patient taking differently: Take 20 mg by mouth 2 (two) times daily. )  . polyethylene glycol powder (GLYCOLAX/MIRALAX) 17 GM/SCOOP powder Take 17 g by mouth 2 (two) times daily. Until daily soft stools  OTC  . rosuvastatin (CRESTOR) 20 MG tablet  Take 20 mg by mouth at bedtime.  . senna (SENOKOT) 8.6 MG TABS tablet Take 2 tablets by mouth daily.  . simvastatin (ZOCOR) 20 MG tablet Take 1 tablet (20 mg total) by mouth at bedtime.   Facility-Administered Encounter Medications as of 01/20/2020  Medication  . sodium chloride flush (NS) 0.9 % injection 10 mL    ALLERGIES:  Allergies  Allergen Reactions  . Penicillins Shortness Of Breath and Other (See Comments)    Has patient had a PCN reaction causing immediate rash, facial/tongue/throat swelling, SOB or lightheadedness with hypotension: Yes Has patient had a PCN reaction causing severe rash involving mucus membranes or skin necrosis: Unk Has patient had a PCN reaction that required hospitalization: Unk Has patient had a PCN reaction occurring within  the last 10 years: Yes "States that she was in ICU after being administered"   . Codeine Nausea And Vomiting  . Lipitor [Atorvastatin] Other (See Comments)    Myalgias  . Lisinopril Cough  . Pravastatin Other (See Comments)    Myalgias     PHYSICAL EXAM:  ECOG Performance status: 1  Vitals:   01/20/20 0857  BP: 134/83  Pulse: 74  Resp: 16  Temp: (!) 97.3 F (36.3 C)  SpO2: 100%   Filed Weights   01/20/20 0857  Weight: 174 lb 3.2 oz (79 kg)    Physical Exam Constitutional:      Appearance: Normal appearance. She is normal weight.  Cardiovascular:     Rate and Rhythm: Normal rate and regular rhythm.     Heart sounds: Normal heart sounds.  Pulmonary:     Effort: Pulmonary effort is normal.     Breath sounds: Normal breath sounds.  Abdominal:     General: Bowel sounds are normal.     Palpations: Abdomen is soft.  Musculoskeletal:        General: Normal range of motion.  Skin:    General: Skin is warm and dry.  Neurological:     Mental Status: She is alert and oriented to person, place, and time. Mental status is at baseline.  Psychiatric:        Mood and Affect: Mood normal.        Behavior: Behavior normal.         Thought Content: Thought content normal.        Judgment: Judgment normal.      LABORATORY DATA:  I have reviewed the labs as listed.  CBC    Component Value Date/Time   WBC 5.3 01/20/2020 0828   RBC 3.69 (L) 01/20/2020 0828   HGB 10.9 (L) 01/20/2020 0828   HCT 34.6 (L) 01/20/2020 0828   PLT 256 01/20/2020 0828   MCV 93.8 01/20/2020 0828   MCH 29.5 01/20/2020 0828   MCHC 31.5 01/20/2020 0828   RDW 17.8 (H) 01/20/2020 0828   LYMPHSABS 2.1 01/20/2020 0828   MONOABS 0.4 01/20/2020 0828   EOSABS 0.1 01/20/2020 0828   BASOSABS 0.0 01/20/2020 0828   CMP Latest Ref Rng & Units 01/20/2020 12/30/2019 12/09/2019  Glucose 70 - 99 mg/dL 77 82 86  BUN 8 - 23 mg/dL _0 Creatinine 0.44 - 1.00 mg/dL 1.02(H) 1.02(H) 1.03(H)  Sodium 135 - 145 mmol/L 137 137 139  Potassium 3.5 - 5.1 mmol/L 3.4(L) 3.7 3.1(L)  Chloride 98 - 111 mmol/L 100 101 98  CO2 22 - 32 mmol/L _1 Calcium 8.9 - 10.3 mg/dL 9.4 9.7 9.2  Total Protein 6.5 - 8.1 g/dL 8.2(H) 8.4(H) 7.9  Total Bilirubin 0.3 - 1.2 mg/dL 0.8 0.9 1.0  Alkaline Phos 38 - 126 U/L 109 111 105  AST 15 - 41 U/L 48(H) 52(H) 59(H)  ALT 0 - 44 U/L 16 19 34       DIAGNOSTIC IMAGING:  I have independently reviewed the scans and discussed with the patient.   ASSESSMENT & PLAN:   Squamous cell carcinoma of lung, stage IV, right ([place]) 1.  Metastatic squamous cell carcinoma of the lung, PD-L1 TPS-10%, foundation 1 with no targetable mutations: -4 cycles of carboplatin, paclitaxel and pembrolizumab from 06/15/2019 through 08/18/2019. -Maintenance pembrolizumab started on 09/08/2019.  Last CT scans were done on 11/15/2019. -I reviewed her CBC which was normal.  LFTs were normal except elevation of  AST of 48. -She will proceed with her next cycle today.  She will come back in 3 weeks for follow-up.  I plan to order repeat scans at that time.  2.  Low back pain: -I have reviewed results of the MRI of the thoracic spine which showed small  amount of anterior epidural tumor at 155.  Metastatic disease in the vertebral body T4, T5, T10 and L1.  Posterior disease at T3, T7 and T10. -She complains of left hip pain which shoots down her left leg.  She has an appointment to see Dr. Ernestina Patches for repeat epidural steroid injection on 01/26/2020. -I have also recommended her to see Dr.Yanagihara for radiation consultation.  At first she was reluctant but agreed to consider it. -I have also recommended her to take Dilaudid half tablet as needed.  She is severely limited by her pain and is also depressed.  3.  Hypothyroidism: -TSH was elevated at 135.  I have repeated it again which showed TSH 117.5.  T4 and T3 were low.  We have referred her to Dr. Dorris Fetch.  Likely related to treatment with immunotherapy.  5.  Trouble swallowing: -She has acid reflux and is on PPI for a long time.  We have made a referral to [facility].  6.  Elevated creatinine: -Her creatinine today is 1.02.  Her baseline is between 0.6 and 0.7.  This could be immunotherapy related.  We will give close watch on it.    Orders placed this encounter:  Orders Placed This Encounter  Procedures  . CBC with Differential/Platelet  . Comprehensive metabolic panel  . Magnesium  . TSH     Derek Jack, [place] [phone]

## 2020-01-20 NOTE — Patient Instructions (Addendum)
[place] at [facility] Discharge Instructions  You were seen today by Dr. Delton Coombes. He went over your recent lab and scan results. He will refer you to Dr. Darreld Mclean for possible radiation to your lower back. Try taking 1/2 tablet of Dilaudid to help with the pain. He will see you back in 3 weeks for labs, treatment and follow up.   Thank you for choosing [place] at [facility] to provide your oncology and hematology care.  To afford each patient quality time with our provider, please arrive at least 15 minutes before your scheduled appointment time.   If you have a lab appointment with the [facility] please come in thru the  Main Entrance and check in at the main information desk  You need to re-schedule your appointment should you arrive 10 or more minutes late.  We strive to give you quality time with our providers, and arriving late affects you and other patients whose appointments are after yours.  Also, if you no show three or more times for appointments you may be dismissed from the clinic at the providers discretion.     Again, thank you for choosing [facility]  Our hope is that these requests will decrease the amount of time that you wait before being seen by our physicians.       _____________________________________________________________  Should you have questions after your visit to [facility], please contact our office at [phone] between the hours of 8:00 a.m. and 4:30 p.m.  Voicemails left after 4:00 p.m. will not be returned until the following business day.  For prescription refill requests, have your pharmacy contact our office and allow 72 hours.    Cancer Center Support Programs:   > Cancer Support Group  2nd Tuesday of the month 1pm-2pm, Journey Room

## 2020-01-20 NOTE — Progress Notes (Unsigned)
Referral sent to [facility].  Records faxed on 3/3

## 2020-01-20 NOTE — Patient Instructions (Signed)
[facility] Discharge Instructions for Patients Receiving Chemotherapy  Today you received the following chemotherapy agents   To help prevent nausea and vomiting after your treatment, we encourage you to take your nausea medication   If you develop nausea and vomiting that is not controlled by your nausea medication, call the clinic.   BELOW ARE SYMPTOMS THAT SHOULD BE REPORTED IMMEDIATELY:  *FEVER GREATER THAN 100.5 F  *CHILLS WITH OR WITHOUT FEVER  NAUSEA AND VOMITING THAT IS NOT CONTROLLED WITH YOUR NAUSEA MEDICATION  *UNUSUAL SHORTNESS OF BREATH  *UNUSUAL BRUISING OR BLEEDING  TENDERNESS IN MOUTH AND THROAT WITH OR WITHOUT PRESENCE OF ULCERS  *URINARY PROBLEMS  *BOWEL PROBLEMS  UNUSUAL RASH Items with * indicate a potential emergency and should be followed up as soon as possible.  Feel free to call the clinic should you have any questions or concerns. The clinic phone number is [phone].  Please show the CHEMO ALERT CARD at check-in to the Emergency Department and triage nurse.   

## 2020-01-23 NOTE — Assessment & Plan Note (Signed)
1.  Metastatic squamous cell carcinoma of the lung, PD-L1 TPS-10%, foundation 1 with no targetable mutations: -4 cycles of carboplatin, paclitaxel and pembrolizumab from 06/15/2019 through 08/18/2019. -Maintenance pembrolizumab started on 09/08/2019.  Last CT scans were done on 11/15/2019. -I reviewed her CBC which was normal.  LFTs were normal except elevation of AST of 48. -She will proceed with her next cycle today.  She will come back in 3 weeks for follow-up.  I plan to order repeat scans at that time.  2.  Low back pain: -I have reviewed results of the MRI of the thoracic spine which showed small amount of anterior epidural tumor at 155.  Metastatic disease in the vertebral body T4, T5, T10 and L1.  Posterior disease at T3, T7 and T10. -She complains of left hip pain which shoots down her left leg.  She has an appointment to see Dr. Ernestina Patches for repeat epidural steroid injection on 01/26/2020. -I have also recommended her to see Dr.Yanagihara for radiation consultation.  At first she was reluctant but agreed to consider it. -I have also recommended her to take Dilaudid half tablet as needed.  She is severely limited by her pain and is also depressed.  3.  Hypothyroidism: -TSH was elevated at 135.  I have repeated it again which showed TSH 117.5.  T4 and T3 were low.  We have referred her to Dr. Dorris Fetch.  Likely related to treatment with immunotherapy.  5.  Trouble swallowing: -She has acid reflux and is on PPI for a long time.  We have made a referral to [facility].  6.  Elevated creatinine: -Her creatinine today is 1.02.  Her baseline is between 0.6 and 0.7.  This could be immunotherapy related.  We will give close watch on it.

## 2020-01-26 ENCOUNTER — Other Ambulatory Visit: Payer: Self-pay

## 2020-01-26 ENCOUNTER — Encounter: Payer: Self-pay | Admitting: "Endocrinology

## 2020-01-26 ENCOUNTER — Ambulatory Visit (INDEPENDENT_AMBULATORY_CARE_PROVIDER_SITE_OTHER): Payer: Medicare PPO | Admitting: "Endocrinology

## 2020-01-26 VITALS — BP 136/64 | HR 76 | Ht 66.0 in | Wt 177.0 lb

## 2020-01-26 DIAGNOSIS — E039 Hypothyroidism, unspecified: Secondary | ICD-10-CM | POA: Insufficient documentation

## 2020-01-26 MED ORDER — LEVOTHYROXINE SODIUM 50 MCG PO TABS: 50.0000 ug | ORAL_TABLET | Freq: Every day | ORAL | 2 refills | Status: DC

## 2020-01-26 NOTE — Progress Notes (Signed)
     Endocrinology Consult Note                                            01/26/2020, 10:07 PM   Subjective:    Patient ID: Lindsey Mullins, female    DOB: 09/30/1946, PCP [place], Kawanta F, MD   Past Medical History:  Diagnosis Date  . Atypical chest pain 05/23/2012   STRESS TEST - small to moderate sized area of partial reversibility of the anteroapical wall, most likely breast artifact, post-stress EF 67%, EKG show NSR at 65, No Lexiscan EKG changes, non-diagnostic for ischemia; STRESS TEST, 01/25/2010 - normal study, post-stress EF 66%, no significant ischemia  . Cancer (HCC)   . Cerebral atherosclerosis 09/29/2012   CAROTID DUPLEX - RIGHT  BULB/PROXIMAL ICA-moderate amount of fibrous plaque, 50-69% diameter reduction; LEFT CEA-normal, no significant diameter reduction  . Colitis   . GERD (gastroesophageal reflux disease)   . Hyperlipidemia   . Hypertension   . Osteopenia   . Restless leg syndrome   . Shortness of breath 03/30/2005   2D ECHO - EF >55%, normal  . TIA (transient ischemic attack) 01/27/2010   2D ECHO - EF 65%, normal   Past Surgical History:  Procedure Laterality Date  . ABDOMINAL EXPLORATION SURGERY    . CAROTID ENDARTERECTOMY    . LYMPH NODE BIOPSY N/A 05/20/2019   Procedure: LYMPH NODE BIOPSY CERVICAL;  Surgeon: Jenkins, Mark, MD;  Location: [facility];  Service: General;  Laterality: N/A;  . Peripheral Vascular Catheterization  07/16/2005   Right verterbral-50% smooth segmental badsilar artery stenosis on right side, Right carotid-50% ulcerated proxmial stenosis, Left carotid-60 to 70% left externam carotid artery stenosis, 90% proximal left ICA stenosis  . PORTACATH PLACEMENT Left 06/12/2019   Procedure: INSERTION PORT-A-CATH (catheter attached left subclavian);  Surgeon: Jenkins, Mark, MD;  Location: [facility];  Service: General;  Laterality: Left;   Social History   Socioeconomic History  . Marital status: Divorced    Spouse name: Not on file  . Number of  children: 2  . Years of education: Not on file  . Highest education level: Not on file  Occupational History  . Not on file  Tobacco Use  . Smoking status: Former Smoker    Packs/day: 0.25    Years: 10.00    Pack years: 2.50    Types: Cigarettes    Quit date: 04/21/1983    Years since quitting: 36.7  . Smokeless tobacco: Never Used  Substance and Sexual Activity  . Alcohol use: No  . Drug use: No  . Sexual activity: Not Currently  Other Topics Concern  . Not on file  Social History Narrative  . Not on file   Social Determinants of Health   Financial Resource Strain: Low Risk   . Difficulty of Paying Living Expenses: Not hard at all  Food Insecurity: No Food Insecurity  . Worried About Running Out of Food in the Last Year: Never true  . Ran Out of Food in the Last Year: Never true  Transportation Needs: No Transportation Needs  . Lack of Transportation (Medical): No  . Lack of Transportation (Non-Medical): No  Physical Activity: Inactive  . Days of Exercise per Week: 0 days  . Minutes of Exercise per Session: 0 min  Stress: No Stress Concern Present  . Feeling of Stress : Not at   all  Social Connections: Somewhat Isolated  . Frequency of Communication with Friends and Family: Twice a week  . Frequency of Social Gatherings with Friends and Family: Twice a week  . Attends Religious Services: More than 4 times per year  . Active Member of Clubs or Organizations: No  . Attends Club or Organization Meetings: Never  . Marital Status: Divorced   Family History  Problem Relation Age of Onset  . Heart disease Mother   . Hypertension Mother   . Hypertension Father   . Heart disease Father    Outpatient Encounter Medications as of 01/26/2020  Medication Sig  . gabapentin (NEURONTIN) 100 MG capsule Take 2 capsules (200 mg total) by mouth at bedtime.  . levothyroxine (SYNTHROID) 50 MCG tablet Take 1 tablet (50 mcg total) by mouth daily before breakfast.  . losartan (COZAAR) 50  MG tablet Take 1/2 (one-half) tablet by mouth once daily (Patient taking differently: Take 25 mg by mouth daily. )  . naproxen sodium (ALEVE) 220 MG tablet Take 220 mg by mouth 2 (two) times daily as needed (pain).   . nitroGLYCERIN (NITROSTAT) 0.4 MG SL tablet DISSOLVE ONE TABLET UNDER THE TONGUE AS NEEDED FOR CHEST PAIN**MAX 3 TABLETS EACH 5 MINUTES APART**  . Omega-3 Krill Oil 500 MG CAPS Take 500 mg by mouth daily.   . omeprazole (PRILOSEC) 20 MG capsule Take 1 po daily (Patient taking differently: Take 20 mg by mouth 2 (two) times daily. )  . polyethylene glycol powder (GLYCOLAX/MIRALAX) 17 GM/SCOOP powder Take 17 g by mouth 2 (two) times daily. Until daily soft stools  OTC  . rosuvastatin (CRESTOR) 20 MG tablet Take 20 mg by mouth at bedtime.  . senna (SENOKOT) 8.6 MG TABS tablet Take 2 tablets by mouth daily.  . [DISCONTINUED] aspirin 81 MG tablet Take 81 mg by mouth at bedtime.   . [DISCONTINUED] loratadine (CLARITIN) 10 MG tablet Take 10 mg by mouth daily as needed for allergies or itching.   . [DISCONTINUED] simvastatin (ZOCOR) 20 MG tablet Take 1 tablet (20 mg total) by mouth at bedtime.   Facility-Administered Encounter Medications as of 01/26/2020  Medication  . sodium chloride flush (NS) 0.9 % injection 10 mL   ALLERGIES: Allergies  Allergen Reactions  . Penicillins Shortness Of Breath and Other (See Comments)    Has patient had a PCN reaction causing immediate rash, facial/tongue/throat swelling, SOB or lightheadedness with hypotension: Yes Has patient had a PCN reaction causing severe rash involving mucus membranes or skin necrosis: Unk Has patient had a PCN reaction that required hospitalization: Unk Has patient had a PCN reaction occurring within the last 10 years: Yes "States that she was in ICU after being administered"   . Codeine Nausea And Vomiting  . Lipitor [Atorvastatin] Other (See Comments)    Myalgias  . Lisinopril Cough  . Pravastatin Other (See Comments)     Myalgias    VACCINATION STATUS: Immunization History  Administered Date(s) Administered  . Fluad Quad(high Dose 65+) 09/11/2019  . Influenza,inj,Quad PF,6+ Mos 11/30/2013, 11/17/2014, 10/26/2015, 09/14/2016, 07/24/2018  . Moderna SARS-COVID-2 Vaccination 01/02/2020  . PPD Test 08/21/2013  . Pneumococcal Conjugate-13 11/17/2014  . Pneumococcal Polysaccharide-23 08/21/2013  . Tdap 08/21/2013  . Zoster Recombinat (Shingrix) 04/10/2017, 04/17/2018    HPI Lindsey Mullins is 73 y.o. female who presents today with a medical history as above. she is being seen in consultation for for hypothyroidism requested by [place], Kawanta F, MD.  History is obtained mainly review of   her medical records.  Over the last year she has been treated for Metastatic squamous cell carcinoma of the lung, PD-L1 TPS-10%, foundation 1 with no targetable mutations: -4 cycles of carboplatin, paclitaxel and pembrolizumab from 06/15/2019 through 08/18/2019. -Maintenance pembrolizumab started on 09/08/2019.  She was initially found to have suppressed TSH in October, November 2020.  More recently in February and March her TSH has increased significantly , see lab summary below. She complains of fatigue, cold intolerance, weight gain.  She is not currently on antithyroid medications nor thyroid hormone supplements.  She denies family history of thyroid dysfunction.  Her most recent labs on December 30, 2019 showed TSH of 117, total T4 <0.4.  Review of Systems  Constitutional: +  weight gain, + fatigue,+ subjective hypothermia Eyes: no blurry vision, no xerophthalmia ENT: no sore throat, no nodules palpated in throat, no dysphagia/odynophagia, no hoarseness Cardiovascular: no Chest Pain, no Shortness of Breath, no palpitations, no leg swelling Respiratory: no cough, no shortness of breath Gastrointestinal: no Nausea/Vomiting/Diarhhea Musculoskeletal: Wheelchair-bound due to deconditioning, no muscle/joint aches Skin: no  rashes Neurological: no tremors, no numbness, no tingling, no dizziness Psychiatric: no depression, no anxiety  Objective:    Vitals with BMI 01/26/2020 01/20/2020 01/20/2020  Height 5' 6" - -  Weight 177 lbs - 174 lbs 3 oz  BMI 28.58 - -  Systolic 136 143 134  Diastolic 64 86 83  Pulse 76 71 74    BP 136/64   Pulse 76   Ht 5' 6" (1.676 m)   Wt 177 lb (80.3 kg)   BMI 28.57 kg/m   Wt Readings from Last 3 Encounters:  01/26/20 177 lb (80.3 kg)  01/20/20 174 lb 3.2 oz (79 kg)  12/30/19 174 lb 6.4 oz (79.1 kg)    Physical Exam  Constitutional:  Body mass index is 28.57 kg/m.,  not in acute distress, normal state of mind, + rebound due to deconditioning. Eyes: PERRLA, EOMI, no exophthalmos ENT: moist mucous membranes, no gross thyromegaly, no gross cervical lymphadenopathy Cardiovascular: normal precordial activity, Regular Rate and Rhythm, no Murmur/Rubs/Gallops Respiratory:  adequate breathing efforts, no gross chest deformity, Clear to auscultation bilaterally Gastrointestinal: abdomen soft, Non -tender, No distension, Bowel Sounds present, no gross organomegaly Musculoskeletal: no gross deformities, strength intact in all four extremities Skin: moist, warm, no rashes Neurological: no tremor with outstretched hands, Deep tendon reflexes normal in bilateral lower extremities.  CMP ( most recent) CMP     Component Value Date/Time   NA 137 01/20/2020 0828   K 3.4 (L) 01/20/2020 0828   CL 100 01/20/2020 0828   CO2 25 01/20/2020 0828   GLUCOSE 77 01/20/2020 0828   BUN 9 01/20/2020 0828   CREATININE 1.02 (H) 01/20/2020 0828   CREATININE 0.83 12/31/2018 1127   CALCIUM 9.4 01/20/2020 0828   PROT 8.2 (H) 01/20/2020 0828   ALBUMIN 4.2 01/20/2020 0828   AST 48 (H) 01/20/2020 0828   ALT 16 01/20/2020 0828   ALKPHOS 109 01/20/2020 0828   BILITOT 0.8 01/20/2020 0828   GFRNONAA 55 (L) 01/20/2020 0828   GFRAA >60 01/20/2020 0828     Diabetic Labs (most recent): Lab Results   Component Value Date   HGBA1C 5.7 (H) 09/14/2016   HGBA1C 6.3 (H) 01/11/2016     Lipid Panel ( most recent) Lipid Panel     Component Value Date/Time   CHOL 185 07/24/2019 1050   TRIG 123 07/24/2019 1050   HDL 36 (L) 07/24/2019 1050   CHOLHDL   5.1 (H) 07/24/2019 1050   VLDL 35 (H) 02/12/2017 0924   LDLCALC 126 (H) 07/24/2019 1050       Results for CORIN, FORMISANO (MRN [id number]) as of 01/26/2020 22:12  Ref. Range 10/07/2019 08:38 12/30/2019 08:49 12/30/2019 10:55 12/30/2019 10:56  TSH Latest Ref Range: 0.350 - 4.500 uIU/mL 0.096 (L) 135.361 (H)  117.540 (H)  Triiodothyronine,Free,Serum Latest Ref Range: 2.0 - 4.4 pg/mL   0.5 (L)   Thyroxine (T4) Latest Ref Range: 4.5 - 12.0 ug/dL    <0.4 (L)          Assessment & Plan:   1. Hypothyroidism, unspecified type  - AFUA HOOTS  is being seen at a kind request of [place], Modena Nunnery, MD. - I have reviewed her available thyroid records and clinically evaluated the patient. - Based on these reviews, she has profound, primary hypothyroidism likely induced by her immunotherapeutic agents. She will benefit from early initiation of thyroid hormone supplement.  I discussed initiated levothyroxine 50 mcg p.o. daily before breakfast.   - We discussed about the correct intake of her thyroid hormone, on empty stomach at fasting, with water, separated by at least 30 minutes from breakfast and other medications,  and separated by more than 4 hours from calcium, iron, multivitamins, acid reflux medications (PPIs). -Patient is made aware of the fact that thyroid hormone replacement is needed for life, dose to be adjusted by periodic monitoring of thyroid function tests.  She will have antithyroid antibodies along with thyroid function test before her next visit. - she is advised to maintain close follow up with [name], MD for primary care needs.   - Time spent with the patient: 45 minutes, of which >50% was spent in  counseling her  about her primary hypothyroidism and the rest in obtaining information about her symptoms, reviewing her previous labs/studies ( including abstractions from other facilities),  evaluations, and treatments,  and developing a plan to confirm diagnosis and long term treatment based on the latest standards of care/guidelines; and documenting her care.  Renita Papa participated in the discussions, expressed understanding, and voiced agreement with the above plans.  All questions were answered to her satisfaction. she is encouraged to contact clinic should she have any questions or concerns prior to her return visit.  Follow up plan: Return in about 9 weeks (around 03/29/2020) for Follow up with Pre-visit Labs.   Glade Lloyd, MD [facility] [address] [place], [place] [address] Phone: [phone]  Fax: [phone]     01/26/2020, 10:07 PM  This note was partially dictated with voice recognition software. Similar sounding words can be transcribed inadequately or may not  be corrected upon review.

## 2020-01-27 ENCOUNTER — Ambulatory Visit: Payer: Medicare PPO | Admitting: Physical Medicine and Rehabilitation

## 2020-01-27 ENCOUNTER — Ambulatory Visit: Payer: Self-pay

## 2020-01-27 VITALS — BP 135/89 | HR 75

## 2020-01-27 DIAGNOSIS — M5416 Radiculopathy, lumbar region: Secondary | ICD-10-CM | POA: Diagnosis not present

## 2020-01-27 DIAGNOSIS — M4316 Spondylolisthesis, lumbar region: Secondary | ICD-10-CM

## 2020-01-27 MED ORDER — METHYLPREDNISOLONE ACETATE 80 MG/ML IJ SUSP
80.0000 mg | Freq: Once | INTRAMUSCULAR | Status: AC
  Administered 2020-01-27: 80 mg

## 2020-01-27 NOTE — Progress Notes (Signed)
Numeric Pain Rating Scale and Functional Assessment Average Pain 9-10   In the last MONTH (on 0-10 scale) has pain interfered with the following?  1. General activity like being  able to carry out your everyday physical activities such as walking, climbing stairs, carrying groceries, or moving a chair?  Rating(9)   +Driver, -BT, -Dye Allergies.

## 2020-01-28 NOTE — Procedures (Signed)
Lumbosacral Transforaminal Epidural Steroid Injection - Sub-Pedicular Approach with Fluoroscopic Guidance  Patient: Lindsey Mullins      Date of Birth: [birth year] MRN: [id number] PCP: [name], MD      Visit Date: 01/27/2020   Universal Protocol:    Date/Time: 01/27/2020  Consent Given By: the patient  Position: PRONE  Additional Comments: Vital signs were monitored before and after the procedure. Patient was prepped and draped in the usual sterile fashion. The correct patient, procedure, and site was verified.   Injection Procedure Details:  Procedure Site One Meds Administered:  Meds ordered this encounter  Medications  . methylPREDNISolone acetate (DEPO-MEDROL) injection 80 mg    Laterality: Left  Location/Site:  L4-L5  Needle size: 22 G  Needle type: Spinal  Needle Placement: Transforaminal  Findings:    -Comments: Excellent flow of contrast along the nerve and into the epidural space.  Procedure Details: After squaring off the end-plates to get a true AP view, the C-arm was positioned so that an oblique view of the foramen as noted above was visualized. The target area is just inferior to the "nose of the scotty dog" or sub pedicular. The soft tissues overlying this structure were infiltrated with 2-3 ml. of 1% Lidocaine without Epinephrine.  The spinal needle was inserted toward the target using a "trajectory" view along the fluoroscope beam.  Under AP and lateral visualization, the needle was advanced so it did not puncture dura and was located close the 6 O'Clock position of the pedical in AP tracterory. Biplanar projections were used to confirm position. Aspiration was confirmed to be negative for CSF and/or blood. A 1-2 ml. volume of Isovue-250 was injected and flow of contrast was noted at each level. Radiographs were obtained for documentation purposes.   After attaining the desired flow of contrast documented above, a 0.5 to 1.0 ml test dose of  0.25% Marcaine was injected into each respective transforaminal space.  The patient was observed for 90 seconds post injection.  After no sensory deficits were reported, and normal lower extremity motor function was noted,   the above injectate was administered so that equal amounts of the injectate were placed at each foramen (level) into the transforaminal epidural space.   Additional Comments:  The patient tolerated the procedure well Dressing: 2 x 2 sterile gauze and Band-Aid    Post-procedure details: Patient was observed during the procedure. Post-procedure instructions were reviewed.  Patient left the clinic in stable condition.

## 2020-01-28 NOTE — Progress Notes (Signed)
Lindsey Mullins - 74 y.o. female MRN [id number]  Date of birth: 05/17/1946  Office Visit Note: Visit Date: 01/27/2020 PCP: [name], MD Referred by: [name], MD  Subjective: Chief Complaint  Patient presents with  . Lower Back - Pain   HPI: Lindsey Mullins is a 74 y.o. female who comes in today For interventional spine procedure as requested by Derek Jack, MD at the [facility].  Patient is well-known to me that I have seen her over the last several years mainly with severe facet arthropathy at L4-5 typically with chronic back pain.  She has been having left radicular type leg pain now with referral from the hip anterior laterally to the knee without anything past the knee.  Worse with standing and ambulating.  MRI of the lumbar spine was performed once again by Dr. Delton Coombes in unfortunately this shows metastatic tumor burden throughout the lumbar spine and vertebral bodies with compression fracture but no retropulsion or canal stenosis.  There is no tumor in the canal or tumor causing nerve compression.  L4-5 there is worsening facet arthritis still grade 1 listhesis.  On the left there is severe arthritis with foraminal narrowing I think this is what is causing her symptoms at this point.  We are going to complete diagnostic and hopefully therapeutic left L4 transforaminal injection.  Depending on relief of her back pain would consider facet joint ablation at L4-5.  ROS Otherwise per HPI.  Assessment & Plan: Visit Diagnoses:  1. Lumbar radiculopathy   2. Spondylolisthesis of lumbar region     Plan: No additional findings.   Meds & Orders:  Meds ordered this encounter  Medications  . methylPREDNISolone acetate (DEPO-MEDROL) injection 80 mg    Orders Placed This Encounter  Procedures  . XR C-ARM NO REPORT  . Epidural Steroid injection    Follow-up: Return if symptoms worsen or fail to improve.   Procedures: No procedures performed    Lumbosacral Transforaminal Epidural Steroid Injection - Sub-Pedicular Approach with Fluoroscopic Guidance  Patient: Lindsey Mullins      Date of Birth: Oct 06, 1946 MRN: [id number] PCP: [name], MD      Visit Date: 01/27/2020   Universal Protocol:    Date/Time: 01/27/2020  Consent Given By: the patient  Position: PRONE  Additional Comments: Vital signs were monitored before and after the procedure. Patient was prepped and draped in the usual sterile fashion. The correct patient, procedure, and site was verified.   Injection Procedure Details:  Procedure Site One Meds Administered:  Meds ordered this encounter  Medications  . methylPREDNISolone acetate (DEPO-MEDROL) injection 80 mg    Laterality: Left  Location/Site:  L4-L5  Needle size: 22 G  Needle type: Spinal  Needle Placement: Transforaminal  Findings:    -Comments: Excellent flow of contrast along the nerve and into the epidural space.  Procedure Details: After squaring off the end-plates to get a true AP view, the C-arm was positioned so that an oblique view of the foramen as noted above was visualized. The target area is just inferior to the "nose of the scotty dog" or sub pedicular. The soft tissues overlying this structure were infiltrated with 2-3 ml. of 1% Lidocaine without Epinephrine.  The spinal needle was inserted toward the target using a "trajectory" view along the fluoroscope beam.  Under AP and lateral visualization, the needle was advanced so it did not puncture dura and was located close the 6 O'Clock position of  the pedical in AP tracterory. Biplanar projections were used to confirm position. Aspiration was confirmed to be negative for CSF and/or blood. A 1-2 ml. volume of Isovue-250 was injected and flow of contrast was noted at each level. Radiographs were obtained for documentation purposes.   After attaining the desired flow of contrast documented above, a 0.5 to 1.0 ml test dose of  0.25% Marcaine was injected into each respective transforaminal space.  The patient was observed for 90 seconds post injection.  After no sensory deficits were reported, and normal lower extremity motor function was noted,   the above injectate was administered so that equal amounts of the injectate were placed at each foramen (level) into the transforaminal epidural space.   Additional Comments:  The patient tolerated the procedure well Dressing: 2 x 2 sterile gauze and Band-Aid    Post-procedure details: Patient was observed during the procedure. Post-procedure instructions were reviewed.  Patient left the clinic in stable condition.     Clinical History: MRI LUMBAR SPINE WITHOUT AND WITH CONTRAST  TECHNIQUE: Multiplanar and multiecho pulse sequences of the lumbar spine were obtained without and with intravenous contrast.  CONTRAST:  7.4mL GADAVIST GADOBUTROL 1 MMOL/ML IV SOLN  COMPARISON:  CT scan of the abdomen and pelvis dated 10/26/2019 and lumbar MRI dated 04/15/2019  FINDINGS: Segmentation:  Standard.  Alignment: 4 mm spondylolisthesis of L4 on L5. Alignment is otherwise normal.  Vertebrae: Progressive bone metastases throughout the lumbar spine as well as in the sacrum. New metastases in the T12 vertebral body.  Conus medullaris and cauda equina: Conus extends to the L1 level. Conus and cauda equina appear normal.  Paraspinal and other soft tissues: Negative.  Disc levels:  L1-2: There is a mild pathologic fracture of the superior endplate of L2, unchanged since the prior CT scan. Tiny bulges of the L1-2 disc to the right and left of midline with no neural impingement. Metastases involve much of the L1 and L2 vertebra. No extension of tumor into the spinal canal.  L2-3: Small broad-based disc bulge. Mild pathologic fracture of the inferior endplate of L2. No extension of tumor into the spinal canal. Tumor extends throughout the L2 and L3  vertebra.  L3-4: Pathologic fracture of the inferior endplate of L3. Small broad-based disc bulge asymmetric into the right neural foramen without neural impingement. No tumor extension into the spinal canal.  L4-5: Progressive metastatic disease in the L4 and L5 vertebral bodies. 4 mm spondylolisthesis due to severe bilateral facet arthritis. Slight hypertrophy of the ligamentum flavum. No spinal or foraminal stenosis. No tumor in the spinal canal. Effusion of the left facet joint, new since the prior study.  L5-S1: Progressive tumor in L5 and S1. Tiny broad-based bulge of the disc with no neural impingement. No tumor in the spinal canal. No foraminal stenosis.  IMPRESSION: 1. Progressive metastatic disease throughout the lumbar spine as well as in the sacrum. New metastases in T12. 2. Pathologic fractures of L2 and L3, unchanged since the prior CT scan of the abdomen. 3. No neural impingement in the lumbar spine. No tumor seen in the spinal canal. 4. Progressive severe bilateral facet arthritis at L4-5.   Electronically Signed   By: Lorriane Shire M.D.   On: 12/16/2019 10:05   She reports that she quit smoking about 36 years ago. Her smoking use included cigarettes. She has a 2.50 pack-year smoking history. She has never used smokeless tobacco. No results for input(s): HGBA1C, LABURIC in the last 8760 hours.  Objective:  VS:  HT:    WT:   BMI:     BP:135/89  HR:75bpm  TEMP: ( )  RESP:  Physical Exam Constitutional:      General: She is not in acute distress.    Appearance: Normal appearance. She is not ill-appearing.  HENT:     Head: Normocephalic and atraumatic.     Right Ear: External ear normal.     Left Ear: External ear normal.  Eyes:     Extraocular Movements: Extraocular movements intact.  Cardiovascular:     Rate and Rhythm: Normal rate.     Pulses: Normal pulses.  Musculoskeletal:     Right lower leg: No edema.     Left lower leg: No edema.      Comments: Patient has good distal strength.  She is in wheelchair Duda pain with standing but she can stand and ambulate.  She has pain over the left greater trochanter that is somewhat concordant.  No pain with hip rotation.  Skin:    Findings: No erythema, lesion or rash.  Neurological:     General: No focal deficit present.     Mental Status: She is alert and oriented to person, place, and time.     Sensory: No sensory deficit.     Motor: No weakness or abnormal muscle tone.     Coordination: Coordination normal.  Psychiatric:        Mood and Affect: Mood normal.        Behavior: Behavior normal.     Ortho Exam Imaging: XR C-ARM NO REPORT  Result Date: 01/27/2020 Please see Notes tab for imaging impression.   Past Medical/Family/Surgical/Social History: Medications & Allergies reviewed per EMR, new medications updated. Patient Active Problem List   Diagnosis Date Noted  . Hypothyroidism 01/26/2020  . Peripheral edema 09/11/2019  . Malignant neoplasm of right lung ([place])   . Goals of care, counseling/discussion 06/10/2019  . Squamous cell carcinoma of lung, stage IV, right ([place]) 05/28/2019  . Metastatic cancer ([place]) 05/26/2019  . Cervical lymphadenopathy   . Compression fracture of L2 ([place]) 04/29/2019  . Arteriosclerosis, mesenteric artery ([place]) 12/31/2018  . Abdominal pain 12/22/2018  . Colitis 12/22/2018  . Osteopenia   . DDD (degenerative disc disease), lumbar 05/11/2016  . Obesity 05/11/2016  . Glucose intolerance (impaired glucose tolerance) 01/12/2016  . Pain in the chest   . Chest pain 01/10/2016  . GERD (gastroesophageal reflux disease) 01/26/2015  . Stress and adjustment reaction 01/26/2015  . Dysuria 11/30/2013  . Carotid artery disease ([place]) 04/20/2013  . Bunion, left 09/21/2012  . Overweight(278.02) 06/04/2012  . Essential hypertension, benign 04/02/2012  . Hyperlipidemia 04/02/2012  . Dysphagia 04/02/2012  . RLS (restless legs syndrome) 04/02/2012    Past Medical History:  Diagnosis Date  . Atypical chest pain 05/23/2012   STRESS TEST - small to moderate sized area of partial reversibility of the anteroapical wall, most likely breast artifact, post-stress EF 67%, EKG show NSR at 65, No Lexiscan EKG changes, non-diagnostic for ischemia; STRESS TEST, 01/25/2010 - normal study, post-stress EF 66%, no significant ischemia  . Cancer ([place])   . Cerebral atherosclerosis 09/29/2012   CAROTID DUPLEX - RIGHT  BULB/PROXIMAL ICA-moderate amount of fibrous plaque, 50-69% diameter reduction; LEFT CEA-normal, no significant diameter reduction  . Colitis   . GERD (gastroesophageal reflux disease)   . Hyperlipidemia   . Hypertension   . Osteopenia   . Restless leg syndrome   . Shortness of breath 03/30/2005  2D ECHO - EF >55%, normal  . TIA (transient ischemic attack) 01/27/2010   2D ECHO - EF 65%, normal   Family History  Problem Relation Age of Onset  . Heart disease Mother   . Hypertension Mother   . Hypertension Father   . Heart disease Father    Past Surgical History:  Procedure Laterality Date  . ABDOMINAL EXPLORATION SURGERY    . CAROTID ENDARTERECTOMY    . LYMPH NODE BIOPSY N/A 05/20/2019   Procedure: LYMPH NODE BIOPSY CERVICAL;  Surgeon: Aviva Signs, MD;  Location: [facility];  Service: General;  Laterality: N/A;  . Peripheral Vascular Catheterization  07/16/2005   Right verterbral-50% smooth segmental badsilar artery stenosis on right side, Right carotid-50% ulcerated proxmial stenosis, Left carotid-60 to 70% left externam carotid artery stenosis, 90% proximal left ICA stenosis  . PORTACATH PLACEMENT Left 06/12/2019   Procedure: INSERTION PORT-A-CATH (catheter attached left subclavian);  Surgeon: Aviva Signs, MD;  Location: [facility];  Service: General;  Laterality: Left;   Social History   Occupational History  . Not on file  Tobacco Use  . Smoking status: Former Smoker    Packs/day: 0.25    Years: 10.00    Pack years: 2.50     Types: Cigarettes    Quit date: 04/21/1983    Years since quitting: 36.7  . Smokeless tobacco: Never Used  Substance and Sexual Activity  . Alcohol use: No  . Drug use: No  . Sexual activity: Not Currently

## 2020-01-30 ENCOUNTER — Ambulatory Visit: Payer: Medicare PPO | Attending: Internal Medicine

## 2020-01-30 DIAGNOSIS — Z23 Encounter for immunization: Secondary | ICD-10-CM

## 2020-01-30 NOTE — Progress Notes (Signed)
   Covid-19 Vaccination Clinic  Name:  ADRINNE SZE    MRN: [id number] DOB: 03/24/1946  01/30/2020  Ms. [name] was observed post Covid-19 immunization for 15 minutes without incident. She was provided with Vaccine Information Sheet and instruction to access the V-Safe system.   Ms. [name] was instructed to call 911 with any severe reactions post vaccine: [name] Difficulty breathing  . Swelling of face and throat  . A fast heartbeat  . A bad rash all over body  . Dizziness and weakness   Immunizations Administered    Name Date Dose VIS Date Route   Moderna COVID-19 Vaccine 01/30/2020 10:10 AM 0.5 mL 10/20/2019 Intramuscular   Manufacturer: Moderna   Lot: [id number]   [place]: 33825-053-97

## 2020-02-01 DIAGNOSIS — C3431 Malignant neoplasm of lower lobe, right bronchus or lung: Secondary | ICD-10-CM | POA: Diagnosis not present

## 2020-02-01 DIAGNOSIS — G893 Neoplasm related pain (acute) (chronic): Secondary | ICD-10-CM | POA: Diagnosis not present

## 2020-02-01 DIAGNOSIS — Z510 Encounter for antineoplastic radiation therapy: Secondary | ICD-10-CM | POA: Diagnosis not present

## 2020-02-01 DIAGNOSIS — C7951 Secondary malignant neoplasm of bone: Secondary | ICD-10-CM | POA: Diagnosis not present

## 2020-02-08 DIAGNOSIS — C7951 Secondary malignant neoplasm of bone: Secondary | ICD-10-CM | POA: Diagnosis not present

## 2020-02-08 DIAGNOSIS — C3431 Malignant neoplasm of lower lobe, right bronchus or lung: Secondary | ICD-10-CM | POA: Diagnosis not present

## 2020-02-08 DIAGNOSIS — Z510 Encounter for antineoplastic radiation therapy: Secondary | ICD-10-CM | POA: Diagnosis not present

## 2020-02-08 DIAGNOSIS — G893 Neoplasm related pain (acute) (chronic): Secondary | ICD-10-CM | POA: Diagnosis not present

## 2020-02-09 ENCOUNTER — Ambulatory Visit (INDEPENDENT_AMBULATORY_CARE_PROVIDER_SITE_OTHER): Payer: Medicare PPO | Admitting: Family Medicine

## 2020-02-09 ENCOUNTER — Encounter: Payer: Self-pay | Admitting: Family Medicine

## 2020-02-09 ENCOUNTER — Other Ambulatory Visit: Payer: Self-pay

## 2020-02-09 VITALS — BP 118/64 | HR 74 | Temp 98.7°F | Resp 14

## 2020-02-09 DIAGNOSIS — C7800 Secondary malignant neoplasm of unspecified lung: Secondary | ICD-10-CM | POA: Diagnosis not present

## 2020-02-09 DIAGNOSIS — G893 Neoplasm related pain (acute) (chronic): Secondary | ICD-10-CM | POA: Diagnosis not present

## 2020-02-09 DIAGNOSIS — C7951 Secondary malignant neoplasm of bone: Secondary | ICD-10-CM | POA: Diagnosis not present

## 2020-02-09 DIAGNOSIS — C3431 Malignant neoplasm of lower lobe, right bronchus or lung: Secondary | ICD-10-CM | POA: Diagnosis not present

## 2020-02-09 DIAGNOSIS — R1310 Dysphagia, unspecified: Secondary | ICD-10-CM | POA: Diagnosis not present

## 2020-02-09 DIAGNOSIS — C3491 Malignant neoplasm of unspecified part of right bronchus or lung: Secondary | ICD-10-CM | POA: Diagnosis not present

## 2020-02-09 DIAGNOSIS — Z510 Encounter for antineoplastic radiation therapy: Secondary | ICD-10-CM | POA: Diagnosis not present

## 2020-02-09 NOTE — Progress Notes (Signed)
Subjective:    Patient ID: Lindsey Mullins, female    DOB: 10-08-1946, 74 y.o.   MRN: [id number]  Patient presents for Swallowing Difficulty (feels like food is getting stuck in throat and sometimes she has to spit it out- no issues wit thin liquids ) Patient here today with her daughter. Patient here due to difficulty swallowing.  She is followed by [facility] oncology in [facility] secondary to squamous cell carcinoma of the lung with mets to her spine impairing her functional status.  She already has underlying degenerative disc disease and facet arthritis which is severe.  Based on notation from last week they are planning for radiation therapy for palliative reasons.  Radiation to start this Thursday   Today she is having more difficulty swallowing.  This was discussed with her general oncologist as well as she had been experiencing dysphagia for quite some time.  She was referred to GI for further work-up her appointment is not until June.  She does have underlying GERD.  She is currently on PPI with no improvement in symptoms.  She has dysphagia with solid foods.  She is now eating very soft foods or things that may slide down easily such as oatmeal noodles applesauce consistency.  She is able to drink liquids.  If she does eat something such as bread or meat that gets stuck she has to drink a lot of water behind it in order to get the food to move through.  Sometimes she will actually vomit up the solid food other times she coughs until it moves through.  She is also noted to have hypothyroidism her TSH was significantly elevated.  She was referred to endocrinology Dr. Dorris Fetch she had a visit a couple weeks ago when she was started on levothyroxine 50 mcg once a day.  Unable to weigh today she sitting in wheelchair very weak because of severe back pain.  Review Of Systems:  GEN- denies fatigue, fever, weight loss,+weakness, recent illness HEENT- denies eye drainage, change in  vision, nasal discharge, CVS- denies chest pain, palpitations RESP- denies SOB, cough, wheeze ABD- denies N/V, change in stools, abd pain GU- denies dysuria, hematuria, dribbling, incontinence MSK-+joint pain, muscle aches, injury Neuro- denies headache, dizziness, syncope, seizure activity       Objective:    BP 118/64   Pulse 74   Temp 98.7 F (37.1 C) (Temporal)   Resp 14   SpO2 98%  GEN- NAD, alert and oriented x3, sitting in wheelchair HEENT- PERRL, EOMI, non injected sclera, pink conjunctiva, Neck- Supple, no thyromegaly CVS- RRR, no murmur RESP-CTAB ABD-NABS,soft,NT,ND EXT- No edema Pulses- Radial2+        Assessment & Plan:      Problem List Items Addressed This Visit      Unprioritized   Dysphagia - Primary    Progressive dysphagia with history of reflux.  There is no noted mets to the esophagus region however she is symptomatic.  Concern is that she would not be enough calories and will lose significant amount of weight while they are trying to do palliative care radiation and treatments for her metastatic cancer.  She would like to be evaluated by gastroenterology and is willing to proceed with EGD.  We will refer her to gastroenterology.  In the meantime they will stick with a soft food consistency which she is able to tolerate in the liquids.  We did discuss protein supplements she does not like Ensure or boost but they  will try the fruit flavored versions which she does tolerate.      Relevant Orders   Ambulatory referral to Gastroenterology   Metastatic cancer [facility])    Metastatic lesions to her spine/hips.  She will start radiation therapy this Thursday.      Relevant Orders   Ambulatory referral to Gastroenterology   Squamous cell carcinoma of lung, stage IV, right [facility])   Relevant Orders   Ambulatory referral to Gastroenterology      Note: This dictation was prepared with Dragon dictation along with smaller phrase technology. Any transcriptional  errors that result from this process are unintentional.

## 2020-02-09 NOTE — Assessment & Plan Note (Signed)
Metastatic lesions to her spine/hips.  She will start radiation therapy this Thursday.

## 2020-02-09 NOTE — Patient Instructions (Signed)
Referral to GI  F/U pending results

## 2020-02-09 NOTE — Assessment & Plan Note (Signed)
Progressive dysphagia with history of reflux.  There is no noted mets to the esophagus region however she is symptomatic.  Concern is that she would not be enough calories and will lose significant amount of weight while they are trying to do palliative care radiation and treatments for her metastatic cancer.  She would like to be evaluated by gastroenterology and is willing to proceed with EGD.  We will refer her to gastroenterology.  In the meantime they will stick with a soft food consistency which she is able to tolerate in the liquids.  We did discuss protein supplements she does not like Ensure or boost but they will try the fruit flavored versions which she does tolerate.

## 2020-02-10 ENCOUNTER — Inpatient Hospital Stay (HOSPITAL_COMMUNITY): Payer: Medicare PPO

## 2020-02-10 ENCOUNTER — Inpatient Hospital Stay (HOSPITAL_COMMUNITY): Payer: Medicare PPO | Admitting: Hematology

## 2020-02-10 VITALS — BP 138/76 | HR 73 | Temp 97.5°F | Resp 18

## 2020-02-10 VITALS — BP 136/75 | HR 75 | Temp 97.5°F | Resp 18 | Wt 169.4 lb

## 2020-02-10 DIAGNOSIS — G479 Sleep disorder, unspecified: Secondary | ICD-10-CM | POA: Diagnosis not present

## 2020-02-10 DIAGNOSIS — R1310 Dysphagia, unspecified: Secondary | ICD-10-CM | POA: Diagnosis not present

## 2020-02-10 DIAGNOSIS — M545 Low back pain: Secondary | ICD-10-CM | POA: Diagnosis not present

## 2020-02-10 DIAGNOSIS — K219 Gastro-esophageal reflux disease without esophagitis: Secondary | ICD-10-CM | POA: Diagnosis not present

## 2020-02-10 DIAGNOSIS — C7800 Secondary malignant neoplasm of unspecified lung: Secondary | ICD-10-CM | POA: Diagnosis not present

## 2020-02-10 DIAGNOSIS — C3491 Malignant neoplasm of unspecified part of right bronchus or lung: Secondary | ICD-10-CM

## 2020-02-10 DIAGNOSIS — K5900 Constipation, unspecified: Secondary | ICD-10-CM | POA: Diagnosis not present

## 2020-02-10 DIAGNOSIS — Z5112 Encounter for antineoplastic immunotherapy: Secondary | ICD-10-CM | POA: Diagnosis not present

## 2020-02-10 DIAGNOSIS — R944 Abnormal results of kidney function studies: Secondary | ICD-10-CM | POA: Diagnosis not present

## 2020-02-10 DIAGNOSIS — E039 Hypothyroidism, unspecified: Secondary | ICD-10-CM | POA: Diagnosis not present

## 2020-02-10 LAB — COMPREHENSIVE METABOLIC PANEL
ALT: 11 U/L (ref 0–44)
AST: 40 U/L (ref 15–41)
Albumin: 4 g/dL (ref 3.5–5.0)
Alkaline Phosphatase: 113 U/L (ref 38–126)
Anion gap: 12 (ref 5–15)
BUN: 11 mg/dL (ref 8–23)
CO2: 26 mmol/L (ref 22–32)
Calcium: 9.1 mg/dL (ref 8.9–10.3)
Chloride: 96 mmol/L — ABNORMAL LOW (ref 98–111)
Creatinine, Ser: 0.83 mg/dL (ref 0.44–1.00)
GFR calc Af Amer: >60 mL/min (ref >60)
GFR calc non Af Amer: >60 mL/min (ref >60)
Glucose, Bld: 80 mg/dL (ref 70–99)
Potassium: 3.3 mmol/L — ABNORMAL LOW (ref 3.5–5.1)
Sodium: 134 mmol/L — ABNORMAL LOW (ref 135–145)
Total Bilirubin: 1.2 mg/dL (ref 0.3–1.2)
Total Protein: 7.8 g/dL (ref 6.5–8.1)

## 2020-02-10 LAB — CBC WITH DIFFERENTIAL/PLATELET
Abs Immature Granulocytes: 0.03 10*3/uL (ref 0.00–0.07)
Basophils Absolute: 0 10*3/uL (ref 0.0–0.1)
Basophils Relative: 0 %
Eosinophils Absolute: 0.1 10*3/uL (ref 0.0–0.5)
Eosinophils Relative: 1 %
HCT: 29.4 % — ABNORMAL LOW (ref 36.0–46.0)
Hemoglobin: 9.7 g/dL — ABNORMAL LOW (ref 12.0–15.0)
Immature Granulocytes: 1 %
Lymphocytes Relative: 20 %
Lymphs Abs: 1.2 10*3/uL (ref 0.7–4.0)
MCH: 30.4 pg (ref 26.0–34.0)
MCHC: 33 g/dL (ref 30.0–36.0)
MCV: 92.2 fL (ref 80.0–100.0)
Monocytes Absolute: 0.6 10*3/uL (ref 0.1–1.0)
Monocytes Relative: 10 %
Neutro Abs: 4.3 10*3/uL (ref 1.7–7.7)
Neutrophils Relative %: 68 %
Platelets: 225 10*3/uL (ref 150–400)
RBC: 3.19 MIL/uL — ABNORMAL LOW (ref 3.87–5.11)
RDW: 18.5 % — ABNORMAL HIGH (ref 11.5–15.5)
WBC: 6.3 10*3/uL (ref 4.0–10.5)
nRBC: 0 % (ref 0.0–0.2)

## 2020-02-10 LAB — MAGNESIUM: Magnesium: 2.2 mg/dL (ref 1.7–2.4)

## 2020-02-10 LAB — TSH: TSH: 77.829 u[IU]/mL — ABNORMAL HIGH (ref 0.350–4.500)

## 2020-02-10 MED ORDER — SODIUM CHLORIDE 0.9 % IV SOLN: Freq: Once | INTRAVENOUS | Status: AC

## 2020-02-10 MED ORDER — ALBUTEROL SULFATE (2.5 MG/3ML) 0.083% IN NEBU: 2.5000 mg | INHALATION_SOLUTION | Freq: Once | RESPIRATORY_TRACT | Status: DC | PRN

## 2020-02-10 MED ORDER — SODIUM CHLORIDE 0.9 % IV SOLN
200.0000 mg | Freq: Once | INTRAVENOUS | Status: AC
  Administered 2020-02-10: 200 mg via INTRAVENOUS
  Filled 2020-02-10: qty 8

## 2020-02-10 MED ORDER — FAMOTIDINE IN NACL 20-0.9 MG/50ML-% IV SOLN: 20.0000 mg | Freq: Once | INTRAVENOUS | Status: DC | PRN

## 2020-02-10 MED ORDER — HEPARIN SOD (PORK) LOCK FLUSH 100 UNIT/ML IV SOLN
500.0000 [IU] | Freq: Once | INTRAVENOUS | Status: AC | PRN
  Administered 2020-02-10: 500 [IU]

## 2020-02-10 MED ORDER — EPINEPHRINE 0.3 MG/0.3ML IJ SOAJ: 0.3000 mg | Freq: Once | INTRAMUSCULAR | Status: DC | PRN

## 2020-02-10 MED ORDER — FLUCONAZOLE 100 MG PO TABS: 100.0000 mg | ORAL_TABLET | Freq: Every day | ORAL | 0 refills | Status: DC

## 2020-02-10 MED ORDER — SODIUM CHLORIDE 0.9% FLUSH
10.0000 mL | INTRAVENOUS | Status: DC | PRN
  Administered 2020-02-10: 10 mL

## 2020-02-10 MED ORDER — METHYLPREDNISOLONE SODIUM SUCC 125 MG IJ SOLR: 125.0000 mg | Freq: Once | INTRAMUSCULAR | Status: DC | PRN

## 2020-02-10 MED ORDER — DIPHENHYDRAMINE HCL 50 MG/ML IJ SOLN: 50.0000 mg | Freq: Once | INTRAMUSCULAR | Status: DC | PRN

## 2020-02-10 MED ORDER — SODIUM CHLORIDE 0.9 % IV SOLN: Freq: Once | INTRAVENOUS | Status: DC | PRN

## 2020-02-10 NOTE — Patient Instructions (Addendum)
[place] at [facility] Discharge Instructions  You were seen today by Dr. Delton Coombes. He went over your recent lab results. He will repeat your scans prior to your next visit. He will see you back in 3 weeks for labs, treatment and follow up.   Thank you for choosing [place] at [facility] to provide your oncology and hematology care.  To afford each patient quality time with our provider, please arrive at least 15 minutes before your scheduled appointment time.   If you have a lab appointment with the [facility] please come in thru the  Main Entrance and check in at the main information desk  You need to re-schedule your appointment should you arrive 10 or more minutes late.  We strive to give you quality time with our providers, and arriving late affects you and other patients whose appointments are after yours.  Also, if you no show three or more times for appointments you may be dismissed from the clinic at the providers discretion.     Again, thank you for choosing [facility].  Our hope is that these requests will decrease the amount of time that you wait before being seen by our physicians.       _____________________________________________________________  Should you have questions after your visit to [facility], please contact our office at [phone] between the hours of 8:00 a.m. and 4:30 p.m.  Voicemails left after 4:00 p.m. will not be returned until the following business day.  For prescription refill requests, have your pharmacy contact our office and allow 72 hours.    Cancer Center Support Programs:   > Cancer Support Group  2nd Tuesday of the month 1pm-2pm, Journey Room

## 2020-02-10 NOTE — Patient Instructions (Signed)
[facility] Discharge Instructions for Patients Receiving Chemotherapy  Today you received the following chemotherapy agents   To help prevent nausea and vomiting after your treatment, we encourage you to take your nausea medication   If you develop nausea and vomiting that is not controlled by your nausea medication, call the clinic.   BELOW ARE SYMPTOMS THAT SHOULD BE REPORTED IMMEDIATELY:  *FEVER GREATER THAN 100.5 F  *CHILLS WITH OR WITHOUT FEVER  NAUSEA AND VOMITING THAT IS NOT CONTROLLED WITH YOUR NAUSEA MEDICATION  *UNUSUAL SHORTNESS OF BREATH  *UNUSUAL BRUISING OR BLEEDING  TENDERNESS IN MOUTH AND THROAT WITH OR WITHOUT PRESENCE OF ULCERS  *URINARY PROBLEMS  *BOWEL PROBLEMS  UNUSUAL RASH Items with * indicate a potential emergency and should be followed up as soon as possible.  Feel free to call the clinic should you have any questions or concerns. The clinic phone number is [phone].  Please show the CHEMO ALERT CARD at check-in to the Emergency Department and triage nurse.   

## 2020-02-10 NOTE — Progress Notes (Signed)
Labs reviewed today with treatment, proceed with treatment.    Treatment given per orders. Patient tolerated it well without problems. Vitals stable and discharged home from clinic via wheelchair. Follow up as scheduled.

## 2020-02-10 NOTE — Progress Notes (Signed)
Patient has been assessed, vital signs and labs have been reviewed by Dr. Katragadda. ANC, Creatinine, LFTs, and Platelets are within treatment parameters per Dr. Katragadda. The patient is good to proceed with treatment at this time.  

## 2020-02-10 NOTE — Progress Notes (Signed)
Lindsey Mullins, [place] [id number]   CLINIC:  Medical Oncology/Hematology  PCP:  [name], MD [address] [place] [address] [phone]   REASON FOR VISIT: Metastatic squamous cell lung cancer.   INTERVAL HISTORY:  Lindsey Mullins 74 y.o. female seen for follow-up of metastatic lung cancer and toxicity assessment prior to next cycle of treatment.  She continues to have trouble swallowing solid foods.  Food gets stuck behind the sternum and occasionally painful.  Reports pain 10 out of 10 in the lower back and left leg.  Appetite and energy levels are 25%.  She has taken to half tablets of Dilaudid so far.  Numbness in the hands and feet has been stable.  She has some constipation which is also stable.  REVIEW OF SYSTEMS:  Review of Systems  HENT:   Positive for trouble swallowing.   Gastrointestinal: Positive for constipation.  Musculoskeletal: Positive for back pain.  Neurological: Positive for numbness.  Psychiatric/Behavioral: Positive for sleep disturbance.  All other systems reviewed and are negative.    PAST MEDICAL/SURGICAL HISTORY:  Past Medical History:  Diagnosis Date  . Atypical chest pain 05/23/2012   STRESS TEST - small to moderate sized area of partial reversibility of the anteroapical wall, most likely breast artifact, post-stress EF 67%, EKG show NSR at 65, No Lexiscan EKG changes, non-diagnostic for ischemia; STRESS TEST, 01/25/2010 - normal study, post-stress EF 66%, no significant ischemia  . Cancer ([place])   . Cerebral atherosclerosis 09/29/2012   CAROTID DUPLEX - RIGHT  BULB/PROXIMAL ICA-moderate amount of fibrous plaque, 50-69% diameter reduction; LEFT CEA-normal, no significant diameter reduction  . Colitis   . GERD (gastroesophageal reflux disease)   . Hyperlipidemia   . Hypertension   . Osteopenia   . Restless leg syndrome   . Shortness of breath 03/30/2005   2D ECHO - EF >55%, normal  . TIA  (transient ischemic attack) 01/27/2010   2D ECHO - EF 65%, normal   Past Surgical History:  Procedure Laterality Date  . ABDOMINAL EXPLORATION SURGERY    . CAROTID ENDARTERECTOMY    . LYMPH NODE BIOPSY N/A 05/20/2019   Procedure: LYMPH NODE BIOPSY CERVICAL;  Surgeon: Aviva Signs, MD;  Location: [facility];  Service: General;  Laterality: N/A;  . Peripheral Vascular Catheterization  07/16/2005   Right verterbral-50% smooth segmental badsilar artery stenosis on right side, Right carotid-50% ulcerated proxmial stenosis, Left carotid-60 to 70% left externam carotid artery stenosis, 90% proximal left ICA stenosis  . PORTACATH PLACEMENT Left 06/12/2019   Procedure: INSERTION PORT-A-CATH (catheter attached left subclavian);  Surgeon: Aviva Signs, MD;  Location: [facility];  Service: General;  Laterality: Left;     SOCIAL HISTORY:  Social History   Socioeconomic History  . Marital status: Divorced    Spouse name: Not on file  . Number of children: 2  . Years of education: Not on file  . Highest education level: Not on file  Occupational History  . Not on file  Tobacco Use  . Smoking status: Former Smoker    Packs/day: 0.25    Years: 10.00    Pack years: 2.50    Types: Cigarettes    Quit date: 04/21/1983    Years since quitting: 36.8  . Smokeless tobacco: Never Used  Substance and Sexual Activity  . Alcohol use: No  . Drug use: No  . Sexual activity: Not Currently  Other Topics Concern  . Not  on file  Social History Narrative  . Not on file   Social Determinants of Health   Financial Resource Strain: Low Risk   . Difficulty of Paying Living Expenses: Not hard at all  Food Insecurity: No Food Insecurity  . Worried About Charity fundraiser in the Last Year: Never true  . Ran Out of Food in the Last Year: Never true  Transportation Needs: No Transportation Needs  . Lack of Transportation (Medical): No  . Lack of Transportation (Non-Medical): No  Physical Activity: Inactive  . Days  of Exercise per Week: 0 days  . Minutes of Exercise per Session: 0 min  Stress: No Stress Concern Present  . Feeling of Stress : Not at all  Social Connections: Somewhat Isolated  . Frequency of Communication with Friends and Family: Twice a week  . Frequency of Social Gatherings with Friends and Family: Twice a week  . Attends Religious Services: More than 4 times per year  . Active Member of Clubs or Organizations: No  . Attends Archivist Meetings: Never  . Marital Status: Divorced  Human resources officer Violence: Not At Risk  . Fear of Current or Ex-Partner: No  . Emotionally Abused: No  . Physically Abused: No  . Sexually Abused: No    FAMILY HISTORY:  Family History  Problem Relation Age of Onset  . Heart disease Mother   . Hypertension Mother   . Hypertension Father   . Heart disease Father     CURRENT MEDICATIONS:  Outpatient Encounter Medications as of 02/10/2020  Medication Sig  . gabapentin (NEURONTIN) 100 MG capsule Take 2 capsules (200 mg total) by mouth at bedtime.  [name] levothyroxine (SYNTHROID) 50 MCG tablet Take 1 tablet (50 mcg total) by mouth daily before breakfast.  . losartan (COZAAR) 50 MG tablet Take 1/2 (one-half) tablet by mouth once daily (Patient taking differently: Take 25 mg by mouth daily. )  . Omega-3 Krill Oil 500 MG CAPS Take 500 mg by mouth daily.   [name] omeprazole (PRILOSEC) 20 MG capsule Take 1 po daily (Patient taking differently: Take 20 mg by mouth 2 (two) times daily. )  . polyethylene glycol powder (GLYCOLAX/MIRALAX) 17 GM/SCOOP powder Take 17 g by mouth 2 (two) times daily. Until daily soft stools  OTC  . rosuvastatin (CRESTOR) 20 MG tablet Take 20 mg by mouth at bedtime.  . senna (SENOKOT) 8.6 MG TABS tablet Take 2 tablets by mouth daily.  . fluconazole (DIFLUCAN) 100 MG tablet Take 1 tablet (100 mg total) by mouth daily. Take 2 tablets today and then one tablet a day for 6 more days  . naproxen sodium (ALEVE) 220 MG tablet Take 220 mg  by mouth 2 (two) times daily as needed (pain).   . nitroGLYCERIN (NITROSTAT) 0.4 MG SL tablet DISSOLVE ONE TABLET UNDER THE TONGUE AS NEEDED FOR CHEST PAIN**MAX 3 TABLETS EACH 5 MINUTES APART** (Patient not taking: Reported on 02/10/2020)   Facility-Administered Encounter Medications as of 02/10/2020  Medication  . sodium chloride flush (NS) 0.9 % injection 10 mL    ALLERGIES:  Allergies  Allergen Reactions  . Penicillins Shortness Of Breath and Other (See Comments)    Has patient had a PCN reaction causing immediate rash, facial/tongue/throat swelling, SOB or lightheadedness with hypotension: Yes Has patient had a PCN reaction causing severe rash involving mucus membranes or skin necrosis: Unk Has patient had a PCN reaction that required hospitalization: Unk Has patient had a PCN reaction occurring within the last  10 years: Yes "States that she was in ICU after being administered"   . Codeine Nausea And Vomiting  . Lipitor [Atorvastatin] Other (See Comments)    Myalgias  . Lisinopril Cough  . Pravastatin Other (See Comments)    Myalgias     PHYSICAL EXAM:  ECOG Performance status: 1  Vitals:   02/10/20 1137  BP: 136/75  Pulse: 75  Resp: 18  Temp: (!) 97.5 F (36.4 C)  SpO2: 98%   Filed Weights   02/10/20 1137  Weight: 169 lb 6.4 oz (76.8 kg)    Physical Exam Constitutional:      Appearance: Normal appearance. She is normal weight.  Cardiovascular:     Rate and Rhythm: Normal rate and regular rhythm.     Heart sounds: Normal heart sounds.  Pulmonary:     Effort: Pulmonary effort is normal.     Breath sounds: Normal breath sounds.  Abdominal:     General: Bowel sounds are normal.     Palpations: Abdomen is soft.  Musculoskeletal:        General: Normal range of motion.  Skin:    General: Skin is warm and dry.  Neurological:     Mental Status: She is alert and oriented to person, place, and time. Mental status is at baseline.  Psychiatric:        Mood and  Affect: Mood normal.        Behavior: Behavior normal.        Thought Content: Thought content normal.        Judgment: Judgment normal.      LABORATORY DATA:  I have reviewed the labs as listed.  CBC    Component Value Date/Time   WBC 6.3 02/10/2020 1143   RBC 3.19 (L) 02/10/2020 1143   HGB 9.7 (L) 02/10/2020 1143   HCT 29.4 (L) 02/10/2020 1143   PLT 225 02/10/2020 1143   MCV 92.2 02/10/2020 1143   MCH 30.4 02/10/2020 1143   MCHC 33.0 02/10/2020 1143   RDW 18.5 (H) 02/10/2020 1143   LYMPHSABS 1.2 02/10/2020 1143   MONOABS 0.6 02/10/2020 1143   EOSABS 0.1 02/10/2020 1143   BASOSABS 0.0 02/10/2020 1143   CMP Latest Ref Rng & Units 02/10/2020 01/20/2020 12/30/2019  Glucose 70 - 99 mg/dL 80 77 82  BUN 8 - 23 mg/dL '11 9 8  ' Creatinine 0.44 - 1.00 mg/dL 0.83 1.02(H) 1.02(H)  Sodium 135 - 145 mmol/L 134(L) 137 137  Potassium 3.5 - 5.1 mmol/L 3.3(L) 3.4(L) 3.7  Chloride 98 - 111 mmol/L 96(L) 100 101  CO2 22 - 32 mmol/L '26 25 26  ' Calcium 8.9 - 10.3 mg/dL 9.1 9.4 9.7  Total Protein 6.5 - 8.1 g/dL 7.8 8.2(H) 8.4(H)  Total Bilirubin 0.3 - 1.2 mg/dL 1.2 0.8 0.9  Alkaline Phos 38 - 126 U/L 113 109 111  AST 15 - 41 U/L 40 48(H) 52(H)  ALT 0 - 44 U/L '11 16 19       ' DIAGNOSTIC IMAGING:  I have independently reviewed scans.   ASSESSMENT & PLAN:   Squamous cell carcinoma of lung, stage IV, right ([place]) 1.  Metastatic squamous cell carcinoma of the lung, PD-L1 TPS-10%, foundation 1 with no targetable mutations: -4 cycles of carboplatin, paclitaxel and pembrolizumab from 06/15/2019 through 08/18/2019. -Last restaging CT scans were done on 10/26/2019. -Maintenance pembrolizumab started on 09/08/2019. -I reviewed her labs today.  White count and LFTs are normal.  She will proceed with Keytruda.  I plan to  repeat CT CAP prior to next visit in 3 weeks.  2.  Low back pain: -MRI of the thoracic spine showed small amount of anterior epidural tumor at T5.  Metastatic disease in the vertebral  body T4, T5, T10 and L1.  Posterior disease at T3, T7 and T10. -She complains of left hip pain and shoots down the left leg.  She had an epidural injection which did not help. -She was evaluated by Dr.Yanagihara.  She will go to him on Thursday to start radiation. -I have encouraged her to take Dilaudid half tablet as needed.  3.  Hypothyroidism: -She is taking Synthroid 50 mcg daily.  TSH today has improved to 77 from 117 at last visit.  4.  Trouble swallowing: -He has pain swallowing behind the retrosternal area.  She is soon scheduled for EGD. -I will start her on Diflucan with loading dose 200 mg followed by 100 mg daily for 7 days for empiric treatment of candidal esophagitis.  5.  Elevated creatinine: -Creatinine was elevated at 1.02 last time.  It has improved to 0.83 today.    Orders placed this encounter:  Orders Placed This Encounter  Procedures  . CT Abdomen Pelvis W Contrast  . CT Chest W Contrast  . CBC with Differential  . Comprehensive metabolic panel     Derek Jack, MD [place] [phone]

## 2020-02-11 DIAGNOSIS — Z510 Encounter for antineoplastic radiation therapy: Secondary | ICD-10-CM | POA: Diagnosis not present

## 2020-02-11 DIAGNOSIS — G893 Neoplasm related pain (acute) (chronic): Secondary | ICD-10-CM | POA: Diagnosis not present

## 2020-02-11 DIAGNOSIS — C7951 Secondary malignant neoplasm of bone: Secondary | ICD-10-CM | POA: Diagnosis not present

## 2020-02-11 DIAGNOSIS — C3431 Malignant neoplasm of lower lobe, right bronchus or lung: Secondary | ICD-10-CM | POA: Diagnosis not present

## 2020-02-13 ENCOUNTER — Encounter (HOSPITAL_COMMUNITY): Payer: Self-pay | Admitting: Hematology

## 2020-02-13 NOTE — Assessment & Plan Note (Signed)
1.  Metastatic squamous cell carcinoma of the lung, PD-L1 TPS-10%, foundation 1 with no targetable mutations: -4 cycles of carboplatin, paclitaxel and pembrolizumab from 06/15/2019 through 08/18/2019. -Last restaging CT scans were done on 10/26/2019. -Maintenance pembrolizumab started on 09/08/2019. -I reviewed her labs today.  White count and LFTs are normal.  She will proceed with Keytruda.  I plan to repeat CT CAP prior to next visit in 3 weeks.  2.  Low back pain: -MRI of the thoracic spine showed small amount of anterior epidural tumor at T5.  Metastatic disease in the vertebral body T4, T5, T10 and L1.  Posterior disease at T3, T7 and T10. -She complains of left hip pain and shoots down the left leg.  She had an epidural injection which did not help. -She was evaluated by Dr.Yanagihara.  She will go to him on Thursday to start radiation. -I have encouraged her to take Dilaudid half tablet as needed.  3.  Hypothyroidism: -She is taking Synthroid 50 mcg daily.  TSH today has improved to 77 from 117 at last visit.  4.  Trouble swallowing: -He has pain swallowing behind the retrosternal area.  She is soon scheduled for EGD. -I will start her on Diflucan with loading dose 200 mg followed by 100 mg daily for 7 days for empiric treatment of candidal esophagitis.  5.  Elevated creatinine: -Creatinine was elevated at 1.02 last time.  It has improved to 0.83 today.

## 2020-02-19 ENCOUNTER — Other Ambulatory Visit: Payer: Self-pay

## 2020-02-19 ENCOUNTER — Emergency Department (HOSPITAL_COMMUNITY)
Admission: EM | Admit: 2020-02-19 | Discharge: 2020-02-19 | Disposition: A | Discharge status: Home / Self Care | Payer: Medicare PPO | Attending: Emergency Medicine | Admitting: Emergency Medicine

## 2020-02-19 ENCOUNTER — Encounter (HOSPITAL_COMMUNITY): Payer: Self-pay

## 2020-02-19 DIAGNOSIS — M5489 Other dorsalgia: Secondary | ICD-10-CM | POA: Diagnosis present

## 2020-02-19 DIAGNOSIS — Z79899 Other long term (current) drug therapy: Secondary | ICD-10-CM | POA: Diagnosis not present

## 2020-02-19 DIAGNOSIS — G8929 Other chronic pain: Secondary | ICD-10-CM

## 2020-02-19 DIAGNOSIS — M5440 Lumbago with sciatica, unspecified side: Secondary | ICD-10-CM | POA: Diagnosis not present

## 2020-02-19 DIAGNOSIS — Z87891 Personal history of nicotine dependence: Secondary | ICD-10-CM | POA: Diagnosis not present

## 2020-02-19 DIAGNOSIS — I10 Essential (primary) hypertension: Secondary | ICD-10-CM | POA: Insufficient documentation

## 2020-02-19 DIAGNOSIS — E039 Hypothyroidism, unspecified: Secondary | ICD-10-CM | POA: Insufficient documentation

## 2020-02-19 MED ORDER — HYDROMORPHONE HCL 1 MG/ML IJ SOLN
0.5000 mg | Freq: Once | INTRAMUSCULAR | Status: AC
  Administered 2020-02-19: 0.5 mg via INTRAMUSCULAR
  Filled 2020-02-19: qty 1

## 2020-02-19 NOTE — Discharge Instructions (Addendum)
Take your pain medicine as prescribed by your doctor.  Follow-up with need to

## 2020-02-19 NOTE — ED Triage Notes (Signed)
Pt states she took a pain pill (naproxen) and states 30 mins later the pain worsened.

## 2020-02-19 NOTE — ED Notes (Signed)
Dr Earnest Conroy in to assess and patient refuses meds until her daughter arrives  Pt tells Dr Earnest Conroy that she is afraid of dying, and declines dilaudid until her daughter is here   She reports that she was here for a procedure, but could not lie on the table so she could not complete the treatment

## 2020-02-19 NOTE — ED Provider Notes (Signed)
[facility] EMERGENCY DEPARTMENT Provider Note   CSN: [id number] Arrival date & time: 02/19/20  1919     History Chief Complaint  Patient presents with  . Back Pain    Lindsey Mullins is a 74 y.o. female.  Patient complains of worsening back pain.  Patient hurts in her lower back.  Patient has severe metastatic disease to her back and is prescribed Dilaudid for pain.  Patient only takes 0.125 mg of Dilaudid for her pain although her doctor has prescribed 0.5 mg for pain  The history is provided by the patient. No language interpreter was used.  Back Pain Location:  Lumbar spine Quality:  Aching Radiates to:  Does not radiate Pain severity:  Moderate Pain is:  Worse during the day Onset quality:  Sudden Timing:  Constant Progression:  Worsening Relieved by:  Nothing Worsened by:  Nothing Ineffective treatments:  None tried Associated symptoms: no abdominal pain, no chest pain and no headaches        Past Medical History:  Diagnosis Date  . Atypical chest pain 05/23/2012   STRESS TEST - small to moderate sized area of partial reversibility of the anteroapical wall, most likely breast artifact, post-stress EF 67%, EKG show NSR at 65, No Lexiscan EKG changes, non-diagnostic for ischemia; STRESS TEST, 01/25/2010 - normal study, post-stress EF 66%, no significant ischemia  . Cancer ([place])   . Cerebral atherosclerosis 09/29/2012   CAROTID DUPLEX - RIGHT  BULB/PROXIMAL ICA-moderate amount of fibrous plaque, 50-69% diameter reduction; LEFT CEA-normal, no significant diameter reduction  . Colitis   . GERD (gastroesophageal reflux disease)   . Hyperlipidemia   . Hypertension   . Osteopenia   . Restless leg syndrome   . Shortness of breath 03/30/2005   2D ECHO - EF >55%, normal  . TIA (transient ischemic attack) 01/27/2010   2D ECHO - EF 65%, normal    Patient Active Problem List   Diagnosis Date Noted  . Hypothyroidism 01/26/2020  . Peripheral edema 09/11/2019  . Malignant  neoplasm of right lung ([place])   . Goals of care, counseling/discussion 06/10/2019  . Squamous cell carcinoma of lung, stage IV, right ([place]) 05/28/2019  . Metastatic cancer ([place]) 05/26/2019  . Cervical lymphadenopathy   . Compression fracture of L2 ([place]) 04/29/2019  . Arteriosclerosis, mesenteric artery ([place]) 12/31/2018  . Abdominal pain 12/22/2018  . Colitis 12/22/2018  . Osteopenia   . DDD (degenerative disc disease), lumbar 05/11/2016  . Obesity 05/11/2016  . Glucose intolerance (impaired glucose tolerance) 01/12/2016  . Pain in the chest   . Chest pain 01/10/2016  . GERD (gastroesophageal reflux disease) 01/26/2015  . Stress and adjustment reaction 01/26/2015  . Dysuria 11/30/2013  . Carotid artery disease ([place]) 04/20/2013  . Bunion, left 09/21/2012  . Overweight(278.02) 06/04/2012  . Essential hypertension, benign 04/02/2012  . Hyperlipidemia 04/02/2012  . Dysphagia 04/02/2012  . RLS (restless legs syndrome) 04/02/2012    Past Surgical History:  Procedure Laterality Date  . ABDOMINAL EXPLORATION SURGERY    . CAROTID ENDARTERECTOMY    . LYMPH NODE BIOPSY N/A 05/20/2019   Procedure: LYMPH NODE BIOPSY CERVICAL;  Surgeon: Aviva Signs, MD;  Location: [facility];  Service: General;  Laterality: N/A;  . Peripheral Vascular Catheterization  07/16/2005   Right verterbral-50% smooth segmental badsilar artery stenosis on right side, Right carotid-50% ulcerated proxmial stenosis, Left carotid-60 to 70% left externam carotid artery stenosis, 90% proximal left ICA stenosis  . PORTACATH PLACEMENT Left 06/12/2019   Procedure: INSERTION PORT-A-CATH (  catheter attached left subclavian);  Surgeon: Aviva Signs, MD;  Location: [facility];  Service: General;  Laterality: Left;     OB History    Gravida  2   Para  2   Term  2   Preterm      AB      Living        SAB      TAB      Ectopic      Multiple      Live Births              Family History  Problem Relation Age of Onset    . Heart disease Mother   . Hypertension Mother   . Hypertension Father   . Heart disease Father     Social History   Tobacco Use  . Smoking status: Former Smoker    Packs/day: 0.25    Years: 10.00    Pack years: 2.50    Types: Cigarettes    Quit date: 04/21/1983    Years since quitting: 36.8  . Smokeless tobacco: Never Used  Substance Use Topics  . Alcohol use: No  . Drug use: No    Home Medications Prior to Admission medications   Medication Sig Start Date End Date Taking? Authorizing Provider  fluconazole (DIFLUCAN) 100 MG tablet Take 1 tablet (100 mg total) by mouth daily. Take 2 tablets today and then one tablet a day for 6 more days 02/10/20   Derek Jack, MD  gabapentin (NEURONTIN) 100 MG capsule Take 2 capsules (200 mg total) by mouth at bedtime. 07/24/19   [name], MD  levothyroxine (SYNTHROID) 50 MCG tablet Take 1 tablet (50 mcg total) by mouth daily before breakfast. 01/26/20   Nida, Marella Chimes, MD  losartan (COZAAR) 50 MG tablet Take 1/2 (one-half) tablet by mouth once daily Patient taking differently: Take 25 mg by mouth daily.  06/22/19   Lorretta Harp, MD  naproxen sodium (ALEVE) 220 MG tablet Take 220 mg by mouth 2 (two) times daily as needed (pain).     [provider]  nitroGLYCERIN (NITROSTAT) 0.4 MG SL tablet DISSOLVE ONE TABLET UNDER THE TONGUE AS NEEDED FOR CHEST PAIN**MAX 3 TABLETS EACH 5 MINUTES APART** Patient not taking: Reported on 02/10/2020 09/14/16   [name], MD  Omega-3 Krill Oil 500 MG CAPS Take 500 mg by mouth daily.     [provider]  omeprazole (PRILOSEC) 20 MG capsule Take 1 po daily Patient taking differently: Take 20 mg by mouth 2 (two) times daily.  12/31/18   Zalma, Modena Nunnery, MD  polyethylene glycol powder [facility]) 17 GM/SCOOP powder Take 17 g by mouth 2 (two) times daily. Until daily soft stools  OTC 06/19/19   Antonietta Breach, PA-C  rosuvastatin (CRESTOR) 20 MG tablet Take 20 mg by  mouth at bedtime. 10/08/19   [provider]  senna (SENOKOT) 8.6 MG TABS tablet Take 2 tablets by mouth daily.    [provider]    Allergies    Penicillins, Codeine, Lipitor [atorvastatin], Lisinopril, and Pravastatin  Review of Systems   Review of Systems  Constitutional: Negative for appetite change and fatigue.  HENT: Negative for congestion, ear discharge and sinus pressure.   Eyes: Negative for discharge.  Respiratory: Negative for cough.   Cardiovascular: Negative for chest pain.  Gastrointestinal: Negative for abdominal pain and diarrhea.  Genitourinary: Negative for frequency and hematuria.  Musculoskeletal: Positive for back pain.  Skin: Negative  for rash.  Neurological: Negative for seizures and headaches.  Psychiatric/Behavioral: Negative for hallucinations.    Physical Exam Updated Vital Signs BP 104/68   Pulse 78   Temp 97.8 F (36.6 C) (Oral)   Resp 16   Ht 5\' 6"  (1.676 m)   Wt 76.7 kg   SpO2 93%   BMI 27.28 kg/m   Physical Exam Vitals and nursing note reviewed.  Constitutional:      Appearance: She is well-developed.  HENT:     Head: Normocephalic.     Nose: Nose normal.  Eyes:     General: No scleral icterus.    Conjunctiva/sclera: Conjunctivae normal.  Neck:     Thyroid: No thyromegaly.  Cardiovascular:     Rate and Rhythm: Normal rate and regular rhythm.     Heart sounds: No murmur. No friction rub. No gallop.   Pulmonary:     Breath sounds: No stridor. No wheezing or rales.  Chest:     Chest wall: No tenderness.  Abdominal:     General: There is no distension.     Tenderness: There is no abdominal tenderness. There is no rebound.  Musculoskeletal:     Cervical back: Neck supple.     Comments: Tender lumbar spine  Lymphadenopathy:     Cervical: No cervical adenopathy.  Skin:    Findings: No erythema or rash.  Neurological:     Mental Status: She is alert and oriented to person, place, and time.     Motor: No  abnormal muscle tone.     Coordination: Coordination normal.  Psychiatric:        Behavior: Behavior normal.     ED Results / Procedures / Treatments   Labs (all labs ordered are listed, but only abnormal results are displayed) Labs Reviewed - No data to display  EKG None  Radiology No results found.  Procedures Procedures (including critical care time)  Medications Ordered in ED Medications  HYDROmorphone (DILAUDID) injection 0.5 mg (0.5 mg Intramuscular Given 02/19/20 2035)    ED Course  I have reviewed the triage vital signs and the nursing notes.  Pertinent labs & imaging results that were available during my care of the patient were reviewed by me and considered in my medical decision making (see chart for details).    MDM Rules/Calculators/A&P                      Patient with lumbar spine pain from metastatic disease.  She was given half milligram injection of Dilaudid and felt better.  She was sent home and told to take her Dilaudid as prescribed by her doctor Final Clinical Impression(s) / ED Diagnoses Final diagnoses:  Chronic low back pain with sciatica, sciatica laterality unspecified, unspecified back pain laterality    Rx / DC Orders ED Discharge Orders    None       Milton Ferguson, MD 02/19/20 2134

## 2020-02-24 ENCOUNTER — Encounter: Payer: Self-pay | Admitting: Nurse Practitioner

## 2020-02-26 ENCOUNTER — Telehealth (HOSPITAL_COMMUNITY): Payer: Self-pay | Admitting: *Deleted

## 2020-02-26 ENCOUNTER — Other Ambulatory Visit (HOSPITAL_COMMUNITY): Payer: Self-pay | Admitting: Nurse Practitioner

## 2020-02-26 ENCOUNTER — Ambulatory Visit (HOSPITAL_COMMUNITY): Admission: RE | Admit: 2020-02-26 | Payer: Medicare PPO | Source: Ambulatory Visit

## 2020-02-26 NOTE — Progress Notes (Signed)

## 2020-02-26 NOTE — Telephone Encounter (Signed)
Pt's daughter called into clinic asking if Dr. Raliegh Ip could see pt sooner than Wednesday. Pt's daughter stated pt is having back pain, not well eating, trouble swallowing. Pt is scheduled to see the GI doctor on 03/08/20 per daughter. Daughter also stated pt couldn't lay flat, and having soreness across her abdomen, and c/o dizziness. I spoke with Francene Finders, NP and she stated pt is staring back treatment Wednesday and needed to see Dr. Raliegh Ip. A sooner appointment wasn't available at this time. I called and spoke with pt's daughter and she verbalized understanding.

## 2020-03-01 ENCOUNTER — Ambulatory Visit (INDEPENDENT_AMBULATORY_CARE_PROVIDER_SITE_OTHER): Payer: Medicare PPO | Admitting: Family Medicine

## 2020-03-01 ENCOUNTER — Encounter: Payer: Self-pay | Admitting: Family Medicine

## 2020-03-01 ENCOUNTER — Other Ambulatory Visit: Payer: Self-pay

## 2020-03-01 VITALS — BP 118/62 | HR 72 | Temp 98.0°F | Resp 12

## 2020-03-01 DIAGNOSIS — C3491 Malignant neoplasm of unspecified part of right bronchus or lung: Secondary | ICD-10-CM

## 2020-03-01 DIAGNOSIS — N390 Urinary tract infection, site not specified: Secondary | ICD-10-CM | POA: Diagnosis not present

## 2020-03-01 MED ORDER — CIPROFLOXACIN HCL 250 MG PO TABS: 250.0000 mg | ORAL_TABLET | Freq: Two times a day (BID) | ORAL | 0 refills | Status: DC

## 2020-03-01 NOTE — Progress Notes (Signed)
Subjective:    Patient ID: Lindsey Mullins, female    DOB: 06-24-1946, 74 y.o.   MRN: [id number]  Patient presents for Dysuria   Pt here with burning with urination started last week, has been getting worse the past few days.  She gets pressure after she urinates.  She urinated before she came to the office was unable to leave a sample.  She has very small bowel movements, since she is not eating very much.  Sheis drinking a lot of fluids. She attempted to have radiation therapy for palliative care for her metastatic cancer however she cannot tolerate the severe pain on the table.  She has appointment with her oncologist tomorrow to discuss further treatment options.  She did take a quarter of her Dilaudid but we did discuss that she can take the entire pill before her treatments in the future as another possibility.    Scheduled for GI on April 21st for her swallowing  She does not tolerate the thyroid pill states that makes her sick every time she takes it but she does not want to take this anymore.  Her blood pressure has been low since she has not been feeling well they've not been giving her the medication the past few days wants to know if she can just stop this as well.      Review Of Systems:  GEN- denies fatigue, fever, weight loss,weakness, recent illness HEENT- denies eye drainage, change in vision, nasal discharge, CVS- denies chest pain, palpitations RESP- denies SOB, cough, wheeze ABD- denies N/V, change in stools, abd pain GU- + dysuria, hematuria, dribbling, incontinence MSK- denies joint pain, muscle aches, injury Neuro- denies headache, dizziness, syncope, seizure activity       Objective:    BP 118/62   Pulse 72   Temp 98 F (36.7 C) (Temporal)   Resp 12   SpO2 94%  GEN- NAD, alert and oriented x3, chronically ill and weak appearing ,sitting in wheelchair  HEENT- PERRL, EOMI, non injected sclera, pink conjunctiva, MMM, oropharynx clear Neck- Supple,   CVS- RRR, no murmur RESP-CTAB ABD-NABS,soft,mild ttp suprapubic, no rebound, no guarding, no CVA tenderness  EXT- No edema Pulses- Radial,  2+        Assessment & Plan:      Problem List Items Addressed This Visit      Unprioritized   Squamous cell carcinoma of lung, stage IV, right [facility])    Patient with metastatic cancer.  She continues to decline.  She has difficulty trying to figure out how to get her radiation treatments for her bony pain in her back and hip.  She will follow-up with her oncologist tomorrow.  I did discuss that she can take the entire Dilaudid before the procedure.  Family wants know she can get an epidural done before having the procedure I think this would be difficult to coordinate but they've already placed a call to her radiation oncologist about this.  At this point she can stop her blood pressure medication she has low normotensive blood pressure even without the half a tablet of the losartan.  She is also going to stop the thyroid medication she is aware of the effect this can have on her body but it makes her sick.  I will send a message to her endocrinologist about this.  I think that we're heading down the palliative care route.  I advised her that she can also stop the statin drug as the risk versus benefit  are higher in her current state      Relevant Medications   ciprofloxacin (CIPRO) 250 MG tablet    Other Visit Diagnoses    Urinary tract infection without hematuria, site unspecified    -  Primary   Treat based on symptoms, has tolerated cipro and pt daughter able to crush if needed. will give 250bid x 5 days.      Note: This dictation was prepared with Dragon dictation along with smaller phrase technology. Any transcriptional errors that result from this process are unintentional.

## 2020-03-01 NOTE — Patient Instructions (Signed)
STop the losartan, cholesterol medicine/ thyroid I will send a note to Dr. Dorris Fetch

## 2020-03-02 ENCOUNTER — Inpatient Hospital Stay (HOSPITAL_COMMUNITY): Payer: Medicare PPO | Admitting: Hematology

## 2020-03-02 ENCOUNTER — Encounter: Payer: Self-pay | Admitting: Family Medicine

## 2020-03-02 ENCOUNTER — Inpatient Hospital Stay (HOSPITAL_COMMUNITY): Payer: Medicare PPO

## 2020-03-02 ENCOUNTER — Inpatient Hospital Stay (HOSPITAL_COMMUNITY): Payer: Medicare PPO | Attending: Hematology

## 2020-03-02 ENCOUNTER — Encounter (HOSPITAL_COMMUNITY): Payer: Self-pay | Admitting: Hematology

## 2020-03-02 ENCOUNTER — Other Ambulatory Visit (INDEPENDENT_AMBULATORY_CARE_PROVIDER_SITE_OTHER): Payer: Self-pay | Admitting: *Deleted

## 2020-03-02 VITALS — BP 85/46 | HR 88 | Temp 96.9°F | Resp 17 | Wt 162.0 lb

## 2020-03-02 VITALS — BP 148/75 | HR 79 | Temp 97.3°F | Resp 16

## 2020-03-02 DIAGNOSIS — Z5112 Encounter for antineoplastic immunotherapy: Secondary | ICD-10-CM | POA: Insufficient documentation

## 2020-03-02 DIAGNOSIS — Z87891 Personal history of nicotine dependence: Secondary | ICD-10-CM | POA: Diagnosis not present

## 2020-03-02 DIAGNOSIS — M8580 Other specified disorders of bone density and structure, unspecified site: Secondary | ICD-10-CM | POA: Diagnosis not present

## 2020-03-02 DIAGNOSIS — C3491 Malignant neoplasm of unspecified part of right bronchus or lung: Secondary | ICD-10-CM | POA: Insufficient documentation

## 2020-03-02 DIAGNOSIS — E785 Hyperlipidemia, unspecified: Secondary | ICD-10-CM | POA: Diagnosis not present

## 2020-03-02 DIAGNOSIS — Z8673 Personal history of transient ischemic attack (TIA), and cerebral infarction without residual deficits: Secondary | ICD-10-CM | POA: Diagnosis not present

## 2020-03-02 DIAGNOSIS — M545 Low back pain: Secondary | ICD-10-CM | POA: Diagnosis not present

## 2020-03-02 DIAGNOSIS — I10 Essential (primary) hypertension: Secondary | ICD-10-CM | POA: Diagnosis not present

## 2020-03-02 DIAGNOSIS — Z79899 Other long term (current) drug therapy: Secondary | ICD-10-CM | POA: Diagnosis not present

## 2020-03-02 DIAGNOSIS — R1310 Dysphagia, unspecified: Secondary | ICD-10-CM

## 2020-03-02 DIAGNOSIS — R531 Weakness: Secondary | ICD-10-CM | POA: Diagnosis not present

## 2020-03-02 DIAGNOSIS — E039 Hypothyroidism, unspecified: Secondary | ICD-10-CM | POA: Diagnosis not present

## 2020-03-02 DIAGNOSIS — C7800 Secondary malignant neoplasm of unspecified lung: Secondary | ICD-10-CM

## 2020-03-02 DIAGNOSIS — R634 Abnormal weight loss: Secondary | ICD-10-CM | POA: Insufficient documentation

## 2020-03-02 DIAGNOSIS — Z01818 Encounter for other preprocedural examination: Secondary | ICD-10-CM | POA: Diagnosis not present

## 2020-03-02 DIAGNOSIS — R944 Abnormal results of kidney function studies: Secondary | ICD-10-CM | POA: Diagnosis not present

## 2020-03-02 DIAGNOSIS — K219 Gastro-esophageal reflux disease without esophagitis: Secondary | ICD-10-CM | POA: Insufficient documentation

## 2020-03-02 LAB — COMPREHENSIVE METABOLIC PANEL
ALT: 15 U/L (ref 0–44)
AST: 43 U/L — ABNORMAL HIGH (ref 15–41)
Albumin: 3.8 g/dL (ref 3.5–5.0)
Alkaline Phosphatase: 134 U/L — ABNORMAL HIGH (ref 38–126)
Anion gap: 15 (ref 5–15)
BUN: 12 mg/dL (ref 8–23)
CO2: 24 mmol/L (ref 22–32)
Calcium: 9.3 mg/dL (ref 8.9–10.3)
Chloride: 91 mmol/L — ABNORMAL LOW (ref 98–111)
Creatinine, Ser: 0.68 mg/dL (ref 0.44–1.00)
GFR calc Af Amer: >60 mL/min (ref >60)
GFR calc non Af Amer: >60 mL/min (ref >60)
Glucose, Bld: 80 mg/dL (ref 70–99)
Potassium: 3.6 mmol/L (ref 3.5–5.1)
Sodium: 130 mmol/L — ABNORMAL LOW (ref 135–145)
Total Bilirubin: 1.3 mg/dL — ABNORMAL HIGH (ref 0.3–1.2)
Total Protein: 7.7 g/dL (ref 6.5–8.1)

## 2020-03-02 LAB — CBC WITH DIFFERENTIAL/PLATELET
Abs Immature Granulocytes: 0.03 10*3/uL (ref 0.00–0.07)
Basophils Absolute: 0.1 10*3/uL (ref 0.0–0.1)
Basophils Relative: 1 %
Eosinophils Absolute: 0.1 10*3/uL (ref 0.0–0.5)
Eosinophils Relative: 1 %
HCT: 34.4 % — ABNORMAL LOW (ref 36.0–46.0)
Hemoglobin: 11 g/dL — ABNORMAL LOW (ref 12.0–15.0)
Immature Granulocytes: 1 %
Lymphocytes Relative: 21 %
Lymphs Abs: 1.3 10*3/uL (ref 0.7–4.0)
MCH: 30.8 pg (ref 26.0–34.0)
MCHC: 32 g/dL (ref 30.0–36.0)
MCV: 96.4 fL (ref 80.0–100.0)
Monocytes Absolute: 0.6 10*3/uL (ref 0.1–1.0)
Monocytes Relative: 10 %
Neutro Abs: 4 10*3/uL (ref 1.7–7.7)
Neutrophils Relative %: 66 %
Platelets: 235 10*3/uL (ref 150–400)
RBC: 3.57 MIL/uL — ABNORMAL LOW (ref 3.87–5.11)
RDW: 17.1 % — ABNORMAL HIGH (ref 11.5–15.5)
WBC: 6 10*3/uL (ref 4.0–10.5)
nRBC: 0 % (ref 0.0–0.2)

## 2020-03-02 MED ORDER — HYDROMORPHONE HCL 1 MG/ML IJ SOLN
1.0000 mg | Freq: Once | INTRAMUSCULAR | Status: AC
  Administered 2020-03-02: 1 mg via INTRAVENOUS
  Filled 2020-03-02: qty 1

## 2020-03-02 MED ORDER — HEPARIN SOD (PORK) LOCK FLUSH 100 UNIT/ML IV SOLN
500.0000 [IU] | Freq: Once | INTRAVENOUS | Status: AC | PRN
  Administered 2020-03-02: 500 [IU]

## 2020-03-02 MED ORDER — SODIUM CHLORIDE 0.9 % IV SOLN: Freq: Once | INTRAVENOUS | Status: AC

## 2020-03-02 MED ORDER — SODIUM CHLORIDE 0.9 % IV SOLN
200.0000 mg | Freq: Once | INTRAVENOUS | Status: AC
  Administered 2020-03-02: 200 mg via INTRAVENOUS
  Filled 2020-03-02: qty 8

## 2020-03-02 MED ORDER — SODIUM CHLORIDE 0.9 % IV SOLN
Freq: Once | INTRAVENOUS | Status: AC
  Filled 2020-03-02: qty 10

## 2020-03-02 MED ORDER — SODIUM CHLORIDE 0.9% FLUSH
10.0000 mL | INTRAVENOUS | Status: DC | PRN
  Administered 2020-03-02: 12:00:00 10 mL

## 2020-03-02 NOTE — Progress Notes (Signed)
[place], [place] [address]   CLINIC:  Medical Oncology/Hematology  PCP:  [name], MD [address] [place] [address] [phone]   REASON FOR VISIT: Metastatic squamous cell lung cancer.   INTERVAL HISTORY:  Ms. Haub 74 y.o. female seen for follow-up of metastatic lung cancer and toxicity management prior to next cycle of immunotherapy.  Her daughter [name] was on the phone.  She reported appetite 25%.  Energy levels are low.  Continues to have low back pain radiating to the left hip.  Could not have radiation therapy done as she is unable to lie on the table.  She started taking about one fourth of Dilaudid pill at this time as needed.  Still having dysphagia to thick liquids and solids.  She has an appointment to see GI in [address] end of this month.  However patient is getting very weak.  She lost 7 pounds in the last 3 weeks.  REVIEW OF SYSTEMS:  Review of Systems  HENT:   Positive for trouble swallowing.   Respiratory: Positive for shortness of breath.   Musculoskeletal: Positive for back pain.  Neurological: Positive for numbness.  Psychiatric/Behavioral: Positive for sleep disturbance.  All other systems reviewed and are negative.    PAST MEDICAL/SURGICAL HISTORY:  Past Medical History:  Diagnosis Date  . Atypical chest pain 05/23/2012   STRESS TEST - small to moderate sized area of partial reversibility of the anteroapical wall, most likely breast artifact, post-stress EF 67%, EKG show NSR at 65, No Lexiscan EKG changes, non-diagnostic for ischemia; STRESS TEST, 01/25/2010 - normal study, post-stress EF 66%, no significant ischemia  . Cancer ([place])   . Cerebral atherosclerosis 09/29/2012   CAROTID DUPLEX - RIGHT  BULB/PROXIMAL ICA-moderate amount of fibrous plaque, 50-69% diameter reduction; LEFT CEA-normal, no significant diameter reduction  . Colitis   . GERD (gastroesophageal reflux disease)   .  Hyperlipidemia   . Hypertension   . Osteopenia   . Restless leg syndrome   . Shortness of breath 03/30/2005   2D ECHO - EF >55%, normal  . TIA (transient ischemic attack) 01/27/2010   2D ECHO - EF 65%, normal   Past Surgical History:  Procedure Laterality Date  . ABDOMINAL EXPLORATION SURGERY    . CAROTID ENDARTERECTOMY    . LYMPH NODE BIOPSY N/A 05/20/2019   Procedure: LYMPH NODE BIOPSY CERVICAL;  Surgeon: Aviva Signs, MD;  Location: [facility];  Service: General;  Laterality: N/A;  . Peripheral Vascular Catheterization  07/16/2005   Right verterbral-50% smooth segmental badsilar artery stenosis on right side, Right carotid-50% ulcerated proxmial stenosis, Left carotid-60 to 70% left externam carotid artery stenosis, 90% proximal left ICA stenosis  . PORTACATH PLACEMENT Left 06/12/2019   Procedure: INSERTION PORT-A-CATH (catheter attached left subclavian);  Surgeon: Aviva Signs, MD;  Location: [facility];  Service: General;  Laterality: Left;     SOCIAL HISTORY:  Social History   Socioeconomic History  . Marital status: Divorced    Spouse name: Not on file  . Number of children: 2  . Years of education: Not on file  . Highest education level: Not on file  Occupational History  . Not on file  Tobacco Use  . Smoking status: Former Smoker    Packs/day: 0.25    Years: 10.00    Pack years: 2.50    Types: Cigarettes    Quit date: 04/21/1983    Years since quitting: 36.8  .  Smokeless tobacco: Never Used  Substance and Sexual Activity  . Alcohol use: No  . Drug use: No  . Sexual activity: Not Currently  Other Topics Concern  . Not on file  Social History Narrative  . Not on file   Social Determinants of Health   Financial Resource Strain: Low Risk   . Difficulty of Paying Living Expenses: Not hard at all  Food Insecurity: No Food Insecurity  . Worried About Charity fundraiser in the Last Year: Never true  . Ran Out of Food in the Last Year: Never true  Transportation Needs: No  Transportation Needs  . Lack of Transportation (Medical): No  . Lack of Transportation (Non-Medical): No  Physical Activity: Inactive  . Days of Exercise per Week: 0 days  . Minutes of Exercise per Session: 0 min  Stress: No Stress Concern Present  . Feeling of Stress : Not at all  Social Connections: Somewhat Isolated  . Frequency of Communication with Friends and Family: Twice a week  . Frequency of Social Gatherings with Friends and Family: Twice a week  . Attends Religious Services: More than 4 times per year  . Active Member of Clubs or Organizations: No  . Attends Archivist Meetings: Never  . Marital Status: Divorced  Human resources officer Violence: Not At Risk  . Fear of Current or Ex-Partner: No  . Emotionally Abused: No  . Physically Abused: No  . Sexually Abused: No    FAMILY HISTORY:  Family History  Problem Relation Age of Onset  . Heart disease Mother   . Hypertension Mother   . Hypertension Father   . Heart disease Father     CURRENT MEDICATIONS:  Outpatient Encounter Medications as of 03/02/2020  Medication Sig Note  . ciprofloxacin (CIPRO) 250 MG tablet Take 1 tablet (250 mg total) by mouth 2 (two) times daily. 03/02/2020: Pt will finish on 03/06/20  . HYDROmorphone (DILAUDID) 2 MG tablet Take 0.5 mg by mouth every 4 (four) hours as needed for severe pain.    [name] omeprazole (PRILOSEC) 20 MG capsule Take 1 po daily (Patient taking differently: Take 20 mg by mouth daily. )   . gabapentin (NEURONTIN) 100 MG capsule Take 2 capsules (200 mg total) by mouth at bedtime. (Patient not taking: Reported on 03/02/2020)   . nitroGLYCERIN (NITROSTAT) 0.4 MG SL tablet DISSOLVE ONE TABLET UNDER THE TONGUE AS NEEDED FOR CHEST PAIN**MAX 3 TABLETS EACH 5 MINUTES APART**   . polyethylene glycol powder (GLYCOLAX/MIRALAX) 17 GM/SCOOP powder Take 17 g by mouth 2 (two) times daily. Until daily soft stools  OTC (Patient not taking: Reported on 03/02/2020)   . [DISCONTINUED]  levothyroxine (SYNTHROID) 50 MCG tablet Take 1 tablet (50 mcg total) by mouth daily before breakfast.   . [DISCONTINUED] losartan (COZAAR) 50 MG tablet Take 1/2 (one-half) tablet by mouth once daily (Patient taking differently: Take 25 mg by mouth daily. )   . [DISCONTINUED] naproxen sodium (ALEVE) 220 MG tablet Take 220 mg by mouth 2 (two) times daily as needed (pain).    . [DISCONTINUED] Omega-3 Krill Oil 500 MG CAPS Take 500 mg by mouth daily.    . [DISCONTINUED] rosuvastatin (CRESTOR) 20 MG tablet Take 20 mg by mouth at bedtime.   . [DISCONTINUED] senna (SENOKOT) 8.6 MG TABS tablet Take 2 tablets by mouth daily.    Facility-Administered Encounter Medications as of 03/02/2020  Medication  . sodium chloride flush (NS) 0.9 % injection 10 mL    ALLERGIES:  Allergies  Allergen Reactions  . Penicillins Shortness Of Breath and Other (See Comments)    Has patient had a PCN reaction causing immediate rash, facial/tongue/throat swelling, SOB or lightheadedness with hypotension: Yes Has patient had a PCN reaction causing severe rash involving mucus membranes or skin necrosis: Unk Has patient had a PCN reaction that required hospitalization: Unk Has patient had a PCN reaction occurring within the last 10 years: Yes "States that she was in ICU after being administered"   . Codeine Nausea And Vomiting  . Lipitor [Atorvastatin] Other (See Comments)    Myalgias  . Lisinopril Cough  . Pravastatin Other (See Comments)    Myalgias     PHYSICAL EXAM:  ECOG Performance status: 1  Vitals:   03/02/20 1029  BP: (!) 85/46  Pulse: 88  Resp: 17  Temp: (!) 96.9 F (36.1 C)  SpO2: 98%   Filed Weights   03/02/20 1029  Weight: 162 lb (73.5 kg)    Physical Exam Constitutional:      Appearance: Normal appearance. She is normal weight.  Cardiovascular:     Rate and Rhythm: Normal rate and regular rhythm.     Heart sounds: Normal heart sounds.  Pulmonary:     Effort: Pulmonary effort is  normal.     Breath sounds: Normal breath sounds.  Abdominal:     General: Bowel sounds are normal.     Palpations: Abdomen is soft.  Musculoskeletal:        General: Normal range of motion.  Skin:    General: Skin is warm and dry.  Neurological:     Mental Status: She is alert and oriented to person, place, and time. Mental status is at baseline.  Psychiatric:        Mood and Affect: Mood normal.        Behavior: Behavior normal.        Thought Content: Thought content normal.        Judgment: Judgment normal.      LABORATORY DATA:  I have reviewed the labs as listed.  CBC    Component Value Date/Time   WBC 6.0 03/02/2020 0922   RBC 3.57 (L) 03/02/2020 0922   HGB 11.0 (L) 03/02/2020 0922   HCT 34.4 (L) 03/02/2020 0922   PLT 235 03/02/2020 0922   MCV 96.4 03/02/2020 0922   MCH 30.8 03/02/2020 0922   MCHC 32.0 03/02/2020 0922   RDW 17.1 (H) 03/02/2020 0922   LYMPHSABS 1.3 03/02/2020 0922   MONOABS 0.6 03/02/2020 0922   EOSABS 0.1 03/02/2020 0922   BASOSABS 0.1 03/02/2020 0922   CMP Latest Ref Rng & Units 03/02/2020 02/10/2020 01/20/2020  Glucose 70 - 99 mg/dL 80 80 77  BUN 8 - 23 mg/dL _0 Creatinine 0.44 - 1.00 mg/dL 0.68 0.83 1.02(H)  Sodium 135 - 145 mmol/L 130(L) 134(L) 137  Potassium 3.5 - 5.1 mmol/L 3.6 3.3(L) 3.4(L)  Chloride 98 - 111 mmol/L 91(L) 96(L) 100  CO2 22 - 32 mmol/L _1 Calcium 8.9 - 10.3 mg/dL 9.3 9.1 9.4  Total Protein 6.5 - 8.1 g/dL 7.7 7.8 8.2(H)  Total Bilirubin 0.3 - 1.2 mg/dL 1.3(H) 1.2 0.8  Alkaline Phos 38 - 126 U/L 134(H) 113 109  AST 15 - 41 U/L 43(H) 40 48(H)  ALT 0 - 44 U/L _2 DIAGNOSTIC IMAGING:  I have independently reviewed scans with the patient.   ASSESSMENT & PLAN:  Squamous cell carcinoma of lung, stage IV, right ([place]) 1.  Metastatic squamous cell carcinoma of the lung, PD-L1 TPS-10%, foundation 1 with no targetable mutations: -4 cycles of carboplatin, paclitaxel and pembrolizumab from 06/15/2019  through 08/18/2019. -I reviewed last restaging CT scans from 10/26/2019.  No significant adenopathy in the chest that would cause dysphagia. -Maintenance pembrolizumab started on 09/08/2019. -She is feeling weak as she lost 7 pounds.  We reviewed her labs which show normal LFTs.  CBC was normal.  Total bilirubin is mildly elevated at 1.3. -We will proceed with her treatment today.  She could not complete CT scans as she is unable to lie flat.  She is also not able to drink all the contrast. -We will coordinate with radiology and give her some IV Dilaudid prior to the procedure. -She will also receive 1 L of fluid with electrolytes today as her blood pressure is 85/46.  2.  Low back pain: -MRI of the thoracic spine showed small amount of anterior epidural tumor at T5.  Metastatic disease in the vertebral body T4, T5, T10 and L1.  Posterior disease at T3, T7 and T10. -She has left hip pain which shoots down the left leg.  She had epidural injection which did not help. -She was evaluated by Dr.Yanagihara.  Apparently she was unable to lie on the radiation table and could not start radiation. -We will reach out to radiation oncology to see if they can premedicate her with IV Dilaudid prior to treatment. -She is finally taking one fourth of a Dilaudid tablet 2-3 times a day.  3.  Hypothyroidism: -She will continue Synthroid 50 mcg daily.  4.  Difficulty swallowing: -She did not have any improvement after Diflucan.  She lost 7 pounds in the last 3 weeks. -She has difficulty swallowing thick liquids as well as solids.  I have reached out to Dr. Laural Golden who kindly agreed to see this patient do EGD either tomorrow or Friday.  We will obtain COVID-19 testing.  5.  Elevated creatinine: -Creatinine which was previously elevated at 1.02 has improved to 0.6 today.    Orders placed this encounter:  Orders Placed This Encounter  Procedures  . CT Chest W Contrast  . CT Abdomen Pelvis W Contrast  Total  time spent is 40 minutes with more than 60% of the time spent face-to-face discussing treatment plan, reviewing scans, counseling and coordination of care.   Derek Jack, MD [place] [phone]

## 2020-03-02 NOTE — Assessment & Plan Note (Addendum)
1.  Metastatic squamous cell carcinoma of the lung, PD-L1 TPS-10%, foundation 1 with no targetable mutations: -4 cycles of carboplatin, paclitaxel and pembrolizumab from 06/15/2019 through 08/18/2019. -I reviewed last restaging CT scans from 10/26/2019.  No significant adenopathy in the chest that would cause dysphagia. -Maintenance pembrolizumab started on 09/08/2019. -She is feeling weak as she lost 7 pounds.  We reviewed her labs which show normal LFTs.  CBC was normal.  Total bilirubin is mildly elevated at 1.3. -We will proceed with her treatment today.  She could not complete CT scans as she is unable to lie flat.  She is also not able to drink all the contrast. -We will coordinate with radiology and give her some IV Dilaudid prior to the procedure. -She will also receive 1 L of fluid with electrolytes today as her blood pressure is 85/46.  2.  Low back pain: -MRI of the thoracic spine showed small amount of anterior epidural tumor at T5.  Metastatic disease in the vertebral body T4, T5, T10 and L1.  Posterior disease at T3, T7 and T10. -She has left hip pain which shoots down the left leg.  She had epidural injection which did not help. -She was evaluated by Dr.Yanagihara.  Apparently she was unable to lie on the radiation table and could not start radiation. -We will reach out to radiation oncology to see if they can premedicate her with IV Dilaudid prior to treatment. -She is finally taking one fourth of a Dilaudid tablet 2-3 times a day.  3.  Hypothyroidism: -She will continue Synthroid 50 mcg daily.  4.  Difficulty swallowing: -She did not have any improvement after Diflucan.  She lost 7 pounds in the last 3 weeks. -She has difficulty swallowing thick liquids as well as solids.  I have reached out to Dr. Laural Golden who kindly agreed to see this patient do EGD either tomorrow or Friday.  We will obtain COVID-19 testing.  5.  Elevated creatinine: -Creatinine which was previously elevated at  1.02 has improved to 0.6 today.

## 2020-03-02 NOTE — Patient Instructions (Addendum)
[place] at [facility] Discharge Instructions  You were seen today by Dr. Delton Coombes. He went over your recent lab results. He will schedule an appointment with Joli our nutritionist to help with your nutrition. He will contact radiation oncology to find out if medication can be given prior to radiation. He will alsop contact GI about seeing you sooner than the 21st. He will repeat scans prior to your next visit. He will see you back in 3 weeks for labs and follow up.   Thank you for choosing [place] at [facility] to provide your oncology and hematology care.  To afford each patient quality time with our provider, please arrive at least 15 minutes before your scheduled appointment time.   If you have a lab appointment with the [facility] please come in thru the  Main Entrance and check in at the main information desk  You need to re-schedule your appointment should you arrive 10 or more minutes late.  We strive to give you quality time with our providers, and arriving late affects you and other patients whose appointments are after yours.  Also, if you no show three or more times for appointments you may be dismissed from the clinic at the providers discretion.     Again, thank you for choosing [facility].  Our hope is that these requests will decrease the amount of time that you wait before being seen by our physicians.       _____________________________________________________________  Should you have questions after your visit to [facility], please contact our office at [phone] between the hours of 8:00 a.m. and 4:30 p.m.  Voicemails left after 4:00 p.m. will not be returned until the following business day.  For prescription refill requests, have your pharmacy contact our office and allow 72 hours.    Cancer Center Support Programs:   > Cancer Support Group  2nd Tuesday of the month 1pm-2pm, Journey  Room

## 2020-03-02 NOTE — Patient Instructions (Signed)
[facility] Discharge Instructions for Patients Receiving Chemotherapy  Today you received the following chemotherapy agents   To help prevent nausea and vomiting after your treatment, we encourage you to take your nausea medication   If you develop nausea and vomiting that is not controlled by your nausea medication, call the clinic.   BELOW ARE SYMPTOMS THAT SHOULD BE REPORTED IMMEDIATELY:  *FEVER GREATER THAN 100.5 F  *CHILLS WITH OR WITHOUT FEVER  NAUSEA AND VOMITING THAT IS NOT CONTROLLED WITH YOUR NAUSEA MEDICATION  *UNUSUAL SHORTNESS OF BREATH  *UNUSUAL BRUISING OR BLEEDING  TENDERNESS IN MOUTH AND THROAT WITH OR WITHOUT PRESENCE OF ULCERS  *URINARY PROBLEMS  *BOWEL PROBLEMS  UNUSUAL RASH Items with * indicate a potential emergency and should be followed up as soon as possible.  Feel free to call the clinic should you have any questions or concerns. The clinic phone number is [phone].  Please show the CHEMO ALERT CARD at check-in to the Emergency Department and triage nurse.   

## 2020-03-02 NOTE — Progress Notes (Signed)
Labs reviewed with MD today. Will give hydration fluids with electrolytes and pain medication with treatment today.   1205-patient states her pain is gone. Her saturation and BP are stable. See flowsheet.   1228-rechecked oxygen saturation, patient resting at this time. sats were 88% on RA, applied Nasal canula at 1 liter for comfort while she is resting from the dilaudid.   Therapy complete. Oxygen level was 94% on RA.  Treatment given per orders. Patient tolerated it well without problems. Vitals stable and discharged home from clinic via wheelchair.  Follow up as scheduled.

## 2020-03-02 NOTE — Assessment & Plan Note (Signed)
Patient with metastatic cancer.  She continues to decline.  She has difficulty trying to figure out how to get her radiation treatments for her bony pain in her back and hip.  She will follow-up with her oncologist tomorrow.  I did discuss that she can take the entire Dilaudid before the procedure.  Family wants know she can get an epidural done before having the procedure I think this would be difficult to coordinate but they've already placed a call to her radiation oncologist about this.  At this point she can stop her blood pressure medication she has low normotensive blood pressure even without the half a tablet of the losartan.  She is also going to stop the thyroid medication she is aware of the effect this can have on her body but it makes her sick.  I will send a message to her endocrinologist about this.  I think that we're heading down the palliative care route.  I advised her that she can also stop the statin drug as the risk versus benefit are higher in her current state

## 2020-03-02 NOTE — Progress Notes (Signed)
Patient has been assessed, vital signs and labs have been reviewed by Dr. Delton Coombes. ANC, Creatinine, LFTs, and Platelets are within treatment parameters per Dr. Delton Coombes. The patient is good to proceed with treatment at this time. Please give pt 1mg  Dilaudid IV as well as 1 liter of fluids with electrolytes per Dr. Delton Coombes.

## 2020-03-03 ENCOUNTER — Encounter (HOSPITAL_COMMUNITY): Payer: Self-pay

## 2020-03-03 ENCOUNTER — Other Ambulatory Visit (HOSPITAL_COMMUNITY)
Admission: RE | Admit: 2020-03-03 | Discharge: 2020-03-03 | Disposition: A | Discharge status: Home / Self Care | Payer: Medicare PPO | Source: Ambulatory Visit | Attending: Internal Medicine | Admitting: Internal Medicine

## 2020-03-03 ENCOUNTER — Other Ambulatory Visit: Payer: Self-pay

## 2020-03-03 ENCOUNTER — Encounter (HOSPITAL_COMMUNITY)
Admission: RE | Admit: 2020-03-03 | Discharge: 2020-03-03 | Disposition: A | Discharge status: Home / Self Care | Payer: Medicare PPO | Source: Ambulatory Visit | Attending: Internal Medicine | Admitting: Internal Medicine

## 2020-03-03 DIAGNOSIS — Z20822 Contact with and (suspected) exposure to covid-19: Secondary | ICD-10-CM | POA: Insufficient documentation

## 2020-03-03 DIAGNOSIS — Z01812 Encounter for preprocedural laboratory examination: Secondary | ICD-10-CM | POA: Insufficient documentation

## 2020-03-03 MED ORDER — HYDROMORPHONE HCL 2 MG PO TABS: 0.5000 mg | ORAL_TABLET | ORAL | 0 refills | Status: DC | PRN

## 2020-03-03 NOTE — Addendum Note (Signed)
Addended by: Derek Jack on: 03/03/2020 12:57 PM   Modules accepted: Orders

## 2020-03-04 ENCOUNTER — Ambulatory Visit (HOSPITAL_COMMUNITY): Payer: Medicare PPO | Admitting: Anesthesiology

## 2020-03-04 ENCOUNTER — Other Ambulatory Visit: Payer: Self-pay

## 2020-03-04 ENCOUNTER — Ambulatory Visit (HOSPITAL_COMMUNITY)
Admission: RE | Admit: 2020-03-04 | Discharge: 2020-03-04 | Disposition: A | Discharge status: Home / Self Care | Payer: Medicare PPO | Attending: Internal Medicine | Admitting: Internal Medicine

## 2020-03-04 ENCOUNTER — Encounter (HOSPITAL_COMMUNITY)
Admission: RE | Disposition: A | Discharge status: Home / Self Care | Payer: Self-pay | Source: Home / Self Care | Attending: Internal Medicine

## 2020-03-04 ENCOUNTER — Encounter (HOSPITAL_COMMUNITY): Payer: Self-pay | Admitting: *Deleted

## 2020-03-04 ENCOUNTER — Encounter (HOSPITAL_COMMUNITY): Payer: Self-pay | Admitting: Internal Medicine

## 2020-03-04 ENCOUNTER — Ambulatory Visit (HOSPITAL_COMMUNITY): Payer: Medicare PPO

## 2020-03-04 DIAGNOSIS — Z9221 Personal history of antineoplastic chemotherapy: Secondary | ICD-10-CM | POA: Diagnosis not present

## 2020-03-04 DIAGNOSIS — Z79890 Hormone replacement therapy (postmenopausal): Secondary | ICD-10-CM | POA: Diagnosis not present

## 2020-03-04 DIAGNOSIS — R1314 Dysphagia, pharyngoesophageal phase: Secondary | ICD-10-CM | POA: Insufficient documentation

## 2020-03-04 DIAGNOSIS — Z79899 Other long term (current) drug therapy: Secondary | ICD-10-CM | POA: Diagnosis not present

## 2020-03-04 DIAGNOSIS — I10 Essential (primary) hypertension: Secondary | ICD-10-CM | POA: Insufficient documentation

## 2020-03-04 DIAGNOSIS — K222 Esophageal obstruction: Secondary | ICD-10-CM | POA: Insufficient documentation

## 2020-03-04 DIAGNOSIS — K449 Diaphragmatic hernia without obstruction or gangrene: Secondary | ICD-10-CM | POA: Insufficient documentation

## 2020-03-04 DIAGNOSIS — R1310 Dysphagia, unspecified: Secondary | ICD-10-CM

## 2020-03-04 DIAGNOSIS — E039 Hypothyroidism, unspecified: Secondary | ICD-10-CM | POA: Diagnosis not present

## 2020-03-04 DIAGNOSIS — E785 Hyperlipidemia, unspecified: Secondary | ICD-10-CM | POA: Diagnosis not present

## 2020-03-04 DIAGNOSIS — K219 Gastro-esophageal reflux disease without esophagitis: Secondary | ICD-10-CM | POA: Insufficient documentation

## 2020-03-04 DIAGNOSIS — Z87891 Personal history of nicotine dependence: Secondary | ICD-10-CM | POA: Insufficient documentation

## 2020-03-04 DIAGNOSIS — C799 Secondary malignant neoplasm of unspecified site: Secondary | ICD-10-CM | POA: Insufficient documentation

## 2020-03-04 DIAGNOSIS — Z8673 Personal history of transient ischemic attack (TIA), and cerebral infarction without residual deficits: Secondary | ICD-10-CM | POA: Diagnosis not present

## 2020-03-04 DIAGNOSIS — C3490 Malignant neoplasm of unspecified part of unspecified bronchus or lung: Secondary | ICD-10-CM | POA: Diagnosis not present

## 2020-03-04 DIAGNOSIS — Z859 Personal history of malignant neoplasm, unspecified: Secondary | ICD-10-CM | POA: Diagnosis not present

## 2020-03-04 HISTORY — PX: ESOPHAGOGASTRODUODENOSCOPY (EGD) WITH PROPOFOL: SHX5813

## 2020-03-04 LAB — SARS CORONAVIRUS 2 (TAT 6-24 HRS): SARS Coronavirus 2: NEGATIVE

## 2020-03-04 SURGERY — ESOPHAGOGASTRODUODENOSCOPY (EGD) WITH PROPOFOL
Anesthesia: General

## 2020-03-04 MED ORDER — PROPOFOL 500 MG/50ML IV EMUL
INTRAVENOUS | Status: DC | PRN
  Administered 2020-03-04: 100 ug/kg/min via INTRAVENOUS

## 2020-03-04 MED ORDER — PHENYLEPHRINE HCL (PRESSORS) 10 MG/ML IV SOLN
INTRAVENOUS | Status: DC | PRN
  Administered 2020-03-04: 100 ug via INTRAVENOUS

## 2020-03-04 MED ORDER — IOHEXOL 300 MG/ML  SOLN
75.0000 mL | Freq: Once | INTRAMUSCULAR | Status: AC | PRN
  Administered 2020-03-04: 75 mL via INTRAVENOUS
  Filled 2020-03-04: qty 75

## 2020-03-04 MED ORDER — LIDOCAINE HCL (CARDIAC) PF 100 MG/5ML IV SOSY
PREFILLED_SYRINGE | INTRAVENOUS | Status: DC | PRN
  Administered 2020-03-04: 60 mg via INTRAVENOUS

## 2020-03-04 MED ORDER — LACTATED RINGERS IV SOLN: INTRAVENOUS | Status: DC | PRN

## 2020-03-04 MED ORDER — CHLORHEXIDINE GLUCONATE CLOTH 2 % EX PADS: 6.0000 | MEDICATED_PAD | Freq: Once | CUTANEOUS | Status: DC

## 2020-03-04 MED ORDER — LACTATED RINGERS IV SOLN: Freq: Once | INTRAVENOUS | Status: AC

## 2020-03-04 NOTE — Discharge Instructions (Signed)
Resume usual medications as before. Full liquid diet. No driving for 24 hours. Chest CT as planned by Dr. Delton Coombes.   Upper Endoscopy, Adult, Care After This sheet gives you information about how to care for yourself after your procedure. Your health care provider may also give you more specific instructions. If you have problems or questions, contact your health care provider. What can I expect after the procedure? After the procedure, it is common to have:  A sore throat.  Mild stomach pain or discomfort.  Bloating.  Nausea. Follow these instructions at home:   Follow instructions from your health care provider about what to eat or drink after your procedure.  Return to your normal activities as told by your health care provider. Ask your health care provider what activities are safe for you.  Take over-the-counter and prescription medicines only as told by your health care provider.  Do not drive for 24 hours if you were given a sedative during your procedure.  Keep all follow-up visits as told by your health care provider. This is important. Contact a health care provider if you have:  A sore throat that lasts longer than one day.  Trouble swallowing. Get help right away if:  You vomit blood or your vomit looks like coffee grounds.  You have: ? A fever. ? Bloody, black, or tarry stools. ? A severe sore throat or you cannot swallow. ? Difficulty breathing. ? Severe pain in your chest or abdomen. Summary  After the procedure, it is common to have a sore throat, mild stomach discomfort, bloating, and nausea.  Do not drive for 24 hours if you were given a sedative during the procedure.  Follow instructions from your health care provider about what to eat or drink after your procedure.  Return to your normal activities as told by your health care provider. This information is not intended to replace advice given to you by your health care provider. Make sure you  discuss any questions you have with your health care provider. Document Revised: 04/29/2018 Document Reviewed: 04/07/2018 Elsevier Patient Education  2020 [place] Anesthesia is a term that refers to techniques, procedures, and medicines that help a person stay safe and comfortable during a medical procedure. Monitored anesthesia care, or sedation, is one type of anesthesia. Your anesthesia specialist may recommend sedation if you will be having a procedure that does not require you to be unconscious, such as:  Cataract surgery.  A dental procedure.  A biopsy.  A colonoscopy. During the procedure, you may receive a medicine to help you relax (sedative). There are three levels of sedation:  Mild sedation. At this level, you may feel awake and relaxed. You will be able to follow directions.  Moderate sedation. At this level, you will be sleepy. You may not remember the procedure.  Deep sedation. At this level, you will be asleep. You will not remember the procedure. The more medicine you are given, the deeper your level of sedation will be. Depending on how you respond to the procedure, the anesthesia specialist may change your level of sedation or the type of anesthesia to fit your needs. An anesthesia specialist will monitor you closely during the procedure. Let your health care provider know about:  Any allergies you have.  All medicines you are taking, including vitamins, herbs, eye drops, creams, and over-the-counter medicines.  Any use of steroids (by mouth or as a cream).  Any problems you or family members have  had with sedatives and anesthetic medicines.  Any blood disorders you have.  Any surgeries you have had.  Any medical conditions you have, such as sleep apnea.  Whether you are pregnant or may be pregnant.  Any use of cigarettes, alcohol, or street drugs. What are the risks? Generally, this is a safe procedure. However, problems  may occur, including:  Getting too much medicine (oversedation).  Nausea.  Allergic reaction to medicines.  Trouble breathing. If this happens, a breathing tube may be used to help with breathing. It will be removed when you are awake and breathing on your own.  Heart trouble.  Lung trouble. Before the procedure Staying hydrated Follow instructions from your health care provider about hydration, which may include:  Up to 2 hours before the procedure - you may continue to drink clear liquids, such as water, clear fruit juice, black coffee, and plain tea. Eating and drinking restrictions Follow instructions from your health care provider about eating and drinking, which may include:  8 hours before the procedure - stop eating heavy meals or foods such as meat, fried foods, or fatty foods.  6 hours before the procedure - stop eating light meals or foods, such as toast or cereal.  6 hours before the procedure - stop drinking milk or drinks that contain milk.  2 hours before the procedure - stop drinking clear liquids. Medicines Ask your health care provider about:  Changing or stopping your regular medicines. This is especially important if you are taking diabetes medicines or blood thinners.  Taking medicines such as aspirin and ibuprofen. These medicines can thin your blood. Do not take these medicines before your procedure if your health care provider instructs you not to. Tests and exams  You will have a physical exam.  You may have blood tests done to show: ? How well your kidneys and liver are working. ? How well your blood can clot. General instructions  Plan to have someone take you home from the hospital or clinic.  If you will be going home right after the procedure, plan to have someone with you for 24 hours.  What happens during the procedure?  Your blood pressure, heart rate, breathing, level of pain and overall condition will be monitored.  An IV tube will  be inserted into one of your veins.  Your anesthesia specialist will give you medicines as needed to keep you comfortable during the procedure. This may mean changing the level of sedation.  The procedure will be performed. After the procedure  Your blood pressure, heart rate, breathing rate, and blood oxygen level will be monitored until the medicines you were given have worn off.  Do not drive for 24 hours if you received a sedative.  You may: ? Feel sleepy, clumsy, or nauseous. ? Feel forgetful about what happened after the procedure. ? Have a sore throat if you had a breathing tube during the procedure. ? Vomit. This information is not intended to replace advice given to you by your health care provider. Make sure you discuss any questions you have with your health care provider. Document Revised: 10/18/2017 Document Reviewed: 02/26/2016 Elsevier Patient Education  [place].

## 2020-03-04 NOTE — Progress Notes (Signed)
I spoke with patient today to present a pain medication management plan for the days that she will go for radiation treatment.  I explained to her that it takes 1 hour after taking medication for it to reach its full efficacy.  I explained that she needs to take her pain medication at least 1 hour before her appointments with radiation so that she can tolerate being on the table for 10-15 minutes.  Patient is in agreement with this plan and states that if it doesn't work she will call me.    I have called [facility] and advised [place] that patient is going to try this new pain management plan and she states that her physician wants patient to have daily control with her pain medication and not just during her radiation treatments.  He feels that this will maximize her treatments effect if she has some form of established control.    I have attempted to call back and talk with [name] and [name] - her daughter, to advise of the plan.  I was unable to get them on the phone.  I do know at this time, Ms. Spera is here for procedure.  I left a detailed VM and ask that they return my call for clarification if needed.    Dr. Delton Coombes is aware of the conversations today.

## 2020-03-04 NOTE — Anesthesia Postprocedure Evaluation (Signed)
Anesthesia Post Note  Patient: Lindsey Mullins  Procedure(s) Performed: ESOPHAGOGASTRODUODENOSCOPY (EGD) WITH PROPOFOL (N/A )  Patient location during evaluation: PACU Anesthesia Type: General Level of consciousness: awake, oriented, awake and alert and patient cooperative Pain management: pain level controlled Vital Signs Assessment: post-procedure vital signs reviewed and stable Respiratory status: spontaneous breathing, respiratory function stable and nonlabored ventilation Cardiovascular status: stable Postop Assessment: no apparent nausea or vomiting Anesthetic complications: no     Last Vitals:  Vitals:   03/04/20 1309  BP: (!) 150/90  Pulse: 86  Resp: 14  Temp: (!) 36.4 C  SpO2: 96%    Last Pain:  Vitals:   03/04/20 1401  TempSrc:   PainSc: 7                  Maegan Buller

## 2020-03-04 NOTE — Op Note (Signed)
[facility] Patient Name: Lindsey Mullins Procedure Date: 03/04/2020 1:22 PM MRN: [id number] Date of Birth: 1946/09/08 Attending MD: Hildred Laser , MD CSN: [id number] Age: 74 Admit Type: Outpatient Procedure:                Upper GI endoscopy Indications:              Esophageal dysphagia, Personal history of malignant                            neoplasm Providers:                Hildred Laser, MD, Otis Peak B. Sharon Seller, RN, Raphael Gibney, Technician Referring MD:             Derek Jack, MD Medicines:                Propofol per Anesthesia Complications:            No immediate complications. Estimated Blood Loss:     Estimated blood loss was minimal. Procedure:                Pre-Anesthesia Assessment:                           - Prior to the procedure, a History and Physical                            was performed, and patient medications and                            allergies were reviewed. The patient's tolerance of                            previous anesthesia was also reviewed. The risks                            and benefits of the procedure and the sedation                            options and risks were discussed with the patient.                            All questions were answered, and informed consent                            was obtained. Prior Anticoagulants: The patient has                            taken no previous anticoagulant or antiplatelet                            agents. ASA Grade Assessment: III - A patient with  severe systemic disease. After reviewing the risks                            and benefits, the patient was deemed in                            satisfactory condition to undergo the procedure.                           After obtaining informed consent, the endoscope was                            passed under direct vision. Throughout the   procedure, the patient's blood pressure, pulse, and                            oxygen saturations were monitored continuously. The                            GIF-H190 ([id number]) scope was introduced through the                            mouth, and advanced to the second part of duodenum.                            The upper GI endoscopy was accomplished without                            difficulty. The patient tolerated the procedure                            well. Scope In: 2:10:52 PM Scope Out: 2:23:04 PM Total Procedure Duration: 0 hours 12 minutes 12 seconds  Findings:      The hypopharynx was normal.      The proximal esophagus was normal.      One extrinsic severe stenosis was found 27 to 33 cm from the incisors.       This stenosis measured 6 mm (inner diameter) x 6 cm (in length). The       stenosis was traversed after downsizing scope to ultraslim scope with       outer diameter of 88mm.      The distal esophagus was normal.      The Z-line was regular and was found 36 cm from the incisors.      A 2 cm hiatal hernia was present.      The entire examined stomach was normal.      The duodenal bulb and second portion of the duodenum were normal. Impression:               - Normal hypopharynx.                           - Normal proximal esophagus.                           - Extrinsic narrowing of the esophagus.                           -  Normal distal esophagus.                           - Z-line regular, 36 cm from the incisors.                           - 2 cm hiatal hernia.                           - Normal stomach.                           - Normal duodenal bulb and second portion of the                            duodenum.                           - No specimens collected.                           Comment; suspect extrinsic compression due to                            metastatic lymphadenopathy. Moderate Sedation:      Per Anesthesia Care Recommendation:            - Perform CT scan (computed tomography) of the                            chest with contrast today.                           - Patient has a contact number available for                            emergencies. The signs and symptoms of potential                            delayed complications were discussed with the                            patient. Return to normal activities tomorrow.                            Written discharge instructions were provided to the                            patient.                           - Full liquid diet today.                           - Continue present medications.                           - Follow-up with Dr.Katragadda next  week. Procedure Code(s):        --- Professional ---                           [phone], Esophagogastroduodenoscopy, flexible,                            transoral; diagnostic, including collection of                            specimen(s) by brushing or washing, when performed                            (separate procedure) Diagnosis Code(s):        --- Professional ---                           K22.2, Esophageal obstruction                           K44.9, Diaphragmatic hernia without obstruction or                            gangrene                           R13.14, Dysphagia, pharyngoesophageal phase                           Z85.9, Personal history of malignant neoplasm,                            unspecified CPT copyright 2019 American Medical Association. All rights reserved. The codes documented in this report are preliminary and upon coder review may  be revised to meet current compliance requirements. Hildred Laser, MD Hildred Laser, MD 03/04/2020 2:50:22 PM This report has been signed electronically. Number of Addenda: 0

## 2020-03-04 NOTE — H&P (Signed)
Lindsey Mullins is an 74 y.o. female.   Chief Complaint: Patient is here for esophagogastroduodenoscopy with esophageal dilation. HPI: Patient is 74 year old African-American female who was diagnosed with metastatic lung carcinoma and June last year who underwent 4 cycles of carboplatin, paclitaxel pembrolizumab and now on maintenance with pembrolizumab who reports dysphagia of 2 months duration.  It has been gradually getting worse.  She points over surgery site of bolus obstruction.  She has had occasions where she had to regurgitate food in order to get relief.  Dysphagia has been progressive and now she also has difficulty with liquids.  She receives IV fluids by oncology clinic 2 days ago.  She was treated empirically treated by Dr. Delton Coombes with Diflucan but did not respond.  She says her appetite is good but she is not able to eat.  She has lost more than 10 pounds in the last 2 months.  She denies hematemesis melena or abdominal pain.  She has chronic low back pain secondary to metastatic disease. She is on omeprazole for chronic GERD and feels heartburn is well controlled with therapy.  Past Medical History:  Diagnosis Date  . Atypical chest pain 05/23/2012   STRESS TEST - small to moderate sized area of partial reversibility of the anteroapical wall, most likely breast artifact, post-stress EF 67%, EKG show NSR at 65, No Lexiscan EKG changes, non-diagnostic for ischemia; STRESS TEST, 01/25/2010 - normal study, post-stress EF 66%, no significant ischemia  . Cancer ([place])   . Cerebral atherosclerosis 09/29/2012   CAROTID DUPLEX - RIGHT  BULB/PROXIMAL ICA-moderate amount of fibrous plaque, 50-69% diameter reduction; LEFT CEA-normal, no significant diameter reduction  . Colitis   . GERD (gastroesophageal reflux disease)   . Hyperlipidemia   . Hypertension   . Osteopenia   . Restless leg syndrome   . Shortness of breath 03/30/2005   2D ECHO - EF >55%, normal  . TIA (transient ischemic attack)  01/27/2010   2D ECHO - EF 65%, normal    Past Surgical History:  Procedure Laterality Date  . ABDOMINAL EXPLORATION SURGERY    . CAROTID ENDARTERECTOMY    . LYMPH NODE BIOPSY N/A 05/20/2019   Procedure: LYMPH NODE BIOPSY CERVICAL;  Surgeon: Aviva Signs, MD;  Location: [facility];  Service: General;  Laterality: N/A;  . Peripheral Vascular Catheterization  07/16/2005   Right verterbral-50% smooth segmental badsilar artery stenosis on right side, Right carotid-50% ulcerated proxmial stenosis, Left carotid-60 to 70% left externam carotid artery stenosis, 90% proximal left ICA stenosis  . PORTACATH PLACEMENT Left 06/12/2019   Procedure: INSERTION PORT-A-CATH (catheter attached left subclavian);  Surgeon: Aviva Signs, MD;  Location: [facility];  Service: General;  Laterality: Left;    Family History  Problem Relation Age of Onset  . Heart disease Mother   . Hypertension Mother   . Hypertension Father   . Heart disease Father    Social History:  reports that she quit smoking about 36 years ago. Her smoking use included cigarettes. She has a 2.50 pack-year smoking history. She has never used smokeless tobacco. She reports that she does not drink alcohol or use drugs.  Allergies:  Allergies  Allergen Reactions  . Penicillins Shortness Of Breath and Other (See Comments)    Has patient had a PCN reaction causing immediate rash, facial/tongue/throat swelling, SOB or lightheadedness with hypotension: Yes Has patient had a PCN reaction causing severe rash involving mucus membranes or skin necrosis: Unk Has patient had a PCN reaction that required hospitalization:  Unk Has patient had a PCN reaction occurring within the last 10 years: Yes "States that she was in ICU after being administered"   . Codeine Nausea And Vomiting  . Lipitor [Atorvastatin] Other (See Comments)    Myalgias  . Lisinopril Cough  . Pravastatin Other (See Comments)    Myalgias    Medications Prior to Admission  Medication Sig  Dispense Refill  . ciprofloxacin (CIPRO) 250 MG tablet Take 1 tablet (250 mg total) by mouth 2 (two) times daily. 10 tablet 0  . HYDROmorphone (DILAUDID) 2 MG tablet Take 0.5 tablets (1 mg total) by mouth every 4 (four) hours as needed for severe pain. 30 tablet 0  . levothyroxine (SYNTHROID) 50 MCG tablet Take 50 mcg by mouth daily before breakfast.    . losartan (COZAAR) 50 MG tablet Take 25 mg by mouth daily.    . rosuvastatin (CRESTOR) 20 MG tablet Take 20 mg by mouth daily.    [name] gabapentin (NEURONTIN) 100 MG capsule Take 2 capsules (200 mg total) by mouth at bedtime. (Patient not taking: Reported on 03/02/2020) 60 capsule 1  . nitroGLYCERIN (NITROSTAT) 0.4 MG SL tablet DISSOLVE ONE TABLET UNDER THE TONGUE AS NEEDED FOR CHEST PAIN**MAX 3 TABLETS EACH 5 MINUTES APART** 25 tablet 3  . omeprazole (PRILOSEC) 20 MG capsule Take 1 po daily (Patient taking differently: Take 20 mg by mouth daily. ) 90 capsule 2  . polyethylene glycol powder (GLYCOLAX/MIRALAX) 17 GM/SCOOP powder Take 17 g by mouth 2 (two) times daily. Until daily soft stools  OTC (Patient not taking: Reported on 03/02/2020) 255 g 0    Results for orders placed or performed during the hospital encounter of 03/03/20 (from the past 48 hour(s))  SARS CORONAVIRUS 2 (TAT 6-24 HRS) Nasopharyngeal Nasopharyngeal Swab     Status: None   Collection Time: 03/03/20  7:12 AM   Specimen: Nasopharyngeal Swab  Result Value Ref Range   SARS Coronavirus 2 NEGATIVE NEGATIVE    Comment: (NOTE) SARS-CoV-2 target nucleic acids are NOT DETECTED. The SARS-CoV-2 RNA is generally detectable in upper and lower respiratory specimens during the acute phase of infection. Negative results do not preclude SARS-CoV-2 infection, do not rule out co-infections with other pathogens, and should not be used as the sole basis for treatment or other patient management decisions. Negative results must be combined with clinical observations, patient history, and  epidemiological information. The expected result is Negative. Fact Sheet for Patients: SugarRoll.be Fact Sheet for Healthcare Providers: https://www.woods-mathews.com/ This test is not yet approved or cleared by the [place] FDA and  has been authorized for detection and/or diagnosis of SARS-CoV-2 by FDA under an Emergency Use Authorization (EUA). This EUA will remain  in effect (meaning this test can be used) for the duration of the COVID-19 declaration under Section 56 4(b)(1) of the Act, 21 U.S.C. section 360bbb-3(b)(1), unless the authorization is terminated or revoked sooner. Performed at [facility], [place] [address], [place], [place] [address]    No results found.  Review of Systems  Blood pressure (!) 150/90, pulse 86, temperature (!) 97.5 F (36.4 C), temperature source Oral, resp. rate 14, SpO2 96 %. Physical Exam  Constitutional: She appears well-developed and well-nourished.  HENT:  Mouth/Throat: Oropharynx is clear and moist.  Patient has upper and lower dentures.  Eyes: No scleral icterus.  Neck: No thyromegaly present.  Cardiovascular: Normal rate, regular rhythm and normal heart sounds.  No murmur heard. Respiratory: Effort normal and breath sounds normal.  GI: Soft. She exhibits no distension and no mass. There is no abdominal tenderness.  Musculoskeletal:        General: No edema.  Lymphadenopathy:    She has no cervical adenopathy.  Neurological: She is alert.  Skin: Skin is warm and dry.     Assessment/Plan 2 months history of esophageal dysphagia in a patient with chronic GERD maintained on PPI who is on maintenance therapy with pembrolizumab.  She has not responded to empiric course of Diflucan.  Presentation is worrisome. Differential diagnoses at include stricture secondary to GERD and other malignancy or atypical infection. Esophagogastroduodenoscopy with esophageal dilation.  Hildred Laser,  MD 03/04/2020, 1:25 PM

## 2020-03-04 NOTE — Transfer of Care (Signed)
Immediate Anesthesia Transfer of Care Note  Patient: Lindsey Mullins  Procedure(s) Performed: ESOPHAGOGASTRODUODENOSCOPY (EGD) WITH PROPOFOL (N/A )  Patient Location: PACU  Anesthesia Type:General  Level of Consciousness: awake, alert , oriented and patient cooperative  Airway & Oxygen Therapy: Patient Spontanous Breathing  Post-op Assessment: Report given to RN and Post -op Vital signs reviewed and stable  Post vital signs: Reviewed and stable  Last Vitals:  Vitals Value Taken Time  BP    Temp    Pulse    Resp    SpO2      Last Pain:  Vitals:   03/04/20 1401  TempSrc:   PainSc: 7       Patients Stated Pain Goal: 5 (83/15/17 6160)  Complications: No apparent anesthesia complications

## 2020-03-04 NOTE — Anesthesia Preprocedure Evaluation (Signed)
Anesthesia Evaluation  Patient identified by MRN, date of birth, ID band Patient awake    Reviewed: Allergy & Precautions, NPO status , Patient's Chart, lab work & pertinent test results, reviewed documented beta blocker date and time   History of Anesthesia Complications Negative for: history of anesthetic complications  Airway Mallampati: I  TM Distance: >3 FB Neck ROM: Full    Dental  (+) Upper Dentures, Lower Dentures   Pulmonary shortness of breath and with exertion, former smoker,    Pulmonary exam normal breath sounds clear to auscultation       Cardiovascular METS (severe back and hip pain, limited mobility): hypertension, Pt. on medications Normal cardiovascular examI Rhythm:Regular Rate:Normal     Neuro/Psych PSYCHIATRIC DISORDERS TIA   GI/Hepatic Neg liver ROS, GERD  Medicated and Controlled,  Endo/Other  negative endocrine ROSHypothyroidism   Renal/GU negative Renal ROS  negative genitourinary   Musculoskeletal  (+) Arthritis , Osteoarthritis,    Abdominal   Peds negative pediatric ROS (+)  Hematology negative hematology ROS (+)   Anesthesia Other Findings   Reproductive/Obstetrics negative OB ROS                             Anesthesia Physical  Anesthesia Plan  ASA: III  Anesthesia Plan: General   Post-op Pain Management:    Induction: Intravenous  PONV Risk Score and Plan: 3 and Propofol infusion, TIVA, Ondansetron and Treatment may vary due to age or medical condition  Airway Management Planned: Nasal Cannula, Simple Face Mask and Natural Airway  Additional Equipment:   Intra-op Plan:   Post-operative Plan:   Informed Consent: I have reviewed the patients History and Physical, chart, labs and discussed the procedure including the risks, benefits and alternatives for the proposed anesthesia with the patient or authorized representative who has indicated  his/her understanding and acceptance.       Plan Discussed with: CRNA  Anesthesia Plan Comments:         Anesthesia Quick Evaluation

## 2020-03-06 ENCOUNTER — Emergency Department (HOSPITAL_COMMUNITY): Payer: Medicare PPO

## 2020-03-06 ENCOUNTER — Encounter (HOSPITAL_COMMUNITY): Payer: Self-pay | Admitting: Emergency Medicine

## 2020-03-06 ENCOUNTER — Emergency Department (HOSPITAL_COMMUNITY)
Admission: EM | Admit: 2020-03-06 | Discharge: 2020-03-07 | Disposition: A | Discharge status: Home / Self Care | Payer: Medicare PPO | Source: Home / Self Care | Attending: Emergency Medicine | Admitting: Emergency Medicine

## 2020-03-06 ENCOUNTER — Other Ambulatory Visit: Payer: Self-pay

## 2020-03-06 DIAGNOSIS — Z8673 Personal history of transient ischemic attack (TIA), and cerebral infarction without residual deficits: Secondary | ICD-10-CM | POA: Diagnosis not present

## 2020-03-06 DIAGNOSIS — I10 Essential (primary) hypertension: Secondary | ICD-10-CM | POA: Diagnosis present

## 2020-03-06 DIAGNOSIS — R933 Abnormal findings on diagnostic imaging of other parts of digestive tract: Secondary | ICD-10-CM | POA: Diagnosis not present

## 2020-03-06 DIAGNOSIS — R918 Other nonspecific abnormal finding of lung field: Secondary | ICD-10-CM | POA: Diagnosis not present

## 2020-03-06 DIAGNOSIS — C7951 Secondary malignant neoplasm of bone: Secondary | ICD-10-CM | POA: Diagnosis present

## 2020-03-06 DIAGNOSIS — E876 Hypokalemia: Secondary | ICD-10-CM | POA: Diagnosis present

## 2020-03-06 DIAGNOSIS — E039 Hypothyroidism, unspecified: Secondary | ICD-10-CM | POA: Diagnosis present

## 2020-03-06 DIAGNOSIS — Z888 Allergy status to other drugs, medicaments and biological substances status: Secondary | ICD-10-CM | POA: Diagnosis not present

## 2020-03-06 DIAGNOSIS — Z6826 Body mass index (BMI) 26.0-26.9, adult: Secondary | ICD-10-CM | POA: Diagnosis not present

## 2020-03-06 DIAGNOSIS — R1084 Generalized abdominal pain: Secondary | ICD-10-CM | POA: Diagnosis not present

## 2020-03-06 DIAGNOSIS — D630 Anemia in neoplastic disease: Secondary | ICD-10-CM | POA: Diagnosis present

## 2020-03-06 DIAGNOSIS — R0902 Hypoxemia: Secondary | ICD-10-CM | POA: Diagnosis not present

## 2020-03-06 DIAGNOSIS — Z20822 Contact with and (suspected) exposure to covid-19: Secondary | ICD-10-CM | POA: Diagnosis present

## 2020-03-06 DIAGNOSIS — C3491 Malignant neoplasm of unspecified part of right bronchus or lung: Secondary | ICD-10-CM | POA: Diagnosis present

## 2020-03-06 DIAGNOSIS — J811 Chronic pulmonary edema: Secondary | ICD-10-CM | POA: Diagnosis not present

## 2020-03-06 DIAGNOSIS — C799 Secondary malignant neoplasm of unspecified site: Secondary | ICD-10-CM | POA: Diagnosis not present

## 2020-03-06 DIAGNOSIS — J9621 Acute and chronic respiratory failure with hypoxia: Secondary | ICD-10-CM | POA: Diagnosis not present

## 2020-03-06 DIAGNOSIS — R5381 Other malaise: Secondary | ICD-10-CM | POA: Diagnosis not present

## 2020-03-06 DIAGNOSIS — M545 Low back pain: Secondary | ICD-10-CM | POA: Diagnosis not present

## 2020-03-06 DIAGNOSIS — R627 Adult failure to thrive: Secondary | ICD-10-CM | POA: Diagnosis present

## 2020-03-06 DIAGNOSIS — K219 Gastro-esophageal reflux disease without esophagitis: Secondary | ICD-10-CM | POA: Diagnosis present

## 2020-03-06 DIAGNOSIS — C779 Secondary and unspecified malignant neoplasm of lymph node, unspecified: Secondary | ICD-10-CM | POA: Diagnosis present

## 2020-03-06 DIAGNOSIS — R079 Chest pain, unspecified: Secondary | ICD-10-CM | POA: Diagnosis not present

## 2020-03-06 DIAGNOSIS — J90 Pleural effusion, not elsewhere classified: Secondary | ICD-10-CM | POA: Diagnosis present

## 2020-03-06 DIAGNOSIS — K222 Esophageal obstruction: Secondary | ICD-10-CM | POA: Diagnosis present

## 2020-03-06 DIAGNOSIS — K5903 Drug induced constipation: Secondary | ICD-10-CM | POA: Diagnosis not present

## 2020-03-06 DIAGNOSIS — Z515 Encounter for palliative care: Secondary | ICD-10-CM | POA: Diagnosis not present

## 2020-03-06 DIAGNOSIS — R1013 Epigastric pain: Secondary | ICD-10-CM | POA: Diagnosis not present

## 2020-03-06 DIAGNOSIS — K5900 Constipation, unspecified: Secondary | ICD-10-CM | POA: Insufficient documentation

## 2020-03-06 DIAGNOSIS — M8580 Other specified disorders of bone density and structure, unspecified site: Secondary | ICD-10-CM | POA: Diagnosis present

## 2020-03-06 DIAGNOSIS — C787 Secondary malignant neoplasm of liver and intrahepatic bile duct: Secondary | ICD-10-CM | POA: Diagnosis present

## 2020-03-06 DIAGNOSIS — R0789 Other chest pain: Secondary | ICD-10-CM | POA: Diagnosis not present

## 2020-03-06 DIAGNOSIS — Z87891 Personal history of nicotine dependence: Secondary | ICD-10-CM | POA: Insufficient documentation

## 2020-03-06 DIAGNOSIS — Z880 Allergy status to penicillin: Secondary | ICD-10-CM | POA: Diagnosis not present

## 2020-03-06 DIAGNOSIS — M2550 Pain in unspecified joint: Secondary | ICD-10-CM | POA: Diagnosis not present

## 2020-03-06 DIAGNOSIS — E785 Hyperlipidemia, unspecified: Secondary | ICD-10-CM | POA: Diagnosis present

## 2020-03-06 DIAGNOSIS — Z03818 Encounter for observation for suspected exposure to other biological agents ruled out: Secondary | ICD-10-CM | POA: Diagnosis not present

## 2020-03-06 DIAGNOSIS — Y929 Unspecified place or not applicable: Secondary | ICD-10-CM | POA: Diagnosis not present

## 2020-03-06 DIAGNOSIS — Z79899 Other long term (current) drug therapy: Secondary | ICD-10-CM | POA: Insufficient documentation

## 2020-03-06 DIAGNOSIS — J9601 Acute respiratory failure with hypoxia: Secondary | ICD-10-CM | POA: Diagnosis not present

## 2020-03-06 DIAGNOSIS — Z7401 Bed confinement status: Secondary | ICD-10-CM | POA: Diagnosis not present

## 2020-03-06 DIAGNOSIS — R188 Other ascites: Secondary | ICD-10-CM | POA: Diagnosis not present

## 2020-03-06 DIAGNOSIS — Z95828 Presence of other vascular implants and grafts: Secondary | ICD-10-CM | POA: Diagnosis not present

## 2020-03-06 DIAGNOSIS — I672 Cerebral atherosclerosis: Secondary | ICD-10-CM | POA: Diagnosis present

## 2020-03-06 DIAGNOSIS — Z7189 Other specified counseling: Secondary | ICD-10-CM | POA: Diagnosis not present

## 2020-03-06 DIAGNOSIS — R180 Malignant ascites: Secondary | ICD-10-CM | POA: Diagnosis present

## 2020-03-06 DIAGNOSIS — C3490 Malignant neoplasm of unspecified part of unspecified bronchus or lung: Secondary | ICD-10-CM | POA: Diagnosis not present

## 2020-03-06 DIAGNOSIS — R1310 Dysphagia, unspecified: Secondary | ICD-10-CM | POA: Diagnosis not present

## 2020-03-06 DIAGNOSIS — Z885 Allergy status to narcotic agent status: Secondary | ICD-10-CM | POA: Diagnosis not present

## 2020-03-06 LAB — COMPREHENSIVE METABOLIC PANEL
ALT: 17 U/L (ref 0–44)
AST: 55 U/L — ABNORMAL HIGH (ref 15–41)
Albumin: 3.4 g/dL — ABNORMAL LOW (ref 3.5–5.0)
Alkaline Phosphatase: 126 U/L (ref 38–126)
Anion gap: 14 (ref 5–15)
BUN: 11 mg/dL (ref 8–23)
CO2: 22 mmol/L (ref 22–32)
Calcium: 8.6 mg/dL — ABNORMAL LOW (ref 8.9–10.3)
Chloride: 91 mmol/L — ABNORMAL LOW (ref 98–111)
Creatinine, Ser: 0.65 mg/dL (ref 0.44–1.00)
GFR calc Af Amer: >60 mL/min (ref >60)
GFR calc non Af Amer: >60 mL/min (ref >60)
Glucose, Bld: 92 mg/dL (ref 70–99)
Potassium: 4.6 mmol/L (ref 3.5–5.1)
Sodium: 127 mmol/L — ABNORMAL LOW (ref 135–145)
Total Bilirubin: 1.8 mg/dL — ABNORMAL HIGH (ref 0.3–1.2)
Total Protein: 7.5 g/dL (ref 6.5–8.1)

## 2020-03-06 LAB — CBC
HCT: 34.6 % — ABNORMAL LOW (ref 36.0–46.0)
Hemoglobin: 11 g/dL — ABNORMAL LOW (ref 12.0–15.0)
MCH: 30.7 pg (ref 26.0–34.0)
MCHC: 31.8 g/dL (ref 30.0–36.0)
MCV: 96.6 fL (ref 80.0–100.0)
Platelets: 304 10*3/uL (ref 150–400)
RBC: 3.58 MIL/uL — ABNORMAL LOW (ref 3.87–5.11)
RDW: 17.2 % — ABNORMAL HIGH (ref 11.5–15.5)
WBC: 6.6 10*3/uL (ref 4.0–10.5)
nRBC: 0 % (ref 0.0–0.2)

## 2020-03-06 LAB — LIPASE, BLOOD: Lipase: 36 U/L (ref 11–51)

## 2020-03-06 MED ORDER — HYDROMORPHONE HCL 1 MG/ML IJ SOLN
1.0000 mg | Freq: Once | INTRAMUSCULAR | Status: AC
  Administered 2020-03-06: 22:00:00 1 mg via INTRAVENOUS
  Filled 2020-03-06: qty 1

## 2020-03-06 MED ORDER — IOHEXOL 300 MG/ML  SOLN
100.0000 mL | Freq: Once | INTRAMUSCULAR | Status: AC | PRN
  Administered 2020-03-06: 22:00:00 100 mL via INTRAVENOUS

## 2020-03-06 MED ORDER — MINERAL OIL RE ENEM
1.0000 | ENEMA | Freq: Once | RECTAL | Status: AC
  Administered 2020-03-07: 1 via RECTAL
  Filled 2020-03-06: qty 1

## 2020-03-06 NOTE — ED Triage Notes (Signed)
Upper scope thursday by Dr Laural Golden   Told to stop her Miralax  Now with L abd pain since this am   Last BM three days ago a small amount  Family gave her 1/4 of a Dilaudid po 2 mg tablet less than 20 minutes ago

## 2020-03-06 NOTE — ED Notes (Signed)
X-ray at bedside

## 2020-03-06 NOTE — ED Provider Notes (Signed)
[facility] EMERGENCY DEPARTMENT Provider Note   CSN: [id number] Arrival date & time: 03/06/20  1919     History Chief Complaint  Patient presents with  . Abdominal Pain    Lindsey Mullins is a 74 y.o. female with a history of malignant neoplasm of the lung stage IV lung cancer, esophageal stricture secondary to lymph node metastases, presenting to the emergency department with abdominal pain.  The patient underwent an upper endoscopy 2 days ago on 4/16 with Dr Hildred Laser MD for chronic dysphagia and difficulty swallowing.  She was doing okay after her endoscopy.  However today she began having pain in her left lower abdomen.  She feels her abdomen is distended and bloated.  She has pain is new.  She has had no appetite and not been able to eat much.  Her daughter at the bedside reports the patient only had a very small bowel movement yesterday, but has not been having regular bowel movement because "she has not eaten anything in days".  Last "real" BM was about 1 week ago.  The patient does take p.o. Dilaudid for pain and has been on this for at least 3 weeks.  Patient does not believe that she is passing gas.  Denies any fevers or chills.  HPI     Past Medical History:  Diagnosis Date  . Atypical chest pain 05/23/2012   STRESS TEST - small to moderate sized area of partial reversibility of the anteroapical wall, most likely breast artifact, post-stress EF 67%, EKG show NSR at 65, No Lexiscan EKG changes, non-diagnostic for ischemia; STRESS TEST, 01/25/2010 - normal study, post-stress EF 66%, no significant ischemia  . Cancer ([place])   . Cerebral atherosclerosis 09/29/2012   CAROTID DUPLEX - RIGHT  BULB/PROXIMAL ICA-moderate amount of fibrous plaque, 50-69% diameter reduction; LEFT CEA-normal, no significant diameter reduction  . Colitis   . GERD (gastroesophageal reflux disease)   . Hyperlipidemia   . Hypertension   . Osteopenia   . Restless leg syndrome   . Shortness of breath  03/30/2005   2D ECHO - EF >55%, normal  . TIA (transient ischemic attack) 01/27/2010   2D ECHO - EF 65%, normal    Patient Active Problem List   Diagnosis Date Noted  . Hypothyroidism 01/26/2020  . Peripheral edema 09/11/2019  . Malignant neoplasm of right lung ([place])   . Goals of care, counseling/discussion 06/10/2019  . Squamous cell carcinoma of lung, stage IV, right ([place]) 05/28/2019  . Metastatic cancer ([place]) 05/26/2019  . Cervical lymphadenopathy   . Compression fracture of L2 ([place]) 04/29/2019  . Arteriosclerosis, mesenteric artery ([place]) 12/31/2018  . Abdominal pain 12/22/2018  . Colitis 12/22/2018  . Osteopenia   . DDD (degenerative disc disease), lumbar 05/11/2016  . Obesity 05/11/2016  . Glucose intolerance (impaired glucose tolerance) 01/12/2016  . Pain in the chest   . Chest pain 01/10/2016  . GERD (gastroesophageal reflux disease) 01/26/2015  . Stress and adjustment reaction 01/26/2015  . Carotid artery disease ([place]) 04/20/2013  . Bunion, left 09/21/2012  . Overweight(278.02) 06/04/2012  . Essential hypertension, benign 04/02/2012  . Hyperlipidemia 04/02/2012  . Dysphagia 04/02/2012  . RLS (restless legs syndrome) 04/02/2012    Past Surgical History:  Procedure Laterality Date  . ABDOMINAL EXPLORATION SURGERY    . CAROTID ENDARTERECTOMY    . LYMPH NODE BIOPSY N/A 05/20/2019   Procedure: LYMPH NODE BIOPSY CERVICAL;  Surgeon: Aviva Signs, MD;  Location: [facility];  Service: General;  Laterality: N/A;  .  Peripheral Vascular Catheterization  07/16/2005   Right verterbral-50% smooth segmental badsilar artery stenosis on right side, Right carotid-50% ulcerated proxmial stenosis, Left carotid-60 to 70% left externam carotid artery stenosis, 90% proximal left ICA stenosis  . PORTACATH PLACEMENT Left 06/12/2019   Procedure: INSERTION PORT-A-CATH (catheter attached left subclavian);  Surgeon: Aviva Signs, MD;  Location: [facility];  Service: General;  Laterality: Left;     OB  History    Gravida  2   Para  2   Term  2   Preterm      AB      Living        SAB      TAB      Ectopic      Multiple      Live Births              Family History  Problem Relation Age of Onset  . Heart disease Mother   . Hypertension Mother   . Hypertension Father   . Heart disease Father     Social History   Tobacco Use  . Smoking status: Former Smoker    Packs/day: 0.25    Years: 10.00    Pack years: 2.50    Types: Cigarettes    Quit date: 04/21/1983    Years since quitting: 36.9  . Smokeless tobacco: Never Used  Substance Use Topics  . Alcohol use: No  . Drug use: No    Home Medications Prior to Admission medications   Medication Sig Start Date End Date Taking? Authorizing Provider  ciprofloxacin (CIPRO) 250 MG tablet Take 1 tablet (250 mg total) by mouth 2 (two) times daily. 03/01/20  Yes [place], Modena Nunnery, MD  HYDROmorphone (DILAUDID) 2 MG tablet Take 0.5 tablets (1 mg total) by mouth every 4 (four) hours as needed for severe pain. 03/03/20  Yes Derek Jack, MD  levothyroxine (SYNTHROID) 50 MCG tablet Take 50 mcg by mouth daily before breakfast.   Yes [provider]  losartan (COZAAR) 50 MG tablet Take 25 mg by mouth daily.   Yes [provider]  nitroGLYCERIN (NITROSTAT) 0.4 MG SL tablet DISSOLVE ONE TABLET UNDER THE TONGUE AS NEEDED FOR CHEST PAIN**MAX 3 TABLETS EACH 5 MINUTES APART** 09/14/16  Yes [place], Modena Nunnery, MD  omeprazole (PRILOSEC) 20 MG capsule Take 1 po daily Patient taking differently: Take 20 mg by mouth daily.  12/31/18  Yes [place], Modena Nunnery, MD  rosuvastatin (CRESTOR) 20 MG tablet Take 20 mg by mouth daily.   Yes [provider]    Allergies    Penicillins, Codeine, Lipitor [atorvastatin], Lisinopril, and Pravastatin  Review of Systems   Review of Systems  Constitutional: Negative for chills and fever.  HENT: Positive for trouble swallowing. Negative for mouth sores.   Respiratory:  Positive for shortness of breath. Negative for cough.   Cardiovascular: Negative for chest pain and palpitations.  Gastrointestinal: Positive for abdominal pain, constipation and nausea. Negative for diarrhea and vomiting.  Genitourinary: Negative for dysuria and hematuria.  Musculoskeletal: Negative for arthralgias and back pain.  Skin: Negative for color change and rash.  Neurological: Positive for light-headedness. Negative for syncope.  All other systems reviewed and are negative.   Physical Exam Updated Vital Signs BP 112/85   Pulse 84   Temp 98.1 F (36.7 C) (Oral)   Resp 18   Ht 5\' 7"  (1.702 m)   Wt 73.5 kg   SpO2 98%   BMI 25.37 kg/m   Physical  Exam Vitals and nursing note reviewed.  Constitutional:      Comments: Thin, frail  HENT:     Head: Normocephalic and atraumatic.  Eyes:     Conjunctiva/sclera: Conjunctivae normal.  Cardiovascular:     Rate and Rhythm: Normal rate and regular rhythm.  Pulmonary:     Effort: Pulmonary effort is normal. No respiratory distress.     Breath sounds: Normal breath sounds.  Abdominal:     Palpations: Abdomen is soft.     Comments: Mild degree of abdominal distension without rigidity or peritonitic signs Mild left upper and left lower quadrant tenderness No rebound  Musculoskeletal:     Cervical back: Neck supple.  Skin:    General: Skin is warm and dry.  Neurological:     General: No focal deficit present.     Mental Status: She is alert and oriented to person, place, and time.  Psychiatric:        Mood and Affect: Mood normal.        Behavior: Behavior normal.     ED Results / Procedures / Treatments   Labs (all labs ordered are listed, but only abnormal results are displayed) Labs Reviewed  COMPREHENSIVE METABOLIC PANEL - Abnormal; Notable for the following components:      Result Value   Sodium 127 (*)    Chloride 91 (*)    Calcium 8.6 (*)    Albumin 3.4 (*)    AST 55 (*)    Total Bilirubin 1.8 (*)    All  other components within normal limits  CBC - Abnormal; Notable for the following components:   RBC 3.58 (*)    Hemoglobin 11.0 (*)    HCT 34.6 (*)    RDW 17.2 (*)    All other components within normal limits  LIPASE, BLOOD    EKG None  Radiology CT ABDOMEN PELVIS W CONTRAST  Result Date: 03/06/2020 CLINICAL DATA:  Abdominal distension. Status post EGD 2 days ago. Pain. EXAM: CT ABDOMEN AND PELVIS WITH CONTRAST TECHNIQUE: Multidetector CT imaging of the abdomen and pelvis was performed using the standard protocol following bolus administration of intravenous contrast. CONTRAST:  186mL OMNIPAQUE IOHEXOL 300 MG/ML  SOLN COMPARISON:  Chest CT 2 days ago 03/04/2020. Abdominal CT 10/26/2019 FINDINGS: Lower chest: Right pleural effusion is unchanged from recent CT. Bandlike consolidation in the right lower lobe is unchanged. Heart is normal in size. There are coronary artery calcifications. Small pericardial effusion measuring up to 8 mm. Distal esophageal wall thickening. No paraesophageal stranding. No extraluminal air. Hepatobiliary: Multiple hypodense hepatic lesions consistent with metastatic disease. Largest lesion in the right lobe measures 18 mm. Distended gallbladder without calcified gallstone. Common bile duct is not well-defined, no evidence of biliary dilatation. Pancreas: Parenchymal atrophy. No ductal dilatation or inflammation. Spleen: Irregular hypodense lesion in the inferior spleen measures 2.5 cm, increased in size from prior. No splenomegaly. Adrenals/Urinary Tract: Normal right adrenal gland. Minimal left adrenal thickening without dominant nodule. No hydronephrosis or perinephric edema. Homogeneous renal enhancement with symmetric excretion on delayed phase imaging. Scattered cortical scarring in the right kidney. Urinary bladder is physiologically distended without wall thickening. Mild high density of the urine in the urinary bladder may be related to some residual excretion of IV  contrast from recent chest CT. Stomach/Bowel: Mild wall thickening of the distal esophagus. No adjacent inflammation or extraluminal air. The stomach is nondistended. No small bowel obstruction, small bowel is decompressed. The appendix is not definitively visualized, no evidence of  appendicitis. Large volume of stool in the ascending and proximal transverse colon. Transverse colon is tortuous. Distal transverse and descending colon are decompressed. No colonic wall thickening. Vascular/Lymphatic: Moderate aorto bi-iliac atherosclerosis. Portal caval adenopathy, including 11 mm node series 2, image 32, new from prior abdominal CT. Increased number of multiple small lower retroperitoneal nodes, largest measures 8 mm, nonspecific. 8 mm right external iliac/inguinal node is nonspecific. Portal vein is patent. Reproductive: Multiple calcified uterine fibroids. Ascites limits assessment for adnexal mass. Ovaries tentatively visualized and quiescent. Other: Moderate volume abdominopelvic ascites. There is omental caking in the left upper quadrant and anterior abdomen most prominent just below the umbilicus, series 2, image 61. No free air. Musculoskeletal: Sclerotic osseous metastatic disease throughout the lumbar spine. Chronic compression deformity superior endplate of L2. Prominent Schmorl's node inferior endplate of L3. Patchy sclerosis involving the left proximal femur and bony pelvis. IMPRESSION: 1. No evidence of perforation post EGD.  No bowel obstruction. 2. Moderate volume abdominopelvic ascites. There is omental caking in the left upper quadrant and anterior abdomen. This is new from in December 2020 abdominal CT. 3. Increased size of hepatic metastatic disease. Increased size of hypodense splenic lesion suspicious for metastasis. Increased size of periportal and retroperitoneal nodes, thickening of the left adrenal gland, nonspecific but increased from prior exam. 4. Sclerotic osseous metastatic disease  throughout the lumbar spine and bony pelvis. 5. Moderate stool in the ascending and proximal transverse colon. 6. Right pleural effusion is unchanged from recent chest CT 2 days ago. Bandlike consolidation in the right lower lobe is unchanged. Aortic Atherosclerosis (ICD10-I70.0). Electronically Signed   By: Keith Rake M.D.   On: 03/06/2020 22:54   DG Chest Portable 1 View  Result Date: 03/06/2020 CLINICAL DATA:  Epigastric pain. EGD 2 days ago. Evaluate for free air under the diaphragm. EXAM: PORTABLE CHEST 1 VIEW COMPARISON:  Chest CT 2 days ago 03/04/2020 FINDINGS: No evidence of free air under the hemidiaphragms. No visualized pneumomediastinum. Left chest port remains in place, tip in the SVC. No pneumothorax. Pleural effusion and right basilar opacity similar to recent chest CT. Normal heart size with unchanged mediastinal contours. Aortic atherosclerosis. No pulmonary edema. IMPRESSION: 1. No evidence of free air under the hemidiaphragms. No visualized pneumomediastinum. 2. Right basilar opacity and pleural effusion similar to recent chest CT. Electronically Signed   By: Keith Rake M.D.   On: 03/06/2020 21:27   DG Abd Portable 1 View  Result Date: 03/06/2020 CLINICAL DATA:  Evaluate for free air. Status post EGD 2 days ago with epigastric pain. EXAM: PORTABLE ABDOMEN - 1 VIEW COMPARISON:  None. FINDINGS: Portable AP upright view of the abdomen. No free air under the hemidiaphragms. No bowel dilatation to suggest obstruction. Moderate stool in the right colon. IMPRESSION: No free air under the hemidiaphragms. Nonobstructive bowel gas pattern. Electronically Signed   By: Keith Rake M.D.   On: 03/06/2020 21:28    Procedures Procedures (including critical care time)  Medications Ordered in ED Medications  HYDROmorphone (DILAUDID) injection 1 mg (1 mg Intravenous Given 03/06/20 2137)  iohexol (OMNIPAQUE) 300 MG/ML solution 100 mL (100 mLs Intravenous Contrast Given 03/06/20 2227)    mineral oil enema 1 enema (1 enema Rectal Given 03/07/20 0025)  magnesium citrate solution 1 Bottle (1 Bottle Oral Given 03/07/20 0145)    ED Course  I have reviewed the triage vital signs and the nursing notes.  Pertinent labs & imaging results that were available during my care of the patient  were reviewed by me and considered in my medical decision making (see chart for details).  74 yo female w/ stage 4 metastatic squamous cell carcinoma of the lung, who underwent EGD 2 days ago presenting to the ED with abdominal pain, distension, poor PO intake. She has diffuse sharp abdominal pains and some mild distension on exam.  She is frail, and per her daughter's report has been in progressive decline for quite some time, with diminishing PO intake (basically fluids only now) due to esophageal external compression from lymph node metastasis.  Chart reviewed with EGD report from 2 days prior, as well as oncology notes.   Immediate assessment and priority is to determine whether she may have a post-endoscopic complication such as a small perforation or bowel obstruction.  Also on the differential is constipation and ileus given her recent opioid induction and reported poor BM.  For screening I ordered upright xrays which did not show free air under the diaphragm, per my interpretation.  I advised another CT scan to better evaluate her bowels, which she and her daughter agreed with.  I personally ordered and reviewed her blood tests, which showed no evidence of acute dehydration, no AKI.  CBC with normal WBC and hgb at baseline at 11.  Based on these labs and her clinical presentation, I felt this was much less likely to be an acute mesenteric ischemia or intraabdominal vascular injury at this time.  CT scan subsequently showed significant stool burden in the colon.  I explained that I felt her symptoms were related to constipation, possibly opioid-induced.  It appears that she's stopped her regular bowel  regimen (it used to be sennakot and miralax on alternating days, but her daughter felt this wasn't necessary at "she was barely eating anything anymore.")  I advised resuming miralax (this is fluid, she can drink it), and an enema may be beneficial.  They wished to try an enema here, which I offered to do.  If this is unsuccessful, we will discharge her back home with magnesium citrate.  I do think this would be manageable at home.  At the time of my signout to the overnight provider, the patient was pending her enema.     Final Clinical Impression(s) / ED Diagnoses Final diagnoses:  Generalized abdominal pain  Drug-induced constipation    Rx / DC Orders ED Discharge Orders    None       Wyvonnia Dusky, MD 03/07/20 1153

## 2020-03-07 MED ORDER — MAGNESIUM CITRATE PO SOLN
1.0000 | Freq: Once | ORAL | Status: AC
  Administered 2020-03-07: 1 via ORAL
  Filled 2020-03-07: qty 296

## 2020-03-07 MED ORDER — NALOXEGOL OXALATE 25 MG PO TABS: 25.0000 mg | ORAL_TABLET | Freq: Every day | ORAL | 0 refills | Status: AC

## 2020-03-07 NOTE — Addendum Note (Signed)
Addended by: Derek Jack on: 03/07/2020 02:19 PM   Modules accepted: Orders

## 2020-03-08 ENCOUNTER — Inpatient Hospital Stay (HOSPITAL_COMMUNITY): Payer: Medicare PPO

## 2020-03-08 ENCOUNTER — Encounter (HOSPITAL_COMMUNITY): Payer: Self-pay

## 2020-03-08 ENCOUNTER — Inpatient Hospital Stay (HOSPITAL_COMMUNITY)
Admission: EM | Admit: 2020-03-08 | Discharge: 2020-03-18 | DRG: 180 | Disposition: A | Discharge status: Home Health | Payer: Medicare PPO | Source: Ambulatory Visit | Attending: Internal Medicine | Admitting: Internal Medicine

## 2020-03-08 ENCOUNTER — Encounter (HOSPITAL_COMMUNITY): Payer: Self-pay | Admitting: *Deleted

## 2020-03-08 ENCOUNTER — Other Ambulatory Visit: Payer: Self-pay

## 2020-03-08 DIAGNOSIS — C799 Secondary malignant neoplasm of unspecified site: Secondary | ICD-10-CM

## 2020-03-08 DIAGNOSIS — K5903 Drug induced constipation: Secondary | ICD-10-CM | POA: Diagnosis present

## 2020-03-08 DIAGNOSIS — Z515 Encounter for palliative care: Secondary | ICD-10-CM

## 2020-03-08 DIAGNOSIS — Z8673 Personal history of transient ischemic attack (TIA), and cerebral infarction without residual deficits: Secondary | ICD-10-CM

## 2020-03-08 DIAGNOSIS — E039 Hypothyroidism, unspecified: Secondary | ICD-10-CM | POA: Diagnosis present

## 2020-03-08 DIAGNOSIS — I10 Essential (primary) hypertension: Secondary | ICD-10-CM | POA: Diagnosis present

## 2020-03-08 DIAGNOSIS — R079 Chest pain, unspecified: Secondary | ICD-10-CM

## 2020-03-08 DIAGNOSIS — Z87891 Personal history of nicotine dependence: Secondary | ICD-10-CM

## 2020-03-08 DIAGNOSIS — G2581 Restless legs syndrome: Secondary | ICD-10-CM | POA: Diagnosis present

## 2020-03-08 DIAGNOSIS — Z888 Allergy status to other drugs, medicaments and biological substances status: Secondary | ICD-10-CM

## 2020-03-08 DIAGNOSIS — C787 Secondary malignant neoplasm of liver and intrahepatic bile duct: Secondary | ICD-10-CM | POA: Diagnosis present

## 2020-03-08 DIAGNOSIS — R627 Adult failure to thrive: Secondary | ICD-10-CM | POA: Diagnosis present

## 2020-03-08 DIAGNOSIS — M545 Low back pain, unspecified: Secondary | ICD-10-CM

## 2020-03-08 DIAGNOSIS — Z6826 Body mass index (BMI) 26.0-26.9, adult: Secondary | ICD-10-CM

## 2020-03-08 DIAGNOSIS — R0902 Hypoxemia: Secondary | ICD-10-CM

## 2020-03-08 DIAGNOSIS — M1990 Unspecified osteoarthritis, unspecified site: Secondary | ICD-10-CM | POA: Diagnosis present

## 2020-03-08 DIAGNOSIS — E785 Hyperlipidemia, unspecified: Secondary | ICD-10-CM | POA: Diagnosis present

## 2020-03-08 DIAGNOSIS — I739 Peripheral vascular disease, unspecified: Secondary | ICD-10-CM | POA: Diagnosis present

## 2020-03-08 DIAGNOSIS — T402X5A Adverse effect of other opioids, initial encounter: Secondary | ICD-10-CM | POA: Diagnosis present

## 2020-03-08 DIAGNOSIS — R1010 Upper abdominal pain, unspecified: Secondary | ICD-10-CM

## 2020-03-08 DIAGNOSIS — R6251 Failure to thrive (child): Principal | ICD-10-CM | POA: Diagnosis present

## 2020-03-08 DIAGNOSIS — D630 Anemia in neoplastic disease: Secondary | ICD-10-CM | POA: Diagnosis present

## 2020-03-08 DIAGNOSIS — M8580 Other specified disorders of bone density and structure, unspecified site: Secondary | ICD-10-CM | POA: Diagnosis present

## 2020-03-08 DIAGNOSIS — R180 Malignant ascites: Secondary | ICD-10-CM | POA: Diagnosis present

## 2020-03-08 DIAGNOSIS — K219 Gastro-esophageal reflux disease without esophagitis: Secondary | ICD-10-CM | POA: Diagnosis present

## 2020-03-08 DIAGNOSIS — G8929 Other chronic pain: Secondary | ICD-10-CM | POA: Diagnosis present

## 2020-03-08 DIAGNOSIS — R1310 Dysphagia, unspecified: Secondary | ICD-10-CM

## 2020-03-08 DIAGNOSIS — E876 Hypokalemia: Secondary | ICD-10-CM | POA: Diagnosis present

## 2020-03-08 DIAGNOSIS — K222 Esophageal obstruction: Secondary | ICD-10-CM

## 2020-03-08 DIAGNOSIS — Z79890 Hormone replacement therapy (postmenopausal): Secondary | ICD-10-CM

## 2020-03-08 DIAGNOSIS — Z79899 Other long term (current) drug therapy: Secondary | ICD-10-CM

## 2020-03-08 DIAGNOSIS — Z8249 Family history of ischemic heart disease and other diseases of the circulatory system: Secondary | ICD-10-CM

## 2020-03-08 DIAGNOSIS — T40605A Adverse effect of unspecified narcotics, initial encounter: Secondary | ICD-10-CM | POA: Diagnosis present

## 2020-03-08 DIAGNOSIS — C3491 Malignant neoplasm of unspecified part of right bronchus or lung: Principal | ICD-10-CM | POA: Diagnosis present

## 2020-03-08 DIAGNOSIS — Z880 Allergy status to penicillin: Secondary | ICD-10-CM

## 2020-03-08 DIAGNOSIS — D7389 Other diseases of spleen: Secondary | ICD-10-CM | POA: Diagnosis present

## 2020-03-08 DIAGNOSIS — Z20822 Contact with and (suspected) exposure to covid-19: Secondary | ICD-10-CM | POA: Diagnosis present

## 2020-03-08 DIAGNOSIS — I672 Cerebral atherosclerosis: Secondary | ICD-10-CM | POA: Diagnosis present

## 2020-03-08 DIAGNOSIS — J90 Pleural effusion, not elsewhere classified: Secondary | ICD-10-CM | POA: Diagnosis present

## 2020-03-08 DIAGNOSIS — C7951 Secondary malignant neoplasm of bone: Secondary | ICD-10-CM | POA: Diagnosis present

## 2020-03-08 DIAGNOSIS — F419 Anxiety disorder, unspecified: Secondary | ICD-10-CM | POA: Diagnosis present

## 2020-03-08 DIAGNOSIS — K449 Diaphragmatic hernia without obstruction or gangrene: Secondary | ICD-10-CM | POA: Diagnosis present

## 2020-03-08 DIAGNOSIS — Z885 Allergy status to narcotic agent status: Secondary | ICD-10-CM

## 2020-03-08 DIAGNOSIS — C779 Secondary and unspecified malignant neoplasm of lymph node, unspecified: Secondary | ICD-10-CM | POA: Diagnosis present

## 2020-03-08 DIAGNOSIS — J9621 Acute and chronic respiratory failure with hypoxia: Secondary | ICD-10-CM | POA: Diagnosis not present

## 2020-03-08 DIAGNOSIS — Y929 Unspecified place or not applicable: Secondary | ICD-10-CM

## 2020-03-08 LAB — CBC WITH DIFFERENTIAL/PLATELET
Abs Immature Granulocytes: 0.05 10*3/uL (ref 0.00–0.07)
Basophils Absolute: 0.1 10*3/uL (ref 0.0–0.1)
Basophils Relative: 1 %
Eosinophils Absolute: 0 10*3/uL (ref 0.0–0.5)
Eosinophils Relative: 0 %
HCT: 31.6 % — ABNORMAL LOW (ref 36.0–46.0)
Hemoglobin: 10.2 g/dL — ABNORMAL LOW (ref 12.0–15.0)
Immature Granulocytes: 1 %
Lymphocytes Relative: 13 %
Lymphs Abs: 0.9 10*3/uL (ref 0.7–4.0)
MCH: 31 pg (ref 26.0–34.0)
MCHC: 32.3 g/dL (ref 30.0–36.0)
MCV: 96 fL (ref 80.0–100.0)
Monocytes Absolute: 0.8 10*3/uL (ref 0.1–1.0)
Monocytes Relative: 11 %
Neutro Abs: 5.3 10*3/uL (ref 1.7–7.7)
Neutrophils Relative %: 74 %
Platelets: 345 10*3/uL (ref 150–400)
RBC: 3.29 MIL/uL — ABNORMAL LOW (ref 3.87–5.11)
RDW: 16.8 % — ABNORMAL HIGH (ref 11.5–15.5)
WBC: 7.1 10*3/uL (ref 4.0–10.5)
nRBC: 0 % (ref 0.0–0.2)

## 2020-03-08 LAB — URINALYSIS, ROUTINE W REFLEX MICROSCOPIC
Bilirubin Urine: NEGATIVE
Glucose, UA: NEGATIVE mg/dL
Hgb urine dipstick: NEGATIVE
Ketones, ur: 20 mg/dL — AB
Leukocytes,Ua: NEGATIVE
Nitrite: NEGATIVE
Protein, ur: 30 mg/dL — AB
Specific Gravity, Urine: 1.028 (ref 1.005–1.030)
pH: 5 (ref 5.0–8.0)

## 2020-03-08 LAB — COMPREHENSIVE METABOLIC PANEL
ALT: 15 U/L (ref 0–44)
AST: 51 U/L — ABNORMAL HIGH (ref 15–41)
Albumin: 3.4 g/dL — ABNORMAL LOW (ref 3.5–5.0)
Alkaline Phosphatase: 136 U/L — ABNORMAL HIGH (ref 38–126)
Anion gap: 14 (ref 5–15)
BUN: 11 mg/dL (ref 8–23)
CO2: 26 mmol/L (ref 22–32)
Calcium: 9 mg/dL (ref 8.9–10.3)
Chloride: 91 mmol/L — ABNORMAL LOW (ref 98–111)
Creatinine, Ser: 0.67 mg/dL (ref 0.44–1.00)
GFR calc Af Amer: >60 mL/min (ref >60)
GFR calc non Af Amer: >60 mL/min (ref >60)
Glucose, Bld: 88 mg/dL (ref 70–99)
Potassium: 4.5 mmol/L (ref 3.5–5.1)
Sodium: 131 mmol/L — ABNORMAL LOW (ref 135–145)
Total Bilirubin: 1.3 mg/dL — ABNORMAL HIGH (ref 0.3–1.2)
Total Protein: 7.5 g/dL (ref 6.5–8.1)

## 2020-03-08 LAB — SARS CORONAVIRUS 2 (TAT 6-24 HRS): SARS Coronavirus 2: NEGATIVE

## 2020-03-08 LAB — MAGNESIUM: Magnesium: 2.1 mg/dL (ref 1.7–2.4)

## 2020-03-08 MED ORDER — PANTOPRAZOLE SODIUM 40 MG IV SOLR
40.0000 mg | INTRAVENOUS | Status: DC
  Administered 2020-03-08 – 2020-03-16 (×9): 40 mg via INTRAVENOUS
  Filled 2020-03-08 (×9): qty 40

## 2020-03-08 MED ORDER — PANTOPRAZOLE SODIUM 40 MG PO TBEC
40.0000 mg | DELAYED_RELEASE_TABLET | Freq: Every day | ORAL | Status: DC
  Administered 2020-03-08: 40 mg via ORAL
  Filled 2020-03-08: qty 1

## 2020-03-08 MED ORDER — ONDANSETRON HCL 4 MG/2ML IJ SOLN: 4.0000 mg | Freq: Four times a day (QID) | INTRAMUSCULAR | Status: DC | PRN

## 2020-03-08 MED ORDER — ENSURE ENLIVE PO LIQD: 237.0000 mL | Freq: Two times a day (BID) | ORAL | Status: DC

## 2020-03-08 MED ORDER — ACETAMINOPHEN 325 MG PO TABS: 650.0000 mg | ORAL_TABLET | Freq: Four times a day (QID) | ORAL | Status: DC | PRN

## 2020-03-08 MED ORDER — POLYETHYLENE GLYCOL 3350 17 G PO PACK
17.0000 g | PACK | Freq: Every day | ORAL | Status: DC | PRN
  Administered 2020-03-08: 17 g via ORAL
  Filled 2020-03-08: qty 1

## 2020-03-08 MED ORDER — ONDANSETRON HCL 4 MG PO TABS: 4.0000 mg | ORAL_TABLET | Freq: Four times a day (QID) | ORAL | Status: DC | PRN

## 2020-03-08 MED ORDER — ACETAMINOPHEN 650 MG RE SUPP: 650.0000 mg | Freq: Four times a day (QID) | RECTAL | Status: DC | PRN

## 2020-03-08 MED ORDER — FOSFOMYCIN TROMETHAMINE 3 G PO PACK
3.0000 g | PACK | Freq: Once | ORAL | Status: AC
  Administered 2020-03-08: 3 g via ORAL
  Filled 2020-03-08: qty 3

## 2020-03-08 MED ORDER — SENNOSIDES-DOCUSATE SODIUM 8.6-50 MG PO TABS
1.0000 | ORAL_TABLET | Freq: Once | ORAL | Status: AC
  Administered 2020-03-08: 1 via ORAL
  Filled 2020-03-08: qty 1

## 2020-03-08 MED ORDER — LACTATED RINGERS IV SOLN: INTRAVENOUS | Status: DC

## 2020-03-08 MED ORDER — BOOST / RESOURCE BREEZE PO LIQD CUSTOM
1.0000 | Freq: Three times a day (TID) | ORAL | Status: DC
  Administered 2020-03-09 – 2020-03-18 (×16): 1 via ORAL

## 2020-03-08 MED ORDER — POLYETHYLENE GLYCOL 3350 17 G PO PACK
17.0000 g | PACK | Freq: Two times a day (BID) | ORAL | Status: DC
  Administered 2020-03-09 – 2020-03-18 (×18): 17 g via ORAL
  Filled 2020-03-08 (×19): qty 1

## 2020-03-08 MED ORDER — SENNOSIDES-DOCUSATE SODIUM 8.6-50 MG PO TABS
1.0000 | ORAL_TABLET | Freq: Two times a day (BID) | ORAL | Status: DC
  Administered 2020-03-09 – 2020-03-18 (×17): 1 via ORAL
  Filled 2020-03-08 (×19): qty 1

## 2020-03-08 MED ORDER — DEXTROSE-NACL 5-0.9 % IV SOLN: INTRAVENOUS | Status: DC

## 2020-03-08 MED ORDER — CIPROFLOXACIN HCL 250 MG PO TABS: 250.0000 mg | ORAL_TABLET | Freq: Two times a day (BID) | ORAL | Status: DC

## 2020-03-08 MED ORDER — LABETALOL HCL 5 MG/ML IV SOLN
5.0000 mg | INTRAVENOUS | Status: DC | PRN
  Administered 2020-03-15 – 2020-03-16 (×2): 5 mg via INTRAVENOUS
  Filled 2020-03-08 (×2): qty 4

## 2020-03-08 MED ORDER — HYDROMORPHONE HCL 1 MG/ML PO LIQD: 0.5000 mg | ORAL | Status: DC | PRN

## 2020-03-08 MED ORDER — LEVOTHYROXINE SODIUM 50 MCG PO TABS
50.0000 ug | ORAL_TABLET | Freq: Every day | ORAL | Status: DC
  Administered 2020-03-10 – 2020-03-11 (×2): 50 ug via ORAL
  Filled 2020-03-08 (×2): qty 1

## 2020-03-08 MED ORDER — ENOXAPARIN SODIUM 40 MG/0.4ML ~~LOC~~ SOLN
40.0000 mg | SUBCUTANEOUS | Status: DC
  Administered 2020-03-08 – 2020-03-12 (×4): 40 mg via SUBCUTANEOUS
  Filled 2020-03-08 (×5): qty 0.4

## 2020-03-08 MED ORDER — NALOXEGOL OXALATE 25 MG PO TABS
25.0000 mg | ORAL_TABLET | Freq: Every day | ORAL | Status: DC
  Administered 2020-03-09 – 2020-03-18 (×8): 25 mg via ORAL
  Filled 2020-03-08 (×11): qty 1

## 2020-03-08 MED ORDER — POLYETHYLENE GLYCOL 3350 17 G PO PACK
34.0000 g | PACK | Freq: Once | ORAL | Status: AC
  Administered 2020-03-08: 34 g via ORAL
  Filled 2020-03-08: qty 2

## 2020-03-08 MED ORDER — HYDROMORPHONE HCL 1 MG/ML IJ SOLN: 1.0000 mg | INTRAMUSCULAR | Status: DC | PRN

## 2020-03-08 MED ORDER — HYDROMORPHONE HCL 1 MG/ML IJ SOLN
0.5000 mg | INTRAMUSCULAR | Status: DC | PRN
  Administered 2020-03-09 (×2): 0.5 mg via INTRAVENOUS
  Filled 2020-03-08 (×2): qty 0.5

## 2020-03-08 MED ORDER — MAGNESIUM CITRATE PO SOLN: 1.0000 | Freq: Once | ORAL | Status: DC

## 2020-03-08 MED ORDER — MORPHINE SULFATE (CONCENTRATE) 10 MG/0.5ML PO SOLN: 2.0000 mg | ORAL | Status: DC | PRN

## 2020-03-08 NOTE — ED Triage Notes (Signed)
Pt to er room number 44, per report pt is from the cancer center and sent down for constipation, states that they have tried several treatments without relief.  Pt in bed with eyes closed, pt awake and answering questions appropriately, pt reports abd and back pain.  Pt states that her last bm was today, but it was very small.

## 2020-03-08 NOTE — ED Notes (Signed)
Soap suds enema preformed, pt tolerated will, pt has moderate amount of soft brown stool out.

## 2020-03-08 NOTE — H&P (Addendum)
History and Physical    Lindsey Mullins [id number]:[id number] DOB: 1946-11-05 DOA: 03/08/2020  PCP: [name], MD   Patient coming from: Home  I have personally briefly reviewed patient's old medical records in [place]  Chief Complaint: Weakness,   HPI: Lindsey Mullins is a 74 y.o. female with medical history significant for metastatic lung cancer, hypertension hypothyroidism.  Patient was sent to the ED from the cancer center with reports of generalized weakness, abdominal and back pain and constipation.  Patient was in the ED 2 days ago 4/18 for similar problems, abdominal CT was without evidence of perforation or bowel obstruction, but showed increase in size of hepatic metastatic disease, showed moderate stool burden in ascending and proximal transverse colon.  She was discharged home with a bowel regimen to include magnesium citrate.   Patient also called her oncologist, and was prescribed Movantik.  Patient last bowel movement was this morning, which was a small amount. Daughter at bedside who helps with the history and who takes care of patient sudden decline, patient only able to take very thin liquids, plain Ensure.  Unable to walk without assistance.  Generally weak.  Minimal exertion results in difficulty breathing and wheezing.  Any minimal movement aggravates the pain in her back. Daughter, [name] at bedside is the only caregiver for now.  ED Course: Stable vitals.  Sodium 131, UA rare bacteria 6-10 WBCs.  Soapsuds enema given with results-moderate amount of soft stool.  Hospitalist admit for failure to thrive, and that patient may require placement.  Review of Systems: As per HPI all other systems reviewed and negative.  Past Medical History:  Diagnosis Date  . Atypical chest pain 05/23/2012   STRESS TEST - small to moderate sized area of partial reversibility of the anteroapical wall, most likely breast artifact, post-stress EF 67%, EKG show NSR at 65, No Lexiscan  EKG changes, non-diagnostic for ischemia; STRESS TEST, 01/25/2010 - normal study, post-stress EF 66%, no significant ischemia  . Cancer ([place])   . Cerebral atherosclerosis 09/29/2012   CAROTID DUPLEX - RIGHT  BULB/PROXIMAL ICA-moderate amount of fibrous plaque, 50-69% diameter reduction; LEFT CEA-normal, no significant diameter reduction  . Colitis   . GERD (gastroesophageal reflux disease)   . Hyperlipidemia   . Hypertension   . Osteopenia   . Restless leg syndrome   . Shortness of breath 03/30/2005   2D ECHO - EF >55%, normal  . TIA (transient ischemic attack) 01/27/2010   2D ECHO - EF 65%, normal    Past Surgical History:  Procedure Laterality Date  . ABDOMINAL EXPLORATION SURGERY    . CAROTID ENDARTERECTOMY    . LYMPH NODE BIOPSY N/A 05/20/2019   Procedure: LYMPH NODE BIOPSY CERVICAL;  Surgeon: Aviva Signs, MD;  Location: [facility];  Service: General;  Laterality: N/A;  . Peripheral Vascular Catheterization  07/16/2005   Right verterbral-50% smooth segmental badsilar artery stenosis on right side, Right carotid-50% ulcerated proxmial stenosis, Left carotid-60 to 70% left externam carotid artery stenosis, 90% proximal left ICA stenosis  . PORTACATH PLACEMENT Left 06/12/2019   Procedure: INSERTION PORT-A-CATH (catheter attached left subclavian);  Surgeon: Aviva Signs, MD;  Location: [facility];  Service: General;  Laterality: Left;     reports that she quit smoking about 36 years ago. Her smoking use included cigarettes. She has a 2.50 pack-year smoking history. She has never used smokeless tobacco. She reports that she does not drink alcohol or use drugs.  Allergies  Allergen Reactions  .  Penicillins Shortness Of Breath and Other (See Comments)    Has patient had a PCN reaction causing immediate rash, facial/tongue/throat swelling, SOB or lightheadedness with hypotension: Yes Has patient had a PCN reaction causing severe rash involving mucus membranes or skin necrosis: Unk Has patient had a  PCN reaction that required hospitalization: Unk Has patient had a PCN reaction occurring within the last 10 years: Yes "States that she was in ICU after being administered"   . Codeine Nausea And Vomiting  . Lipitor [Atorvastatin] Other (See Comments)    Myalgias  . Lisinopril Cough  . Pravastatin Other (See Comments)    Myalgias    Family History  Problem Relation Age of Onset  . Heart disease Mother   . Hypertension Mother   . Hypertension Father   . Heart disease Father     Prior to Admission medications   Medication Sig Start Date End Date Taking? Authorizing Provider  ciprofloxacin (CIPRO) 250 MG tablet Take 1 tablet (250 mg total) by mouth 2 (two) times daily. 03/01/20  Yes [place], Modena Nunnery, MD  HYDROmorphone (DILAUDID) 2 MG tablet Take 0.5 tablets (1 mg total) by mouth every 4 (four) hours as needed for severe pain. 03/03/20  Yes Derek Jack, MD  levothyroxine (SYNTHROID) 50 MCG tablet Take 50 mcg by mouth daily before breakfast.   Yes [provider]  naloxegol oxalate (MOVANTIK) 25 MG TABS tablet Take 1 tablet (25 mg total) by mouth daily. 03/07/20  Yes Derek Jack, MD  nitroGLYCERIN (NITROSTAT) 0.4 MG SL tablet DISSOLVE ONE TABLET UNDER THE TONGUE AS NEEDED FOR CHEST PAIN**MAX 3 TABLETS EACH 5 MINUTES APART** 09/14/16  Yes [place], Modena Nunnery, MD  omeprazole (PRILOSEC) 20 MG capsule Take 1 po daily Patient taking differently: Take 20 mg by mouth daily.  12/31/18  Yes [place], Modena Nunnery, MD  losartan (COZAAR) 50 MG tablet Take 25 mg by mouth daily.    [provider]  rosuvastatin (CRESTOR) 20 MG tablet Take 20 mg by mouth daily.    [provider]    Physical Exam: Vitals:   03/08/20 1137 03/08/20 1138 03/08/20 1139 03/08/20 1434  BP: (!) 135/94 (!) 135/94  (!) 144/91  Pulse: 95 96  95  Resp: 15 16  18   Temp: (!) 97.5 F (36.4 C) (!) 97.5 F (36.4 C)  97.9 F (36.6 C)  TempSrc: Oral Oral  Oral  SpO2: 95% 96%  95%  Weight:    73.5 kg   Height:   5\' 6"  (1.676 m)     Constitutional: Chronically ill-appearing, talking in low tones due to weakness and not wanting to aggravate back pain. Vitals:   03/08/20 1137 03/08/20 1138 03/08/20 1139 03/08/20 1434  BP: (!) 135/94 (!) 135/94  (!) 144/91  Pulse: 95 96  95  Resp: 15 16  18   Temp: (!) 97.5 F (36.4 C) (!) 97.5 F (36.4 C)  97.9 F (36.6 C)  TempSrc: Oral Oral  Oral  SpO2: 95% 96%  95%  Weight:   73.5 kg   Height:   5\' 6"  (1.676 m)    Eyes: PERRL, lids and conjunctivae normal ENMT: Mucous membranes are mildly dry Neck: normal, supple Respiratory:Normal respiratory effort. No accessory muscle use.  Cardiovascular: Regular rate and rhythm,  No extremity edema. 2+ pedal pulses. Abdomen: Abdomen mildly distended, no tenderness, no masses palpated. No hepatosplenomegaly.   Musculoskeletal: no clubbing / cyanosis. No joint deformity upper and lower extremities. Good ROM, no contractures. Normal muscle tone.  Skin: no rashes, lesions, ulcers. No induration Neurologic: No apparent cranial nerve abnormality, able to move all extremities spontaneously. Psychiatric: Normal judgment and insight. Alert and oriented x 3.  Affect flat, depressed  Labs on Admission: I have personally reviewed following labs and imaging studies  CBC: Recent Labs  Lab 03/02/20 0922 03/06/20 2137 03/08/20 1356  WBC 6.0 6.6 7.1  NEUTROABS 4.0  --  5.3  HGB 11.0* 11.0* 10.2*  HCT 34.4* 34.6* 31.6*  MCV 96.4 96.6 96.0  PLT 235 304 932   Basic Metabolic Panel: Recent Labs  Lab 03/02/20 0922 03/06/20 2137 03/08/20 1356  NA 130* 127* 131*  K 3.6 4.6 4.5  CL 91* 91* 91*  CO2 24 22 26   GLUCOSE 80 92 88  BUN 12 11 11   CREATININE 0.68 0.65 0.67  CALCIUM 9.3 8.6* 9.0   Liver Function Tests: Recent Labs  Lab 03/02/20 0922 03/06/20 2137 03/08/20 1356  AST 43* 55* 51*  ALT 15 17 15   ALKPHOS 134* 126 136*  BILITOT 1.3* 1.8* 1.3*  PROT 7.7 7.5 7.5  ALBUMIN 3.8 3.4* 3.4*    Recent Labs  Lab 03/06/20 2137  LIPASE 36   Urine analysis:    Component Value Date/Time   COLORURINE YELLOW 03/08/2020 1415   APPEARANCEUR CLEAR 03/08/2020 1415   LABSPEC 1.028 03/08/2020 1415   PHURINE 5.0 03/08/2020 1415   GLUCOSEU NEGATIVE 03/08/2020 1415   HGBUR NEGATIVE 03/08/2020 1415   BILIRUBINUR NEGATIVE 03/08/2020 1415   KETONESUR 20 (A) 03/08/2020 1415   PROTEINUR 30 (A) 03/08/2020 1415   UROBILINOGEN 0.2 11/30/2013 0811   NITRITE NEGATIVE 03/08/2020 1415   LEUKOCYTESUR NEGATIVE 03/08/2020 1415    Radiological Exams on Admission: CT ABDOMEN PELVIS W CONTRAST  Result Date: 03/06/2020 CLINICAL DATA:  Abdominal distension. Status post EGD 2 days ago. Pain. EXAM: CT ABDOMEN AND PELVIS WITH CONTRAST TECHNIQUE: Multidetector CT imaging of the abdomen and pelvis was performed using the standard protocol following bolus administration of intravenous contrast. CONTRAST:  131mL OMNIPAQUE IOHEXOL 300 MG/ML  SOLN COMPARISON:  Chest CT 2 days ago 03/04/2020. Abdominal CT 10/26/2019 FINDINGS: Lower chest: Right pleural effusion is unchanged from recent CT. Bandlike consolidation in the right lower lobe is unchanged. Heart is normal in size. There are coronary artery calcifications. Small pericardial effusion measuring up to 8 mm. Distal esophageal wall thickening. No paraesophageal stranding. No extraluminal air. Hepatobiliary: Multiple hypodense hepatic lesions consistent with metastatic disease. Largest lesion in the right lobe measures 18 mm. Distended gallbladder without calcified gallstone. Common bile duct is not well-defined, no evidence of biliary dilatation. Pancreas: Parenchymal atrophy. No ductal dilatation or inflammation. Spleen: Irregular hypodense lesion in the inferior spleen measures 2.5 cm, increased in size from prior. No splenomegaly. Adrenals/Urinary Tract: Normal right adrenal gland. Minimal left adrenal thickening without dominant nodule. No hydronephrosis or  perinephric edema. Homogeneous renal enhancement with symmetric excretion on delayed phase imaging. Scattered cortical scarring in the right kidney. Urinary bladder is physiologically distended without wall thickening. Mild high density of the urine in the urinary bladder may be related to some residual excretion of IV contrast from recent chest CT. Stomach/Bowel: Mild wall thickening of the distal esophagus. No adjacent inflammation or extraluminal air. The stomach is nondistended. No small bowel obstruction, small bowel is decompressed. The appendix is not definitively visualized, no evidence of appendicitis. Large volume of stool in the ascending and proximal transverse colon. Transverse colon is tortuous. Distal transverse and descending colon are decompressed. No colonic wall thickening.  Vascular/Lymphatic: Moderate aorto bi-iliac atherosclerosis. Portal caval adenopathy, including 11 mm node series 2, image 32, new from prior abdominal CT. Increased number of multiple small lower retroperitoneal nodes, largest measures 8 mm, nonspecific. 8 mm right external iliac/inguinal node is nonspecific. Portal vein is patent. Reproductive: Multiple calcified uterine fibroids. Ascites limits assessment for adnexal mass. Ovaries tentatively visualized and quiescent. Other: Moderate volume abdominopelvic ascites. There is omental caking in the left upper quadrant and anterior abdomen most prominent just below the umbilicus, series 2, image 61. No free air. Musculoskeletal: Sclerotic osseous metastatic disease throughout the lumbar spine. Chronic compression deformity superior endplate of L2. Prominent Schmorl's node inferior endplate of L3. Patchy sclerosis involving the left proximal femur and bony pelvis. IMPRESSION: 1. No evidence of perforation post EGD.  No bowel obstruction. 2. Moderate volume abdominopelvic ascites. There is omental caking in the left upper quadrant and anterior abdomen. This is new from in December  2020 abdominal CT. 3. Increased size of hepatic metastatic disease. Increased size of hypodense splenic lesion suspicious for metastasis. Increased size of periportal and retroperitoneal nodes, thickening of the left adrenal gland, nonspecific but increased from prior exam. 4. Sclerotic osseous metastatic disease throughout the lumbar spine and bony pelvis. 5. Moderate stool in the ascending and proximal transverse colon. 6. Right pleural effusion is unchanged from recent chest CT 2 days ago. Bandlike consolidation in the right lower lobe is unchanged. Aortic Atherosclerosis (ICD10-I70.0). Electronically Signed   By: Keith Rake M.D.   On: 03/06/2020 22:54   DG Chest Portable 1 View  Result Date: 03/06/2020 CLINICAL DATA:  Epigastric pain. EGD 2 days ago. Evaluate for free air under the diaphragm. EXAM: PORTABLE CHEST 1 VIEW COMPARISON:  Chest CT 2 days ago 03/04/2020 FINDINGS: No evidence of free air under the hemidiaphragms. No visualized pneumomediastinum. Left chest port remains in place, tip in the SVC. No pneumothorax. Pleural effusion and right basilar opacity similar to recent chest CT. Normal heart size with unchanged mediastinal contours. Aortic atherosclerosis. No pulmonary edema. IMPRESSION: 1. No evidence of free air under the hemidiaphragms. No visualized pneumomediastinum. 2. Right basilar opacity and pleural effusion similar to recent chest CT. Electronically Signed   By: Keith Rake M.D.   On: 03/06/2020 21:27   DG Abd Portable 1 View  Result Date: 03/06/2020 CLINICAL DATA:  Evaluate for free air. Status post EGD 2 days ago with epigastric pain. EXAM: PORTABLE ABDOMEN - 1 VIEW COMPARISON:  None. FINDINGS: Portable AP upright view of the abdomen. No free air under the hemidiaphragms. No bowel dilatation to suggest obstruction. Moderate stool in the right colon. IMPRESSION: No free air under the hemidiaphragms. Nonobstructive bowel gas pattern. Electronically Signed   By: Keith Rake M.D.   On: 03/06/2020 21:28    EKG: none.  Assessment/Plan Principal Problem:   Failure to thrive (0-17) Active Problems:   Squamous cell carcinoma of lung, stage IV, right (HCC)   Malignant neoplasm of right lung (HCC)  Failure to thrive-due to metastatic lung cancer, progressing despite chemotherapy, currently on immunotherapy.  Unable to eat due to the dysphagia from extrinsic esophageal stenosis.  Generally weak, exertion with minimal activity.  Unable to ambulate without assistance. -Continue plain Ensure 4 times daily ( No milk products, has intolerance). -Palliative care consult -PT evaluation - May need to consider PEG tube.  Constipation-response to soapsuds enema in ED.  Recent abdominal CT 4/18 - for obstruction or bowel perforation.  Constipation likely opioid induced. -Continue Movantik -Magnesium citrate  x1, MiraLAX twice daily, Senokot twice daily  Hyponatremia-131, likely due to poor p.o. intake - D5 N/s 75 cc/hr   Metastatic lung cancer-recent abdominal imaging 4/18 shows increasing size of hepatic and splenic metastatic disease.  She also has an anterior epidural tumor at T5, and involvement of several other vertebrae with metastatic disease.  Follows with Dr. Delton Coombes.  Per recent notes, patient is currently on chemotherapy, but due to back pain she was unable to get radiation therapy as she was unable to lie.  Also had difficulty swallowing thick liquids and solids, and 7 pound weight loss in the past 3 weeks.  She was referred to Dr. Laural Golden, and subsequently had an EGD done on 4/16 which showed severe extrinsic stenosis in the esophagus, likely due to metastatic lymphadenopathy.  Subsequent follow-up CT chest as recommended 4/16 - Significant progression of adenopathy especially in the bilateral neck, Possible invasion of the esophageal wall by tumor in the mid to distal esophagus (Pls see detailed report). -Palliative care consult -Daughter at bedside,  wants patient to be full code, and at this time wants to continue all options for treatment including chemotherapy. -Morphine for pain, home dose of Dilaudid 0.5 mg and liquid Dilaudid not available.  Recent UTI- prescribed a 5 day course of ciprofloxacin by primary care provider 4/13.  Has been taking 1 instead of 2 doses daily due to acute and ongoing issues over the past several days so unable to tell how many more days she has left.  At this time patient denies urinary symptoms. -Recent EKG 4/18 QTC at 570, will complete course of antibiotic with fosfomycin 1 dose x1.    Hypertension- -not currently taking losartan, and not resumed for now -IV labetalol 5 mg as needed   DVT prophylaxis: Lovenox Code Status: Full code Family Communication: Daughter Emergency planning/management officer at bedside, is patient's Scientist, research (life sciences).  Patient has no signed documents designating [place] POA.  I have explained to daughter that patient's condition is likely to decline even further. Disposition Plan:  1 - 2 days, patient may need placement. Consults called: None Admission status: Observation, MedSurg.   Bethena Roys MD [facility]  03/08/2020, 5:14 PM

## 2020-03-08 NOTE — Progress Notes (Signed)
Pt presents to clinic today for injection.  Pt reports severe generalized weakness, decreased po intake, abdominal pain and back pain.  The back pain is the pain she normally has r/t metastatic disease.  She reports it has been "days" since she had a bowel movement.  She went to the ED 2 days ago for the same (abd pain and constipation) and was given an enema and mag citrate to take at home.  Dr. Delton Coombes also called in [place] to her pharmacy yesterday.  Pt reports she took the Movantik but only had a "very small" bowel movement after that.  She is sitting in the waiting room resting her head on her daughter's arm with eyes closed, moaning in pain. The above was shared with Dr. Delton Coombes - he advises sending pt to the ED for further evaluation of weakness, intractable pain, and constipation. I advised pt and her daughter, Lindsey Mullins, of the recommendation from Dr. Delton Coombes to present to the ED, and they are agreeable to this plan.  Report called to Magda Paganini, RN ED charge.  Pt transported to ED Rm 11 via wheelchair.

## 2020-03-08 NOTE — ED Notes (Signed)
Pt in bed, pt reports decreased pain, pt states that her pain is a 7/10, pt has no requests at this time, family at bedside.

## 2020-03-09 ENCOUNTER — Ambulatory Visit: Payer: Medicare PPO | Admitting: Nurse Practitioner

## 2020-03-09 DIAGNOSIS — I672 Cerebral atherosclerosis: Secondary | ICD-10-CM | POA: Diagnosis present

## 2020-03-09 DIAGNOSIS — R627 Adult failure to thrive: Secondary | ICD-10-CM

## 2020-03-09 DIAGNOSIS — Z880 Allergy status to penicillin: Secondary | ICD-10-CM | POA: Diagnosis not present

## 2020-03-09 DIAGNOSIS — C779 Secondary and unspecified malignant neoplasm of lymph node, unspecified: Secondary | ICD-10-CM | POA: Diagnosis present

## 2020-03-09 DIAGNOSIS — C3491 Malignant neoplasm of unspecified part of right bronchus or lung: Secondary | ICD-10-CM | POA: Diagnosis present

## 2020-03-09 DIAGNOSIS — E039 Hypothyroidism, unspecified: Secondary | ICD-10-CM | POA: Diagnosis present

## 2020-03-09 DIAGNOSIS — R180 Malignant ascites: Secondary | ICD-10-CM | POA: Diagnosis present

## 2020-03-09 DIAGNOSIS — Z7189 Other specified counseling: Secondary | ICD-10-CM | POA: Diagnosis not present

## 2020-03-09 DIAGNOSIS — C7951 Secondary malignant neoplasm of bone: Secondary | ICD-10-CM | POA: Diagnosis present

## 2020-03-09 DIAGNOSIS — Z885 Allergy status to narcotic agent status: Secondary | ICD-10-CM | POA: Diagnosis not present

## 2020-03-09 DIAGNOSIS — Z8673 Personal history of transient ischemic attack (TIA), and cerebral infarction without residual deficits: Secondary | ICD-10-CM | POA: Diagnosis not present

## 2020-03-09 DIAGNOSIS — C787 Secondary malignant neoplasm of liver and intrahepatic bile duct: Secondary | ICD-10-CM | POA: Diagnosis present

## 2020-03-09 DIAGNOSIS — E876 Hypokalemia: Secondary | ICD-10-CM | POA: Diagnosis present

## 2020-03-09 DIAGNOSIS — C799 Secondary malignant neoplasm of unspecified site: Secondary | ICD-10-CM | POA: Diagnosis not present

## 2020-03-09 DIAGNOSIS — M8580 Other specified disorders of bone density and structure, unspecified site: Secondary | ICD-10-CM | POA: Diagnosis present

## 2020-03-09 DIAGNOSIS — J90 Pleural effusion, not elsewhere classified: Secondary | ICD-10-CM | POA: Diagnosis present

## 2020-03-09 DIAGNOSIS — E785 Hyperlipidemia, unspecified: Secondary | ICD-10-CM | POA: Diagnosis present

## 2020-03-09 DIAGNOSIS — R933 Abnormal findings on diagnostic imaging of other parts of digestive tract: Secondary | ICD-10-CM | POA: Diagnosis not present

## 2020-03-09 DIAGNOSIS — R1310 Dysphagia, unspecified: Secondary | ICD-10-CM

## 2020-03-09 DIAGNOSIS — Y929 Unspecified place or not applicable: Secondary | ICD-10-CM | POA: Diagnosis not present

## 2020-03-09 DIAGNOSIS — M545 Low back pain, unspecified: Secondary | ICD-10-CM

## 2020-03-09 DIAGNOSIS — D630 Anemia in neoplastic disease: Secondary | ICD-10-CM | POA: Diagnosis present

## 2020-03-09 DIAGNOSIS — Z515 Encounter for palliative care: Secondary | ICD-10-CM

## 2020-03-09 DIAGNOSIS — Z20822 Contact with and (suspected) exposure to covid-19: Secondary | ICD-10-CM | POA: Diagnosis present

## 2020-03-09 DIAGNOSIS — Z6826 Body mass index (BMI) 26.0-26.9, adult: Secondary | ICD-10-CM | POA: Diagnosis not present

## 2020-03-09 DIAGNOSIS — R6251 Failure to thrive (child): Secondary | ICD-10-CM | POA: Diagnosis not present

## 2020-03-09 DIAGNOSIS — K222 Esophageal obstruction: Secondary | ICD-10-CM | POA: Diagnosis present

## 2020-03-09 DIAGNOSIS — K219 Gastro-esophageal reflux disease without esophagitis: Secondary | ICD-10-CM | POA: Diagnosis present

## 2020-03-09 DIAGNOSIS — I10 Essential (primary) hypertension: Secondary | ICD-10-CM | POA: Diagnosis present

## 2020-03-09 DIAGNOSIS — R0902 Hypoxemia: Secondary | ICD-10-CM | POA: Diagnosis not present

## 2020-03-09 DIAGNOSIS — J9621 Acute and chronic respiratory failure with hypoxia: Secondary | ICD-10-CM | POA: Diagnosis not present

## 2020-03-09 DIAGNOSIS — R0789 Other chest pain: Secondary | ICD-10-CM | POA: Diagnosis not present

## 2020-03-09 DIAGNOSIS — Z888 Allergy status to other drugs, medicaments and biological substances status: Secondary | ICD-10-CM | POA: Diagnosis not present

## 2020-03-09 DIAGNOSIS — J9601 Acute respiratory failure with hypoxia: Secondary | ICD-10-CM | POA: Diagnosis not present

## 2020-03-09 DIAGNOSIS — R1013 Epigastric pain: Secondary | ICD-10-CM | POA: Diagnosis not present

## 2020-03-09 MED ORDER — HYDROMORPHONE HCL 1 MG/ML PO LIQD: 0.5000 mg | ORAL | Status: DC | PRN

## 2020-03-09 MED ORDER — ONDANSETRON HCL 4 MG/2ML IJ SOLN: 4.0000 mg | Freq: Four times a day (QID) | INTRAMUSCULAR | Status: DC | PRN

## 2020-03-09 MED ORDER — ONDANSETRON HCL 4 MG/2ML IJ SOLN
4.0000 mg | Freq: Once | INTRAMUSCULAR | Status: AC
  Administered 2020-03-09: 4 mg via INTRAVENOUS
  Filled 2020-03-09: qty 2

## 2020-03-09 MED ORDER — HYDROMORPHONE HCL 1 MG/ML IJ SOLN
0.5000 mg | INTRAMUSCULAR | Status: DC | PRN
  Administered 2020-03-10 (×2): 0.5 mg via INTRAVENOUS
  Filled 2020-03-09 (×2): qty 0.5

## 2020-03-09 MED ORDER — HYDROMORPHONE HCL 1 MG/ML PO LIQD: 0.5000 mg | Freq: Four times a day (QID) | ORAL | Status: DC | PRN

## 2020-03-09 MED ORDER — KATE FARMS STANDARD 1.4 PO LIQD
325.0000 mL | ORAL | Status: DC
  Administered 2020-03-10 – 2020-03-11 (×2): 325 mL via ORAL
  Filled 2020-03-09 (×8): qty 325

## 2020-03-09 NOTE — Consult Note (Signed)
[facility] Consultation Oncology  Name: Lindsey Mullins      MRN: [id number]    Location: B638/G536-46  Date: 03/09/2020 Time:7:38 PM   REFERRING PHYSICIAN: Dr. Manuella Ghazi  REASON FOR CONSULT: Metastatic lung cancer.   DIAGNOSIS: Severe constipation and weakness.  HISTORY OF PRESENT ILLNESS: Lindsey Mullins is a 74 year old very pleasant female known to me from office visits.  She had metastatic squamous cell carcinoma of the lung diagnosed in June 2020.  She had received 4 cycles of chemotherapy from July through end of September and has been receiving immunotherapy.  She has been continuing immunotherapy since her scans in December 2020 showed stable disease.  However she has developed difficulty swallowing and lost some weight.  She also developed severe low back pain and was referred for radiation therapy.  She could not start radiation as she was unable to lie on the table.  She was not able to take pain medication as she is very sensitive to them.  She underwent EGD on 03/04/2020 which showed extrinsic compression with narrowing of the esophagus at the level of carina.  Since then she has been on liquid diet.  However she presented to the ER with severe constipation.  She had CT scans done of the abdomen and pelvis which showed progressive metastatic disease in the left omentum and liver.  There was also ascites.  She went back home on Monday morning but did not have any bowel movement.  We started her on Movantik which did not help.  She came back to ER on Tuesday and was subsequently admitted to the hospital.  She reportedly had bowel movement yesterday and is feeling slightly better with regards to the abdominal pain.  She had received Dilaudid this morning and was confused.  She is slowly improving from it.  PAST MEDICAL HISTORY:   Past Medical History:  Diagnosis Date  . Atypical chest pain 05/23/2012   STRESS TEST - small to moderate sized area of partial reversibility of the anteroapical  wall, most likely breast artifact, post-stress EF 67%, EKG show NSR at 65, No Lexiscan EKG changes, non-diagnostic for ischemia; STRESS TEST, 01/25/2010 - normal study, post-stress EF 66%, no significant ischemia  . Cancer ([place])   . Cerebral atherosclerosis 09/29/2012   CAROTID DUPLEX - RIGHT  BULB/PROXIMAL ICA-moderate amount of fibrous plaque, 50-69% diameter reduction; LEFT CEA-normal, no significant diameter reduction  . Colitis   . GERD (gastroesophageal reflux disease)   . Hyperlipidemia   . Hypertension   . Osteopenia   . Restless leg syndrome   . Shortness of breath 03/30/2005   2D ECHO - EF >55%, normal  . TIA (transient ischemic attack) 01/27/2010   2D ECHO - EF 65%, normal    ALLERGIES: Allergies  Allergen Reactions  . Penicillins Shortness Of Breath and Other (See Comments)    Has patient had a PCN reaction causing immediate rash, facial/tongue/throat swelling, SOB or lightheadedness with hypotension: Yes Has patient had a PCN reaction causing severe rash involving mucus membranes or skin necrosis: Unk Has patient had a PCN reaction that required hospitalization: Unk Has patient had a PCN reaction occurring within the last 10 years: Yes "States that she was in ICU after being administered"   . Codeine Nausea And Vomiting  . Lipitor [Atorvastatin] Other (See Comments)    Myalgias  . Lisinopril Cough  . Pravastatin Other (See Comments)    Myalgias      MEDICATIONS: I have reviewed the patient's current medications.  PAST SURGICAL HISTORY Past Surgical History:  Procedure Laterality Date  . ABDOMINAL EXPLORATION SURGERY    . CAROTID ENDARTERECTOMY    . ESOPHAGOGASTRODUODENOSCOPY (EGD) WITH PROPOFOL N/A 03/04/2020   Procedure: ESOPHAGOGASTRODUODENOSCOPY (EGD) WITH PROPOFOL;  Surgeon: Rogene Houston, MD;  Location: [facility];  Service: Endoscopy;  Laterality: N/A;  210  . LYMPH NODE BIOPSY N/A 05/20/2019   Procedure: LYMPH NODE BIOPSY CERVICAL;  Surgeon: Aviva Signs, MD;  Location: [facility] ORS;  Service: General;  Laterality: N/A;  . Peripheral Vascular Catheterization  07/16/2005   Right verterbral-50% smooth segmental badsilar artery stenosis on right side, Right carotid-50% ulcerated proxmial stenosis, Left carotid-60 to 70% left externam carotid artery stenosis, 90% proximal left ICA stenosis  . PORTACATH PLACEMENT Left 06/12/2019   Procedure: INSERTION PORT-A-CATH (catheter attached left subclavian);  Surgeon: Aviva Signs, MD;  Location: [facility] ORS;  Service: General;  Laterality: Left;    FAMILY HISTORY: Family History  Problem Relation Age of Onset  . Heart disease Mother   . Hypertension Mother   . Hypertension Father   . Heart disease Father     SOCIAL HISTORY:  reports that she quit smoking about 36 years ago. Her smoking use included cigarettes. She has a 2.50 pack-year smoking history. She has never used smokeless tobacco. She reports that she does not drink alcohol or use drugs.  PERFORMANCE STATUS: The patient's performance status is 3 - Symptomatic, >50% confined to bed  PHYSICAL EXAM: Most Recent Vital Signs: Blood pressure 133/84, pulse 82, temperature (!) 97.4 F (36.3 C), temperature source Oral, resp. rate 16, height 5\' 6"  (1.676 m), weight 163 lb 12.8 oz (74.3 kg), SpO2 95 %. BP 133/84 (BP Location: Right Arm)   Pulse 82   Temp (!) 97.4 F (36.3 C) (Oral)   Resp 16   Ht 5\' 6"  (1.676 m)   Wt 163 lb 12.8 oz (74.3 kg)   SpO2 95%   BMI 26.44 kg/m  General appearance: alert, cooperative and appears stated age Lungs: Bilateral air entry. Heart: regular rate and rhythm Abdomen: Soft, nontender with no palpable masses. Extremities: No edema or cyanosis. Skin: Skin color, texture, turgor normal. No rashes or lesions Neurologic: Grossly normal  LABORATORY DATA:  Results for orders placed or performed during the hospital encounter of 03/08/20 (from the past 48 hour(s))  Comprehensive metabolic panel     Status: Abnormal    Collection Time: 03/08/20  1:56 PM  Result Value Ref Range   Sodium 131 (L) 135 - 145 mmol/L   Potassium 4.5 3.5 - 5.1 mmol/L   Chloride 91 (L) 98 - 111 mmol/L   CO2 26 22 - 32 mmol/L   Glucose, Bld 88 70 - 99 mg/dL    Comment: Glucose reference range applies only to samples taken after fasting for at least 8 hours.   BUN 11 8 - 23 mg/dL   Creatinine, Ser 0.67 0.44 - 1.00 mg/dL   Calcium 9.0 8.9 - 10.3 mg/dL   Total Protein 7.5 6.5 - 8.1 g/dL   Albumin 3.4 (L) 3.5 - 5.0 g/dL   AST 51 (H) 15 - 41 U/L   ALT 15 0 - 44 U/L   Alkaline Phosphatase 136 (H) 38 - 126 U/L   Total Bilirubin 1.3 (H) 0.3 - 1.2 mg/dL   GFR calc non Af Amer >60 >60 mL/min   GFR calc Af Amer >60 >60 mL/min   Anion gap 14 5 - 15    Comment: Performed at [employer] [facility], [address] [place], [place] [address]  CBC with Differential     Status: Abnormal   Collection Time: 03/08/20  1:56 PM  Result Value Ref Range   WBC 7.1 4.0 - 10.5 K/uL   RBC 3.29 (L) 3.87 - 5.11 MIL/uL   Hemoglobin 10.2 (L) 12.0 - 15.0 g/dL   HCT 31.6 (L) 36.0 - 46.0 %   MCV 96.0 80.0 - 100.0 fL   MCH 31.0 26.0 - 34.0 pg   MCHC 32.3 30.0 - 36.0 g/dL   RDW 16.8 (H) 11.5 - 15.5 %   Platelets 345 150 - 400 K/uL   nRBC 0.0 0.0 - 0.2 %   Neutrophils Relative % 74 %   Neutro Abs 5.3 1.7 - 7.7 K/uL   Lymphocytes Relative 13 %   Lymphs Abs 0.9 0.7 - 4.0 K/uL   Monocytes Relative 11 %   Monocytes Absolute 0.8 0.1 - 1.0 K/uL   Eosinophils Relative 0 %   Eosinophils Absolute 0.0 0.0 - 0.5 K/uL   Basophils Relative 1 %   Basophils Absolute 0.1 0.0 - 0.1 K/uL   Immature Granulocytes 1 %   Abs Immature Granulocytes 0.05 0.00 - 0.07 K/uL    Comment: Performed at [facility], [address], [place], [place] [address]  Magnesium     Status: None   Collection Time: 03/08/20  1:56 PM  Result Value Ref Range   Magnesium 2.1 1.7 - 2.4 mg/dL    Comment: Performed at [facility], [address], [place], [place] [address]  SARS CORONAVIRUS 2 (TAT 6-24  HRS) Nasopharyngeal Urine, Clean Catch     Status: None   Collection Time: 03/08/20  2:14 PM   Specimen: Urine, Clean Catch; Nasopharyngeal  Result Value Ref Range   SARS Coronavirus 2 NEGATIVE NEGATIVE    Comment: (NOTE) SARS-CoV-2 target nucleic acids are NOT DETECTED. The SARS-CoV-2 RNA is generally detectable in upper and lower respiratory specimens during the acute phase of infection. Negative results do not preclude SARS-CoV-2 infection, do not rule out co-infections with other pathogens, and should not be used as the sole basis for treatment or other patient management decisions. Negative results must be combined with clinical observations, patient history, and epidemiological information. The expected result is Negative. Fact Sheet for Patients: SugarRoll.be Fact Sheet for Healthcare Providers: https://www.woods-mathews.com/ This test is not yet approved or cleared by the [place] FDA and  has been authorized for detection and/or diagnosis of SARS-CoV-2 by FDA under an Emergency Use Authorization (EUA). This EUA will remain  in effect (meaning this test can be used) for the duration of the COVID-19 declaration under Section 56 4(b)(1) of the Act, 21 U.S.C. section 360bbb-3(b)(1), unless the authorization is terminated or revoked sooner. Performed at [facility], [place] [address] [place], [place] [address]   Urinalysis, Routine w reflex microscopic     Status: Abnormal   Collection Time: 03/08/20  2:15 PM  Result Value Ref Range   Color, Urine YELLOW YELLOW   APPearance CLEAR CLEAR   Specific Gravity, Urine 1.028 1.005 - 1.030   pH 5.0 5.0 - 8.0   Glucose, UA NEGATIVE NEGATIVE mg/dL   Hgb urine dipstick NEGATIVE NEGATIVE   Bilirubin Urine NEGATIVE NEGATIVE   Ketones, ur 20 (A) NEGATIVE mg/dL   Protein, ur 30 (A) NEGATIVE mg/dL   Nitrite NEGATIVE NEGATIVE   Leukocytes,Ua NEGATIVE NEGATIVE   RBC / HPF 0-5 0 - 5 RBC/hpf    WBC, UA 6-10  0 - 5 WBC/hpf   Bacteria, UA RARE (A) NONE SEEN   Squamous Epithelial / LPF 0-5 0 - 5   Mucus PRESENT     Comment: Performed at [facility], [address], [place], [place] [address]      RADIOGRAPHY: I have independently reviewed her scans.     ASSESSMENT and PLAN:  1.  Metastatic lung cancer: -Recent CT of the abdomen on 03/06/2020 showed omental caking left upper quadrant and anterior abdomen.  Also increased size of the hepatic metastatic disease.  Increased size of the hypodense splenic lesion.  Multiple bone meta stasis.  Moderate amount of stool in the ascending and proximal transverse colon. -CT of the chest showed adenopathy in the chest with pressing of the esophageal wall by the lymphadenopathy. -I discussed the findings on the CT scan with the patient and her daughter. -Patient wants to continue to fight and does not want to give up.  I have made it clear to her that she is not in any shape to receive chemotherapy at this time.  Patient's daughter Velva Harman would support what ever her mother wants. -Patient's other daughter [name] is supposed to come Saturday.  Then family will get together and make a decision. -Patient would like to be discharged home once her constipation subsides.  2.  Back pain: -She could not tolerate IV Dilaudid as she is very sensitive to medications. -She was taking one fourth of a pill of 2 mg Dilaudid tablet at home as needed. -I will switch her to Dilaudid liquid 0.5 mg every 4 hours as needed.  3.  Dysphagia: -This is from extrinsic compression on the esophagus at the level of carina. -I will reach out to Dr. Laural Golden and see if palliative stent placement is an option.  4.  Constipation: -Continue Movantik daily.  All questions were answered. The patient knows to call the clinic with any problems, questions or concerns. We can certainly see the patient much sooner if necessary.    Derek Jack

## 2020-03-09 NOTE — Plan of Care (Signed)
  Problem: Acute Rehab PT Goals(only PT should resolve) Goal: Pt Will Go Supine/Side To Sit Outcome: Progressing Flowsheets (Taken 03/09/2020 1112) Pt will go Supine/Side to Sit:  with supervision  with min guard assist Goal: Patient Will Transfer Sit To/From Stand Outcome: Progressing Flowsheets (Taken 03/09/2020 1112) Patient will transfer sit to/from stand:  with min guard assist  with minimal assist Goal: Pt Will Transfer Bed To Chair/Chair To Bed Outcome: Progressing Flowsheets (Taken 03/09/2020 1112) Pt will Transfer Bed to Chair/Chair to Bed: with min assist Goal: Pt Will Ambulate Outcome: Progressing Flowsheets (Taken 03/09/2020 1112) Pt will Ambulate:  25 feet  with minimal assist  with rolling walker   11:13 AM, 03/09/20 Lonell Grandchild, MPT Physical Therapist with [facility] 336 [phone] office [phone] mobile phone

## 2020-03-09 NOTE — Consult Note (Signed)
Consultation Note Date: 03/09/2020   Patient Name: Lindsey Mullins  DOB: Sep 22, 1946  MRN: [id number]  Age / Sex: 74 y.o., female  PCP: [name], MD Referring Physician: Rodena Goldmann, DO  Reason for Consultation: Establishing goals of care and Psychosocial/spiritual support  HPI/Patient Profile: 74 y.o. female  with past medical history of metastatic lung cancer with burden to liver, spleen, and spine that is progressing despite chemotherapy, increase in size of hepatic metastatic disease, currently on immunotherapy, dysphagia from extrinsic esophageal stenosis, colitis, GERD, HTN/HLD, osteopenia, restless leg syndrome, TIA former smoker quit 36 years ago with a 2.5-pack-year history admitted on 03/08/2020 with failure to thrive due to metastatic lung cancer.   Clinical Assessment and Goals of Care: I have reviewed medical records including EPIC notes, labs and imaging, received report from attending, examined the patient and met at bedside with daughter [name] to discuss diagnosis prognosis, [place], EOL wishes, disposition and options.  I introduced Palliative Medicine as specialized medical care for people living with serious illness. It focuses on providing relief from the symptoms and stress of a serious illness.   Mrs. Gasser is resting quietly in bed.  She will respond verbally, but does not open her eyes.  She appears acutely/chronically ill and frail.  She is able to make her basic needs known.  I share that her daughter and I will have a discussion and encouraged her to speak up when she so chooses.  She nods affirmatively.  Daughter [name] seems knowledgeable about Mrs. Bouvier acute and chronic health concerns.  She is able to accurately name what is going on with her cancer treatment and other health problems.  We talk about bowel regimen.  [name] is able to accurately name Mrs. Buzzelli'  health concerns.  She tells me that the medical team is reviewing the latest scans to determine if Mrs. Calip could tolerated PEG placement.    As far as functional and nutritional status, daughter [name] shares that Mrs. Ketner has had approximately 10 pound weight loss over the last month, and has become weaker.  We talked about extrinsic dysphagia due to lymphedema.  We talked about possibilities for treatment.  [name] states that the family is considering PEG tube placement, but realizes that the medical team is reviewing latest scans to determine if this is an option.  She shares that Mrs. Lassalle is open to PEG tube, but also voices that she is tired of having things done to her when they do not seem to be changing anything.  We discussed her current illness and what it means in the larger context of her on-going co-morbidities.  Natural disease trajectory and expectations at EOL were discussed.    Questions and concerns were addressed.  The family was encouraged to call with questions or concerns.   Conference with attending and oncology related to patient's condition, needs, and [place] discussions.   PMT to continue to follow.   HCPOA    NEXT OF KIN -  Daughters [place] and [place].  SUMMARY OF RECOMMENDATIONS   Considering/ Would be agreeable to PEG tube placement if offered.  Agreeable to further chemo/radiation if offered  Code Status/Advance Care Planning:  Full code  Symptom Management:   Per hospitalist, no additional needs at this time.  Pain and anxiety are adequately controlled  Palliative Prophylaxis:   Bowel Regimen and Frequent Pain Assessment  Additional Recommendations (Limitations, Scope, Preferences):  Full Scope Treatment  Psycho-social/Spiritual:   Desire for further Chaplaincy support:no  Additional Recommendations: Caregiving  Support/Resources and Education on Hospice  Prognosis:   Unable to determine, based on outcomes.  Quite guarded  at this time.  3 to 6 months or less would not be surprising based on advancing illness, decreasing functional status, limited by mouth intake due to cancer  Discharge Planning: To be determined, qualified for SNF rehab      Primary Diagnoses: Present on Admission: . Failure to thrive (0-17) . Malignant neoplasm of right lung ([place]) . Squamous cell carcinoma of lung, stage IV, right ([place]) . Failure to thrive in adult   I have reviewed the medical record, interviewed the patient and family, and examined the patient. The following aspects are pertinent.  Past Medical History:  Diagnosis Date  . Atypical chest pain 05/23/2012   STRESS TEST - small to moderate sized area of partial reversibility of the anteroapical wall, most likely breast artifact, post-stress EF 67%, EKG show NSR at 65, No Lexiscan EKG changes, non-diagnostic for ischemia; STRESS TEST, 01/25/2010 - normal study, post-stress EF 66%, no significant ischemia  . Cancer ([place])   . Cerebral atherosclerosis 09/29/2012   CAROTID DUPLEX - RIGHT  BULB/PROXIMAL ICA-moderate amount of fibrous plaque, 50-69% diameter reduction; LEFT CEA-normal, no significant diameter reduction  . Colitis   . GERD (gastroesophageal reflux disease)   . Hyperlipidemia   . Hypertension   . Osteopenia   . Restless leg syndrome   . Shortness of breath 03/30/2005   2D ECHO - EF >55%, normal  . TIA (transient ischemic attack) 01/27/2010   2D ECHO - EF 65%, normal   Social History   Socioeconomic History  . Marital status: Divorced    Spouse name: Not on file  . Number of children: 2  . Years of education: Not on file  . Highest education level: Not on file  Occupational History  . Not on file  Tobacco Use  . Smoking status: Former Smoker    Packs/day: 0.25    Years: 10.00    Pack years: 2.50    Types: Cigarettes    Quit date: 04/21/1983    Years since quitting: 36.9  . Smokeless tobacco: Never Used  Substance and Sexual Activity  . Alcohol use:  No  . Drug use: No  . Sexual activity: Not Currently  Other Topics Concern  . Not on file  Social History Narrative  . Not on file   Social Determinants of Health   Financial Resource Strain: Low Risk   . Difficulty of Paying Living Expenses: Not hard at all  Food Insecurity: No Food Insecurity  . Worried About Charity fundraiser in the Last Year: Never true  . Ran Out of Food in the Last Year: Never true  Transportation Needs: No Transportation Needs  . Lack of Transportation (Medical): No  . Lack of Transportation (Non-Medical): No  Physical Activity: Inactive  . Days of Exercise per Week: 0 days  . Minutes of Exercise per Session: 0 min  Stress: No Stress Concern Present  .  Feeling of Stress : Not at all  Social Connections: Somewhat Isolated  . Frequency of Communication with Friends and Family: Twice a week  . Frequency of Social Gatherings with Friends and Family: Twice a week  . Attends Religious Services: More than 4 times per year  . Active Member of Clubs or Organizations: No  . Attends Archivist Meetings: Never  . Marital Status: Divorced   Family History  Problem Relation Age of Onset  . Heart disease Mother   . Hypertension Mother   . Hypertension Father   . Heart disease Father    Scheduled Meds: . enoxaparin (LOVENOX) injection  40 mg Subcutaneous Q24H  . feeding supplement  1 Container Oral TID BM  . feeding supplement (KATE FARMS STANDARD 1.4)  325 mL Oral Q24H  . levothyroxine  50 mcg Oral QAC breakfast  . magnesium citrate  1 Bottle Oral Once  . naloxegol oxalate  25 mg Oral Daily  . pantoprazole (PROTONIX) IV  40 mg Intravenous Q24H  . polyethylene glycol  17 g Oral BID  . senna-docusate  1 tablet Oral BID   Continuous Infusions: . dextrose 5 % and 0.9% NaCl     PRN Meds:.acetaminophen **OR** acetaminophen, HYDROmorphone HCl, labetalol, polyethylene glycol Medications Prior to Admission:  Prior to Admission medications     Medication Sig Start Date End Date Taking? Authorizing Provider  ciprofloxacin (CIPRO) 250 MG tablet Take 1 tablet (250 mg total) by mouth 2 (two) times daily. 03/01/20  Yes [place], Modena Nunnery, MD  HYDROmorphone (DILAUDID) 2 MG tablet Take 0.5 tablets (1 mg total) by mouth every 4 (four) hours as needed for severe pain. 03/03/20  Yes Derek Jack, MD  levothyroxine (SYNTHROID) 50 MCG tablet Take 50 mcg by mouth daily before breakfast.   Yes [provider]  naloxegol oxalate (MOVANTIK) 25 MG TABS tablet Take 1 tablet (25 mg total) by mouth daily. 03/07/20  Yes Derek Jack, MD  nitroGLYCERIN (NITROSTAT) 0.4 MG SL tablet DISSOLVE ONE TABLET UNDER THE TONGUE AS NEEDED FOR CHEST PAIN**MAX 3 TABLETS EACH 5 MINUTES APART** 09/14/16  Yes [place], Modena Nunnery, MD  omeprazole (PRILOSEC) 20 MG capsule Take 1 po daily Patient taking differently: Take 20 mg by mouth daily.  12/31/18  Yes [place], Modena Nunnery, MD  losartan (COZAAR) 50 MG tablet Take 25 mg by mouth daily.    [provider]  rosuvastatin (CRESTOR) 20 MG tablet Take 20 mg by mouth daily.    [provider]   Allergies  Allergen Reactions  . Penicillins Shortness Of Breath and Other (See Comments)    Has patient had a PCN reaction causing immediate rash, facial/tongue/throat swelling, SOB or lightheadedness with hypotension: Yes Has patient had a PCN reaction causing severe rash involving mucus membranes or skin necrosis: Unk Has patient had a PCN reaction that required hospitalization: Unk Has patient had a PCN reaction occurring within the last 10 years: Yes "States that she was in ICU after being administered"   . Codeine Nausea And Vomiting  . Lipitor [Atorvastatin] Other (See Comments)    Myalgias  . Lisinopril Cough  . Pravastatin Other (See Comments)    Myalgias   Review of Systems  Unable to perform ROS: Acuity of condition    Physical Exam Vitals and nursing note reviewed.  Constitutional:       General: She is not in acute distress.    Appearance: She is ill-appearing.  Cardiovascular:     Rate and Rhythm: Normal  rate.  Pulmonary:     Effort: Pulmonary effort is normal. No respiratory distress.  Abdominal:     General: There is distension.     Tenderness: There is no guarding.  Skin:    General: Skin is warm and dry.  Psychiatric:     Comments: Calm and subdued     Vital Signs: BP 133/84 (BP Location: Right Arm)   Pulse 82   Temp (!) 97.4 F (36.3 C) (Oral)   Resp 16   Ht _0  (1.676 m)   Wt 74.3 kg   SpO2 95%   BMI 26.44 kg/m  Pain Scale: 0-10 POSS *See Group Information*: 1-Acceptable,Awake and alert Pain Score: Asleep   SpO2: SpO2: 95 % O2 Device:SpO2: 95 % O2 Flow Rate: .   IO: Intake/output summary: No intake or output data in the 24 hours ending 03/09/20 1503  LBM: Last BM Date: 03/08/20 Baseline Weight: Weight: 73.5 kg Most recent weight: Weight: 74.3 kg     Palliative Assessment/Data:   Flowsheet Rows     Most Recent Value  Intake Tab  Referral Department  Hospitalist  Unit at Time of Referral  Med/Surg Unit  Palliative Care Primary Diagnosis  Cancer  Date Notified  03/08/20  Palliative Care Type  New Palliative care  Reason for referral  Clarify Goals of Care, Advance Care Planning  Date of Admission  03/08/20  Date first seen by Palliative Care  03/09/20  # of days Palliative referral response time  1 Day(s)  # of days IP prior to Palliative referral  0  Clinical Assessment  Palliative Performance Scale Score  30%  Pain Max last 24 hours  Not able to report  Pain Min Last 24 hours  Not able to report  Dyspnea Max Last 24 Hours  Not able to report  Dyspnea Min Last 24 hours  Not able to report  Psychosocial & Spiritual Assessment  Palliative Care Outcomes      Time In: 1100 Time Out: 1150 Time Total: 50 minutes Greater than 50%  of this time was spent counseling and coordinating care related to the above assessment and  plan.  Signed by: Drue Novel, NP   Please contact Palliative Medicine Team phone at [phone] for questions and concerns.  For individual provider: See Shea Evans

## 2020-03-09 NOTE — Progress Notes (Addendum)
PROGRESS NOTE    Lindsey Mullins  PXT:[id number] DOB: 01-Dec-1945 DOA: 03/08/2020 PCP: [name], MD   Brief Narrative:  Per HPI: Lindsey Mullins is a 74 y.o. female with medical history significant for metastatic lung cancer, hypertension hypothyroidism.  Patient was sent to the ED from the cancer center with reports of generalized weakness, abdominal and back pain and constipation.  Patient was in the ED 2 days ago 4/18 for similar problems, abdominal CT was without evidence of perforation or bowel obstruction, but showed increase in size of hepatic metastatic disease, showed moderate stool burden in ascending and proximal transverse colon.  She was discharged home with a bowel regimen to include magnesium citrate.   Patient also called her oncologist, and was prescribed Movantik.  Patient last bowel movement was this morning, which was a small amount. Daughter at bedside who helps with the history and who takes care of patient sudden decline, patient only able to take very thin liquids, plain Ensure.  Unable to walk without assistance.  Generally weak.  Minimal exertion results in difficulty breathing and wheezing.  Any minimal movement aggravates the pain in her back. Daughter, [name] at bedside is the only caregiver for now.  4/21: Patient has been admitted with failure to thrive in the setting of metastatic lung cancer and is also noted to have some hyponatremia.  She has had EGD on 4/16 which has demonstrated severe extrinsic stenosis of the esophagus.  Palliative consult appreciated to determine goals of care.  PT recommending SNF.  Questionable whether family and patient would desire feeding tube at this point.  Assessment & Plan:   Principal Problem:   Failure to thrive (0-17) Active Problems:   Squamous cell carcinoma of lung, stage IV, right ([facility])   Malignant neoplasm of right lung ([facility])   Failure to thrive in the setting of metastatic lung cancer -Appreciate  palliative care consultation -Continue nutritional supplementation per dietitian with boost breeze 3 times daily -PT recommending SNF -Likely need for feeding tube if ongoing treatment and rehab desired  Dysphagia -SLP evaluation requested -Recent EGD 4/16 with extrinsic compression of the esophagus noted  Hyponatremia -Likely secondary to poor intake as well as lung cancer with SIADH -Appears to be improving right now with normal saline, continue to follow -Order further studies  Constipation -Continue Movantik -Enemas as needed -Senokot twice daily  Hypothyroidism -Check TSH and continue levothyroxine  Hypertension -IV labetalol as needed, but currently controlled  DVT prophylaxis: Lovenox Code Status: Full code Family Communication: We will plan to discuss with family Disposition Plan: Elaboration on goals of care per palliative discussion pending.  Potential need for SNF/rehab as well as feeding tube if full management desired.   Consultants:   Palliative care  Procedures:   None  Antimicrobials:   None   Subjective: Patient seen and evaluated today and appears quite somnolent.  No other acute overnight events noted.  She is having difficulty taking her pills.  Objective: Vitals:   03/08/20 1815 03/08/20 2154 03/09/20 0606 03/09/20 0856  BP: 131/72 134/83 (!) 133/101   Pulse: 86 91 91   Resp: 18 17 16    Temp: 98.6 F (37 C) 98.6 F (37 C) 97.8 F (36.6 C)   TempSrc: Oral Oral Oral   SpO2: 97% 97% 100% 98%  Weight: 74.3 kg     Height: 5\' 6"  (1.676 m)      No intake or output data in the 24 hours ending 03/09/20 [place]  03/08/20 1139 03/08/20 1815  Weight: 73.5 kg 74.3 kg    Examination:  General exam: Appears calm and comfortable, somnolent Respiratory system: Clear to auscultation. Respiratory effort normal. Cardiovascular system: S1 & S2 heard, RRR. No JVD, murmurs, rubs, gallops or clicks. No pedal edema. Gastrointestinal  system: Abdomen is nondistended, soft and nontender. No organomegaly or masses felt. Normal bowel sounds heard. Central nervous system: Somnolent but arousable Extremities: No edema Skin: No rashes, lesions or ulcers Psychiatry: Flat affect    Data Reviewed: I have personally reviewed following labs and imaging studies  CBC: Recent Labs  Lab 03/06/20 2137 03/08/20 1356  WBC 6.6 7.1  NEUTROABS  --  5.3  HGB 11.0* 10.2*  HCT 34.6* 31.6*  MCV 96.6 96.0  PLT 304 606   Basic Metabolic Panel: Recent Labs  Lab 03/06/20 2137 03/08/20 1356  NA 127* 131*  K 4.6 4.5  CL 91* 91*  CO2 22 26  GLUCOSE 92 88  BUN 11 11  CREATININE 0.65 0.67  CALCIUM 8.6* 9.0  MG  --  2.1   GFR: Estimated Creatinine Clearance: 65.5 mL/min (by C-G formula based on SCr of 0.67 mg/dL). Liver Function Tests: Recent Labs  Lab 03/06/20 2137 03/08/20 1356  AST 55* 51*  ALT 17 15  ALKPHOS 126 136*  BILITOT 1.8* 1.3*  PROT 7.5 7.5  ALBUMIN 3.4* 3.4*   Recent Labs  Lab 03/06/20 2137  LIPASE 36   No results for input(s): AMMONIA in the last 168 hours. Coagulation Profile: No results for input(s): INR, PROTIME in the last 168 hours. Cardiac Enzymes: No results for input(s): CKTOTAL, CKMB, CKMBINDEX, TROPONINI in the last 168 hours. BNP (last 3 results) No results for input(s): PROBNP in the last 8760 hours. HbA1C: No results for input(s): HGBA1C in the last 72 hours. CBG: No results for input(s): GLUCAP in the last 168 hours. Lipid Profile: No results for input(s): CHOL, HDL, LDLCALC, TRIG, CHOLHDL, LDLDIRECT in the last 72 hours. Thyroid Function Tests: No results for input(s): TSH, T4TOTAL, FREET4, T3FREE, THYROIDAB in the last 72 hours. Anemia Panel: No results for input(s): VITAMINB12, FOLATE, FERRITIN, TIBC, IRON, RETICCTPCT in the last 72 hours. Sepsis Labs: No results for input(s): PROCALCITON, LATICACIDVEN in the last 168 hours.  Recent Results (from the past 240 hour(s))  SARS  CORONAVIRUS 2 (TAT 6-24 HRS) Nasopharyngeal Nasopharyngeal Swab     Status: None   Collection Time: 03/03/20  7:12 AM   Specimen: Nasopharyngeal Swab  Result Value Ref Range Status   SARS Coronavirus 2 NEGATIVE NEGATIVE Final    Comment: (NOTE) SARS-CoV-2 target nucleic acids are NOT DETECTED. The SARS-CoV-2 RNA is generally detectable in upper and lower respiratory specimens during the acute phase of infection. Negative results do not preclude SARS-CoV-2 infection, do not rule out co-infections with other pathogens, and should not be used as the sole basis for treatment or other patient management decisions. Negative results must be combined with clinical observations, patient history, and epidemiological information. The expected result is Negative. Fact Sheet for Patients: SugarRoll.be Fact Sheet for Healthcare Providers: https://www.woods-mathews.com/ This test is not yet approved or cleared by the [place] FDA and  has been authorized for detection and/or diagnosis of SARS-CoV-2 by FDA under an Emergency Use Authorization (EUA). This EUA will remain  in effect (meaning this test can be used) for the duration of the COVID-19 declaration under Section 56 4(b)(1) of the Act, 21 U.S.C. section 360bbb-3(b)(1), unless the authorization is terminated or revoked sooner.  Performed at [facility], [place] [address] [place], [place] [address]   SARS CORONAVIRUS 2 (TAT 6-24 HRS) Nasopharyngeal Urine, Clean Catch     Status: None   Collection Time: 03/08/20  2:14 PM   Specimen: Urine, Clean Catch; Nasopharyngeal  Result Value Ref Range Status   SARS Coronavirus 2 NEGATIVE NEGATIVE Final    Comment: (NOTE) SARS-CoV-2 target nucleic acids are NOT DETECTED. The SARS-CoV-2 RNA is generally detectable in upper and lower respiratory specimens during the acute phase of infection. Negative results do not preclude SARS-CoV-2 infection, do not  rule out co-infections with other pathogens, and should not be used as the sole basis for treatment or other patient management decisions. Negative results must be combined with clinical observations, patient history, and epidemiological information. The expected result is Negative. Fact Sheet for Patients: SugarRoll.be Fact Sheet for Healthcare Providers: https://www.woods-mathews.com/ This test is not yet approved or cleared by the [place] FDA and  has been authorized for detection and/or diagnosis of SARS-CoV-2 by FDA under an Emergency Use Authorization (EUA). This EUA will remain  in effect (meaning this test can be used) for the duration of the COVID-19 declaration under Section 56 4(b)(1) of the Act, 21 U.S.C. section 360bbb-3(b)(1), unless the authorization is terminated or revoked sooner. Performed at [facility], [place] [address], [place], [place] [address]          Radiology Studies: No results found.      Scheduled Meds: . enoxaparin (LOVENOX) injection  40 mg Subcutaneous Q24H  . feeding supplement  1 Container Oral TID BM  . feeding supplement (KATE FARMS STANDARD 1.4)  325 mL Oral Q24H  . levothyroxine  50 mcg Oral QAC breakfast  . magnesium citrate  1 Bottle Oral Once  . naloxegol oxalate  25 mg Oral Daily  . pantoprazole (PROTONIX) IV  40 mg Intravenous Q24H  . polyethylene glycol  17 g Oral BID  . senna-docusate  1 tablet Oral BID   Continuous Infusions: . dextrose 5 % and 0.9% NaCl       LOS: 0 days    Time spent: 30 minutes    Lida Berkery Darleen Crocker, DO [facility]  If 7PM-7AM, please contact night-coverage [email or web address] 03/09/2020, 1:25 PM

## 2020-03-09 NOTE — Plan of Care (Signed)
  Problem: SLP Dysphagia Goals Goal: Patient will utilize recommended strategies Description: Patient will utilize recommended strategies during swallow to increase swallowing safety with Flowsheets (Taken 03/09/2020 1549) Patient will utilize recommended strategies during swallow to increase swallowing safety with: min assist

## 2020-03-09 NOTE — Evaluation (Signed)
Clinical/Bedside Swallow Evaluation Patient Details  Name: Lindsey Mullins MRN: [id number] Date of Birth: [birth year]  Today's Date: 03/09/2020 Time: SLP Start Time (ACUTE ONLY): 1528 SLP Stop Time (ACUTE ONLY): 1541 SLP Time Calculation (min) (ACUTE ONLY): 13 min  Past Medical History:  Past Medical History:  Diagnosis Date  . Atypical chest pain 05/23/2012   STRESS TEST - small to moderate sized area of partial reversibility of the anteroapical wall, most likely breast artifact, post-stress EF 67%, EKG show NSR at 65, No Lexiscan EKG changes, non-diagnostic for ischemia; STRESS TEST, 01/25/2010 - normal study, post-stress EF 66%, no significant ischemia  . Cancer ([place])   . Cerebral atherosclerosis 09/29/2012   CAROTID DUPLEX - RIGHT  BULB/PROXIMAL ICA-moderate amount of fibrous plaque, 50-69% diameter reduction; LEFT CEA-normal, no significant diameter reduction  . Colitis   . GERD (gastroesophageal reflux disease)   . Hyperlipidemia   . Hypertension   . Osteopenia   . Restless leg syndrome   . Shortness of breath 03/30/2005   2D ECHO - EF >55%, normal  . TIA (transient ischemic attack) 01/27/2010   2D ECHO - EF 65%, normal   Past Surgical History:  Past Surgical History:  Procedure Laterality Date  . ABDOMINAL EXPLORATION SURGERY    . CAROTID ENDARTERECTOMY    . ESOPHAGOGASTRODUODENOSCOPY (EGD) WITH PROPOFOL N/A 03/04/2020   Procedure: ESOPHAGOGASTRODUODENOSCOPY (EGD) WITH PROPOFOL;  Surgeon: Rogene Houston, MD;  Location: [facility];  Service: Endoscopy;  Laterality: N/A;  210  . LYMPH NODE BIOPSY N/A 05/20/2019   Procedure: LYMPH NODE BIOPSY CERVICAL;  Surgeon: Aviva Signs, MD;  Location: [facility];  Service: General;  Laterality: N/A;  . Peripheral Vascular Catheterization  07/16/2005   Right verterbral-50% smooth segmental badsilar artery stenosis on right side, Right carotid-50% ulcerated proxmial stenosis, Left carotid-60 to 70% left externam carotid artery stenosis, 90%  proximal left ICA stenosis  . PORTACATH PLACEMENT Left 06/12/2019   Procedure: INSERTION PORT-A-CATH (catheter attached left subclavian);  Surgeon: Aviva Signs, MD;  Location: [facility];  Service: General;  Laterality: Left;   HPI:  Lindsey Lore Perkinsis a 74 y.o.femalewith medical history significant formetastatic lung cancer,hypertension hypothyroidism. Patient was sent to the ED from the cancer center with reports of generalized weakness, abdominal and back pain and constipation. Patient was in the ED 2 days ago 4/18 for similar problems, abdominal CT was without evidence of perforation or bowel obstruction, but showed increase in size of hepatic metastatic disease,showed moderate stool burden in ascending and proximal transverse colon. She was discharged home with a bowel regimen to include magnesium citrate. EGD 4/16 with extrinsic compression of the esophagus noted, BSE ordered.   Assessment / Plan / Recommendation Clinical Impression  Clinical swallowing evaluation completed while Pt was seated upright in bed, Pt's daughter who is also caregiver was present for evaluation. Daughter reports Pt has difficulty with all solid textures but no difficulty with liquids. Pt was assessed with clear liquids (no solid textures) as per diet order and demonstrated no overt s/sx of aspiration with liquids assessed. Defer diet upgrade to GI. ST will f/u X1 for diet tolerance after/if Pt is upgraded to solid textures ensure oropharyngeal tolerance. Thank you,  SLP Visit Diagnosis: Dysphagia, unspecified (R13.10)    Aspiration Risk  Mild aspiration risk    Diet Recommendation Thin liquid   Liquid Administration via: Cup;Straw Medication Administration: Crushed with puree Supervision: Intermittent supervision to cue for compensatory strategies Compensations: Minimize environmental distractions;Slow rate;Small sips/bites;Follow solids with liquid Postural Changes:  Seated upright at 90 degrees;Remain  upright for at least 30 minutes after po intake    Other  Recommendations Oral Care Recommendations: Oral care BID   Follow up Recommendations 24 hour supervision/assistance      Frequency and Duration min 1 x/week  1 week       Prognosis Prognosis for Safe Diet Advancement: Fair      Swallow Study   General Date of Onset: 03/08/20 HPI: Lindsey Negrete Perkinsis a 74 y.o.femalewith medical history significant formetastatic lung cancer,hypertension hypothyroidism. Patient was sent to the ED from the cancer center with reports of generalized weakness, abdominal and back pain and constipation. Patient was in the ED 2 days ago 4/18 for similar problems, abdominal CT was without evidence of perforation or bowel obstruction, but showed increase in size of hepatic metastatic disease,showed moderate stool burden in ascending and proximal transverse colon. She was discharged home with a bowel regimen to include magnesium citrate. EGD 4/16 with extrinsic compression of the esophagus noted, BSE ordered. Type of Study: Bedside Swallow Evaluation Previous Swallow Assessment: none in chart Diet Prior to this Study: Thin liquids Temperature Spikes Noted: No Respiratory Status: Room air History of Recent Intubation: No Behavior/Cognition: Alert;Cooperative;Pleasant mood Oral Cavity Assessment: Within Functional Limits Oral Care Completed by SLP: Recent completion by staff Self-Feeding Abilities: Able to feed self Patient Positioning: Upright in bed Baseline Vocal Quality: Normal Volitional Cough: Strong Volitional Swallow: Able to elicit    Oral/Motor/Sensory Function Overall Oral Motor/Sensory Function: Within functional limits   Ice Chips Ice chips: Within functional limits   Thin Liquid Thin Liquid: Within functional limits    Nectar Thick Nectar Thick Liquid: Not tested   Honey Thick Honey Thick Liquid: Not tested   Puree Puree: Not tested   Solid     Solid: Not tested     Lindsey Mullins  H. Roddie Mc, CCC-SLP Speech Language Pathologist  Wende Bushy 03/09/2020,3:49 PM

## 2020-03-09 NOTE — Progress Notes (Signed)
Initial Nutrition Assessment  DOCUMENTATION CODES:   Not applicable  INTERVENTION:  D/c Ensure Enlive due to reported supplement intolerance Continue Boost Breeze TID  [employer] 1.4 po daily, each supplement provides 455 kcal and 20 grams of protein (vanilla)   NUTRITION DIAGNOSIS:   Inadequate oral intake related to cancer and cancer related treatments(metastatic lung cancer currently on chemotherapy) as evidenced by per patient/family report(progessive swallowing difficulties and tolerating only thin liquids over the past 2 weeks).    GOAL:   Patient will meet greater than or equal to 90% of their needs    MONITOR:   PO intake, Supplement acceptance, Weight trends, Labs, I & O's  REASON FOR ASSESSMENT:   Malnutrition Screening Tool    ASSESSMENT:   74 year old female with past medical history of metastatic lung cancer, currently on chemo/RXT therapies and followed by Dr. Delton Coombes, HTN, hypothyroidism, GERD, HLD, RLS, history of colitis, presented from the cancer center with generalized weakness, abdominal pain, back pain, and constipation. Patient recently seen in ED on 4/18 for similar symptoms, abdominal CT without evidence of perforation or bowel obstruction, but showed increase in size of hepatic metastatic disease, moderate stool burden in ascending and proximal transverse colon and discharged home with bowel regimen.  Patient admitted on 4/21 for failure to thrive in adult.  Per chart review, patient was recently referred to Dr. Laural Golden due to difficulty swallowing thick liquids and solids and is s/p EGD on 4/16 that showed severe extrinsic stenosis in the esophagus, likely due to metastatic lymphadenopathy. Follow up CT revealed significant progression of adenopathy in bilateral neck, possible invasion of the esophageal wall by tumor in mid to distal esophagus.   Patient sleeping this afternoon with untouched food tray on bedside table. Patient easily awakened by  calling her name, she reports feeling a little tired today and closed her eyes again. Daughter at bedside reports tray delivered just prior to visit, recalls patient did not have consume much of her breakfast tray this morning. Daughter reports progressive dysphagia and was tolerating soft foods until the past 2 weeks. Daughter reports patient drinking Ensure Clear supplement at home, stated that patient has a milk allergy and unable to tolerate regular Boost/Ensure supplements. Per medications, patient is provided Boost Breeze TID,and Ensure Enlive BID. Will discontinue Ensure supplement due to reported intolerance. RD suggested  D[employer] ONS for increased calories and protein (nutrient dense supplement is dairy free, GF, and soy free), daughter amenable to trying supplement.  Current wt 163.46 lbs UBW 175 lbs per daughter report Per history, on 03/02/20 pt weighed 161.7 lbs, on 02/19/20 pt weighed 168.74 lbs, on 01/26/20 pt weighed 176.66 lbs, on 12/30/19 pt weighed 174.02 lbs, on 12/09/19 pt weighed 175.12 lbs. This indicates ~ 12 lb (6.7%) wt loss in 3 months and ~ 6 lb (3.3%) wt loss in the past month which is insignificant but concerning given her advanced age and history of metastatic lung cancer currently undergoing chemotherapy.  Medications reviewed and include: Movantik, Protonix, Miralax, Senokot Labs: Na 131 (L)  NUTRITION - FOCUSED PHYSICAL EXAM: Deferred due to pt resting during visit.   Diet Order:   Diet Order            Diet clear liquid Room service appropriate? Yes; Fluid consistency: Thin  Diet effective now              EDUCATION NEEDS:   No education needs have been identified at this time  Skin:  Skin Assessment:  Reviewed RN Assessment  Last BM:  4/20 type 6  Height:   Ht Readings from Last 1 Encounters:  03/08/20 5\' 6"  (1.676 m)    Weight:   Wt Readings from Last 1 Encounters:  03/08/20 74.3 kg    Ideal Body Weight:     BMI:  Body mass index is 26.44  kg/m.  Estimated Nutritional Needs:   Kcal:  1900-2100  Protein:  90-105  Fluid:  >/= 1.9 L/day   Lajuan Lines, RD, LDN Clinical Nutrition After Hours/Weekend Pager # in [place]

## 2020-03-09 NOTE — NC FL2 (Signed)
[place] LEVEL OF CARE SCREENING TOOL     IDENTIFICATION  Patient Name: Lindsey Mullins Birthdate: [birth year] Sex: female Admission Date (Current Location): 03/08/2020  [facility] and [place] Number:  [employer] and Address:  [facility] [address], [place]      Provider Number: [id number]  Attending Physician Name and Address:  Rodena Goldmann, DO  Relative Name and Phone Number:       Current Level of Care: Hospital Recommended Level of Care: [place] Prior Approval Number:    Date Approved/Denied:   PASRR Number: [id number]  Discharge Plan: SNF    Current Diagnoses: Patient Active Problem List   Diagnosis Date Noted  . Failure to thrive in adult 03/09/2020  . Failure to thrive (0-17) 03/08/2020  . Hypothyroidism 01/26/2020  . Peripheral edema 09/11/2019  . Malignant neoplasm of right lung ([place])   . Goals of care, counseling/discussion 06/10/2019  . Squamous cell carcinoma of lung, stage IV, right ([place]) 05/28/2019  . Metastatic cancer ([place]) 05/26/2019  . Cervical lymphadenopathy   . Compression fracture of L2 ([place]) 04/29/2019  . Arteriosclerosis, mesenteric artery ([place]) 12/31/2018  . Abdominal pain 12/22/2018  . Colitis 12/22/2018  . Osteopenia   . DDD (degenerative disc disease), lumbar 05/11/2016  . Obesity 05/11/2016  . Glucose intolerance (impaired glucose tolerance) 01/12/2016  . Pain in the chest   . Chest pain 01/10/2016  . GERD (gastroesophageal reflux disease) 01/26/2015  . Stress and adjustment reaction 01/26/2015  . Carotid artery disease ([place]) 04/20/2013  . Bunion, left 09/21/2012  . Overweight(278.02) 06/04/2012  . Essential hypertension, benign 04/02/2012  . Hyperlipidemia 04/02/2012  . Dysphagia 04/02/2012  . RLS (restless legs syndrome) 04/02/2012    Orientation RESPIRATION BLADDER Height & Weight     Self  Normal Continent Weight: 163 lb 12.8 oz (74.3 kg) Height:   5\' 6"  (167.6 cm)  BEHAVIORAL SYMPTOMS/MOOD NEUROLOGICAL BOWEL NUTRITION STATUS      Continent Diet(see dc summary)  AMBULATORY STATUS COMMUNICATION OF NEEDS Skin   Extensive Assist Verbally Normal                       Personal Care Assistance Level of Assistance  Bathing, Feeding, Dressing Bathing Assistance: Limited assistance Feeding assistance: Limited assistance Dressing Assistance: Limited assistance     Functional Limitations Info  Sight, Speech, Hearing Sight Info: Adequate Hearing Info: Adequate Speech Info: Adequate    SPECIAL CARE FACTORS FREQUENCY  PT (By licensed PT), OT (By licensed OT)     PT Frequency: 5x week OT Frequency: 3x week            Contractures Contractures Info: Not present    Additional Factors Info  Code Status, Allergies Code Status Info: Full Allergies Info: Penicillins, Codeine, Lipitor, Lisinopril, Pravastatin           Current Medications (03/09/2020):  This is the current hospital active medication list Current Facility-Administered Medications  Medication Dose Route Frequency Provider Last Rate Last Admin  . acetaminophen (TYLENOL) tablet 650 mg  650 mg Oral Q6H PRN Emokpae, Ejiroghene E, MD       Or  . acetaminophen (TYLENOL) suppository 650 mg  650 mg Rectal Q6H PRN Emokpae, Ejiroghene E, MD      . dextrose 5 %-0.9 % sodium chloride infusion   Intravenous Continuous Emokpae, Ejiroghene E, MD      . enoxaparin (LOVENOX) injection 40 mg  40 mg  Subcutaneous Q24H Emokpae, Ejiroghene E, MD   40 mg at 03/08/20 2030  . feeding supplement (BOOST / RESOURCE BREEZE) liquid 1 Container  1 Container Oral TID BM Emokpae, Ejiroghene E, MD      . feeding supplement (KATE FARMS STANDARD 1.4) liquid 325 mL  325 mL Oral Q24H Shah, Pratik D, DO      . HYDROmorphone HCl (DILAUDID) liquid 0.5 mg  0.5 mg Oral Q6H PRN Manuella Ghazi, Pratik D, DO      . labetalol (NORMODYNE) injection 5 mg  5 mg Intravenous Q2H PRN Emokpae, Ejiroghene E, MD      .  levothyroxine (SYNTHROID) tablet 50 mcg  50 mcg Oral QAC breakfast Emokpae, Ejiroghene E, MD      . magnesium citrate solution 1 Bottle  1 Bottle Oral Once Emokpae, Ejiroghene E, MD      . naloxegol oxalate (MOVANTIK) tablet 25 mg  25 mg Oral Daily Emokpae, Ejiroghene E, MD   25 mg at 03/09/20 0915  . pantoprazole (PROTONIX) injection 40 mg  40 mg Intravenous Q24H Lang Snow, FNP   40 mg at 03/08/20 2155  . polyethylene glycol (MIRALAX / GLYCOLAX) packet 17 g  17 g Oral Daily PRN Emokpae, Ejiroghene E, MD   17 g at 03/08/20 2029  . polyethylene glycol (MIRALAX / GLYCOLAX) packet 17 g  17 g Oral BID Emokpae, Ejiroghene E, MD   17 g at 03/09/20 0915  . senna-docusate (Senokot-S) tablet 1 tablet  1 tablet Oral BID Emokpae, Ejiroghene E, MD       Facility-Administered Medications Ordered in Other Encounters  Medication Dose Route Frequency Provider Last Rate Last Admin  . sodium chloride flush (NS) 0.9 % injection 10 mL  10 mL Intracatheter PRN Derek Jack, MD   10 mL at 10/07/19 1140     Discharge Medications: Please see discharge summary for a list of discharge medications.  Relevant Imaging Results:  Relevant Lab Results:   Additional Information SSN: [id number] [place]

## 2020-03-09 NOTE — Evaluation (Signed)
Physical Therapy Evaluation Patient Details Name: Lindsey Mullins MRN: [id number] DOB: 10/12/1946 Today's Date: 03/09/2020   History of Present Illness  Lindsey Mullins is a 74 y.o. female with medical history significant for metastatic lung cancer, hypertension hypothyroidism.  Patient was sent to the ED from the cancer center with reports of generalized weakness, abdominal and back pain and constipation.  Patient was in the ED 2 days ago 4/18 for similar problems, abdominal CT was without evidence of perforation or bowel obstruction, but showed increase in size of hepatic metastatic disease, showed moderate stool burden in ascending and proximal transverse colon.  She was discharged home with a bowel regimen to include magnesium citrate.  Patient also called her oncologist, and was prescribed Movantik.  Patient last bowel movement was this morning, which was a small amount.Daughter at bedside who helps with the history and who takes care of patient sudden decline, patient only able to take very thin liquids, plain Ensure.  Unable to walk without assistance.  Generally weak.  Minimal exertion results in difficulty breathing and wheezing.  Any minimal movement aggravates the pain in her back.Daughter, [name] at bedside is the only caregiver for now.    Clinical Impression  Patient limited for functional mobility as stated below secondary to BLE weakness, fatigue and poor standing balance.  Patient demonstrates slow labored movement for sitting up at bedside, limited to a few steps and requested to go back to bed due to fatigue.  Patient will benefit from continued physical therapy in hospital and recommended venue below to increase strength, balance, endurance for safe ADLs and gait.     Follow Up Recommendations SNF    Equipment Recommendations  Rolling walker with 5" wheels    Recommendations for Other Services       Precautions / Restrictions Precautions Precautions:  Fall Restrictions Weight Bearing Restrictions: No      Mobility  Bed Mobility Overal bed mobility: Needs Assistance Bed Mobility: Supine to Sit;Sit to Supine     Supine to sit: Min guard Sit to supine: Min assist   General bed mobility comments: requires assistance to move legs when getting back into bed  Transfers Overall transfer level: Needs assistance Equipment used: Rolling walker (2 wheeled);None Transfers: Sit to/from [employer] to Stand: Min assist Stand pivot transfers: Mod assist       General transfer comment: unable to stand without use of AD due to weakness, had to use RW demonstrating slow labored movement  Ambulation/Gait Ambulation/Gait assistance: Mod assist Gait Distance (Feet): 5 Feet Assistive device: Rolling walker (2 wheeled) Gait Pattern/deviations: Decreased step length - right;Decreased step length - left;Decreased stride length Gait velocity: slow   General Gait Details: limited to 5-6 steps at bedside with slow labored unsteady cadence, limited secondary to generalized weakness/fatigue  Stairs            Wheelchair Mobility    Modified Rankin (Stroke Patients Only)       Balance Overall balance assessment: Needs assistance Sitting-balance support: Feet supported;No upper extremity supported Sitting balance-Leahy Scale: Fair Sitting balance - Comments: fair/good seated at bedside   Standing balance support: During functional activity;No upper extremity supported Standing balance-Leahy Scale: Poor Standing balance comment: fair/poor using RW                             Pertinent Vitals/Pain Pain Assessment: No/denies pain    Home Living Family/patient expects to be discharged  to:: Private residence Living Arrangements: Non-relatives/Friends Available Help at Discharge: Friend(s);Available 24 hours/day Type of Home: House Home Access: Level entry     Home Layout: One level Home  Equipment: Shower seat;Bedside commode      Prior Function Level of Independence: Independent         Comments: household ambulator without AD for last 6 months, 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, drove     Journalist, newspaper        Extremity/Trunk Assessment   Upper Extremity Assessment Upper Extremity Assessment: Generalized weakness    Lower Extremity Assessment Lower Extremity Assessment: Generalized weakness    Cervical / Trunk Assessment Cervical / Trunk Assessment: Normal  Communication   Communication: No difficulties  Cognition Arousal/Alertness: Awake/alert Behavior During Therapy: WFL for tasks assessed/performed Overall Cognitive Status: Within Functional Limits for tasks assessed                                        General Comments      Exercises     Assessment/Plan    PT Assessment Patient needs continued PT services  PT Problem List Decreased strength;Decreased activity tolerance;Decreased balance;Decreased mobility       PT Treatment Interventions Gait training;Stair training;Functional mobility training;Therapeutic exercise;Therapeutic activities;Patient/family education;Balance training    PT Goals (Current goals can be found in the Care Plan section)  Acute Rehab PT Goals Patient Stated Goal: return home able to walk after rehab PT Goal Formulation: With patient Time For Goal Achievement: 03/23/20 Potential to Achieve Goals: Good    Frequency Min 3X/week   Barriers to discharge        Co-evaluation               AM-PAC PT "6 Clicks" Mobility  Outcome Measure Help needed turning from your back to your side while in a flat bed without using bedrails?: A Little Help needed moving from lying on your back to sitting on the side of a flat bed without using bedrails?: A Little Help needed moving to and from a bed to a chair (including a wheelchair)?: A Lot Help needed standing up from a chair using your arms  (e.g., wheelchair or bedside chair)?: A Lot Help needed to walk in hospital room?: A Lot Help needed climbing 3-5 steps with a railing? : Total 6 Click Score: 13    End of Session   Activity Tolerance: Patient tolerated treatment well;Patient limited by fatigue Patient left: in bed;with call bell/phone within reach;with bed alarm set Nurse Communication: Mobility status PT Visit Diagnosis: Unsteadiness on feet (R26.81);Other abnormalities of gait and mobility (R26.89);Muscle weakness (generalized) (M62.81)    Time: 6378-5885 PT Time Calculation (min) (ACUTE ONLY): 22 min   Charges:   PT Evaluation $PT Eval Moderate Complexity: 1 Mod PT Treatments $Therapeutic Activity: 8-22 mins        11:12 AM, 03/09/20 Lonell Grandchild, MPT Physical Therapist with [facility] 336 [phone] office [phone] mobile phone

## 2020-03-09 NOTE — TOC Initial Note (Signed)
Transition of Care [facility]) - Initial/Assessment Note    Patient Details  Name: Lindsey Mullins MRN: [id number] Date of Birth: 1946/10/12  [facility]) CM/SW Contact:    Shade Flood, LCSW Phone Number: 03/09/2020, 4:21 PM  Clinical Narrative:                  Pt from home. PT recommending SNF. Palliative consulting. Awaiting outcome of the Palliative consult. If plan is for SNF rehab, will refer out and start insurance auth.  Expected Discharge Plan: Skilled Nursing Facility Barriers to Discharge: Continued Medical Work up   Patient Goals and CMS Choice        Expected Discharge Plan and Services Expected Discharge Plan: [place] In-house Referral: Clinical Social Work     Living arrangements for the past 2 months: Single Family Home                                      Prior Living Arrangements/Services Living arrangements for the past 2 months: Single Family Home Lives with:: Adult Children Patient language and need for interpreter reviewed:: Yes Do you feel safe going back to the place where you live?: Yes      Need for Family Participation in Patient Care: Yes (Comment) Care giver support system in place?: Yes (comment)   Criminal Activity/Legal Involvement Pertinent to Current Situation/Hospitalization: No - Comment as needed  Activities of Daily Living Home Assistive Devices/Equipment: None ADL Screening (condition at time of admission) Patient's cognitive ability adequate to safely complete daily activities?: Yes Is the patient deaf or have difficulty hearing?: No Does the patient have difficulty seeing, even when wearing glasses/contacts?: No Does the patient have difficulty concentrating, remembering, or making decisions?: No Patient able to express need for assistance with ADLs?: Yes Does the patient have difficulty dressing or bathing?: Yes Independently performs ADLs?: No Communication: Independent with device  (comment), Needs assistance Is this a change from baseline?: Pre-admission baseline Dressing (OT): Needs assistance Is this a change from baseline?: Pre-admission baseline Grooming: Needs assistance Is this a change from baseline?: Pre-admission baseline Feeding: Needs assistance Bathing: Needs assistance Is this a change from baseline?: Pre-admission baseline Toileting: Needs assistance Is this a change from baseline?: Pre-admission baseline In/Out Bed: Needs assistance Is this a change from baseline?: Pre-admission baseline Walks in Home: Needs assistance Does the patient have difficulty walking or climbing stairs?: Yes Weakness of Legs: Both Weakness of Arms/Hands: Both  Permission Sought/Granted                  Emotional Assessment       Orientation: : Oriented to Self, Oriented to Place, Oriented to  Time, Oriented to Situation Alcohol / Substance Use: Not Applicable Psych Involvement: No (comment)  Admission diagnosis:  Failure to thrive (0-17) [R62.51] Failure to thrive in adult [R62.7] Patient Active Problem List   Diagnosis Date Noted  . Failure to thrive in adult 03/09/2020  . Failure to thrive (0-17) 03/08/2020  . Hypothyroidism 01/26/2020  . Peripheral edema 09/11/2019  . Malignant neoplasm of right lung ([place])   . Goals of care, counseling/discussion 06/10/2019  . Squamous cell carcinoma of lung, stage IV, right ([place]) 05/28/2019  . Metastatic cancer ([place]) 05/26/2019  . Cervical lymphadenopathy   . Compression fracture of L2 ([place]) 04/29/2019  . Arteriosclerosis, mesenteric artery ([place]) 12/31/2018  . Abdominal pain 12/22/2018  . Colitis 12/22/2018  .  Osteopenia   . DDD (degenerative disc disease), lumbar 05/11/2016  . Obesity 05/11/2016  . Glucose intolerance (impaired glucose tolerance) 01/12/2016  . Pain in the chest   . Chest pain 01/10/2016  . GERD (gastroesophageal reflux disease) 01/26/2015  . Stress and adjustment reaction 01/26/2015  .  Carotid artery disease ([place]) 04/20/2013  . Bunion, left 09/21/2012  . Overweight(278.02) 06/04/2012  . Essential hypertension, benign 04/02/2012  . Hyperlipidemia 04/02/2012  . Dysphagia 04/02/2012  . RLS (restless legs syndrome) 04/02/2012   PCP:  [name], MD Pharmacy:   [facility] [address], [place] [address] [id number] [place] [address] [place] [address] Phone: [phone] Fax: [phone]     Social Determinants of Health (SDOH) Interventions    Readmission Risk Interventions No flowsheet data found.

## 2020-03-10 LAB — T4, FREE: Free T4: 0.46 ng/dL — ABNORMAL LOW (ref 0.61–1.12)

## 2020-03-10 LAB — TSH: TSH: 41.784 u[IU]/mL — ABNORMAL HIGH (ref 0.350–4.500)

## 2020-03-10 MED ORDER — HYDROMORPHONE HCL 1 MG/ML IJ SOLN: 0.2500 mg | Freq: Four times a day (QID) | INTRAMUSCULAR | Status: DC | PRN

## 2020-03-10 MED ORDER — HYDROMORPHONE HCL 1 MG/ML IJ SOLN
0.2500 mg | Freq: Four times a day (QID) | INTRAMUSCULAR | Status: DC | PRN
  Administered 2020-03-11 – 2020-03-12 (×4): 0.25 mg via INTRAVENOUS
  Filled 2020-03-10 (×6): qty 0.5

## 2020-03-10 NOTE — Consult Note (Signed)
[facility] Oncology Progress Note  Name: Lindsey Mullins      MRN: [id number]    Location: W098/J191-47  Date: 03/10/2020 Time:5:50 PM   Subjective: Interval History:Lindsey Mullins is seen today for follow-up.  Daughter at bedside.  Patient reportedly received Dilaudid and has been slightly drowsy.  She was able to drink clear liquids, MiraLAX and some boost without any problems.  She reportedly had a good bowel movement last night and some this morning also.  Objective: Vital signs in last 24 hours: Temp:  [97.6 F (36.4 C)] 97.6 F (36.4 C) (04/22 0548) Pulse Rate:  [80-83] 80 (04/22 1654) Resp:  [16-18] 16 (04/22 1654) BP: (134-141)/(80-81) 141/80 (04/22 1654) SpO2:  [91 %-96 %] 94 % (04/22 1654)    Intake/Output from previous day: 04/21 0800 - 04/22 0759 In: 1052.8 [P.O.:280; I.V.:772.8] Out: -     Intake/Output this shift: No intake/output data recorded.   PHYSICAL EXAM: BP (!) 141/80 (BP Location: Left Arm)   Pulse 80   Temp 97.6 F (36.4 C) (Oral)   Resp 16   Ht 5\' 6"  (1.676 m)   Wt 163 lb 12.8 oz (74.3 kg)   SpO2 94%   BMI 26.44 kg/m  General appearance: alert, cooperative and no distress Head: Normocephalic, without obvious abnormality, atraumatic Eyes: Normal conjunctiva. Rest of the physical exam was deferred.  Studies/Results: Results for orders placed or performed during the hospital encounter of 03/08/20 (from the past 48 hour(s))  TSH     Status: Abnormal   Collection Time: 03/10/20 12:52 PM  Result Value Ref Range   TSH 41.784 (H) 0.350 - 4.500 uIU/mL    Comment: Performed by a 3rd Generation assay with a functional sensitivity of <=0.01 uIU/mL. Performed at [facility], [address], [place], [place] [address]    No results found.   MEDICATIONS: I have reviewed the patient's current medications.     Assessment/Plan:  1.  Dysphagia: -This is from extrinsic compression of the esophagus at the level of the carina. -I have  talked to Dr. Laural Golden about a palliative stent.  He has reached out to Dr. Rush Landmark who has kindly agreed to do the procedure on Monday.  I have also called and talked to Dr. Rush Landmark. -He would like the patient to be transferred to [facility] on Sunday and have her evaluated by on-call GI physician.  Dr. Rush Landmark will be able to do the palliative stent on Monday. -I discussed with the patient and her daughter about the possibility of stent migration. -Her daughter [name] will also be coming to visit her from [place].  They will discuss together and make all the decisions.  2.  Back pain and abdominal pain: -She will continue Dilaudid as needed.  3.  Constipation: -She will continue Movantik and MiraLAX.  She is having bowel movements at this time.  4.  Metastatic lung cancer: -Recent CT on 03/06/2020 showed omental caking of the left upper quadrant and anterior abdomen.  There is also hepatic metastatic disease and increased size of the hypodense splenic lesion. -Patient wants to continue to fight and does not want to give up.  I have made it clear that she is not in any shape to receive chemotherapy at this time. -Patient will be discharged home after stent placement.  All questions were answered. The patient knows to call the clinic with any problems, questions or concerns. We can certainly see the patient much sooner if necessary.  Derek Jack

## 2020-03-10 NOTE — Progress Notes (Signed)
PROGRESS NOTE    Lindsey Mullins  BZJ:[id number] DOB: 20-Jan-1946 DOA: 03/08/2020 PCP: [name], MD   Brief Narrative:  Per HPI: Lindsey Dauphin Perkinsis a 74 y.o.femalewith medical history significant formetastatic lung cancer,hypertension hypothyroidism. Patient was sent to the ED from the cancer center with reports of generalized weakness, abdominal and back pain and constipation. Patient was in the ED 2 days ago 4/18 for similar problems, abdominal CT was without evidence of perforation or bowel obstruction, but showed increase in size of hepatic metastatic disease,showed moderate stool burden in ascending and proximal transverse colon. She was discharged home with a bowel regimen to include magnesium citrate.  Patient also called her oncologist, and was prescribed Movantik. Patient last bowel movement was this morning, which was a small amount. Daughter at bedside who helps with the history and who takes care of patientsudden decline, patient only able to take very thin liquids, plain Ensure.Unable to walk without assistance. Generally weak. Minimal exertion results in difficulty breathing and wheezing. Any minimal movement aggravates the pain in her back. Daughter,Lindsey Mullins at bedside is the only caregiver for now.  4/21: Patient has been admitted with failure to thrive in the setting of metastatic lung cancer and is also noted to have some hyponatremia.  She has had EGD on 4/16 which has demonstrated severe extrinsic stenosis of the esophagus.  Palliative consult appreciated to determine goals of care.  PT recommending SNF.  Questionable whether family and patient would desire feeding tube at this point.  4/22: Patient continues to remain on thin liquid diet and has noted weakness.  Pain appears controlled and she seems more lucid today.  Family members and patient quite unsure of where they would like for treatment to head at this point.  Palliative care to have further  discussions with family today.  Assessment & Plan:   Principal Problem:   Failure to thrive (0-17) Active Problems:   Squamous cell carcinoma of lung, stage IV, right (HCC)   Malignant neoplasm of right lung (H[facility])   Failure to thrive in adult   Palliative care by specialist   Left low back pain   Failure to thrive in the setting of metastatic lung cancer -Appreciate palliative care consultation-ongoing -Appreciate oncology evaluation -Continue nutritional supplementation per dietitian with boost breeze 3 times daily -SLP evaluation for thin liquids -PT recommending SNF and patient unsure of SNF at this point  Dysphagia -SLP evaluation with thin liquids recommended for now -Recent EGD 4/16 with extrinsic compression of the esophagus noted -Further discussion with GI regarding palliative stent pending  Hyponatremia -Likely secondary to poor intake as well as lung cancer with SIADH -Appears to be improving right now with normal saline, continue to follow -Order further studies pending for a.m.  Constipation -Continue Movantik -Enemas as needed -Senokot twice daily  Hypothyroidism -Recent TSH 77.829 on 3/21 -Continue levothyroxine -Recheck TSH as well as free T4  Hypertension -IV labetalol as needed, but currently controlled  DVT prophylaxis: Lovenox Code Status: Full code Family Communication:  Discussed with daughter at bedside Disposition Plan: Elaboration on goals of care per palliative discussion pending.  Potential need for SNF/rehab as well as feeding tube if full management desired.  Family still deciding next course of action.   Consultants:   Palliative care  Oncology  Procedures:   None  Antimicrobials:   None  Subjective: Patient seen and evaluated today with no new acute complaints or concerns. No acute concerns or events noted overnight.  She appears lethargic,  but is alert enough to answer questions.  Objective: Vitals:    03/09/20 1430 03/09/20 2217 03/10/20 0548 03/10/20 0900  BP: 133/84 134/81 137/81   Pulse: 82 83 81   Resp:  18 16   Temp: (!) 97.4 F (36.3 C) 97.6 F (36.4 C) 97.6 F (36.4 C)   TempSrc: Oral  Oral   SpO2: 95% 96% 91% 93%  Weight:      Height:        Intake/Output Summary (Last 24 hours) at 03/10/2020 1100 Last data filed at 03/10/2020 0600 Gross per 24 hour  Intake 1052.84 ml  Output --  Net 1052.84 ml   Filed Weights   03/08/20 1139 03/08/20 1815  Weight: 73.5 kg 74.3 kg    Examination:  General exam: Appears calm and comfortable  Respiratory system: Clear to auscultation. Respiratory effort normal. Cardiovascular system: S1 & S2 heard, RRR. No JVD, murmurs, rubs, gallops or clicks. No pedal edema. Gastrointestinal system: Abdomen is nondistended, soft and nontender. No organomegaly or masses felt. Normal bowel sounds heard. Central nervous system: Alert and oriented. No focal neurological deficits. Extremities: Symmetric 5 x 5 power. Skin: No rashes, lesions or ulcers Psychiatry: Flat affect    Data Reviewed: I have personally reviewed following labs and imaging studies  CBC: Recent Labs  Lab 03/06/20 2137 03/08/20 1356  WBC 6.6 7.1  NEUTROABS  --  5.3  HGB 11.0* 10.2*  HCT 34.6* 31.6*  MCV 96.6 96.0  PLT 304 604   Basic Metabolic Panel: Recent Labs  Lab 03/06/20 2137 03/08/20 1356  NA 127* 131*  K 4.6 4.5  CL 91* 91*  CO2 22 26  GLUCOSE 92 88  BUN 11 11  CREATININE 0.65 0.67  CALCIUM 8.6* 9.0  MG  --  2.1   GFR: Estimated Creatinine Clearance: 65.5 mL/min (by C-G formula based on SCr of 0.67 mg/dL). Liver Function Tests: Recent Labs  Lab 03/06/20 2137 03/08/20 1356  AST 55* 51*  ALT 17 15  ALKPHOS 126 136*  BILITOT 1.8* 1.3*  PROT 7.5 7.5  ALBUMIN 3.4* 3.4*   Recent Labs  Lab 03/06/20 2137  LIPASE 36   No results for input(s): AMMONIA in the last 168 hours. Coagulation Profile: No results for input(s): INR, PROTIME in the  last 168 hours. Cardiac Enzymes: No results for input(s): CKTOTAL, CKMB, CKMBINDEX, TROPONINI in the last 168 hours. BNP (last 3 results) No results for input(s): PROBNP in the last 8760 hours. HbA1C: No results for input(s): HGBA1C in the last 72 hours. CBG: No results for input(s): GLUCAP in the last 168 hours. Lipid Profile: No results for input(s): CHOL, HDL, LDLCALC, TRIG, CHOLHDL, LDLDIRECT in the last 72 hours. Thyroid Function Tests: No results for input(s): TSH, T4TOTAL, FREET4, T3FREE, THYROIDAB in the last 72 hours. Anemia Panel: No results for input(s): VITAMINB12, FOLATE, FERRITIN, TIBC, IRON, RETICCTPCT in the last 72 hours. Sepsis Labs: No results for input(s): PROCALCITON, LATICACIDVEN in the last 168 hours.  Recent Results (from the past 240 hour(s))  SARS CORONAVIRUS 2 (TAT 6-24 HRS) Nasopharyngeal Nasopharyngeal Swab     Status: None   Collection Time: 03/03/20  7:12 AM   Specimen: Nasopharyngeal Swab  Result Value Ref Range Status   SARS Coronavirus 2 NEGATIVE NEGATIVE Final    Comment: (NOTE) SARS-CoV-2 target nucleic acids are NOT DETECTED. The SARS-CoV-2 RNA is generally detectable in upper and lower respiratory specimens during the acute phase of infection. Negative results do not preclude SARS-CoV-2 infection, do  not rule out co-infections with other pathogens, and should not be used as the sole basis for treatment or other patient management decisions. Negative results must be combined with clinical observations, patient history, and epidemiological information. The expected result is Negative. Fact Sheet for Patients: SugarRoll.be Fact Sheet for Healthcare Providers: https://www.woods-mathews.com/ This test is not yet approved or cleared by the [place] FDA and  has been authorized for detection and/or diagnosis of SARS-CoV-2 by FDA under an Emergency Use Authorization (EUA). This EUA will remain  in  effect (meaning this test can be used) for the duration of the COVID-19 declaration under Section 56 4(b)(1) of the Act, 21 U.S.C. section 360bbb-3(b)(1), unless the authorization is terminated or revoked sooner. Performed at [facility], [place] [address] [place], [place] [address]   SARS CORONAVIRUS 2 (TAT 6-24 HRS) Nasopharyngeal Urine, Clean Catch     Status: None   Collection Time: 03/08/20  2:14 PM   Specimen: Urine, Clean Catch; Nasopharyngeal  Result Value Ref Range Status   SARS Coronavirus 2 NEGATIVE NEGATIVE Final    Comment: (NOTE) SARS-CoV-2 target nucleic acids are NOT DETECTED. The SARS-CoV-2 RNA is generally detectable in upper and lower respiratory specimens during the acute phase of infection. Negative results do not preclude SARS-CoV-2 infection, do not rule out co-infections with other pathogens, and should not be used as the sole basis for treatment or other patient management decisions. Negative results must be combined with clinical observations, patient history, and epidemiological information. The expected result is Negative. Fact Sheet for Patients: SugarRoll.be Fact Sheet for Healthcare Providers: https://www.woods-mathews.com/ This test is not yet approved or cleared by the [place] FDA and  has been authorized for detection and/or diagnosis of SARS-CoV-2 by FDA under an Emergency Use Authorization (EUA). This EUA will remain  in effect (meaning this test can be used) for the duration of the COVID-19 declaration under Section 56 4(b)(1) of the Act, 21 U.S.C. section 360bbb-3(b)(1), unless the authorization is terminated or revoked sooner. Performed at [facility], [place] [address], [place],  [address]          Radiology Studies: No results found.      Scheduled Meds: . enoxaparin (LOVENOX) injection  40 mg Subcutaneous Q24H  . feeding supplement  1 Container Oral TID BM  .  feeding supplement (KATE FARMS STANDARD 1.4)  325 mL Oral Q24H  . levothyroxine  50 mcg Oral QAC breakfast  . magnesium citrate  1 Bottle Oral Once  . naloxegol oxalate  25 mg Oral Daily  . pantoprazole (PROTONIX) IV  40 mg Intravenous Q24H  . polyethylene glycol  17 g Oral BID  . senna-docusate  1 tablet Oral BID   Continuous Infusions: . dextrose 5 % and 0.9% NaCl 75 mL/hr at 03/10/20 1003     LOS: 1 day    Time spent: 30 minutes    Lindsey Mullins Darleen Crocker, DO [facility]  If 7PM-7AM, please contact night-coverage [email or web address] 03/10/2020, 11:00 AM

## 2020-03-10 NOTE — Progress Notes (Signed)
Palliative: Mrs. Cutright is lying quietly in bed.  Nursing staff shares that she received IV Dilaudid approximately 30 minutes ago.  She is resting comfortably, and I do not try to wake her.  Present at bedside is daughter Velva Harman and an older female.  Velva Harman and I talked about the treatment plan in detail.  She is very knowledgeable about the treatment plan, able to verbalize accurately.  She voices no questions or concerns at this time.  Velva Harman shares that she was able to speak with her mother who has shared her wishes.  Velva Harman states that she is not ready to "give up".  They are waiting for her sister, [name], who will arrive from [place] on Saturday for further decision-making.    We talked about what it may look like when Mrs. Kyer decides that she no longer wants further treatments or interventions.  I shared that this is not failure, or giving up, but a natural part of life.  Velva Harman shares that Mrs. Hixon has told family that she does not feel that she is spiritually ready to pass away.  We talked about symptom management.  Mrs. Dehner has been taking IV Dilaudid for severe pain, her dose was reduced yesterday to 0.5 mg IV.  Velva Harman and I talked about transitioning back to by mouth medications, but unfortunately by mouth/liquid Dilaudid is not available.  Discussed pain management with attending who adjusted medication for pain.  Conference with attending, bedside nursing staff, transition of care team related to patient condition, needs, symptom management, goals of care.  Chaplaincy support requested.  Plan:  At this point, continue full scope/full code.  Continue to take chemo and radiation treatments as offered.  Agreeable to esophageal stenting if possible, anticipate that patient and family would also be agreeable to PEG tube placement if necessary/offered.  Continue to follow-up with oncology outpatient.  79 minutes Quinn Axe, NP Palliative Medicine Team Team Phone # [phone] Greater than  50% of this time was spent counseling and coordinating care related to the above assessment and plan.

## 2020-03-11 LAB — BASIC METABOLIC PANEL
Anion gap: 11 (ref 5–15)
BUN: 6 mg/dL — ABNORMAL LOW (ref 8–23)
CO2: 23 mmol/L (ref 22–32)
Calcium: 8 mg/dL — ABNORMAL LOW (ref 8.9–10.3)
Chloride: 99 mmol/L (ref 98–111)
Creatinine, Ser: 0.57 mg/dL (ref 0.44–1.00)
GFR calc Af Amer: >60 mL/min (ref >60)
GFR calc non Af Amer: >60 mL/min (ref >60)
Glucose, Bld: 105 mg/dL — ABNORMAL HIGH (ref 70–99)
Potassium: 2.9 mmol/L — ABNORMAL LOW (ref 3.5–5.1)
Sodium: 133 mmol/L — ABNORMAL LOW (ref 135–145)

## 2020-03-11 MED ORDER — HYDROMORPHONE HCL 1 MG/ML IJ SOLN
0.5000 mg | Freq: Once | INTRAMUSCULAR | Status: AC
  Administered 2020-03-11: 0.5 mg via INTRAVENOUS

## 2020-03-11 MED ORDER — LEVOTHYROXINE SODIUM 75 MCG PO TABS
75.0000 ug | ORAL_TABLET | Freq: Every day | ORAL | Status: DC
  Administered 2020-03-12 – 2020-03-18 (×5): 75 ug via ORAL
  Filled 2020-03-11 (×6): qty 1

## 2020-03-11 NOTE — ED Provider Notes (Signed)
[facility] Provider Note   CSN: [id number] Arrival date & time: 03/08/20  1128     History Chief Complaint  Patient presents with  . Constipation    Lindsey Mullins is a 74 y.o. female.  HPI    Lindsey Mullins is a 74 y.o. female with medical history significant for metastatic lung cancer, hypertension hypothyroidism.  Patient was sent to the ED from the cancer center with reports of generalized weakness, abdominal and back pain and constipation.  Patient was in the ED 2 days ago 4/18 for similar problems, abdominal CT was without evidence of perforation or bowel obstruction, but showed increase in size of hepatic metastatic disease, showed moderate stool burden in ascending and proximal transverse colon.  She was discharged home with a bowel regimen to include magnesium citrate.   Patient also called her oncologist, and was prescribed Movantik.  Patient last bowel movement was this morning, which was a small amount. Daughter at bedside who helps with the history and who takes care of patient sudden decline, patient only able to take very thin liquids, plain Ensure.  Unable to walk without assistance.  Generally weak.  Minimal exertion results in difficulty breathing and wheezing.  Any minimal movement aggravates the pain in her back. Daughter, Lindsey Mullins at bedside is the only caregiver for now.  Past Medical History:  Diagnosis Date  . Atypical chest pain 05/23/2012   STRESS TEST - small to moderate sized area of partial reversibility of the anteroapical wall, most likely breast artifact, post-stress EF 67%, EKG show NSR at 65, No Lexiscan EKG changes, non-diagnostic for ischemia; STRESS TEST, 01/25/2010 - normal study, post-stress EF 66%, no significant ischemia  . Cancer ([place])   . Cerebral atherosclerosis 09/29/2012   CAROTID DUPLEX - RIGHT  BULB/PROXIMAL ICA-moderate amount of fibrous plaque, 50-69% diameter reduction; LEFT CEA-normal, no significant diameter  reduction  . Colitis   . GERD (gastroesophageal reflux disease)   . Hyperlipidemia   . Hypertension   . Osteopenia   . Restless leg syndrome   . Shortness of breath 03/30/2005   2D ECHO - EF >55%, normal  . TIA (transient ischemic attack) 01/27/2010   2D ECHO - EF 65%, normal    Patient Active Problem List   Diagnosis Date Noted  . Failure to thrive in adult 03/09/2020  . Palliative care by specialist   . Left low back pain   . Failure to thrive (0-17) 03/08/2020  . Hypothyroidism 01/26/2020  . Peripheral edema 09/11/2019  . Malignant neoplasm of right lung ([place])   . Goals of care, counseling/discussion 06/10/2019  . Squamous cell carcinoma of lung, stage IV, right ([place]) 05/28/2019  . Metastatic cancer ([place]) 05/26/2019  . Cervical lymphadenopathy   . Compression fracture of L2 ([place]) 04/29/2019  . Arteriosclerosis, mesenteric artery ([place]) 12/31/2018  . Abdominal pain 12/22/2018  . Colitis 12/22/2018  . Osteopenia   . DDD (degenerative disc disease), lumbar 05/11/2016  . Obesity 05/11/2016  . Glucose intolerance (impaired glucose tolerance) 01/12/2016  . Pain in the chest   . Chest pain 01/10/2016  . GERD (gastroesophageal reflux disease) 01/26/2015  . Stress and adjustment reaction 01/26/2015  . Carotid artery disease ([place]) 04/20/2013  . Bunion, left 09/21/2012  . Overweight(278.02) 06/04/2012  . Essential hypertension, benign 04/02/2012  . Hyperlipidemia 04/02/2012  . Dysphagia 04/02/2012  . RLS (restless legs syndrome) 04/02/2012    Past Surgical History:  Procedure Laterality Date  . ABDOMINAL EXPLORATION SURGERY    .  CAROTID ENDARTERECTOMY    . ESOPHAGOGASTRODUODENOSCOPY (EGD) WITH PROPOFOL N/A 03/04/2020   Procedure: ESOPHAGOGASTRODUODENOSCOPY (EGD) WITH PROPOFOL;  Surgeon: Rogene Houston, MD;  Location: [facility];  Service: Endoscopy;  Laterality: N/A;  210  . LYMPH NODE BIOPSY N/A 05/20/2019   Procedure: LYMPH NODE BIOPSY CERVICAL;  Surgeon: Aviva Signs,  MD;  Location: [facility] ORS;  Service: General;  Laterality: N/A;  . Peripheral Vascular Catheterization  07/16/2005   Right verterbral-50% smooth segmental badsilar artery stenosis on right side, Right carotid-50% ulcerated proxmial stenosis, Left carotid-60 to 70% left externam carotid artery stenosis, 90% proximal left ICA stenosis  . PORTACATH PLACEMENT Left 06/12/2019   Procedure: INSERTION PORT-A-CATH (catheter attached left subclavian);  Surgeon: Aviva Signs, MD;  Location: [facility] ORS;  Service: General;  Laterality: Left;    OB History    Gravida  2   Para  2   Term  2   Preterm      AB      Living        SAB      TAB      Ectopic      Multiple      Live Births             Family History  Problem Relation Age of Onset  . Heart disease Mother   . Hypertension Mother   . Hypertension Father   . Heart disease Father    Social History   Tobacco Use  . Smoking status: Former Smoker    Packs/day: 0.25    Years: 10.00    Pack years: 2.50    Types: Cigarettes    Quit date: 04/21/1983    Years since quitting: 36.9  . Smokeless tobacco: Never Used  Substance Use Topics  . Alcohol use: No  . Drug use: No   Home Medications Prior to Admission medications   Medication Sig Start Date End Date Taking? Authorizing Provider  ciprofloxacin (CIPRO) 250 MG tablet Take 1 tablet (250 mg total) by mouth 2 (two) times daily. 03/01/20  Yes [place], Modena Nunnery, MD  HYDROmorphone (DILAUDID) 2 MG tablet Take 0.5 tablets (1 mg total) by mouth every 4 (four) hours as needed for severe pain. 03/03/20  Yes Derek Jack, MD  levothyroxine (SYNTHROID) 50 MCG tablet Take 50 mcg by mouth daily before breakfast.   Yes [provider]  naloxegol oxalate (MOVANTIK) 25 MG TABS tablet Take 1 tablet (25 mg total) by mouth daily. 03/07/20  Yes Derek Jack, MD  nitroGLYCERIN (NITROSTAT) 0.4 MG SL tablet DISSOLVE ONE TABLET UNDER THE TONGUE AS NEEDED FOR CHEST PAIN**MAX 3 TABLETS  EACH 5 MINUTES APART** 09/14/16  Yes [place], Modena Nunnery, MD  omeprazole (PRILOSEC) 20 MG capsule Take 1 po daily Patient taking differently: Take 20 mg by mouth daily.  12/31/18  Yes [place], Modena Nunnery, MD  losartan (COZAAR) 50 MG tablet Take 25 mg by mouth daily.    [provider]  rosuvastatin (CRESTOR) 20 MG tablet Take 20 mg by mouth daily.    [provider]   Allergies    Penicillins, Codeine, Lipitor [atorvastatin], Lisinopril, and Pravastatin  Review of Systems   Review of Systems All systems reviewed and negative, other than as noted in HPI.  Physical Exam Updated Vital Signs BP (!) 158/98 (BP Location: Right Arm)   Pulse 84   Temp 99.1 F (37.3 C) (Oral)   Resp 16   Ht 5\' 6"  (1.676 m)   Wt 74.3 kg  SpO2 96%   BMI 26.44 kg/m   Physical Exam Vitals and nursing note reviewed.  Constitutional:      General: She is not in acute distress.    Appearance: She is well-developed.     Comments: Laying in bed. Appears tired but NAD.   HENT:     Head: Normocephalic and atraumatic.  Eyes:     General:        Right eye: No discharge.        Left eye: No discharge.     Conjunctiva/sclera: Conjunctivae normal.  Cardiovascular:     Rate and Rhythm: Normal rate and regular rhythm.     Heart sounds: Normal heart sounds. No murmur. No friction rub. No gallop.   Pulmonary:     Effort: Pulmonary effort is normal. No respiratory distress.     Breath sounds: Normal breath sounds.  Abdominal:     General: There is no distension.     Palpations: Abdomen is soft.     Tenderness: There is no abdominal tenderness.  Musculoskeletal:        General: No tenderness.     Cervical back: Neck supple.  Skin:    General: Skin is warm and dry.  Neurological:     Mental Status: She is alert.  Psychiatric:        Behavior: Behavior normal.        Thought Content: Thought content normal.     ED Results / Procedures / Treatments   Labs (all labs ordered are listed, but  only abnormal results are displayed) Labs Reviewed  COMPREHENSIVE METABOLIC PANEL - Abnormal; Notable for the following components:      Result Value   Sodium 131 (*)    Chloride 91 (*)    Albumin 3.4 (*)    AST 51 (*)    Alkaline Phosphatase 136 (*)    Total Bilirubin 1.3 (*)    All other components within normal limits  CBC WITH DIFFERENTIAL/PLATELET - Abnormal; Notable for the following components:   RBC 3.29 (*)    Hemoglobin 10.2 (*)    HCT 31.6 (*)    RDW 16.8 (*)    All other components within normal limits  URINALYSIS, ROUTINE W REFLEX MICROSCOPIC - Abnormal; Notable for the following components:   Ketones, ur 20 (*)    Protein, ur 30 (*)    Bacteria, UA RARE (*)    All other components within normal limits  TSH - Abnormal; Notable for the following components:   TSH 41.784 (*)    All other components within normal limits  T4, FREE - Abnormal; Notable for the following components:   Free T4 0.46 (*)    All other components within normal limits  BASIC METABOLIC PANEL - Abnormal; Notable for the following components:   Sodium 133 (*)    Potassium 2.9 (*)    Glucose, Bld 105 (*)    BUN 6 (*)    Calcium 8.0 (*)    All other components within normal limits  SARS CORONAVIRUS 2 (TAT 6-24 HRS)  MAGNESIUM    EKG None  Radiology No results found.   CT CHEST W CONTRAST  Result Date: 03/04/2020 CLINICAL DATA:  Non-small cell lung cancer, post first-line therapy, assessment for treatment response. EXAM: CT CHEST WITH CONTRAST TECHNIQUE: Multidetector CT imaging of the chest was performed during intravenous contrast administration. CONTRAST:  26mL OMNIPAQUE IOHEXOL 300 MG/ML  SOLN COMPARISON:  10/26/2019 FINDINGS: Cardiovascular: Coronary, aortic arch, and branch vessel  atherosclerotic vascular disease. Moderate pericardial effusion, primarily accumulated posteriorly, new compared to prior. Mediastinum/Nodes: Compared to prior chest CT, there is significantly worsened  bilateral neck level IV adenopathy, left subpectoral adenopathy, prevascular adenopathy, AP window adenopathy, right internal mammary, bilateral hilar adenopathy, paratracheal adenopathy, and subcarinal adenopathy. Index AP window lymph node 1.4 cm in short axis on image 58/2, previously 0.4 cm. Index prevascular node 1.2 cm in short axis on image 50/2, new. Index left level IV lymph node 2.1 cm in short axis on image 18/2, new. Index subpectoral lymph node on the left, 1.1 cm on image 33/2, previously 0.5 cm. Index left hilar node 1.6 cm in short axis on image 72/2. The subcarinal adenopathy is difficult to separate from the esophagus. This measures about 2.3 cm in short axis on image 73/2 (formerly 1.0 cm), and invasion of the esophageal wall is not excluded given the lack of fat planes between this somewhat irregular adenopathy and the esophagus. An underlying esophageal mass is not excluded. Esophagus above this level is mildly dilated by fluid, but at this level adopts only soft tissue density. This extends along an approximately 5.7 cm segment of the mid to distal esophagus. Lungs/Pleura: Small to moderate right pleural effusion nonspecific for transudative, exudative, or malignant etiology. Mild interstitial accentuation bilaterally with secondary pulmonary lobular thickening and airway thickening. In the right lower lobe, there is an irregular band of density much of which is likely volume loss although underlying tumor is difficult to confidently exclude in this setting. This extends to the infrahilar adenopathy. Calcified pleural plaque on the left. Mild paraseptal emphysema. New 5 mm right upper lobe pulmonary nodule on image 39/4. New 5 by 4 mm right middle lobe pulmonary nodule on image 112/4. Upper Abdomen: Considerable upper abdominal ascites. New and enlarging hepatic metastatic lesions including a new 1.0 cm hypodense lesion posteriorly in the right hepatic lobe on image 118/2. A hypodense lesion in  the right hepatic lobe on image 126/2 measures 1.8 by 1.8 cm, previously about 0.8 cm in diameter. A hypodense lesion in segment 4 of the liver previously poorly seen measures 1.2 by 1.0 cm on image 143/2. A complex splenic lesion favoring metastatic lesion has enlarged, currently 3.0 by 2.2 cm, previously 1.3 by 1.1 cm. Nonspecific fullness of the visualized portion of the left adrenal gland appears increased from 10/26/2019 early adrenal metastatic lesion not excluded. Mildly prominent right gastric lymph nodes are present. Musculoskeletal: Scattered sclerotic lesions in the thoracic spine are present, most strikingly at the T5 level as on the prior MRI from 01/15/2020. MRI better depicts these lesions superior to CT. There is a faint sclerotic lesion in the sternal body measuring 1.1 cm craniocaudad on image 100/6. IMPRESSION: 1. Significant progression of adenopathy especially in the bilateral neck level IV, left subpectoral, bilateral hilar, paratracheal, subcarinal, and subcarinal regions. Possible invasion of the esophageal wall by tumor in the mid to distal esophagus. 2. Small to moderate right pleural effusion nonspecific for transudative, exudative, or malignant etiology. 3. New and enlarging hepatic and splenic metastatic lesions. 4. New 5 mm right upper lobe and 5 by 4 mm right middle lobe pulmonary nodules, nonspecific for metastatic lesions. 5. Considerable upper abdominal ascites. 6. Coronary, aortic arch, and branch vessel atherosclerotic vascular disease. 7. Calcified pleural plaque on the left, query prior asbestos exposure. 8. Aortic atherosclerosis. 9. Nonspecific fullness of the visualized portion of the left adrenal gland appears increased from 10/26/2019 early adrenal metastatic lesion not excluded. 10. Scattered sclerotic  lesions in the thoracic spine, most strikingly at the T5 level as on the prior MRI from 01/15/2020. MRI better depicts these lesions. Aortic Atherosclerosis (ICD10-I70.0)  and Emphysema (ICD10-J43.9). Electronically Signed   By: Van Clines M.D.   On: 03/04/2020 18:44   CT ABDOMEN PELVIS W CONTRAST  Result Date: 03/06/2020 CLINICAL DATA:  Abdominal distension. Status post EGD 2 days ago. Pain. EXAM: CT ABDOMEN AND PELVIS WITH CONTRAST TECHNIQUE: Multidetector CT imaging of the abdomen and pelvis was performed using the standard protocol following bolus administration of intravenous contrast. CONTRAST:  146mL OMNIPAQUE IOHEXOL 300 MG/ML  SOLN COMPARISON:  Chest CT 2 days ago 03/04/2020. Abdominal CT 10/26/2019 FINDINGS: Lower chest: Right pleural effusion is unchanged from recent CT. Bandlike consolidation in the right lower lobe is unchanged. Heart is normal in size. There are coronary artery calcifications. Small pericardial effusion measuring up to 8 mm. Distal esophageal wall thickening. No paraesophageal stranding. No extraluminal air. Hepatobiliary: Multiple hypodense hepatic lesions consistent with metastatic disease. Largest lesion in the right lobe measures 18 mm. Distended gallbladder without calcified gallstone. Common bile duct is not well-defined, no evidence of biliary dilatation. Pancreas: Parenchymal atrophy. No ductal dilatation or inflammation. Spleen: Irregular hypodense lesion in the inferior spleen measures 2.5 cm, increased in size from prior. No splenomegaly. Adrenals/Urinary Tract: Normal right adrenal gland. Minimal left adrenal thickening without dominant nodule. No hydronephrosis or perinephric edema. Homogeneous renal enhancement with symmetric excretion on delayed phase imaging. Scattered cortical scarring in the right kidney. Urinary bladder is physiologically distended without wall thickening. Mild high density of the urine in the urinary bladder may be related to some residual excretion of IV contrast from recent chest CT. Stomach/Bowel: Mild wall thickening of the distal esophagus. No adjacent inflammation or extraluminal air. The stomach is  nondistended. No small bowel obstruction, small bowel is decompressed. The appendix is not definitively visualized, no evidence of appendicitis. Large volume of stool in the ascending and proximal transverse colon. Transverse colon is tortuous. Distal transverse and descending colon are decompressed. No colonic wall thickening. Vascular/Lymphatic: Moderate aorto bi-iliac atherosclerosis. Portal caval adenopathy, including 11 mm node series 2, image 32, new from prior abdominal CT. Increased number of multiple small lower retroperitoneal nodes, largest measures 8 mm, nonspecific. 8 mm right external iliac/inguinal node is nonspecific. Portal vein is patent. Reproductive: Multiple calcified uterine fibroids. Ascites limits assessment for adnexal mass. Ovaries tentatively visualized and quiescent. Other: Moderate volume abdominopelvic ascites. There is omental caking in the left upper quadrant and anterior abdomen most prominent just below the umbilicus, series 2, image 61. No free air. Musculoskeletal: Sclerotic osseous metastatic disease throughout the lumbar spine. Chronic compression deformity superior endplate of L2. Prominent Schmorl's node inferior endplate of L3. Patchy sclerosis involving the left proximal femur and bony pelvis. IMPRESSION: 1. No evidence of perforation post EGD.  No bowel obstruction. 2. Moderate volume abdominopelvic ascites. There is omental caking in the left upper quadrant and anterior abdomen. This is new from in December 2020 abdominal CT. 3. Increased size of hepatic metastatic disease. Increased size of hypodense splenic lesion suspicious for metastasis. Increased size of periportal and retroperitoneal nodes, thickening of the left adrenal gland, nonspecific but increased from prior exam. 4. Sclerotic osseous metastatic disease throughout the lumbar spine and bony pelvis. 5. Moderate stool in the ascending and proximal transverse colon. 6. Right pleural effusion is unchanged from  recent chest CT 2 days ago. Bandlike consolidation in the right lower lobe is unchanged. Aortic Atherosclerosis (ICD10-I70.0). Electronically  Signed   By: Keith Rake M.D.   On: 03/06/2020 22:54   DG Chest Portable 1 View  Result Date: 03/06/2020 CLINICAL DATA:  Epigastric pain. EGD 2 days ago. Evaluate for free air under the diaphragm. EXAM: PORTABLE CHEST 1 VIEW COMPARISON:  Chest CT 2 days ago 03/04/2020 FINDINGS: No evidence of free air under the hemidiaphragms. No visualized pneumomediastinum. Left chest port remains in place, tip in the SVC. No pneumothorax. Pleural effusion and right basilar opacity similar to recent chest CT. Normal heart size with unchanged mediastinal contours. Aortic atherosclerosis. No pulmonary edema. IMPRESSION: 1. No evidence of free air under the hemidiaphragms. No visualized pneumomediastinum. 2. Right basilar opacity and pleural effusion similar to recent chest CT. Electronically Signed   By: Keith Rake M.D.   On: 03/06/2020 21:27   DG Abd Portable 1 View  Result Date: 03/06/2020 CLINICAL DATA:  Evaluate for free air. Status post EGD 2 days ago with epigastric pain. EXAM: PORTABLE ABDOMEN - 1 VIEW COMPARISON:  None. FINDINGS: Portable AP upright view of the abdomen. No free air under the hemidiaphragms. No bowel dilatation to suggest obstruction. Moderate stool in the right colon. IMPRESSION: No free air under the hemidiaphragms. Nonobstructive bowel gas pattern. Electronically Signed   By: Keith Rake M.D.   On: 03/06/2020 21:28    Procedures Procedures (including critical care time)  Medications Ordered in ED Medications  levothyroxine (SYNTHROID) tablet 50 mcg (50 mcg Oral Given 03/11/20 0600)  naloxegol oxalate (MOVANTIK) tablet 25 mg (25 mg Oral Given 03/11/20 1001)  enoxaparin (LOVENOX) injection 40 mg (40 mg Subcutaneous Given 03/10/20 1844)  dextrose 5 %-0.9 % sodium chloride infusion ( Intravenous New Bag/Given 03/11/20 1002)  acetaminophen  (TYLENOL) tablet 650 mg (has no administration in time range)    Or  acetaminophen (TYLENOL) suppository 650 mg (has no administration in time range)  polyethylene glycol (MIRALAX / GLYCOLAX) packet 17 g (17 g Oral Given 03/08/20 2029)  magnesium citrate solution 1 Bottle (1 Bottle Oral Not Given 03/08/20 2052)  polyethylene glycol (MIRALAX / GLYCOLAX) packet 17 g (17 g Oral Given 03/11/20 0916)  senna-docusate (Senokot-S) tablet 1 tablet (1 tablet Oral Given 03/11/20 0916)  labetalol (NORMODYNE) injection 5 mg (has no administration in time range)  feeding supplement (BOOST / RESOURCE BREEZE) liquid 1 Container (1 Container Oral Given 03/11/20 0956)  pantoprazole (PROTONIX) injection 40 mg (40 mg Intravenous Given 03/10/20 2152)  feeding supplement ([place] STANDARD 1.4) liquid 325 mL (325 mLs Oral Given 03/10/20 1315)  HYDROmorphone (DILAUDID) injection 0.25 mg (0.25 mg Intravenous Given 03/11/20 0912)  polyethylene glycol (MIRALAX / GLYCOLAX) packet 34 g (34 g Oral Given 03/08/20 1442)  senna-docusate (Senokot-S) tablet 1 tablet (1 tablet Oral Given 03/08/20 1440)  fosfomycin (MONUROL) packet 3 g (3 g Oral Given 03/08/20 2023)  ondansetron (ZOFRAN) injection 4 mg (4 mg Intravenous Given 03/09/20 9211)    ED Course  I have reviewed the triage vital signs and the nursing notes.  Pertinent labs & imaging results that were available during my care of the patient were reviewed by me and considered in my medical decision making (see chart for details).    MDM Rules/Calculators/A&P                      73yF with failure to thrive. Worsening metastatic disease. May need placement.  Final Clinical Impression(s) / ED Diagnoses Final diagnoses:  Metastatic malignant neoplasm, unspecified site [facility])    Rx /  DC Orders ED Discharge Orders    None       Virgel Manifold, MD 03/11/20 1100

## 2020-03-11 NOTE — Progress Notes (Signed)
SLP Cancellation Note  Patient Details Name: Lindsey Mullins MRN: [id number] DOB: [birth year]   Cancelled treatment:       Reason Eval/Treat Not Completed: Fatigue/lethargy limiting ability to participate. Pt's daughter requested Pt not be roused for trials. Daughter reports Pt continues to consume clear liquids without incident and that Pt will be transferred to [facility] on Sunday. There are no further ST needs noted at this time. Please re-consult if indicated after palliative stent is placed. Thank you,  Alegandro Macnaughton H. Roddie Mc, CCC-SLP Speech Language Pathologist    Wende Bushy 03/11/2020, 11:06 AM

## 2020-03-11 NOTE — Progress Notes (Signed)
MD notified of patient and daughter request for q4 pain medication order instead of q6. Pt stated previous MD lowered the dose of the medication and was going to increase frequency.

## 2020-03-11 NOTE — Progress Notes (Signed)
PROGRESS NOTE    Lindsey Mullins  ELF:[id number] DOB: Sep 28, 1946 DOA: 03/08/2020 PCP: [name], MD   Brief Narrative:  Per HPI: Lindsey Mullins a 75 y.o.femalewith medical history significant formetastatic lung cancer,hypertension hypothyroidism. Patient was sent to the ED from the cancer center with reports of generalized weakness, abdominal and back pain and constipation. Patient was in the ED 2 days ago 4/18 for similar problems, abdominal CT was without evidence of perforation or bowel obstruction, but showed increase in size of hepatic metastatic disease,showed moderate stool burden in ascending and proximal transverse colon. She was discharged home with a bowel regimen to include magnesium citrate.  Patient also called her oncologist, and was prescribed Movantik. Patient last bowel movement was this morning, which was a small amount. Daughter at bedside who helps with the history and who takes care of patientsudden decline, patient only able to take very thin liquids, plain Ensure.Unable to walk without assistance. Generally weak. Minimal exertion results in difficulty breathing and wheezing. Any minimal movement aggravates the pain in her back. Daughter,Lindsey Mullins at bedside is the only caregiver for now.  4/21:Patient has been admitted with failure to thrive in the setting of metastatic lung cancer and is also noted to have some hyponatremia. She has had EGD on 4/16 which has demonstrated severe extrinsic stenosis of the esophagus. Palliative consult appreciated to determine goals of care. PT recommending SNF. Questionable whether family and patient would desire feeding tube at this point.  4/22: Patient continues to remain on thin liquid diet and has noted weakness.  Pain appears controlled and she seems more lucid today.  Family members and patient quite unsure of where they would like for treatment to head at this point.  Palliative care to have further  discussions with family today.  4/23: Patient seen at the bedside this morning and continues to remain quite lethargic.  She appears to be tolerating her clear liquid diet.  SLP reevaluation declined by family members.  Plans for transfer for palliative esophageal stent placement on 4/26.  Assessment & Plan:   Principal Problem:   Failure to thrive (0-17) Active Problems:   Squamous cell carcinoma of lung, stage IV, right (HCC)   Malignant neoplasm of right lung (HCC)   Failure to thrive in adult   Palliative care by specialist   Left low back pain   Failure to thrive in the setting of metastatic lung cancer -Appreciate palliative care consultation-ongoing -Appreciate oncology evaluation -Continue nutritional supplementation per dietitian with boost breeze 3 times daily -SLP evaluation recommending thin liquids -PT recommending SNF, but patient unsure of SNF at this point  Dysphagia -SLP evaluation with thin liquids recommended for now -Recent EGD 4/16 with extrinsic compression of the esophagus noted -Further discussion with GI regarding palliative stent pending  Hyponatremia -Likely secondary to poor intake as well as lung cancer with SIADH -Appears to be improving right now with normal saline, continue to follow -Order further studies pending for a.m.  Constipation -Continue Movantik -Enemas as needed -Senokot twice daily  Hypothyroidism-uncontrolled -Recent TSH 77.829 on 3/21 -Continue levothyroxine, but increase dose to 75 mcg -Recheck TSH 41.74 and free T4 0.46  Hypertension -IV labetalol as needed, but currently controlled  DVT prophylaxis:Lovenox Code Status:Full code Family Communication: Discussed with daughter at bedside Disposition Plan:Elaboration on goals of care per palliative discussion pending. Potential need for SNF/rehab as well as feeding tube if full management desired.  Family still deciding next course of  action.   Consultants:  Palliative care  Oncology  Procedures:  None  Antimicrobials:   None  Subjective: Patient seen and evaluated today with no new acute complaints or concerns. No acute concerns or events noted overnight.  Refusing to make many decisions.  Objective: Vitals:   03/10/20 2301 03/11/20 0444 03/11/20 0745 03/11/20 1500  BP: (!) 140/92 (!) 158/98  (!) 156/80  Pulse: 98 84  85  Resp: 16 16  20   Temp: (!) 97.5 F (36.4 C) 99.1 F (37.3 C)  (!) 97.4 F (36.3 C)  TempSrc: Oral Oral  Oral  SpO2: 95% 93% 96% 100%  Weight:      Height:        Intake/Output Summary (Last 24 hours) at 03/11/2020 1511 Last data filed at 03/11/2020 1002 Gross per 24 hour  Intake 170 ml  Output --  Net 170 ml   Filed Weights   03/08/20 1139 03/08/20 1815  Weight: 73.5 kg 74.3 kg    Examination:  General exam: Appears calm and comfortable  Respiratory system: Clear to auscultation. Respiratory effort normal. Cardiovascular system: S1 & S2 heard, RRR. No JVD, murmurs, rubs, gallops or clicks. No pedal edema. Gastrointestinal system: Abdomen is nondistended, soft and nontender. No organomegaly or masses felt. Normal bowel sounds heard. Central nervous system: Alert and awake Extremities: Symmetric 5 x 5 power. Skin: No rashes, lesions or ulcers Psychiatry: Flat affect    Data Reviewed: I have personally reviewed following labs and imaging studies  CBC: Recent Labs  Lab 03/06/20 2137 03/08/20 1356  WBC 6.6 7.1  NEUTROABS  --  5.3  HGB 11.0* 10.2*  HCT 34.6* 31.6*  MCV 96.6 96.0  PLT 304 656   Basic Metabolic Panel: Recent Labs  Lab 03/06/20 2137 03/08/20 1356 03/11/20 0628  NA 127* 131* 133*  K 4.6 4.5 2.9*  CL 91* 91* 99  CO2 22 26 23   GLUCOSE 92 88 105*  BUN 11 11 6*  CREATININE 0.65 0.67 0.57  CALCIUM 8.6* 9.0 8.0*  MG  --  2.1  --    GFR: Estimated Creatinine Clearance: 65.5 mL/min (by C-G formula based on SCr of 0.57 mg/dL). Liver  Function Tests: Recent Labs  Lab 03/06/20 2137 03/08/20 1356  AST 55* 51*  ALT 17 15  ALKPHOS 126 136*  BILITOT 1.8* 1.3*  PROT 7.5 7.5  ALBUMIN 3.4* 3.4*   Recent Labs  Lab 03/06/20 2137  LIPASE 36   No results for input(s): AMMONIA in the last 168 hours. Coagulation Profile: No results for input(s): INR, PROTIME in the last 168 hours. Cardiac Enzymes: No results for input(s): CKTOTAL, CKMB, CKMBINDEX, TROPONINI in the last 168 hours. BNP (last 3 results) No results for input(s): PROBNP in the last 8760 hours. HbA1C: No results for input(s): HGBA1C in the last 72 hours. CBG: No results for input(s): GLUCAP in the last 168 hours. Lipid Profile: No results for input(s): CHOL, HDL, LDLCALC, TRIG, CHOLHDL, LDLDIRECT in the last 72 hours. Thyroid Function Tests: Recent Labs    03/10/20 1252 03/10/20 1253  TSH 41.784*  --   FREET4  --  0.46*   Anemia Panel: No results for input(s): VITAMINB12, FOLATE, FERRITIN, TIBC, IRON, RETICCTPCT in the last 72 hours. Sepsis Labs: No results for input(s): PROCALCITON, LATICACIDVEN in the last 168 hours.  Recent Results (from the past 240 hour(s))  SARS CORONAVIRUS 2 (TAT 6-24 HRS) Nasopharyngeal Nasopharyngeal Swab     Status: None   Collection Time: 03/03/20  7:12 AM   Specimen:  Nasopharyngeal Swab  Result Value Ref Range Status   SARS Coronavirus 2 NEGATIVE NEGATIVE Final    Comment: (NOTE) SARS-CoV-2 target nucleic acids are NOT DETECTED. The SARS-CoV-2 RNA is generally detectable in upper and lower respiratory specimens during the acute phase of infection. Negative results do not preclude SARS-CoV-2 infection, do not rule out co-infections with other pathogens, and should not be used as the sole basis for treatment or other patient management decisions. Negative results must be combined with clinical observations, patient history, and epidemiological information. The expected result is Negative. Fact Sheet for  Patients: SugarRoll.be Fact Sheet for Healthcare Providers: https://www.woods-mathews.com/ This test is not yet approved or cleared by the [place] FDA and  has been authorized for detection and/or diagnosis of SARS-CoV-2 by FDA under an Emergency Use Authorization (EUA). This EUA will remain  in effect (meaning this test can be used) for the duration of the COVID-19 declaration under Section 56 4(b)(1) of the Act, 21 U.S.C. section 360bbb-3(b)(1), unless the authorization is terminated or revoked sooner. Performed at [facility], [place] [address] [place], [place] [address]   SARS CORONAVIRUS 2 (TAT 6-24 HRS) Nasopharyngeal Urine, Clean Catch     Status: None   Collection Time: 03/08/20  2:14 PM   Specimen: Urine, Clean Catch; Nasopharyngeal  Result Value Ref Range Status   SARS Coronavirus 2 NEGATIVE NEGATIVE Final    Comment: (NOTE) SARS-CoV-2 target nucleic acids are NOT DETECTED. The SARS-CoV-2 RNA is generally detectable in upper and lower respiratory specimens during the acute phase of infection. Negative results do not preclude SARS-CoV-2 infection, do not rule out co-infections with other pathogens, and should not be used as the sole basis for treatment or other patient management decisions. Negative results must be combined with clinical observations, patient history, and epidemiological information. The expected result is Negative. Fact Sheet for Patients: SugarRoll.be Fact Sheet for Healthcare Providers: https://www.woods-mathews.com/ This test is not yet approved or cleared by the [place] FDA and  has been authorized for detection and/or diagnosis of SARS-CoV-2 by FDA under an Emergency Use Authorization (EUA). This EUA will remain  in effect (meaning this test can be used) for the duration of the COVID-19 declaration under Section 56 4(b)(1) of the Act, 21 U.S.C. section  360bbb-3(b)(1), unless the authorization is terminated or revoked sooner. Performed at [facility], [place] [address] [place], [place] [address]          Radiology Studies: No results found.      Scheduled Meds: . enoxaparin (LOVENOX) injection  40 mg Subcutaneous Q24H  . feeding supplement  1 Container Oral TID BM  . feeding supplement (KATE FARMS STANDARD 1.4)  325 mL Oral Q24H  . levothyroxine  50 mcg Oral QAC breakfast  . magnesium citrate  1 Bottle Oral Once  . naloxegol oxalate  25 mg Oral Daily  . pantoprazole (PROTONIX) IV  40 mg Intravenous Q24H  . polyethylene glycol  17 g Oral BID  . senna-docusate  1 tablet Oral BID   Continuous Infusions: . dextrose 5 % and 0.9% NaCl 75 mL/hr at 03/11/20 1002     LOS: 2 days    Time spent: 30 minutes    Lindsey Mullins Darleen Crocker, DO [facility]  If 7PM-7AM, please contact night-coverage [email or web address] 03/11/2020, 3:11 PM

## 2020-03-12 MED ORDER — HYDROMORPHONE HCL 1 MG/ML IJ SOLN
0.2500 mg | INTRAMUSCULAR | Status: DC | PRN
  Administered 2020-03-12 – 2020-03-16 (×21): 0.25 mg via INTRAVENOUS
  Filled 2020-03-12 (×3): qty 1
  Filled 2020-03-12: qty 0.5
  Filled 2020-03-12 (×3): qty 1
  Filled 2020-03-12: qty 0.5
  Filled 2020-03-12: qty 1
  Filled 2020-03-12 (×2): qty 0.5
  Filled 2020-03-12: qty 1
  Filled 2020-03-12 (×2): qty 0.5
  Filled 2020-03-12 (×2): qty 1
  Filled 2020-03-12: qty 0.5
  Filled 2020-03-12 (×3): qty 1
  Filled 2020-03-12: qty 0.5

## 2020-03-12 MED ORDER — POTASSIUM CHLORIDE 20 MEQ/15ML (10%) PO SOLN
40.0000 meq | Freq: Two times a day (BID) | ORAL | Status: AC
  Administered 2020-03-12 (×2): 40 meq via ORAL
  Filled 2020-03-12 (×2): qty 30

## 2020-03-12 MED ORDER — ONDANSETRON HCL 4 MG/2ML IJ SOLN
4.0000 mg | Freq: Three times a day (TID) | INTRAMUSCULAR | Status: DC | PRN
  Administered 2020-03-12 – 2020-03-16 (×2): 4 mg via INTRAVENOUS
  Filled 2020-03-12 (×2): qty 2

## 2020-03-12 NOTE — Progress Notes (Signed)
PROGRESS NOTE    Lindsey Mullins  DDU:[id number] DOB: August 30, 1946 DOA: 03/08/2020 PCP: [name], MD   Brief Narrative:  Per HPI: Lindsey Mullins a 74 y.o.femalewith medical history significant formetastatic lung cancer,hypertension hypothyroidism. Patient was sent to the ED from the cancer center with reports of generalized weakness, abdominal and back pain and constipation. Patient was in the ED 2 days ago 4/18 for similar problems, abdominal CT was without evidence of perforation or bowel obstruction, but showed increase in size of hepatic metastatic disease,showed moderate stool burden in ascending and proximal transverse colon. She was discharged home with a bowel regimen to include magnesium citrate.  Patient also called her oncologist, and was prescribed Movantik. Patient last bowel movement was this morning, which was a small amount. Daughter at bedside who helps with the history and who takes care of patientsudden decline, patient only able to take very thin liquids, plain Ensure.Unable to walk without assistance. Generally weak. Minimal exertion results in difficulty breathing and wheezing. Any minimal movement aggravates the pain in her back. Daughter,Lindsey Mullins at bedside is the only caregiver for now.  4/21:Patient has been admitted with failure to thrive in the setting of metastatic lung cancer and is also noted to have some hyponatremia. She has had EGD on 4/16 which has demonstrated severe extrinsic stenosis of the esophagus. Palliative consult appreciated to determine goals of care. PT recommending SNF. Questionable whether family and patient would desire feeding tube at this point.  4/22:Patient continues to remain on thin liquid diet and has noted weakness. Pain appears controlled and she seems more lucid today. Family members and patient quite unsure of where they would like for treatment to head at this point. Palliative care to have further  discussions with family today.  4/23: Patient seen at the bedside this morning and continues to remain quite lethargic.  She appears to be tolerating her clear liquid diet.  SLP reevaluation declined by family members.  Plans for transfer for palliative esophageal stent placement on 4/26.  4/24: Patient remains quite lethargic this morning.  She was noted to have some hypokalemia yesterday and potassium supplementation has been ordered with repeat labs pending for a.m.  Plan for transfer to [facility] tomorrow for esophageal stent placement to take place 4/26.  Patient would like to discuss this more with family members to arrive later today.  Assessment & Plan:   Principal Problem:   Failure to thrive (0-17) Active Problems:   Squamous cell carcinoma of lung, stage IV, right (HCC)   Malignant neoplasm of right lung (HCC)   Failure to thrive in adult   Palliative care by specialist   Left low back pain   Failure to thrive in the setting of metastatic lung cancer -Appreciate palliative care consultation-ongoing -Appreciate oncology evaluation -Continue nutritional supplementation per dietitian with boost breeze 3 times daily -SLP evaluation recommending thin liquids -PT recommending SNF, but patient unsure of SNF at this point  Dysphagia -SLP evaluationwith thin liquids recommended for now -Recent EGD 4/16 with extrinsic compression of the esophagus noted -Further discussion with GI regarding palliative stent pending  Hyponatremia -Likely secondary to poor intake as well as lung cancer with SIADH -Appears to be improving right now with normal saline, continue to follow -Order further studiespending for a.m.  Hypokalemia -Replete orally and recheck in a.m.  Constipation -Continue Movantik -Enemas as needed -Senokot twice daily  Hypothyroidism-uncontrolled -Recent TSH 77.829 on 3/21 -Recheck TSH 41.74 and free T4 0.46 -Levothyroxine dose increased to  75 mcg on 4/23   Hypertension -IV labetalol as needed, but currently controlled  DVT prophylaxis:Lovenox Code Status:Full code Family Communication:Discussed with daughter at bedside on 4/22 Disposition Plan:Patient agreeable to SNF/rehab if needed.  Anticipating esophageal stent placement by 4/26.  Supplement potassium and recheck labs in a.m.   Consultants:  Palliative care  Oncology  Procedures:  None  Antimicrobials:   None  Subjective: Patient seen and evaluated today with no new acute complaints or concerns. No acute concerns or events noted overnight.  Objective: Vitals:   03/11/20 0745 03/11/20 1500 03/11/20 2130 03/12/20 0458  BP:  (!) 156/80 (!) 154/95 (!) 161/85  Pulse:  85 87 88  Resp:  20 16 16   Temp:  (!) 97.4 F (36.3 C) 98.4 F (36.9 C) 98.2 F (36.8 C)  TempSrc:  Oral Oral Oral  SpO2: 96% 100% 95% 97%  Weight:      Height:        Intake/Output Summary (Last 24 hours) at 03/12/2020 1208 Last data filed at 03/12/2020 0300 Gross per 24 hour  Intake 3373.31 ml  Output -  Net 3373.31 ml   Filed Weights   03/08/20 1139 03/08/20 1815  Weight: 73.5 kg 74.3 kg    Examination:  General exam: Appears calm and comfortable  Respiratory system: Clear to auscultation. Respiratory effort normal. Cardiovascular system: S1 & S2 heard, RRR. No JVD, murmurs, rubs, gallops or clicks. No pedal edema. Gastrointestinal system: Abdomen is nondistended, soft and nontender. No organomegaly or masses felt. Normal bowel sounds heard. Central nervous system: Alert and oriented. No focal neurological deficits. Extremities: Symmetric 5 x 5 power. Skin: No rashes, lesions or ulcers Psychiatry: Judgement and insight appear normal. Mood & affect appropriate.     Data Reviewed: I have personally reviewed following labs and imaging studies  CBC: Recent Labs  Lab 03/06/20 2137 03/08/20 1356  WBC 6.6 7.1  NEUTROABS  --  5.3  HGB 11.0* 10.2*  HCT 34.6* 31.6*  MCV  96.6 96.0  PLT 304 073   Basic Metabolic Panel: Recent Labs  Lab 03/06/20 2137 03/08/20 1356 03/11/20 0628  NA 127* 131* 133*  K 4.6 4.5 2.9*  CL 91* 91* 99  CO2 22 26 23   GLUCOSE 92 88 105*  BUN 11 11 6*  CREATININE 0.65 0.67 0.57  CALCIUM 8.6* 9.0 8.0*  MG  --  2.1  --    GFR: Estimated Creatinine Clearance: 65.5 mL/min (by C-G formula based on SCr of 0.57 mg/dL). Liver Function Tests: Recent Labs  Lab 03/06/20 2137 03/08/20 1356  AST 55* 51*  ALT 17 15  ALKPHOS 126 136*  BILITOT 1.8* 1.3*  PROT 7.5 7.5  ALBUMIN 3.4* 3.4*   Recent Labs  Lab 03/06/20 2137  LIPASE 36   No results for input(s): AMMONIA in the last 168 hours. Coagulation Profile: No results for input(s): INR, PROTIME in the last 168 hours. Cardiac Enzymes: No results for input(s): CKTOTAL, CKMB, CKMBINDEX, TROPONINI in the last 168 hours. BNP (last 3 results) No results for input(s): PROBNP in the last 8760 hours. HbA1C: No results for input(s): HGBA1C in the last 72 hours. CBG: No results for input(s): GLUCAP in the last 168 hours. Lipid Profile: No results for input(s): CHOL, HDL, LDLCALC, TRIG, CHOLHDL, LDLDIRECT in the last 72 hours. Thyroid Function Tests: Recent Labs    03/10/20 1252 03/10/20 1253  TSH 41.784*  --   FREET4  --  0.46*   Anemia Panel: No results for input(s):  VITAMINB12, FOLATE, FERRITIN, TIBC, IRON, RETICCTPCT in the last 72 hours. Sepsis Labs: No results for input(s): PROCALCITON, LATICACIDVEN in the last 168 hours.  Recent Results (from the past 240 hour(s))  SARS CORONAVIRUS 2 (TAT 6-24 HRS) Nasopharyngeal Nasopharyngeal Swab     Status: None   Collection Time: 03/03/20  7:12 AM   Specimen: Nasopharyngeal Swab  Result Value Ref Range Status   SARS Coronavirus 2 NEGATIVE NEGATIVE Final    Comment: (NOTE) SARS-CoV-2 target nucleic acids are NOT DETECTED. The SARS-CoV-2 RNA is generally detectable in upper and lower respiratory specimens during the acute  phase of infection. Negative results do not preclude SARS-CoV-2 infection, do not rule out co-infections with other pathogens, and should not be used as the sole basis for treatment or other patient management decisions. Negative results must be combined with clinical observations, patient history, and epidemiological information. The expected result is Negative. Fact Sheet for Patients: SugarRoll.be Fact Sheet for Healthcare Providers: https://www.woods-mathews.com/ This test is not yet approved or cleared by the [place] FDA and  has been authorized for detection and/or diagnosis of SARS-CoV-2 by FDA under an Emergency Use Authorization (EUA). This EUA will remain  in effect (meaning this test can be used) for the duration of the COVID-19 declaration under Section 56 4(b)(1) of the Act, 21 U.S.C. section 360bbb-3(b)(1), unless the authorization is terminated or revoked sooner. Performed at [facility], [place] [address], [place], [place] [address]   SARS CORONAVIRUS 2 (TAT 6-24 HRS) Nasopharyngeal Urine, Clean Catch     Status: None   Collection Time: 03/08/20  2:14 PM   Specimen: Urine, Clean Catch; Nasopharyngeal  Result Value Ref Range Status   SARS Coronavirus 2 NEGATIVE NEGATIVE Final    Comment: (NOTE) SARS-CoV-2 target nucleic acids are NOT DETECTED. The SARS-CoV-2 RNA is generally detectable in upper and lower respiratory specimens during the acute phase of infection. Negative results do not preclude SARS-CoV-2 infection, do not rule out co-infections with other pathogens, and should not be used as the sole basis for treatment or other patient management decisions. Negative results must be combined with clinical observations, patient history, and epidemiological information. The expected result is Negative. Fact Sheet for Patients: SugarRoll.be Fact Sheet for Healthcare Providers:  https://www.woods-mathews.com/ This test is not yet approved or cleared by the [place] FDA and  has been authorized for detection and/or diagnosis of SARS-CoV-2 by FDA under an Emergency Use Authorization (EUA). This EUA will remain  in effect (meaning this test can be used) for the duration of the COVID-19 declaration under Section 56 4(b)(1) of the Act, 21 U.S.C. section 360bbb-3(b)(1), unless the authorization is terminated or revoked sooner. Performed at [facility], [place] [address] [place], [place] [address]          Radiology Studies: No results found.      Scheduled Meds: . enoxaparin (LOVENOX) injection  40 mg Subcutaneous Q24H  . feeding supplement  1 Container Oral TID BM  . feeding supplement (KATE FARMS STANDARD 1.4)  325 mL Oral Q24H  . levothyroxine  75 mcg Oral QAC breakfast  . magnesium citrate  1 Bottle Oral Once  . naloxegol oxalate  25 mg Oral Daily  . pantoprazole (PROTONIX) IV  40 mg Intravenous Q24H  . polyethylene glycol  17 g Oral BID  . potassium chloride  40 mEq Oral BID  . senna-docusate  1 tablet Oral BID   Continuous Infusions: . dextrose 5 % and 0.9% NaCl 75  mL/hr at 03/11/20 1002     LOS: 3 days    Time spent: 30 minutes    Heena Woodbury Darleen Crocker, DO [facility]  If 7PM-7AM, please contact night-coverage [email or web address] 03/12/2020, 12:08 PM

## 2020-03-12 NOTE — Progress Notes (Signed)
MD notified of pain 8/10. Not time for q6 prn pain medication (see mar)

## 2020-03-13 LAB — BASIC METABOLIC PANEL
Anion gap: 9 (ref 5–15)
BUN: <5 mg/dL — ABNORMAL LOW (ref 8–23)
CO2: 23 mmol/L (ref 22–32)
Calcium: 8.3 mg/dL — ABNORMAL LOW (ref 8.9–10.3)
Chloride: 102 mmol/L (ref 98–111)
Creatinine, Ser: 0.49 mg/dL (ref 0.44–1.00)
GFR calc Af Amer: >60 mL/min (ref >60)
GFR calc non Af Amer: >60 mL/min (ref >60)
Glucose, Bld: 100 mg/dL — ABNORMAL HIGH (ref 70–99)
Potassium: 3.7 mmol/L (ref 3.5–5.1)
Sodium: 134 mmol/L — ABNORMAL LOW (ref 135–145)

## 2020-03-13 LAB — MAGNESIUM: Magnesium: 1.7 mg/dL (ref 1.7–2.4)

## 2020-03-13 NOTE — Progress Notes (Signed)
Brief GI progress note  I was asked to evaluate the patient's chart and imaging on 4/23 afternoon. Speaking with Dr. Laural Golden and Dr. Delton Coombes they discussed and described the patient's clinical history. I offered for a consideration of potential palliative stenting of the esophagus. I discussed with them that my schedule was full for next week but I would be happy to entertain potentially trying to add her on at some point early this week. The patient was to be transferred on Sunday so that the inpatient [facility] service could evaluate them over the weekend and subsequently then have opportunity to discuss with the patient and family further whether an esophageal stent would be offered versus a palliative G-tube. The patient seems to be on the inpatient GI list at this time. We do not have any dedicated time for stenting on 4/26 at this time but we will see if that is possible based on my current schedule but it may not be possible and then we would be looking at Tuesday or Wednesday for first availability. The inpatient GI service at [facility] will evaluate the patient and subsequently discussed with me further on 4/26. I will then discussed with the patient and family whether we consider proceeding with a potential esophageal stent. Thus the patient can remain n.p.o. at midnight for potential procedure but there is no guaranteed slot based on my availability but we will try to work her in early this week as possible if the decision is to consider permanent stent placement. Inpatient [facility] service will evaluate the patient on 4/26.   Justice Britain, MD [facility] Office # [phone]

## 2020-03-13 NOTE — Progress Notes (Signed)
PROGRESS NOTE    Lindsey Mullins  NLZ:[id number] DOB: 06/30/1946 DOA: 03/08/2020 PCP: [name], MD   Brief Narrative:  Per HPI: Lindsey Librizzi Perkinsis a 74 y.o.femalewith medical history significant formetastatic lung cancer,hypertension hypothyroidism. Patient was sent to the ED from the cancer center with reports of generalized weakness, abdominal and back pain and constipation. Patient was in the ED 2 days ago 4/18 for similar problems, abdominal CT was without evidence of perforation or bowel obstruction, but showed increase in size of hepatic metastatic disease,showed moderate stool burden in ascending and proximal transverse colon. She was discharged home with a bowel regimen to include magnesium citrate.  Patient also called her oncologist, and was prescribed Movantik. Patient last bowel movement was this morning, which was a small amount. Daughter at bedside who helps with the history and who takes care of patientsudden decline, patient only able to take very thin liquids, plain Ensure.Unable to walk without assistance. Generally weak. Minimal exertion results in difficulty breathing and wheezing. Any minimal movement aggravates the pain in her back. Daughter,[name] at bedside is the only caregiver for now.  4/21:Patient has been admitted with failure to thrive in the setting of metastatic lung cancer and is also noted to have some hyponatremia. She has had EGD on 4/16 which has demonstrated severe extrinsic stenosis of the esophagus. Palliative consult appreciated to determine goals of care. PT recommending SNF. Questionable whether family and patient would desire feeding tube at this point.  4/22:Patient continues to remain on thin liquid diet and has noted weakness. Pain appears controlled and she seems more lucid today. Family members and patient quite unsure of where they would like for treatment to head at this point. Palliative care to have further  discussions with family today.  4/23:Patient seen at the bedside this morning and continues to remain quite lethargic. She appears to be tolerating her clear liquid diet. SLP reevaluation declined by family members. Plans for transfer for palliative esophageal stent placement on 4/26.  4/24: Patient remains quite lethargic this morning.  She was noted to have some hypokalemia yesterday and potassium supplementation has been ordered with repeat labs pending for a.m.  Plan for transfer to [facility] tomorrow for esophageal stent placement to take place 4/26.  Patient would like to discuss this more with family members to arrive later today.  4/25: Patient is more awake and alert this morning and potassium levels have corrected after supplementation.  Plan to recheck a.m. labs.  Keep n.p.o. after midnight for planned palliative esophageal stent placement by a.m.  Patient agreeable to SNF/rehab placement upon discharge.  Assessment & Plan:   Principal Problem:   Failure to thrive (0-17) Active Problems:   Squamous cell carcinoma of lung, stage IV, right (HCC)   Malignant neoplasm of right lung (HCC)   Failure to thrive in adult   Palliative care by specialist   Left low back pain   Failure to thrive in the setting of metastatic lung cancer -Appreciate palliative care consultation-ongoing -Appreciate oncology evaluation -Continue nutritional supplementation per dietitian with boost breeze 3 times daily -SLP evaluationrecommendingthin liquids -PT recommending SNF, and patient is now agreeable  Dysphagia -SLP evaluationwith thin liquids recommended for now -Recent EGD 4/16 with extrinsic compression of the esophagus noted -Transfer to [facility] for palliative esophageal stent placement by Dr. Rush Landmark anticipated 4/26.  N.p.o. after midnight.  Hyponatremia-improving -Likely secondary to poor intake as well as lung cancer with SIADH -Appears to be improving right now with  normal saline, continue to follow -Order further studiespending for a.m.  Hypokalemia-resolved -Recheck in a.m.  Constipation -Continue Movantik -Enemas as needed -Senokot twice daily  Hypothyroidism-uncontrolled -Recent TSH 77.829 on 3/21 -Recheck TSH41.74 and free T4 0.46 -Levothyroxine dose increased to 75 mcg on 4/23  Hypertension -IV labetalol as needed, but currently controlled  DVT prophylaxis:Lovenox Code Status:Full code Family Communication:Discussed with daughter at bedside on 4/25 Disposition Plan:Patient and family agreeable to palliative esophageal stent placement which has been scheduled to be performed by Dr. Rush Landmark at [facility] on 4/26.  N.p.o. after midnight.  Anticipate discharge to SNF after.   Consultants:  Palliative care  Oncology  GI-Dr. Rush Landmark at [facility]  Procedures:  None  Antimicrobials:   None   Subjective: Patient seen and evaluated today with no new acute complaints or concerns. No acute concerns or events noted overnight.  Objective: Vitals:   03/11/20 2130 03/12/20 0458 03/12/20 2147 03/13/20 0556  BP: (!) 154/95 (!) 161/85 (!) 157/104 (!) 160/94  Pulse: 87 88 97 92  Resp: 16 16 16 16   Temp: 98.4 F (36.9 C) 98.2 F (36.8 C) 98.4 F (36.9 C) (!) 97.5 F (36.4 C)  TempSrc: Oral Oral Oral Oral  SpO2: 95% 97% 98% 95%  Weight:      Height:       No intake or output data in the 24 hours ending 03/13/20 1032 Filed Weights   03/08/20 1139 03/08/20 1815  Weight: 73.5 kg 74.3 kg    Examination:  General exam: Appears calm and comfortable  Respiratory system: Clear to auscultation. Respiratory effort normal. Cardiovascular system: S1 & S2 heard, RRR. No JVD, murmurs, rubs, gallops or clicks. No pedal edema. Gastrointestinal system: Abdomen is nondistended, soft and nontender. No organomegaly or masses felt. Normal bowel sounds heard. Central nervous system: Alert and oriented. No focal neurological  deficits. Extremities: Symmetric 5 x 5 power. Skin: No rashes, lesions or ulcers Psychiatry: Flat affect    Data Reviewed: I have personally reviewed following labs and imaging studies  CBC: Recent Labs  Lab 03/06/20 2137 03/08/20 1356  WBC 6.6 7.1  NEUTROABS  --  5.3  HGB 11.0* 10.2*  HCT 34.6* 31.6*  MCV 96.6 96.0  PLT 304 568   Basic Metabolic Panel: Recent Labs  Lab 03/06/20 2137 03/08/20 1356 03/11/20 0628 03/13/20 0606  NA 127* 131* 133* 134*  K 4.6 4.5 2.9* 3.7  CL 91* 91* 99 102  CO2 22 26 23 23   GLUCOSE 92 88 105* 100*  BUN 11 11 6* <5*  CREATININE 0.65 0.67 0.57 0.49  CALCIUM 8.6* 9.0 8.0* 8.3*  MG  --  2.1  --  1.7   GFR: Estimated Creatinine Clearance: 65.5 mL/min (by C-G formula based on SCr of 0.49 mg/dL). Liver Function Tests: Recent Labs  Lab 03/06/20 2137 03/08/20 1356  AST 55* 51*  ALT 17 15  ALKPHOS 126 136*  BILITOT 1.8* 1.3*  PROT 7.5 7.5  ALBUMIN 3.4* 3.4*   Recent Labs  Lab 03/06/20 2137  LIPASE 36   No results for input(s): AMMONIA in the last 168 hours. Coagulation Profile: No results for input(s): INR, PROTIME in the last 168 hours. Cardiac Enzymes: No results for input(s): CKTOTAL, CKMB, CKMBINDEX, TROPONINI in the last 168 hours. BNP (last 3 results) No results for input(s): PROBNP in the last 8760 hours. HbA1C: No results for input(s): HGBA1C in the last 72 hours. CBG: No results for input(s): GLUCAP in the last 168 hours. Lipid Profile:  No results for input(s): CHOL, HDL, LDLCALC, TRIG, CHOLHDL, LDLDIRECT in the last 72 hours. Thyroid Function Tests: Recent Labs    03/10/20 1252 03/10/20 1253  TSH 41.784*  --   FREET4  --  0.46*   Anemia Panel: No results for input(s): VITAMINB12, FOLATE, FERRITIN, TIBC, IRON, RETICCTPCT in the last 72 hours. Sepsis Labs: No results for input(s): PROCALCITON, LATICACIDVEN in the last 168 hours.  Recent Results (from the past 240 hour(s))  SARS CORONAVIRUS 2 (TAT 6-24  HRS) Nasopharyngeal Urine, Clean Catch     Status: None   Collection Time: 03/08/20  2:14 PM   Specimen: Urine, Clean Catch; Nasopharyngeal  Result Value Ref Range Status   SARS Coronavirus 2 NEGATIVE NEGATIVE Final    Comment: (NOTE) SARS-CoV-2 target nucleic acids are NOT DETECTED. The SARS-CoV-2 RNA is generally detectable in upper and lower respiratory specimens during the acute phase of infection. Negative results do not preclude SARS-CoV-2 infection, do not rule out co-infections with other pathogens, and should not be used as the sole basis for treatment or other patient management decisions. Negative results must be combined with clinical observations, patient history, and epidemiological information. The expected result is Negative. Fact Sheet for Patients: SugarRoll.be Fact Sheet for Healthcare Providers: https://www.woods-mathews.com/ This test is not yet approved or cleared by the [place] FDA and  has been authorized for detection and/or diagnosis of SARS-CoV-2 by FDA under an Emergency Use Authorization (EUA). This EUA will remain  in effect (meaning this test can be used) for the duration of the COVID-19 declaration under Section 56 4(b)(1) of the Act, 21 U.S.C. section 360bbb-3(b)(1), unless the authorization is terminated or revoked sooner. Performed at [facility], [place] [address] [place], [place] [address]          Radiology Studies: No results found.      Scheduled Meds: . enoxaparin (LOVENOX) injection  40 mg Subcutaneous Q24H  . feeding supplement  1 Container Oral TID BM  . feeding supplement (KATE FARMS STANDARD 1.4)  325 mL Oral Q24H  . levothyroxine  75 mcg Oral QAC breakfast  . magnesium citrate  1 Bottle Oral Once  . naloxegol oxalate  25 mg Oral Daily  . pantoprazole (PROTONIX) IV  40 mg Intravenous Q24H  . polyethylene glycol  17 g Oral BID  . senna-docusate  1 tablet Oral BID    Continuous Infusions: . dextrose 5 % and 0.9% NaCl 75 mL/hr at 03/13/20 0910     LOS: 4 days    Time spent: 35 minutes    Ismael Treptow Darleen Crocker, DO [facility]  If 7PM-7AM, please contact night-coverage [email or web address] 03/13/2020, 10:32 AM

## 2020-03-13 NOTE — H&P (View-Only) (Signed)
Brief GI progress note  I was asked to evaluate the patient's chart and imaging on 4/23 afternoon. Speaking with Dr. Laural Golden and Dr. Delton Coombes they discussed and described the patient's clinical history. I offered for a consideration of potential palliative stenting of the esophagus. I discussed with them that my schedule was full for next week but I would be happy to entertain potentially trying to add her on at some point early this week. The patient was to be transferred on Sunday so that the inpatient [place] [facility] service could evaluate them over the weekend and subsequently then have opportunity to discuss with the patient and family further whether an esophageal stent would be offered versus a palliative G-tube. The patient seems to be on the inpatient GI list at this time. We do not have any dedicated time for stenting on 4/26 at this time but we will see if that is possible based on my current schedule but it may not be possible and then we would be looking at Tuesday or Wednesday for first availability. The inpatient GI service at [facility] will evaluate the patient and subsequently discussed with me further on 4/26. I will then discussed with the patient and family whether we consider proceeding with a potential esophageal stent. Thus the patient can remain n.p.o. at midnight for potential procedure but there is no guaranteed slot based on my availability but we will try to work her in early this week as possible if the decision is to consider permanent stent placement. Inpatient [place] [facility] service will evaluate the patient on 4/26.   Justice Britain, MD [facility]oscopy Office # [phone]

## 2020-03-14 DIAGNOSIS — R933 Abnormal findings on diagnostic imaging of other parts of digestive tract: Secondary | ICD-10-CM

## 2020-03-14 DIAGNOSIS — Z515 Encounter for palliative care: Secondary | ICD-10-CM

## 2020-03-14 DIAGNOSIS — K222 Esophageal obstruction: Secondary | ICD-10-CM

## 2020-03-14 MED ORDER — CHLORHEXIDINE GLUCONATE CLOTH 2 % EX PADS
6.0000 | MEDICATED_PAD | Freq: Every day | CUTANEOUS | Status: DC
  Administered 2020-03-14 – 2020-03-18 (×3): 6 via TOPICAL

## 2020-03-14 MED ORDER — SODIUM CHLORIDE 0.9% FLUSH: 10.0000 mL | INTRAVENOUS | Status: DC | PRN

## 2020-03-14 MED ORDER — ENOXAPARIN SODIUM 40 MG/0.4ML ~~LOC~~ SOLN
40.0000 mg | SUBCUTANEOUS | Status: DC
  Administered 2020-03-15 – 2020-03-18 (×4): 40 mg via SUBCUTANEOUS
  Filled 2020-03-14 (×4): qty 0.4

## 2020-03-14 NOTE — Progress Notes (Signed)
Physical Therapy Treatment Patient Details Name: Lindsey Mullins MRN: [id number] DOB: 01/14/1946 Today's Date: 03/14/2020    History of Present Illness LISSETT FAVORITE is a 74 y.o. female with PMH significant for metastatic lung cancer, HTN, hypothyroidism who was sent to the ED from the cancer center with reports of generalized weakness, abdominal/back pain and constipation.  Pt was in the ED 4/18 for similar problems, abdominal CT- showed increase in size of hepatic metastatic disease, showed moderate stool burden in ascending and proximal transverse colon. Admitted with failure to thrive in the setting of metastatic lung cancer and hyponatremia. Plan for esophageal stent placement to take place the week of 4/26    PT Comments    Patient progressing slowly towards PT goals. Reports pain in left hip and back but unable to get pain meds due to no IV. Requires Mod-Max A for bed mobility, Mod A for standing and Min A for short distance ambulation with use of RW in room along bedside. Instructed pt in bracing using pillow for coughing. Encouraged mobility as much as tolerated. Will follow and progress as tolerated.   Follow Up Recommendations  SNF     Equipment Recommendations  Rolling walker with 5" wheels    Recommendations for Other Services       Precautions / Restrictions Precautions Precautions: Fall Restrictions Weight Bearing Restrictions: No    Mobility  Bed Mobility Overal bed mobility: Needs Assistance Bed Mobility: Rolling;Sidelying to Sit Rolling: Mod assist Sidelying to sit: Max assist;HOB elevated   Sit to supine: Max assist;+2 for physical assistance   General bed mobility comments: Cues for log roll technique, Mod A to roll and reach for rail and to elevate trunk to get to EOB; Assist to bring LEs into bed and lower trunk down.  Transfers Overall transfer level: Needs assistance Equipment used: Rolling walker (2 wheeled) Transfers: Sit to/from [employer] Sit to Stand: Mod assist Stand pivot transfers: Min assist       General transfer comment: Stood from EOB x1, from [facility] x1 with cues for hand placement; SPT bed to/from bSC with min A. Slow to mobilize with assist for RW.  Ambulation/Gait Ambulation/Gait assistance: Min assist Gait Distance (Feet): 6 Feet Assistive device: Rolling walker (2 wheeled) Gait Pattern/deviations: Decreased step length - right;Decreased step length - left;Decreased stride length;Trunk flexed Gait velocity: slow   General Gait Details: Able to take a few steps along side bed with Min A for support. Limited by pain and weakness/fatigue.   Stairs             Wheelchair Mobility    Modified Rankin (Stroke Patients Only)       Balance Overall balance assessment: Needs assistance Sitting-balance support: Feet supported;No upper extremity supported Sitting balance-Leahy Scale: Fair Sitting balance - Comments: Supervision for safety.   Standing balance support: During functional activity Standing balance-Leahy Scale: Poor Standing balance comment: Requires UE support and use of RW. ght                            Cognition Arousal/Alertness: Awake/alert Behavior During Therapy: WFL for tasks assessed/performed Overall Cognitive Status: Within Functional Limits for tasks assessed                                        Exercises      General  Comments General comments (skin integrity, edema, etc.): Daughter present during session.      Pertinent Vitals/Pain Pain Assessment: Faces Faces Pain Scale: Hurts even more Pain Location: left hip/back Pain Descriptors / Indicators: Sore;Aching;Discomfort;Grimacing Pain Intervention(s): Repositioned;Monitored during session(awaiting IV placement for pain meds)    Home Living                      Prior Function            PT Goals (current goals can now be found in the care plan section)  Progress towards PT goals: Progressing toward goals(slowly)    Frequency    Min 3X/week      PT Plan Current plan remains appropriate    Co-evaluation              AM-PAC PT "6 Clicks" Mobility   Outcome Measure  Help needed turning from your back to your side while in a flat bed without using bedrails?: A Lot Help needed moving from lying on your back to sitting on the side of a flat bed without using bedrails?: A Lot Help needed moving to and from a bed to a chair (including a wheelchair)?: A Lot Help needed standing up from a chair using your arms (e.g., wheelchair or bedside chair)?: A Lot Help needed to walk in hospital room?: A Little Help needed climbing 3-5 steps with a railing? : Total 6 Click Score: 12    End of Session   Activity Tolerance: Patient limited by fatigue;Patient limited by pain Patient left: in bed;with call bell/phone within reach;with bed alarm set;with family/visitor present Nurse Communication: Mobility status PT Visit Diagnosis: Unsteadiness on feet (R26.81);Other abnormalities of gait and mobility (R26.89);Muscle weakness (generalized) (M62.81);Pain Pain - Right/Left: Left Pain - part of body: Hip(back)     Time: 1104-1130 PT Time Calculation (min) (ACUTE ONLY): 26 min  Charges:  $Therapeutic Activity: 23-37 mins                     Marisa Severin, PT, DPT Acute Rehabilitation Services Pager [phone] Office [phone]       Marguarite Arbour A Sabra Heck 03/14/2020, 1:49 PM

## 2020-03-14 NOTE — Progress Notes (Signed)
Chaplain responded to Spiritual Consult for pray with Ms. Pevey.  Chaplain offered ministry of presence and asked how we could together offer a prayer to Melissa.  Mrs. Flurry requested intercessory prayer that the Reita Cliche would remove the cancer.  And so that's what we prayed for.  That we were standing in our faith in the light of hope, while knowing God had planned every day and hour of our life that our hope was he would remove this cancer from Ms. Khaimov.  We prayed for a healing.    Chaplain invited Ms. Petkus to call again if she needs spiritual support.  De Burrs Chaplain Resident

## 2020-03-14 NOTE — Progress Notes (Signed)
PROGRESS NOTE    LYNDEL DANCEL  ESP:[id number] DOB: [birth year] DOA: 03/08/2020 PCP: [name], MD   Brief Narrative:   Per HPI:  Olivine Hiers Perkinsis a 74 y.o.femalewith medical history significant formetastatic lung cancer,hypertension hypothyroidism was sent to the ED from the cancer center with reports of generalized weakness, abdominal and back pain and constipation. Patient was in the ED on  4/18 for similar problems. An abdominal CT was without evidence of perforation or bowel obstruction, but showed increase in size of hepatic metastatic disease,showed moderate stool burden in ascending and proximal transverse colon. She was discharged home with a bowel regimen to include magnesium citrate.  Patient also called her oncologist, and was prescribed Movantik. Patient's daughter also reported that patient had been feeling weaker than before with difficulty breathing on minimal exertion and pain on movement of her back.  Patient was then admitted to hospital for failure to thrive in the setting of metastatic lung cancer with hyponatremia. EGD on 4/16 which has demonstrated severe extrinsic stenosis of the esophagus. Palliative consult appreciated to determine goals of care. Patient was continued on liquid diet. SLP reevaluation declined by family members. Family members wished transfer for palliative esophageal stent placement on 4/26.  So the  patient was transferred from [facility] to [facility].  Patient agreeable to SNF/rehab placement upon discharge.  Assessment & Plan:   Principal Problem:   Failure to thrive (0-17) Active Problems:   Squamous cell carcinoma of lung, stage IV, right (HCC)   Malignant neoplasm of right lung (HCC)   Failure to thrive in adult   Palliative care by specialist   Left low back pain   Failure to thrive in the setting of metastatic lung cancer Waiting for palliative esophageal stent.  Nutrition on board.  Physical therapy has  seen the patient and recommend skilled nursing facility placement on discharge.  Dysphagia Patient was seen by speech therapy who recommended 10 limits.  Awaiting for palliative stent placement for extrinsic compression.  Hyponatremia Mild.  Received normal saline.  Check BMP in a.m.  Hypokalemia.  Improved with replacement.  Check BMP in a.m.  Constipation -Continue Movantik, Enemas as needed,Senokot twice daily  Hypothyroidism Recent TSH 77.829 on 3/21, Recheck TSH41.74 and free T4 0.46. Levothyroxine dose increased to 75 mcg on 4/23  Essential hypertension Currently on IV labetalol for now since patient is n.p.o.  DVT prophylaxis:Lovenox subq  Code Status:Full code  Family Communication:Spoke with the patient's her daughter at bedside.   Status is: Inpatient  Remains inpatient appropriate because:Plan for endoscopy evaluation and stent placement.   Dispo: The patient is from: SNF              Anticipated d/c is to: SNF              Anticipated d/c date is: 1 day              Patient currently is not medically stable to d/c.   Consultants:  Palliative care  Oncology  GI-Dr. Rush Landmark at [facility]  Procedures:  None  Antimicrobials:   None   Subjective: Today, patient was seen and examined at bedside.  Complains of difficulty swallowing.  Complains of mild body pain.  Objective: Vitals:   03/13/20 1322 03/13/20 2231 03/14/20 0157 03/14/20 0527  BP: (!) 152/89 (!) 128/96 136/90 (!) 154/85  Pulse: 90 92 91 84  Resp: 20  17 16   Temp: 97.6 F (36.4 C) 98.4 F (36.9 C) (!)  97.5 F (36.4 C) 97.6 F (36.4 C)  TempSrc: Oral Axillary Oral Oral  SpO2: 97% 94% 99% 94%  Weight:      Height:       No intake or output data in the 24 hours ending 03/14/20 1145 Filed Weights   03/08/20 1139 03/08/20 1815  Weight: 73.5 kg 74.3 kg   Body mass index is 26.44 kg/m.  Physical examination: General: Average built, not in obvious distress HENT:  Normocephalic, pupils equally reacting to light and accommodation.  No scleral pallor or icterus noted. Oral mucosa is moist.  Chest:  Clear breath sounds.  Diminished breath sounds bilaterally. No crackles or wheezes.  CVS: S1 &S2 heard. No murmur.  Regular rate and rhythm. Abdomen: Soft, nontender, nondistended.  Bowel sounds are heard.  Extremities: No cyanosis, clubbing or edema.  Peripheral pulses are palpable. Psych: Alert, awake communicative, flat affect. CNS:  No cranial nerve deficits.  Moves all extremities Skin: Warm and dry.  No rashes noted.   Data Reviewed: I have personally reviewed following labs and imaging studies  CBC: Recent Labs  Lab 03/08/20 1356  WBC 7.1  NEUTROABS 5.3  HGB 10.2*  HCT 31.6*  MCV 96.0  PLT 222   Basic Metabolic Panel: Recent Labs  Lab 03/08/20 1356 03/11/20 0628 03/13/20 0606  NA 131* 133* 134*  K 4.5 2.9* 3.7  CL 91* 99 102  CO2 26 23 23   GLUCOSE 88 105* 100*  BUN 11 6* <5*  CREATININE 0.67 0.57 0.49  CALCIUM 9.0 8.0* 8.3*  MG 2.1  --  1.7   GFR: Estimated Creatinine Clearance: 65.5 mL/min (by C-G formula based on SCr of 0.49 mg/dL). Liver Function Tests: Recent Labs  Lab 03/08/20 1356  AST 51*  ALT 15  ALKPHOS 136*  BILITOT 1.3*  PROT 7.5  ALBUMIN 3.4*   No results for input(s): LIPASE, AMYLASE in the last 168 hours. No results for input(s): AMMONIA in the last 168 hours. Coagulation Profile: No results for input(s): INR, PROTIME in the last 168 hours. Cardiac Enzymes: No results for input(s): CKTOTAL, CKMB, CKMBINDEX, TROPONINI in the last 168 hours. BNP (last 3 results) No results for input(s): PROBNP in the last 8760 hours. HbA1C: No results for input(s): HGBA1C in the last 72 hours. CBG: No results for input(s): GLUCAP in the last 168 hours. Lipid Profile: No results for input(s): CHOL, HDL, LDLCALC, TRIG, CHOLHDL, LDLDIRECT in the last 72 hours. Thyroid Function Tests: No results for input(s): TSH,  T4TOTAL, FREET4, T3FREE, THYROIDAB in the last 72 hours. Anemia Panel: No results for input(s): VITAMINB12, FOLATE, FERRITIN, TIBC, IRON, RETICCTPCT in the last 72 hours. Sepsis Labs: No results for input(s): PROCALCITON, LATICACIDVEN in the last 168 hours.  Recent Results (from the past 240 hour(s))  SARS CORONAVIRUS 2 (TAT 6-24 HRS) Nasopharyngeal Urine, Clean Catch     Status: None   Collection Time: 03/08/20  2:14 PM   Specimen: Urine, Clean Catch; Nasopharyngeal  Result Value Ref Range Status   SARS Coronavirus 2 NEGATIVE NEGATIVE Final    Comment: (NOTE) SARS-CoV-2 target nucleic acids are NOT DETECTED. The SARS-CoV-2 RNA is generally detectable in upper and lower respiratory specimens during the acute phase of infection. Negative results do not preclude SARS-CoV-2 infection, do not rule out co-infections with other pathogens, and should not be used as the sole basis for treatment or other patient management decisions. Negative results must be combined with clinical observations, patient history, and epidemiological information. The expected result is  Negative. Fact Sheet for Patients: SugarRoll.be Fact Sheet for Healthcare Providers: https://www.woods-mathews.com/ This test is not yet approved or cleared by the [place] FDA and  has been authorized for detection and/or diagnosis of SARS-CoV-2 by FDA under an Emergency Use Authorization (EUA). This EUA will remain  in effect (meaning this test can be used) for the duration of the COVID-19 declaration under Section 56 4(b)(1) of the Act, 21 U.S.C. section 360bbb-3(b)(1), unless the authorization is terminated or revoked sooner. Performed at [facility], [place] [address], [place], [place] [address]      Radiology Studies: No results found.   Scheduled Meds: . enoxaparin (LOVENOX) injection  40 mg Subcutaneous Q24H  . feeding supplement  1 Container Oral TID BM  .  feeding supplement (KATE FARMS STANDARD 1.4)  325 mL Oral Q24H  . levothyroxine  75 mcg Oral QAC breakfast  . magnesium citrate  1 Bottle Oral Once  . naloxegol oxalate  25 mg Oral Daily  . pantoprazole (PROTONIX) IV  40 mg Intravenous Q24H  . polyethylene glycol  17 g Oral BID  . senna-docusate  1 tablet Oral BID   Continuous Infusions: . dextrose 5 % and 0.9% NaCl 75 mL/hr at 03/13/20 0910     LOS: 5 days    Flora Lipps, MD [facility] 03/14/2020, 11:45 AM

## 2020-03-14 NOTE — Progress Notes (Signed)
Patient complaints of chest pain, RN went and assess patient who verbalizes that the pain is easy off, RN asked patient if she has GERD and patient replied yes that she takes Prilosec at home. Patient's daughter said patient takes Protonix at the hospital at night. RN gave pain medication and daily medication to patient and reassure patient and daughter that the Protonix will be given at night. Movantik not found in patient's bin, message to pharmacy, but did not receive it.

## 2020-03-14 NOTE — Consult Note (Addendum)
Consultation  Referring Provider:    Dr. Louanne Belton Primary Care Physician:  [name], MD Primary Gastroenterologist: [facility]       Reason for Consultation:   Placement of esophageal stent         HPI:   Lindsey Mullins is a 74 y.o. female with a past medical history significant for metastatic lung cancer, hypertension, hypothyroidism and others, who initially presented to the ED on 03/08/2020 with a complaint of weakness.  We are consulted today in regards to placement of an esophageal stent.    Today, patient's daughter is at her bedside who provides most of her history.  Tells me that she takes care of her solely and she has only been able to take very thin liquids, plain Ensure etc. and is generally weak with a lot of pain.  Patient's daughter is aware of options for G-tube versus esophageal stent and would like [place] to try esophageal stent first.  She is hopeful that patient will be able to eat and become strong enough for further treatment for her cancer.    Denies fever, chills or blood in her stool.  Past Medical History:  Diagnosis Date  . Atypical chest pain 05/23/2012   STRESS TEST - small to moderate sized area of partial reversibility of the anteroapical wall, most likely breast artifact, post-stress EF 67%, EKG show NSR at 65, No Lexiscan EKG changes, non-diagnostic for ischemia; STRESS TEST, 01/25/2010 - normal study, post-stress EF 66%, no significant ischemia  . Cancer ([place])   . Cerebral atherosclerosis 09/29/2012   CAROTID DUPLEX - RIGHT  BULB/PROXIMAL ICA-moderate amount of fibrous plaque, 50-69% diameter reduction; LEFT CEA-normal, no significant diameter reduction  . Colitis   . GERD (gastroesophageal reflux disease)   . Hyperlipidemia   . Hypertension   . Osteopenia   . Restless leg syndrome   . Shortness of breath 03/30/2005   2D ECHO - EF >55%, normal  . TIA (transient ischemic attack) 01/27/2010   2D ECHO - EF 65%, normal    Past Surgical History:    Procedure Laterality Date  . ABDOMINAL EXPLORATION SURGERY    . CAROTID ENDARTERECTOMY    . ESOPHAGOGASTRODUODENOSCOPY (EGD) WITH PROPOFOL N/A 03/04/2020   Procedure: ESOPHAGOGASTRODUODENOSCOPY (EGD) WITH PROPOFOL;  Surgeon: Rogene Houston, MD;  Location: [facility]ITE;  Service: Endoscopy;  Laterality: N/A;  210  . LYMPH NODE BIOPSY N/A 05/20/2019   Procedure: LYMPH NODE BIOPSY CERVICAL;  Surgeon: Aviva Signs, MD;  Location: [facility] ORS;  Service: General;  Laterality: N/A;  . Peripheral Vascular Catheterization  07/16/2005   Right verterbral-50% smooth segmental badsilar artery stenosis on right side, Right carotid-50% ulcerated proxmial stenosis, Left carotid-60 to 70% left externam carotid artery stenosis, 90% proximal left ICA stenosis  . PORTACATH PLACEMENT Left 06/12/2019   Procedure: INSERTION PORT-A-CATH (catheter attached left subclavian);  Surgeon: Aviva Signs, MD;  Location: [facility] ORS;  Service: General;  Laterality: Left;    Family History  Problem Relation Age of Onset  . Heart disease Mother   . Hypertension Mother   . Hypertension Father   . Heart disease Father     Social History   Tobacco Use  . Smoking status: Former Smoker    Packs/day: 0.25    Years: 10.00    Pack years: 2.50    Types: Cigarettes    Quit date: 04/21/1983    Years since quitting: 36.9  . Smokeless tobacco: Never Used  Substance Use Topics  .  Alcohol use: No  . Drug use: No    Prior to Admission medications   Medication Sig Start Date End Date Taking? Authorizing Provider  ciprofloxacin (CIPRO) 250 MG tablet Take 1 tablet (250 mg total) by mouth 2 (two) times daily. 03/01/20  Yes [place], Modena Nunnery, MD  HYDROmorphone (DILAUDID) 2 MG tablet Take 0.5 tablets (1 mg total) by mouth every 4 (four) hours as needed for severe pain. 03/03/20  Yes Derek Jack, MD  levothyroxine (SYNTHROID) 50 MCG tablet Take 50 mcg by mouth daily before breakfast.   Yes [provider]  naloxegol oxalate  (MOVANTIK) 25 MG TABS tablet Take 1 tablet (25 mg total) by mouth daily. 03/07/20  Yes Derek Jack, MD  nitroGLYCERIN (NITROSTAT) 0.4 MG SL tablet DISSOLVE ONE TABLET UNDER THE TONGUE AS NEEDED FOR CHEST PAIN**MAX 3 TABLETS EACH 5 MINUTES APART** 09/14/16  Yes [place], Modena Nunnery, MD  omeprazole (PRILOSEC) 20 MG capsule Take 1 po daily Patient taking differently: Take 20 mg by mouth daily.  12/31/18  Yes [place], Modena Nunnery, MD  losartan (COZAAR) 50 MG tablet Take 25 mg by mouth daily.    [provider]  rosuvastatin (CRESTOR) 20 MG tablet Take 20 mg by mouth daily.    [provider]    Current Facility-Administered Medications  Medication Dose Route Frequency Provider Last Rate Last Admin  . acetaminophen (TYLENOL) tablet 650 mg  650 mg Oral Q6H PRN Emokpae, Ejiroghene E, MD       Or  . acetaminophen (TYLENOL) suppository 650 mg  650 mg Rectal Q6H PRN Emokpae, Ejiroghene E, MD      . dextrose 5 %-0.9 % sodium chloride infusion   Intravenous Continuous Emokpae, Ejiroghene E, MD 75 mL/hr at 03/13/20 0910 New Bag at 03/13/20 0910  . enoxaparin (LOVENOX) injection 40 mg  40 mg Subcutaneous Q24H Mansouraty, Telford Nab., MD   40 mg at 03/12/20 1807  . feeding supplement (BOOST / RESOURCE BREEZE) liquid 1 Container  1 Container Oral TID BM Emokpae, Ejiroghene E, MD   1 Container at 03/12/20 1345  . feeding supplement (KATE FARMS STANDARD 1.4) liquid 325 mL  325 mL Oral Q24H Shah, Pratik D, DO   325 mL at 03/11/20 1455  . HYDROmorphone (DILAUDID) injection 0.25 mg  0.25 mg Intravenous Q3H PRN Lynetta Mare, MD   0.25 mg at 03/13/20 2140  . labetalol (NORMODYNE) injection 5 mg  5 mg Intravenous Q2H PRN Emokpae, Ejiroghene E, MD      . levothyroxine (SYNTHROID) tablet 75 mcg  75 mcg Oral QAC breakfast Heath Lark D, DO   75 mcg at 03/14/20 0615  . magnesium citrate solution 1 Bottle  1 Bottle Oral Once Emokpae, Ejiroghene E, MD      . naloxegol oxalate (MOVANTIK) tablet 25 mg   25 mg Oral Daily Emokpae, Ejiroghene E, MD   25 mg at 03/13/20 0907  . ondansetron (ZOFRAN) injection 4 mg  4 mg Intravenous Q8H PRN Lovey Newcomer T, NP   4 mg at 03/12/20 2205  . pantoprazole (PROTONIX) injection 40 mg  40 mg Intravenous Q24H Lang Snow, FNP   40 mg at 03/13/20 2117  . polyethylene glycol (MIRALAX / GLYCOLAX) packet 17 g  17 g Oral Daily PRN Emokpae, Ejiroghene E, MD   17 g at 03/08/20 2029  . polyethylene glycol (MIRALAX / GLYCOLAX) packet 17 g  17 g Oral BID Emokpae, Ejiroghene E, MD   17 g at 03/13/20 2117  . senna-docusate (  Senokot-S) tablet 1 tablet  1 tablet Oral BID Emokpae, Ejiroghene E, MD   1 tablet at 03/13/20 2117   Facility-Administered Medications Ordered in Other Encounters  Medication Dose Route Frequency Provider Last Rate Last Admin  . sodium chloride flush (NS) 0.9 % injection 10 mL  10 mL Intracatheter PRN Derek Jack, MD   10 mL at 10/07/19 1140    Allergies as of 03/08/2020 - Review Complete 03/08/2020  Allergen Reaction Noted  . Penicillins Shortness Of Breath and Other (See Comments) 04/01/2012  . Codeine Nausea And Vomiting 04/01/2012  . Lipitor [atorvastatin] Other (See Comments) 04/14/2013  . Lisinopril Cough 04/14/2013  . Pravastatin Other (See Comments) 04/14/2013     Review of Systems:    Constitutional: No fever or chills Skin: No rash Cardiovascular: No chest pain Respiratory: No SOB Gastrointestinal: See HPI and otherwise negative Genitourinary: No dysuria Neurological: No headache Musculoskeletal: No new muscle or joint pain Hematologic: No bleeding Psychiatric: No history of depression or anxiety    Physical Exam:  Vital signs in last 24 hours: Temp:  [97.5 F (36.4 C)-98.4 F (36.9 C)] 97.6 F (36.4 C) (04/26 0527) Pulse Rate:  [84-92] 84 (04/26 0527) Resp:  [16-20] 16 (04/26 0527) BP: (128-154)/(85-96) 154/85 (04/26 0527) SpO2:  [94 %-99 %] 94 % (04/26 0527) Last BM Date: 03/12/20 General:   Pleasant  elderly, weak appearing,  appears to be in NAD, Well developed, thin, alert and cooperative Head:  Normocephalic and atraumatic. Eyes:   PEERL, EOMI. No icterus. Conjunctiva pink. Ears:  Normal auditory acuity. Neck:  Supple Throat: Oral cavity and pharynx without inflammation, swelling or lesion. Lungs: Respirations even and unlabored. Lungs clear to auscultation bilaterally.   No wheezes, crackles, or rhonchi.  Heart: Normal S1, S2. No MRG. Regular rate and rhythm. No peripheral edema, cyanosis or pallor.  Abdomen:  Soft, nondistended, nontender. No rebound or guarding. Normal bowel sounds. No appreciable masses or hepatomegaly. Rectal:  Not performed.  Msk:  Symmetrical without gross deformities. Peripheral pulses intact.  Extremities:  Without edema, no deformity or joint abnormality.  Neurologic:  Alert and  oriented x4;  grossly normal neurologically.  Skin:   Dry and intact without significant lesions or rashes. Psychiatric: Demonstrates good judgement and reason without abnormal affect or behaviors.   LAB RESULTS: BMET Recent Labs    03/13/20 0606  NA 134*  K 3.7  CL 102  CO2 23  GLUCOSE 100*  BUN <5*  CREATININE 0.49  CALCIUM 8.3*    Impression / Plan:   Impression: 1.  Dysphagia: 03/04/20 EGD with extrinsic narrowing of the esophagus felt from lymph nodes and a 2 cm hiatal hernia, patient continues with dysphagia, plans are for esophageal stent placement 2.  Metastatic lung cancer  Plan: 1.  Will discuss case with Dr. Rush Landmark who has offered to place esophageal stent, will figure out timing for the patient. 2.  Please await further recommendations later today.  Thank you for your kind consultation, we will continue to follow.  Lavone Nian [facility]  03/14/2020, 10:25 AM

## 2020-03-15 ENCOUNTER — Inpatient Hospital Stay (HOSPITAL_COMMUNITY): Payer: Medicare PPO | Admitting: Anesthesiology

## 2020-03-15 ENCOUNTER — Encounter (HOSPITAL_COMMUNITY): Payer: Self-pay | Admitting: Internal Medicine

## 2020-03-15 ENCOUNTER — Inpatient Hospital Stay (HOSPITAL_COMMUNITY): Payer: Medicare PPO

## 2020-03-15 ENCOUNTER — Ambulatory Visit (HOSPITAL_COMMUNITY): Admission: RE | Admit: 2020-03-15 | Payer: Medicare PPO | Source: Ambulatory Visit

## 2020-03-15 ENCOUNTER — Encounter (HOSPITAL_COMMUNITY)
Admission: EM | Disposition: A | Discharge status: Home / Self Care | Payer: Self-pay | Source: Home / Self Care | Attending: Internal Medicine

## 2020-03-15 HISTORY — PX: ESOPHAGEAL STENT PLACEMENT: SHX5540

## 2020-03-15 HISTORY — PX: ESOPHAGOGASTRODUODENOSCOPY: SHX5428

## 2020-03-15 LAB — CBC
HCT: 27.9 % — ABNORMAL LOW (ref 36.0–46.0)
Hemoglobin: 8.8 g/dL — ABNORMAL LOW (ref 12.0–15.0)
MCH: 31 pg (ref 26.0–34.0)
MCHC: 31.5 g/dL (ref 30.0–36.0)
MCV: 98.2 fL (ref 80.0–100.0)
Platelets: 338 10*3/uL (ref 150–400)
RBC: 2.84 MIL/uL — ABNORMAL LOW (ref 3.87–5.11)
RDW: 17 % — ABNORMAL HIGH (ref 11.5–15.5)
WBC: 7.2 10*3/uL (ref 4.0–10.5)
nRBC: 0.3 % — ABNORMAL HIGH (ref 0.0–0.2)

## 2020-03-15 LAB — COMPREHENSIVE METABOLIC PANEL
ALT: 16 U/L (ref 0–44)
AST: 56 U/L — ABNORMAL HIGH (ref 15–41)
Albumin: 2.4 g/dL — ABNORMAL LOW (ref 3.5–5.0)
Alkaline Phosphatase: 127 U/L — ABNORMAL HIGH (ref 38–126)
Anion gap: 11 (ref 5–15)
BUN: <5 mg/dL — ABNORMAL LOW (ref 8–23)
CO2: 24 mmol/L (ref 22–32)
Calcium: 8.2 mg/dL — ABNORMAL LOW (ref 8.9–10.3)
Chloride: 100 mmol/L (ref 98–111)
Creatinine, Ser: 0.65 mg/dL (ref 0.44–1.00)
GFR calc Af Amer: >60 mL/min (ref >60)
GFR calc non Af Amer: >60 mL/min (ref >60)
Glucose, Bld: 115 mg/dL — ABNORMAL HIGH (ref 70–99)
Potassium: 3.4 mmol/L — ABNORMAL LOW (ref 3.5–5.1)
Sodium: 135 mmol/L (ref 135–145)
Total Bilirubin: 0.8 mg/dL (ref 0.3–1.2)
Total Protein: 6.1 g/dL — ABNORMAL LOW (ref 6.5–8.1)

## 2020-03-15 LAB — PHOSPHORUS: Phosphorus: 3.1 mg/dL (ref 2.5–4.6)

## 2020-03-15 LAB — MAGNESIUM: Magnesium: 1.7 mg/dL (ref 1.7–2.4)

## 2020-03-15 SURGERY — EGD (ESOPHAGOGASTRODUODENOSCOPY)
Anesthesia: Monitor Anesthesia Care

## 2020-03-15 MED ORDER — ALBUMIN HUMAN 5 % IV SOLN: INTRAVENOUS | Status: DC | PRN

## 2020-03-15 MED ORDER — PHENYLEPHRINE HCL (PRESSORS) 10 MG/ML IV SOLN
INTRAVENOUS | Status: DC | PRN
  Administered 2020-03-15 (×2): 40 ug via INTRAVENOUS
  Administered 2020-03-15 (×2): 120 ug via INTRAVENOUS
  Administered 2020-03-15: 80 ug via INTRAVENOUS

## 2020-03-15 MED ORDER — FENTANYL CITRATE (PF) 100 MCG/2ML IJ SOLN
INTRAMUSCULAR | Status: AC
  Filled 2020-03-15: qty 2

## 2020-03-15 MED ORDER — POTASSIUM CHLORIDE 10 MEQ/100ML IV SOLN
10.0000 meq | INTRAVENOUS | Status: AC
  Administered 2020-03-15 (×4): 10 meq via INTRAVENOUS
  Filled 2020-03-15 (×4): qty 100

## 2020-03-15 MED ORDER — PHENYLEPHRINE HCL-NACL 10-0.9 MG/250ML-% IV SOLN
INTRAVENOUS | Status: DC | PRN
  Administered 2020-03-15: 50 ug/min via INTRAVENOUS

## 2020-03-15 MED ORDER — SODIUM CHLORIDE 0.9 % IV SOLN: INTRAVENOUS | Status: DC

## 2020-03-15 MED ORDER — SUCCINYLCHOLINE 20MG/ML (10ML) SYRINGE FOR MEDFUSION PUMP - OPTIME
INTRAMUSCULAR | Status: DC | PRN
Start: 2020-03-15 — End: 2020-03-15
  Administered 2020-03-15: 120 mg via INTRAVENOUS

## 2020-03-15 MED ORDER — FENTANYL CITRATE (PF) 100 MCG/2ML IJ SOLN
12.5000 ug | Freq: Once | INTRAMUSCULAR | Status: AC
  Administered 2020-03-15: 12.5 ug via INTRAVENOUS

## 2020-03-15 MED ORDER — FENTANYL CITRATE (PF) 100 MCG/2ML IJ SOLN
12.5000 ug | Freq: Once | INTRAMUSCULAR | Status: AC
  Administered 2020-03-15: 12.5 ug via INTRAVENOUS
  Filled 2020-03-15: qty 2

## 2020-03-15 MED ORDER — PROPOFOL 10 MG/ML IV BOLUS
INTRAVENOUS | Status: DC | PRN
  Administered 2020-03-15: 90 mg via INTRAVENOUS

## 2020-03-15 MED ORDER — ALUM & MAG HYDROXIDE-SIMETH 200-200-20 MG/5ML PO SUSP
30.0000 mL | ORAL | Status: DC | PRN
  Administered 2020-03-16 – 2020-03-18 (×2): 30 mL via ORAL
  Filled 2020-03-15 (×4): qty 30

## 2020-03-15 MED ORDER — DEXAMETHASONE SODIUM PHOSPHATE 4 MG/ML IJ SOLN
INTRAMUSCULAR | Status: DC | PRN
  Administered 2020-03-15: 4 mg via INTRAVENOUS

## 2020-03-15 MED ORDER — ONDANSETRON HCL 4 MG/2ML IJ SOLN
INTRAMUSCULAR | Status: DC | PRN
  Administered 2020-03-15 (×2): 4 mg via INTRAVENOUS

## 2020-03-15 MED ORDER — LACTATED RINGERS IV SOLN: INTRAVENOUS | Status: DC | PRN

## 2020-03-15 MED ORDER — ONDANSETRON HCL 4 MG/2ML IJ SOLN
INTRAMUSCULAR | Status: AC
  Filled 2020-03-15: qty 2

## 2020-03-15 NOTE — Interval H&P Note (Signed)
History and Physical Interval Note:  03/15/2020 12:07 PM  Lindsey Mullins  has presented today for surgery, with the diagnosis of Esophageal Stenosis.  The various methods of treatment have been discussed with the patient and family. After consideration of risks, benefits and other options for treatment, the patient has consented to  Procedure(s): ESOPHAGOGASTRODUODENOSCOPY (EGD) (N/A) ESOPHAGEAL STENT PLACEMENT (N/A) as a surgical intervention.  The patient's history has been reviewed, patient examined, no change in status, stable for surgery.  I have reviewed the patient's chart and labs.  Questions were answered to the patient's satisfaction.     [employer]

## 2020-03-15 NOTE — Progress Notes (Addendum)
PROGRESS NOTE    Lindsey Mullins  YQI:[id number] DOB: 1946/05/26 DOA: 03/08/2020 PCP: [name], MD   Brief Narrative:   Per HPI:  Lindsey Sterbenz Perkinsis a 74 y.o.femalewith medical history significant formetastatic lung cancer,hypertension hypothyroidism was sent to the ED from the cancer center with reports of generalized weakness, abdominal and back pain and constipation. Patient was in the ED on  4/18 for similar problems. An abdominal CT was without evidence of perforation or bowel obstruction, but showed increase in size of hepatic metastatic disease,showed moderate stool burden in ascending and proximal transverse colon. She was discharged home with a bowel regimen to include magnesium citrate.  Patient also called her oncologist, and was prescribed Movantik. Patient's daughter also reported that patient had been feeling weaker than before with difficulty breathing on minimal exertion and pain on movement of her back.  Patient was then admitted to hospital for failure to thrive in the setting of metastatic lung cancer with hyponatremia. EGD on 4/16 which has demonstrated severe extrinsic stenosis of the esophagus. Palliative consult appreciated to determine goals of care. Patient was continued on liquid diet. SLP reevaluation declined by family members. Family members wished transfer for palliative esophageal stent placement on 4/26.  So the  patient was transferred from [facility] to [facility].  Patient agreeable to SNF/rehab placement upon discharge.  Assessment & Plan:   Principal Problem:   Failure to thrive (0-17) Active Problems:   Squamous cell carcinoma of lung, stage IV, right (HCC)   Malignant neoplasm of right lung (HCC)   Failure to thrive in adult   Palliative care by specialist   Left low back pain   Failure to thrive in the setting of metastatic lung cancer Status post esophageal stent today by GI.  Spoke with Dr. Rush Landmark.  Status post  procedure patient had esophageal pain.  X-ray has been ordered post procedure to ensure there is no perforation..  If X-rays negative, patient could try clear liquids today.  She might need IV analgesia.[name]  Physical therapy has seen the patient and recommend skilled nursing facility placement on discharge.  Continue IV fluids for now.  Followed by oncology as outpatient.  Mild hypokalemia.  Will replenish IV.  Check BMP in a.m.  Dysphagia secondary to extrinsic compression Status post palliative stent placement for esophageal stricture.    Hyponatremia Resolved.  Sodium of 135.  BMP in a.m.  Hypokalemia.  Improved with replacement.  Check BMP in a.m.  Constipation -Continue Movantik, Enemas as needed,Senokot twice daily  Hypothyroidism Recent TSH 77.829 on 3/21, Recheck TSH41.74 and free T4 0.46. Levothyroxine dose increased to 75 mcg on 4/23  Essential hypertension Currently on IV labetalol for now   DVT prophylaxis:Lovenox subq  Code Status:Full code  Family Communication:Spoke with the patient's  daughter at bedside today prior to endoscopic procedure.  Status is: Inpatient  Remains inpatient appropriate because: post stent placement, continued observation,  Dispo: The patient is from: SNF              Anticipated d/c is to: SNF              Anticipated d/c date is: 1 day if ok with GI, postprocedural pain and will need to follow-up with x-rays and advance diet as per GI              Patient currently is not medically stable to d/c.   Consultants:  Palliative care  Oncology  GI-Dr. Rush Landmark at [facility]  Procedures:  None  Antimicrobials:   None  Subjective: Today, patient was seen and examined at bedside prior to endoscopic procedure.  Complained of difficulty swallowing.  Objective: Vitals:   03/15/20 0506 03/15/20 1117 03/15/20 1339 03/15/20 1400  BP: 132/85 (!) 172/89 (!) 172/89 (!) 169/80  Pulse: 96     Resp: 18 17 18 19   Temp: 97.6 F  (36.4 C) (!) 97 F (36.1 C) 98.4 F (36.9 C)   TempSrc: Oral Oral Axillary   SpO2: 96%  100%   Weight:      Height:        Intake/Output Summary (Last 24 hours) at 03/15/2020 1443 Last data filed at 03/15/2020 1312 Gross per 24 hour  Intake 450 ml  Output --  Net 450 ml   Filed Weights   03/08/20 1139 03/08/20 1815  Weight: 73.5 kg 74.3 kg   Body mass index is 26.44 kg/m.  Physical examination:  General: Average built, not in obvious distress HENT: Normocephalic, pupils equally reacting to light and accommodation.  No scleral pallor or icterus noted. Oral mucosa is moist.  Chest:  Clear breath sounds.  Diminished breath sounds bilaterally. No crackles or wheezes.  CVS: S1 &S2 heard. No murmur.  Regular rate and rhythm. Abdomen: Soft, nontender, nondistended.  Bowel sounds are heard.  Extremities: No cyanosis, clubbing or edema.  Peripheral pulses are palpable. Psych: Alert, awake communicative, flat affect. CNS:  No cranial nerve deficits.  Moves all extremities Skin: Warm and dry.  No rashes noted.   Data Reviewed: I have personally reviewed following labs and imaging studies  CBC: Recent Labs  Lab 03/15/20 0448  WBC 7.2  HGB 8.8*  HCT 27.9*  MCV 98.2  PLT 096   Basic Metabolic Panel: Recent Labs  Lab 03/11/20 0628 03/13/20 0606 03/15/20 0448  NA 133* 134* 135  K 2.9* 3.7 3.4*  CL 99 102 100  CO2 23 23 24   GLUCOSE 105* 100* 115*  BUN 6* <5* <5*  CREATININE 0.57 0.49 0.65  CALCIUM 8.0* 8.3* 8.2*  MG  --  1.7 1.7  PHOS  --   --  3.1   GFR: Estimated Creatinine Clearance: 65.5 mL/min (by C-G formula based on SCr of 0.65 mg/dL). Liver Function Tests: Recent Labs  Lab 03/15/20 0448  AST 56*  ALT 16  ALKPHOS 127*  BILITOT 0.8  PROT 6.1*  ALBUMIN 2.4*   No results for input(s): LIPASE, AMYLASE in the last 168 hours. No results for input(s): AMMONIA in the last 168 hours. Coagulation Profile: No results for input(s): INR, PROTIME in the last  168 hours. Cardiac Enzymes: No results for input(s): CKTOTAL, CKMB, CKMBINDEX, TROPONINI in the last 168 hours. BNP (last 3 results) No results for input(s): PROBNP in the last 8760 hours. HbA1C: No results for input(s): HGBA1C in the last 72 hours. CBG: No results for input(s): GLUCAP in the last 168 hours. Lipid Profile: No results for input(s): CHOL, HDL, LDLCALC, TRIG, CHOLHDL, LDLDIRECT in the last 72 hours. Thyroid Function Tests: No results for input(s): TSH, T4TOTAL, FREET4, T3FREE, THYROIDAB in the last 72 hours. Anemia Panel: No results for input(s): VITAMINB12, FOLATE, FERRITIN, TIBC, IRON, RETICCTPCT in the last 72 hours. Sepsis Labs: No results for input(s): PROCALCITON, LATICACIDVEN in the last 168 hours.  Recent Results (from the past 240 hour(s))  SARS CORONAVIRUS 2 (TAT 6-24 HRS) Nasopharyngeal Urine, Clean Catch     Status: None   Collection Time: 03/08/20  2:14 PM   Specimen:  Urine, Clean Catch; Nasopharyngeal  Result Value Ref Range Status   SARS Coronavirus 2 NEGATIVE NEGATIVE Final    Comment: (NOTE) SARS-CoV-2 target nucleic acids are NOT DETECTED. The SARS-CoV-2 RNA is generally detectable in upper and lower respiratory specimens during the acute phase of infection. Negative results do not preclude SARS-CoV-2 infection, do not rule out co-infections with other pathogens, and should not be used as the sole basis for treatment or other patient management decisions. Negative results must be combined with clinical observations, patient history, and epidemiological information. The expected result is Negative. Fact Sheet for Patients: SugarRoll.be Fact Sheet for Healthcare Providers: https://www.woods-mathews.com/ This test is not yet approved or cleared by the [place] FDA and  has been authorized for detection and/or diagnosis of SARS-CoV-2 by FDA under an Emergency Use Authorization (EUA). This EUA will remain   in effect (meaning this test can be used) for the duration of the COVID-19 declaration under Section 56 4(b)(1) of the Act, 21 U.S.C. section 360bbb-3(b)(1), unless the authorization is terminated or revoked sooner. Performed at [facility], [place] [address], [place], [place] [address]      Radiology Studies: DG Esophagus Dilatation  Result Date: 03/15/2020 ESOPHAGEAL DILATATION: Fluoroscopy was provided for use by the requesting physician.  No images were obtained for radiographic interpretation.    Scheduled Meds: . [MAR Hold] Chlorhexidine Gluconate Cloth  6 each Topical Daily  . [MAR Hold] enoxaparin (LOVENOX) injection  40 mg Subcutaneous Q24H  . [MAR Hold] feeding supplement  1 Container Oral TID BM  . [MAR Hold] feeding supplement (KATE FARMS STANDARD 1.4)  325 mL Oral Q24H  . [MAR Hold] levothyroxine  75 mcg Oral QAC breakfast  . [MAR Hold] magnesium citrate  1 Bottle Oral Once  . [MAR Hold] naloxegol oxalate  25 mg Oral Daily  . [MAR Hold] pantoprazole (PROTONIX) IV  40 mg Intravenous Q24H  . [MAR Hold] polyethylene glycol  17 g Oral BID  . [MAR Hold] senna-docusate  1 tablet Oral BID   Continuous Infusions: . sodium chloride    . dextrose 5 % and 0.9% NaCl 75 mL/hr at 03/15/20 0518     LOS: 6 days    Flora Lipps, MD [facility] 03/15/2020, 2:43 PM

## 2020-03-15 NOTE — Anesthesia Procedure Notes (Signed)
Procedure Name: Intubation Date/Time: 03/15/2020 12:32 PM Performed by: Jenne Campus, CRNA Pre-anesthesia Checklist: Patient identified, Emergency Drugs available, Suction available and Patient being monitored Patient Re-evaluated:Patient Re-evaluated prior to induction Oxygen Delivery Method: Circle System Utilized Preoxygenation: Pre-oxygenation with 100% oxygen Induction Type: IV induction, Rapid sequence and Cricoid Pressure applied Ventilation: Mask ventilation without difficulty Laryngoscope Size: Miller and 2 Grade View: Grade I Tube type: Oral Tube size: 7.0 mm Number of attempts: 1 Airway Equipment and Method: Stylet and Oral airway Placement Confirmation: ETT inserted through vocal cords under direct vision,  positive ETCO2 and breath sounds checked- equal and bilateral Secured at: 21 cm Tube secured with: Tape Dental Injury: Teeth and Oropharynx as per pre-operative assessment

## 2020-03-15 NOTE — Progress Notes (Signed)
Brief GI progress note  Patient evaluated in post procedure area. She was complaining of some lower chest pain as well as upper abdominal discomfort. This is slightly different than what she was having in regards to mid epigastric and lower abdominal pain prior to the procedure. Hemodynamically was stable with blood pressures in the 518D to 437D systolically. Heart rates less than 100. Pulse oximetry showed greater than 95%. No evidence of crepitus on exam. Decreased breath sounds at the bases bilaterally. Plan for chest x-ray/neck x-ray/KUB to evaluate for signs/symptoms of perforation. I suspect that her pain is most likely result of the esophageal stent dilating the significant stricture at this time and trying to expand. But will be as thoughtful as possible. I discussed the results and findings with the patient's daughter. I gave the patient 12.5 mg of IV fentanyl during post procedure area to be given in effort of trying to help with chest pain. Follow-up imaging will be important and the inpatient GI team will follow these results up. If patient were to have evidence of a perforation, then I would keep her n.p.o. and the fully covered self-expanding metal stent would be the treatment of choice anyways and it is already in place. If the patient does not have evidence of a perforation and no pneumomediastinum, then patient can be initiated on clear liquids for the next 24 hours. If the patient cannot tolerate the chest discomfort from the stent expansion, then we certainly can consider the role of stent removal in the coming days. Hopefully over the next 72 hours, the chest discomfort can be treated with pain medications as necessary as well as potential GI cocktails in effort of trying to help the spasm and discomfort of the stent trying to expand. The inpatient GI team will follow along I will be available as necessary. I discussed the results with the inpatient GI team as well as the  patient's primary medical service on [facility].   Justice Britain, MD [facility] Office # [phone]

## 2020-03-15 NOTE — Op Note (Signed)
[facility] Patient Name: Lindsey Mullins Procedure Date : 03/15/2020 MRN: [id number] Attending MD: Justice Britain , MD Date of Birth: September 02, 1946 CSN: [id number] Age: 74 Admit Type: Inpatient Procedure:                Upper GI endoscopy Indications:              Dysphagia, Stenosis of the esophagus, For therapy                            of esophageal stenosis Providers:                Justice Britain, MD, Carlyn Reichert, RN, Laverda Sorenson, Technician, Luciana Axe, CRNA Referring MD:             Gerrit Heck, MD, Derek Jack, MD, [facility] Medicines:                General Anesthesia Complications:            No immediate complications. Estimated Blood Loss:     Estimated blood loss was minimal. Procedure:                Pre-Anesthesia Assessment:                           - Prior to the procedure, a History and Physical                            was performed, and patient medications and                            allergies were reviewed. The patient's tolerance of                            previous anesthesia was also reviewed. The risks                            and benefits of the procedure and the sedation                            options and risks were discussed with the patient.                            All questions were answered, and informed consent                            was obtained. Prior Anticoagulants: The patient has                            taken Lovenox (enoxaparin), last dose was 2 days  prior to procedure. ASA Grade Assessment: III - A                            patient with severe systemic disease. After                            reviewing the risks and benefits, the patient was                            deemed in satisfactory condition to undergo the                            procedure.                           After obtaining  informed consent, the endoscope was                            passed under direct vision. Throughout the                            procedure, the patient's blood pressure, pulse, and                            oxygen saturations were monitored continuously. The                            [id number]-[id number] ([id number] Olympus gastroscope was                            introduced through the mouth, and advanced to the                            middle third of esophagus. The [id number]-[id number] ([id number])                            Olympus Ultraslim gastroscope was introduced                            through the mouth, and advanced to the second part                            of duodenum. The upper GI endoscopy was                            accomplished without difficulty. The patient                            tolerated the procedure. Scope In: Scope Out: Findings:      No gross lesions were noted in the proximal esophagus.      One extrinsic severe (stenosis; an endoscope cannot pass) stenosis was       found 28 to 34 cm from the incisors. This stenosis measured 5 mm (inner       diameter) x  6 cm (in length). The stenosis was only traversed after       downsizing the adult EGD scope to a pediatric EGD scope. After some       gentle pressure, the pediatric EGD scope traverse the region and made       notation of the numbers here. After the rest of the EGD was completed,       we proceed with attempt at Esophageal Stent placement. Placement of a       long 0.035 inch Stiff Jagwire was performed and passed successfully into       the stomach and was evaluated endoscopically. This region was stented       with an 18 mm x 10.3 cm WallFlex covered stent under fluoroscopic       guidance, proximal margin at 26 cm from the incisors at the completion       of the procedure.      No gross lesions were noted at the gastroesophageal junction (located at       37 cm).      No gross lesions were noted in the entire  examined stomach.      No gross lesions were noted in the duodenal bulb, in the first portion       of the duodenum and in the second portion of the duodenum. Impression:               - No gross lesions in esophagus proximally.                            Extrinsic narrowing of the esophagus. Endoscopic                            stent was placed.                           - No gross lesions at the GE Junction.                           - No gross lesions in the stomach.                           - No gross lesions in the duodenal bulb, in the                            first portion of the duodenum and in the second                            portion of the duodenum. Recommendation:           - The patient will be observed post-procedure,                            until all discharge criteria are met.                           - Return patient to hospital ward for ongoing care.                           -  Clear liquid diet today for first 24 hours and if                            does well then next 24 hours can transition to Full                            liquid diet.                           - Stent diet to be given to patient/family based on                            how she does.                           - Aggressive Anti-emetics such as                            Zofran/Compazine/Phenergan to decrease risk of                            significant nausea/vomiting.                           - Observe patient's clinical course.                           - Hold heparin VTE PPx for at least 24 hours to                            decrease risk of bleeding post-intervention.                           - The findings and recommendations were discussed                            with the patient.                           - The findings and recommendations were discussed                            with the patient's family.                           - The findings and recommendations  were discussed                            with the referring physician. Procedure Code(s):        --- Professional ---                           [phone], Esophagogastroduodenoscopy, flexible,                            transoral; with placement of endoscopic stent                            (  includes pre- and post-dilation and guide wire                            passage, when performed) Diagnosis Code(s):        --- Professional ---                           K22.2, Esophageal obstruction                           R13.10, Dysphagia, unspecified CPT copyright 2019 American Medical Association. All rights reserved. The codes documented in this report are preliminary and upon coder review may  be revised to meet current compliance requirements. Justice Britain, MD 03/15/2020 1:35:47 PM Number of Addenda: 0

## 2020-03-15 NOTE — Transfer of Care (Signed)
Immediate Anesthesia Transfer of Care Note  Patient: Lindsey Mullins  Procedure(s) Performed: ESOPHAGOGASTRODUODENOSCOPY (EGD) (N/A ) ESOPHAGEAL STENT PLACEMENT (N/A )  Patient Location: Endoscopy Unit  Anesthesia Type:General  Level of Consciousness: awake, oriented and patient cooperative  Airway & Oxygen Therapy: Patient Spontanous Breathing and Patient connected to nasal cannula oxygen  Post-op Assessment: Report given to RN and Post -op Vital signs reviewed and stable  Post vital signs: Reviewed  Last Vitals:  Vitals Value Taken Time  BP    Temp    Pulse 97 03/15/20 1342  Resp 16 03/15/20 1342  SpO2 95 % 03/15/20 1342  Vitals shown include unvalidated device data.  Last Pain:  Vitals:   03/15/20 1117  TempSrc: Oral  PainSc: 0-No pain      Patients Stated Pain Goal: 2 (74/12/87 8676)  Complications: No apparent anesthesia complications

## 2020-03-15 NOTE — Anesthesia Preprocedure Evaluation (Addendum)
Anesthesia Evaluation  Patient identified by MRN, date of birth, ID band Patient awake    Reviewed: Allergy & Precautions, NPO status , Patient's Chart, lab work & pertinent test results  History of Anesthesia Complications Negative for: history of anesthetic complications  Airway Mallampati: II  TM Distance: >3 FB Neck ROM: Full    Dental  (+) Upper Dentures, Lower Dentures, Dental Advisory Given   Pulmonary shortness of breath, former smoker,   Metastatic lung cancer    Pulmonary exam normal breath sounds clear to auscultation       Cardiovascular METS: severe back and hip pain, limited mobility. hypertension, Pt. on medications + Peripheral Vascular Disease  Normal cardiovascular exam Rhythm:Regular Rate:Normal   '20 Carotid US - 40-59% right ICAS, 1-39% left ICAS    Neuro/Psych PSYCHIATRIC DISORDERS TIA Neuromuscular disease (RLS) negative neurological ROS  negative psych ROS   GI/Hepatic Neg liver ROS, GERD  Medicated, Liver metastases   Esophageal stenosis    Endo/Other  negative endocrine ROSHypothyroidism   Renal/GU negative Renal ROS     Musculoskeletal  (+) Arthritis , Osteoarthritis,    Abdominal   Peds  Hematology negative hematology ROS (+) anemia ,   Anesthesia Other Findings Covid neg 4/20  Reproductive/Obstetrics                           Anesthesia Physical  Anesthesia Plan  ASA: III  Anesthesia Plan: General   Post-op Pain Management:    Induction: Intravenous  PONV Risk Score and Plan: 3 and Treatment may vary due to age or medical condition, Ondansetron and Dexamethasone  Airway Management Planned: Oral ETT  Additional Equipment: None  Intra-op Plan:   Post-operative Plan: Extubation in OR  Informed Consent: I have reviewed the patients History and Physical, chart, labs and discussed the procedure including the risks, benefits and alternatives  for the proposed anesthesia with the patient or authorized representative who has indicated his/her understanding and acceptance.     Dental advisory given  Plan Discussed with: CRNA  Anesthesia Plan Comments:      Anesthesia Quick Evaluation

## 2020-03-16 DIAGNOSIS — R6251 Failure to thrive (child): Secondary | ICD-10-CM

## 2020-03-16 DIAGNOSIS — C799 Secondary malignant neoplasm of unspecified site: Secondary | ICD-10-CM

## 2020-03-16 DIAGNOSIS — R0789 Other chest pain: Secondary | ICD-10-CM

## 2020-03-16 DIAGNOSIS — K222 Esophageal obstruction: Secondary | ICD-10-CM

## 2020-03-16 DIAGNOSIS — R1310 Dysphagia, unspecified: Secondary | ICD-10-CM

## 2020-03-16 LAB — COMPREHENSIVE METABOLIC PANEL
ALT: 14 U/L (ref 0–44)
AST: 51 U/L — ABNORMAL HIGH (ref 15–41)
Albumin: 2.4 g/dL — ABNORMAL LOW (ref 3.5–5.0)
Alkaline Phosphatase: 114 U/L (ref 38–126)
Anion gap: 11 (ref 5–15)
BUN: <5 mg/dL — ABNORMAL LOW (ref 8–23)
CO2: 26 mmol/L (ref 22–32)
Calcium: 8.2 mg/dL — ABNORMAL LOW (ref 8.9–10.3)
Chloride: 100 mmol/L (ref 98–111)
Creatinine, Ser: 0.62 mg/dL (ref 0.44–1.00)
GFR calc Af Amer: >60 mL/min (ref >60)
GFR calc non Af Amer: >60 mL/min (ref >60)
Glucose, Bld: 123 mg/dL — ABNORMAL HIGH (ref 70–99)
Potassium: 3.4 mmol/L — ABNORMAL LOW (ref 3.5–5.1)
Sodium: 137 mmol/L (ref 135–145)
Total Bilirubin: 0.7 mg/dL (ref 0.3–1.2)
Total Protein: 5.7 g/dL — ABNORMAL LOW (ref 6.5–8.1)

## 2020-03-16 LAB — CBC
HCT: 26.9 % — ABNORMAL LOW (ref 36.0–46.0)
Hemoglobin: 8.5 g/dL — ABNORMAL LOW (ref 12.0–15.0)
MCH: 30.7 pg (ref 26.0–34.0)
MCHC: 31.6 g/dL (ref 30.0–36.0)
MCV: 97.1 fL (ref 80.0–100.0)
Platelets: 314 10*3/uL (ref 150–400)
RBC: 2.77 MIL/uL — ABNORMAL LOW (ref 3.87–5.11)
RDW: 16.8 % — ABNORMAL HIGH (ref 11.5–15.5)
WBC: 7.7 10*3/uL (ref 4.0–10.5)
nRBC: 0.3 % — ABNORMAL HIGH (ref 0.0–0.2)

## 2020-03-16 LAB — MAGNESIUM: Magnesium: 1.6 mg/dL — ABNORMAL LOW (ref 1.7–2.4)

## 2020-03-16 MED ORDER — POTASSIUM CHLORIDE 10 MEQ/100ML IV SOLN
10.0000 meq | INTRAVENOUS | Status: AC
  Administered 2020-03-16 (×4): 10 meq via INTRAVENOUS
  Filled 2020-03-16 (×4): qty 100

## 2020-03-16 MED ORDER — MAGNESIUM SULFATE 2 GM/50ML IV SOLN
2.0000 g | Freq: Once | INTRAVENOUS | Status: AC
  Administered 2020-03-16: 2 g via INTRAVENOUS
  Filled 2020-03-16: qty 50

## 2020-03-16 MED ORDER — HYDROMORPHONE HCL 1 MG/ML IJ SOLN
0.5000 mg | INTRAMUSCULAR | Status: DC | PRN
  Administered 2020-03-16 – 2020-03-17 (×3): 0.5 mg via INTRAVENOUS
  Filled 2020-03-16 (×3): qty 1

## 2020-03-16 MED ORDER — METOCLOPRAMIDE HCL 5 MG/ML IJ SOLN
5.0000 mg | Freq: Four times a day (QID) | INTRAMUSCULAR | Status: AC
  Administered 2020-03-16 – 2020-03-18 (×8): 5 mg via INTRAVENOUS
  Filled 2020-03-16 (×9): qty 2

## 2020-03-16 MED ORDER — POTASSIUM CHLORIDE 20 MEQ/15ML (10%) PO SOLN
40.0000 meq | Freq: Once | ORAL | Status: DC
  Filled 2020-03-16: qty 30

## 2020-03-16 NOTE — Progress Notes (Signed)
Physical Therapy Treatment Patient Details Name: Lindsey Mullins MRN: [id number] DOB: Sep 15, 1946 Today's Date: 03/16/2020    History of Present Illness Lindsey Mullins is a 74 y.o. female with PMH significant for metastatic lung cancer, HTN, hypothyroidism who was sent to the ED from the cancer center with reports of generalized weakness, abdominal/back pain and constipation.  Pt was in the ED 4/18 for similar problems, abdominal CT- showed increase in size of hepatic metastatic disease, showed moderate stool burden in ascending and proximal transverse colon. Admitted with failure to thrive in the setting of metastatic lung cancer and hyponatremia. s/p EGD with esophageal stent on 03/15/20.    PT Comments    Pt supine in bed sleeping.. Daughter at bedside.  Pt too painful to mobilize OOB but she participated in LE exercises and PTA educated daughter on continued exercise to manage ROM and strength on her bad days.  Family has now decided to take patient home and she will need the following equipment listed below. Informed CSW and CM of equipment needs listed below.     Follow Up Recommendations  SNF(family refusing snf so max out HHPT, and HH aide.)     Equipment Recommendations  Rolling walker with 5" wheels;Wheelchair (measurements PT);Wheelchair cushion (measurements PT);Hospital bed    Recommendations for Other Services       Precautions / Restrictions Precautions Precautions: Fall Restrictions Weight Bearing Restrictions: No    Mobility  Bed Mobility               General bed mobility comments: deferred OOB due to intolerable pain.  Transfers                    Ambulation/Gait                 Stairs             Wheelchair Mobility    Modified Rankin (Stroke Patients Only)       Balance                                            Cognition Arousal/Alertness: Awake/alert Behavior During Therapy: WFL for tasks  assessed/performed Overall Cognitive Status: Within Functional Limits for tasks assessed                                        Exercises General Exercises - Lower Extremity Ankle Circles/Pumps: AROM;Both;10 reps;Supine Quad Sets: AROM;Both;10 reps;Supine Heel Slides: AAROM;Both;10 reps;Supine;AROM Hip ABduction/ADduction: AAROM;Both;10 reps;Supine;AROM    General Comments        Pertinent Vitals/Pain Pain Assessment: Faces Faces Pain Scale: Hurts whole lot Pain Location: left hip/back and chest Pain Descriptors / Indicators: Sore;Aching;Discomfort;Grimacing Pain Intervention(s): Monitored during session;Limited activity within patient's tolerance;Patient requesting pain meds-RN notified    Home Living                      Prior Function            PT Goals (current goals can now be found in the care plan section) Acute Rehab PT Goals Patient Stated Goal: return home able to walk after rehab PT Goal Formulation: With patient Potential to Achieve Goals: Good Progress towards PT goals: Progressing toward goals    Frequency  Min 3X/week      PT Plan Current plan remains appropriate    Co-evaluation              AM-PAC PT "6 Clicks" Mobility   Outcome Measure  Help needed turning from your back to your side while in a flat bed without using bedrails?: A Lot Help needed moving from lying on your back to sitting on the side of a flat bed without using bedrails?: A Lot Help needed moving to and from a bed to a chair (including a wheelchair)?: A Lot Help needed standing up from a chair using your arms (e.g., wheelchair or bedside chair)?: A Lot Help needed to walk in hospital room?: A Little Help needed climbing 3-5 steps with a railing? : Total 6 Click Score: 12    End of Session Equipment Utilized During Treatment: Gait belt Activity Tolerance: Patient limited by fatigue;Patient limited by pain Patient left: in bed;with call  bell/phone within reach;with bed alarm set;with family/visitor present Nurse Communication: Mobility status PT Visit Diagnosis: Unsteadiness on feet (R26.81);Other abnormalities of gait and mobility (R26.89);Muscle weakness (generalized) (M62.81);Pain Pain - Right/Left: Left Pain - part of body: Hip(back)     Time: 5038-8828 PT Time Calculation (min) (ACUTE ONLY): 24 min  Charges:  $Therapeutic Exercise: 8-22 mins $Self Care/Home Management: 8-22                     Erasmo Leventhal , PTA Acute Rehabilitation Services Pager [phone] Office [phone]     Lindsey Mullins 03/16/2020, 3:48 PM

## 2020-03-16 NOTE — Progress Notes (Signed)
Nutrition Follow-up  DOCUMENTATION CODES:   Not applicable  INTERVENTION:   -D/c Dillard Essex -Continue Boost Breeze po TID, each supplement provides 250 kcal and 9 grams of protein -RD will follow for diet advancement and adjust supplements regimen as appropriate -If prolonged clear liquid diet status is anticipated, consider initiation of nutrition support if within pt's goals of care  NUTRITION DIAGNOSIS:   Inadequate oral intake related to cancer and cancer related treatments(metastatic lung cancer currently on chemotherapy) as evidenced by per patient/family report(progessive swallowing difficulties and tolerating only thin liquids over the past 2 weeks).  Ongoing  GOAL:   Patient will meet greater than or equal to 90% of their needs  Unmet  MONITOR:   PO intake, Supplement acceptance, Weight trends, Labs, I & O's  REASON FOR ASSESSMENT:   Malnutrition Screening Tool    ASSESSMENT:   74 year old female with past medical history of metastatic lung cancer, currently on chemo/RXT therapies and followed by Dr. Delton Coombes, HTN, hypothyroidism, GERD, HLD, RLS, history of colitis, presented from the cancer center with generalized weakness, abdominal pain, back pain, and constipation. Patient recently seen in ED on 4/18 for similar symptoms, abdominal CT without evidence of perforation or bowel obstruction, but showed increase in size of hepatic metastatic disease, moderate stool burden in ascending and proximal transverse colon and discharged home with bowel regimen.  4/24- s/p BSE- thin liquids 4/27- s/p EGD with esophageal stent  Reviewed I/O's: +4.5 L x 24 hours and +9.1 L since admission  Pt resting quietly at time of visit. No family at bedside. RD did not disturb.  Pt currently on a clear liquid diet. No meal completion data documented. She is accepting [employer], however, refusing Dillard Essex supplements.   Per GI, plan for x-rays prior to diet advancement. Pt has  been without adequate nutrition over the past 7 days; suspect that this is contributing to lack of BM. Palliative care team has been following for goals of care discussions.   Medications reviewed and include dextrose 5%-0.9% sodium chloride infusion @ 75 ml/hr.   Last documented BM on 03/11/20; pt with multiple laxatives and stool softeners (mag citrate, miralax, senna).   Labs reviewed: K: 3.4, Mg: 1.6.  Diet Order:   Diet Order            Diet clear liquid Room service appropriate? Yes; Fluid consistency: Thin  Diet effective now              EDUCATION NEEDS:   No education needs have been identified at this time  Skin:  Skin Assessment: Reviewed RN Assessment  Last BM:  03/11/20  Height:   Ht Readings from Last 1 Encounters:  03/08/20 5\' 6"  (1.676 m)    Weight:   Wt Readings from Last 1 Encounters:  03/08/20 74.3 kg   BMI:  Body mass index is 26.44 kg/m.  Estimated Nutritional Needs:   Kcal:  4917-9150  Protein:  90-105 grams  Fluid:  > 1.7 L    Loistine Chance, RD, LDN, [place] Registered Dietitian II Certified Diabetes Care and Education Specialist Please refer to [facility] for RD and/or RD on-call/weekend/after hours pager

## 2020-03-16 NOTE — Plan of Care (Signed)
  Problem: Education: Goal: Knowledge of General Education information will improve Description Including pain rating scale, medication(s)/side effects and non-pharmacologic comfort measures Outcome: Progressing   

## 2020-03-16 NOTE — Care Management (Signed)
    Durable Medical Equipment  (From admission, onward)         Start     Ordered   03/16/20 1628  For home use only DME lightweight manual wheelchair with seat cushion  Once    Comments: Patient suffers from Metastatic lung cancer with severe extrinsic esophageal stenosis, failure to thrive / dysphagia which impairs their ability to perform daily activities like ambulating   in the home.  A cane will not resolve  issue with performing activities of daily living. A wheelchair will allow patient to safely perform daily activities. Patient is not able to propel themselves in the home using a standard weight wheelchair due to Metastatic lung cancer with severe extrinsic esophageal stenosis, failure to thrive / dysphagia. Patient can self propel in the lightweight wheelchair. Length of need lifetime . Accessories: elevating leg rests (ELRs), wheel locks, extensions and anti-tippers.  Seat and back cushion   03/16/20 1628   03/16/20 1626  For home use only DME Walker rolling  Once    Question Answer Comment  Walker: With Luis Lopez   Patient needs a walker to treat with the following condition Weakness      03/16/20 1628   03/16/20 1626  For home use only DME Hospital bed  Once    Comments: HT 5'6", wt 74.3 kg   Called daughter Ladonna Vanorder [phone] or daughter Griffin Dakin [phone] for delivery  Question Answer Comment  Length of Need Lifetime   Patient has (list medical condition): Metastatic lung cancer with severe extrinsic esophageal stenosis, failure to thrive / dysphagia   The above medical condition requires: Patient requires the ability to reposition frequently   Head must be elevated greater than: 45 degrees   Bed type Semi-electric   Support Surface: Gel Overlay      03/16/20 1628

## 2020-03-16 NOTE — Plan of Care (Signed)

## 2020-03-16 NOTE — Progress Notes (Signed)
Pt still c/o esophageal pain 8/10 despite Dilaudid 0.25 mg IV given. Lang Snow NP notified with new order.

## 2020-03-16 NOTE — Progress Notes (Signed)
Daily Rounding Note  03/16/2020, 11:47 AM  LOS: 7 days   SUBJECTIVE:   Chief complaint:  Dysphagia, esoph stricture  Minor chest discomfort. Some clear reflux as well as coughing, clearing of clear mucus. Tired, weak. Does not like the clear liquids so not eating much. Small stool.  OBJECTIVE:         Vital signs in last 24 hours:    Temp:  [97.5 F (36.4 C)-98.4 F (36.9 C)] 97.6 F (36.4 C) (04/28 0559) Pulse Rate:  [73-96] 81 (04/28 0559) Resp:  [16-19] 16 (04/28 0732) BP: (154-184)/(80-100) 169/87 (04/28 0559) SpO2:  [98 %-100 %] 100 % (04/28 0559) Last BM Date: 03/11/20 Filed Weights   03/08/20 1139 03/08/20 1815  Weight: 73.5 kg 74.3 kg   General: Looks ill, exhausted, comfortable Heart: RRR Chest: No labored breathing, no cough Abdomen: Soft.  Nontender, nondistended.  Active bowel sounds. Extremities: No CCE. Neuro/Psych: Has very little to say.  Not obtunded but not participating in conversation.  Intake/Output from previous day: 04/27 0701 - 04/28 0700 In: 4479.1 [I.V.:4229.1; IV Piggyback:250] Out: -   Intake/Output this shift: No intake/output data recorded.  Lab Results: Recent Labs    03/15/20 0448 03/16/20 0350  WBC 7.2 7.7  HGB 8.8* 8.5*  HCT 27.9* 26.9*  PLT 338 314   BMET Recent Labs    03/15/20 0448 03/16/20 0350  NA 135 137  K 3.4* 3.4*  CL 100 100  CO2 24 26  GLUCOSE 115* 123*  BUN <5* <5*  CREATININE 0.65 0.62  CALCIUM 8.2* 8.2*   LFT Recent Labs    03/15/20 0448 03/16/20 0350  PROT 6.1* 5.7*  ALBUMIN 2.4* 2.4*  AST 56* 51*  ALT 16 14  ALKPHOS 127* 114  BILITOT 0.8 0.7   PT/INR No results for input(s): LABPROT, INR in the last 72 hours. Hepatitis Panel No results for input(s): HEPBSAG, HCVAB, HEPAIGM, HEPBIGM in the last 72 hours.  Studies/Results: DG Neck Soft Tissue  Result Date: 03/15/2020 CLINICAL DATA:  74 year old female with chest pain and  crepitus after EGD and esophageal stent placement. EXAM: NECK SOFT TISSUES - 1+ VIEW COMPARISON:  Neck radiographs 03/10/2008. FINDINGS: Portable lateral view of the neck. Normal prevertebral soft tissue contour. Hypopharynx and epiglottis contours appear normal. No abnormal soft tissue gas identified on this lateral view. Absent dentition. Mild for age cervical disc and endplate degeneration. No acute osseous abnormality identified. Evidence of calcified carotid atherosclerosis at the bifurcations. IMPRESSION: No subcutaneous gas or acute findings identified on portable lateral view of the neck. Electronically Signed   By: Genevie Ann M.D.   On: 03/15/2020 14:50   DG CHEST PORT 1 VIEW  Result Date: 03/15/2020 CLINICAL DATA:  Post esophageal stent placement. EXAM: PORTABLE CHEST 1 VIEW COMPARISON:  03/06/2020 FINDINGS: Left subclavian Port-A-Cath unchanged with tip over the SVC. Interval placement of esophageal stent over the mid to lower esophagus appears in adequate position. Lungs are adequately inflated demonstrate slight interval worsening of opacification over the right mid to lower lung likely small to moderate effusion with associated basilar atelectasis. Possible small amount of left basilar pleural fluid/atelectasis. Mild hazy prominence of the perihilar vessels suggesting mild vascular congestion. Cardiomediastinal silhouette and remainder of the exam is unchanged. IMPRESSION: 1. Findings suggesting minimal vascular congestion with slight worsening opacification over the right mid to lower lung likely effusion with associated atelectasis. Minimal left base opacification likely effusion/atelectasis. 2. Interval placement of esophageal  stent over the mid to lower esophagus. Left subclavian Port-A-Cath unchanged. Electronically Signed   By: Marin Olp M.D.   On: 03/15/2020 14:46   DG Abd Portable 1V  Result Date: 03/15/2020 CLINICAL DATA:  Esophageal stent placement EXAM: PORTABLE ABDOMEN - 1 VIEW  COMPARISON:  03/06/2020 FINDINGS: Partially visualized stent material at the level of the GE junction. The bowel gas pattern is nonobstructive. No radio-opaque calculi or other significant radiographic abnormality are seen. IMPRESSION: 1. Partially visualized stent material at the level of the GE junction. 2. Nonobstructive bowel gas pattern. Electronically Signed   By: Davina Poke D.O.   On: 03/15/2020 14:47   DG Esophagus Dilatation  Result Date: 03/15/2020 ESOPHAGEAL DILATATION: Fluoroscopy was provided for use by the requesting physician.  No images were obtained for radiographic interpretation.   ASSESMENT:   *  Dysphagia. EGD by Dr. Laural Golden 03/04/2020.  Severe extrinsic stenosis, traversed with ultrasound slim scope. EGD 03/15/2020, extrinsic esophageal narrowing, stent placed. Clear liquid diet for 24 hours post procedure and advance to full liquids as tolerated.  Aggressive antiemetics with Zofran/Compazine/Phenergan to reduce risk of nausea vomiting.  *    Metastatic lung cancer. Mets to liver, spleen, lymph nodes and suspected malignant ascites based on CT of 03/06/2020.    PLAN   *   Add low-dose IV Reglan, 5 mg every 6 hours for a couple of days. Advance to full liquid diet.    Azucena Freed  03/16/2020, 11:47 AM Phone [phone]

## 2020-03-16 NOTE — Progress Notes (Signed)
PROGRESS NOTE  Lindsey Mullins CBJ:[id number] DOB: 03-26-1946 DOA: 03/08/2020 PCP: [name], MD   LOS: 7 days   Brief Narrative / Interim history: 74 year old female with history of metastatic lung cancer, HTN, hypothyroidism, initially presented to [facility] with generalized weakness, abdominal pain, FTT and poor p.o. intake.  She underwent an abdominal CT which did not show any perforation or bowel obstruction but did show increased size of hepatic metastatic disease.  Eventually GI was consulted and underwent an EGD on 4/16 which showed severe extrinsic stenosis of the esophagus.  After many discussions she was transferred to [facility] and eventually ended up having a palliative esophageal stent placed on 4/27.  Subjective / 24h Interval events: She is alert but does not talk much this morning.  Apparently she got pain medications just an hour and a half ago and her daughter tells me that she is more lethargic than her normal self.  Assessment & Plan: Principal Problem Metastatic lung cancer with severe extrinsic esophageal stenosis, failure to thrive / dysphagia-she is status post esophageal stent on 4/27. Difficulties evaluating her symptoms since she is not telling me much, seems to be in ongoing pain given requirement for IV Dilaudid x2 overnight.  Advance diet per GI, she was placed on Reglan along with IV PPI and antiemetics.  Active Problems Hypokalemia/hypomagnesemia-continue to replete and monitor.  Anemia of chronic disease/metastatic cancer-no bleeding, hemoglobin 8.5 and stable.  Hypothyroidism-continue Synthroid, TSH was found to be quite elevated and the Synthroid dose has been increased  Hypertension-as needed labetalol   Scheduled Meds: . Chlorhexidine Gluconate Cloth  6 each Topical Daily  . enoxaparin (LOVENOX) injection  40 mg Subcutaneous Q24H  . feeding supplement  1 Container Oral TID BM  . levothyroxine  75 mcg Oral QAC breakfast  .  magnesium citrate  1 Bottle Oral Once  . metoCLOPramide (REGLAN) injection  5 mg Intravenous Q6H  . naloxegol oxalate  25 mg Oral Daily  . pantoprazole (PROTONIX) IV  40 mg Intravenous Q24H  . polyethylene glycol  17 g Oral BID  . senna-docusate  1 tablet Oral BID   Continuous Infusions: . dextrose 5 % and 0.9% NaCl 75 mL/hr at 03/16/20 0623   PRN Meds:.acetaminophen **OR** acetaminophen, alum & mag hydroxide-simeth, HYDROmorphone (DILAUDID) injection, labetalol, ondansetron (ZOFRAN) IV, polyethylene glycol, sodium chloride flush  DVT prophylaxis: Lovenox Code Status: Full code Family Communication: daughter at bedside  Patient admitted from: home Anticipated d/c place: TBD Barriers to d/c: Remains symptomatic even post esophageal stent, slowly advance diet as tolerated  Consultants:  Gastroenterology  Procedures:  EGD/stent placement 4/27  Microbiology  None  Antimicrobials: None   Objective: Vitals:   03/15/20 2132 03/15/20 2300 03/16/20 0559 03/16/20 0732  BP: (!) 184/100 (!) 154/83 (!) 169/87   Pulse: 96 73 81   Resp: 16  18 16   Temp: 98.3 F (36.8 C)  97.6 F (36.4 C)   TempSrc: Oral  Oral   SpO2: 100%  100%   Weight:      Height:        Intake/Output Summary (Last 24 hours) at 03/16/2020 1412 Last data filed at 03/16/2020 0400 Gross per 24 hour  Intake 4029.13 ml  Output --  Net 4029.13 ml   Filed Weights   03/08/20 1139 03/08/20 1815  Weight: 73.5 kg 74.3 kg    Examination:  Constitutional: Appears weak Eyes: no scleral icterus ENMT: Mucous membranes are moist.  Neck: normal, supple Respiratory: clear to  auscultation bilaterally, no wheezing, no crackles. Normal respiratory effort. Cardiovascular: Regular rate and rhythm, no murmurs / rubs / gallops. No LE edema. Abdomen: non distended, no tenderness. Bowel sounds positive.  Musculoskeletal: no clubbing / cyanosis.  Skin: no rashes Neurologic: Nonfocal, generalized weakness present  Data  Reviewed: I have independently reviewed following labs and imaging studies   CBC: Recent Labs  Lab 03/15/20 0448 03/16/20 0350  WBC 7.2 7.7  HGB 8.8* 8.5*  HCT 27.9* 26.9*  MCV 98.2 97.1  PLT 338 270   Basic Metabolic Panel: Recent Labs  Lab 03/11/20 0628 03/13/20 0606 03/15/20 0448 03/16/20 0350  NA 133* 134* 135 137  K 2.9* 3.7 3.4* 3.4*  CL 99 102 100 100  CO2 23 23 24 26   GLUCOSE 105* 100* 115* 123*  BUN 6* <5* <5* <5*  CREATININE 0.57 0.49 0.65 0.62  CALCIUM 8.0* 8.3* 8.2* 8.2*  MG  --  1.7 1.7 1.6*  PHOS  --   --  3.1  --    Liver Function Tests: Recent Labs  Lab 03/15/20 0448 03/16/20 0350  AST 56* 51*  ALT 16 14  ALKPHOS 127* 114  BILITOT 0.8 0.7  PROT 6.1* 5.7*  ALBUMIN 2.4* 2.4*   Coagulation Profile: No results for input(s): INR, PROTIME in the last 168 hours. HbA1C: No results for input(s): HGBA1C in the last 72 hours. CBG: No results for input(s): GLUCAP in the last 168 hours.  Recent Results (from the past 240 hour(s))  SARS CORONAVIRUS 2 (TAT 6-24 HRS) Nasopharyngeal Urine, Clean Catch     Status: None   Collection Time: 03/08/20  2:14 PM   Specimen: Urine, Clean Catch; Nasopharyngeal  Result Value Ref Range Status   SARS Coronavirus 2 NEGATIVE NEGATIVE Final    Comment: (NOTE) SARS-CoV-2 target nucleic acids are NOT DETECTED. The SARS-CoV-2 RNA is generally detectable in upper and lower respiratory specimens during the acute phase of infection. Negative results do not preclude SARS-CoV-2 infection, do not rule out co-infections with other pathogens, and should not be used as the sole basis for treatment or other patient management decisions. Negative results must be combined with clinical observations, patient history, and epidemiological information. The expected result is Negative. Fact Sheet for Patients: SugarRoll.be Fact Sheet for Healthcare Providers: https://www.woods-mathews.com/ This  test is not yet approved or cleared by the [place] FDA and  has been authorized for detection and/or diagnosis of SARS-CoV-2 by FDA under an Emergency Use Authorization (EUA). This EUA will remain  in effect (meaning this test can be used) for the duration of the COVID-19 declaration under Section 56 4(b)(1) of the Act, 21 U.S.C. section 360bbb-3(b)(1), unless the authorization is terminated or revoked sooner. Performed at [facility], [place] [address], [place], [place] [address]      Radiology Studies: DG Neck Soft Tissue  Result Date: 03/15/2020 CLINICAL DATA:  74 year old female with chest pain and crepitus after EGD and esophageal stent placement. EXAM: NECK SOFT TISSUES - 1+ VIEW COMPARISON:  Neck radiographs 03/10/2008. FINDINGS: Portable lateral view of the neck. Normal prevertebral soft tissue contour. Hypopharynx and epiglottis contours appear normal. No abnormal soft tissue gas identified on this lateral view. Absent dentition. Mild for age cervical disc and endplate degeneration. No acute osseous abnormality identified. Evidence of calcified carotid atherosclerosis at the bifurcations. IMPRESSION: No subcutaneous gas or acute findings identified on portable lateral view of the neck. Electronically Signed   By: Genevie Ann M.D.   On: 03/15/2020  14:50   DG CHEST PORT 1 VIEW  Result Date: 03/15/2020 CLINICAL DATA:  Post esophageal stent placement. EXAM: PORTABLE CHEST 1 VIEW COMPARISON:  03/06/2020 FINDINGS: Left subclavian Port-A-Cath unchanged with tip over the SVC. Interval placement of esophageal stent over the mid to lower esophagus appears in adequate position. Lungs are adequately inflated demonstrate slight interval worsening of opacification over the right mid to lower lung likely small to moderate effusion with associated basilar atelectasis. Possible small amount of left basilar pleural fluid/atelectasis. Mild hazy prominence of the perihilar vessels suggesting mild  vascular congestion. Cardiomediastinal silhouette and remainder of the exam is unchanged. IMPRESSION: 1. Findings suggesting minimal vascular congestion with slight worsening opacification over the right mid to lower lung likely effusion with associated atelectasis. Minimal left base opacification likely effusion/atelectasis. 2. Interval placement of esophageal stent over the mid to lower esophagus. Left subclavian Port-A-Cath unchanged. Electronically Signed   By: Marin Olp M.D.   On: 03/15/2020 14:46   DG Abd Portable 1V  Result Date: 03/15/2020 CLINICAL DATA:  Esophageal stent placement EXAM: PORTABLE ABDOMEN - 1 VIEW COMPARISON:  03/06/2020 FINDINGS: Partially visualized stent material at the level of the GE junction. The bowel gas pattern is nonobstructive. No radio-opaque calculi or other significant radiographic abnormality are seen. IMPRESSION: 1. Partially visualized stent material at the level of the GE junction. 2. Nonobstructive bowel gas pattern. Electronically Signed   By: Davina Poke D.O.   On: 03/15/2020 14:47   Marzetta Board, MD, PhD [facility]  Between 7 am - 7 pm I am available, please contact me via Amion or Securechat  Between 7 pm - 7 am I am not available, please contact night coverage MD/APP via Amion

## 2020-03-16 NOTE — Anesthesia Postprocedure Evaluation (Signed)
Anesthesia Post Note  Patient: Lindsey Mullins  Procedure(s) Performed: ESOPHAGOGASTRODUODENOSCOPY (EGD) (N/A ) ESOPHAGEAL STENT PLACEMENT (N/A )     Anesthesia Post Evaluation  Last Vitals:  Vitals:   03/16/20 0559 03/16/20 0732  BP: (!) 169/87   Pulse: 81   Resp: 18 16  Temp: 36.4 C   SpO2: 100%     Last Pain:  Vitals:   03/16/20 0732  TempSrc:   PainSc: [place]

## 2020-03-16 NOTE — TOC Initial Note (Signed)
Transition of Care [facility]) - Initial/Assessment Note    Patient Details  Name: Lindsey Mullins MRN: [id number] Date of Birth: 1946/10/11  Transition of Care [facility]) CM/SW Contact:    Alexander Mt, LCSW Phone Number: 03/16/2020, 1:39 PM  Clinical Narrative:                 CSW spoke with pt daughter Lindsey Mullins at bedside. Confirmed home address and PCP. Pt lethargic but okay with CSW visit. Pt tx from [facility], she has a daughter who lives locally and a daughter in [place]. Pt does not have any DME at home, usually leans on her daughter as needed. Pt daughter Lindsey Mullins has spoken with her sister Lindsey Mullins and they were leaning more towards taking pt home w/ HH instead of a skilled nursing facility. Lindsey Mullins understands these would be short visits with care additionally needed to be provided by family. CSW spoke with [facility] regarding this interest in home and also touched base with Aimee, PTA, to ensure we had DME recs. TOC team continues to follow.    Expected Discharge Plan: [place] Barriers to Discharge: Continued Medical Work up   Patient Goals and CMS Choice CMS Medicare.gov Compare Post Acute Care list provided to:: Patient Represenative (must comment)(pt daughters) Choice offered to / list presented to : Adult Children  Expected Discharge Plan and Services Expected Discharge Plan: [place] In-house Referral: Clinical Social Work Discharge Planning Services: CM Consult Post Acute Care Choice: Durable Medical Equipment, Home Health Living arrangements for the past 2 months: [place]  Prior Living Arrangements/Services Living arrangements for the past 2 months: Single Family Home Lives with:: Self Patient language and need for interpreter reviewed:: Yes(no needs) Do you feel safe going back to the place where you live?: Yes      Need for Family Participation in Patient Care: Yes (Comment)(assistance w/ daily cares) Care giver support system  in place?: Yes (comment)(pt adult daughters)   Criminal Activity/Legal Involvement Pertinent to Current Situation/Hospitalization: No - Comment as needed  Activities of Daily Living Home Assistive Devices/Equipment: None ADL Screening (condition at time of admission) Patient's cognitive ability adequate to safely complete daily activities?: Yes Is the patient deaf or have difficulty hearing?: No Does the patient have difficulty seeing, even when wearing glasses/contacts?: No Does the patient have difficulty concentrating, remembering, or making decisions?: No Patient able to express need for assistance with ADLs?: Yes Does the patient have difficulty dressing or bathing?: Yes Independently performs ADLs?: No Communication: Independent with device (comment), Needs assistance Is this a change from baseline?: Pre-admission baseline Dressing (OT): Needs assistance Is this a change from baseline?: Pre-admission baseline Grooming: Needs assistance Is this a change from baseline?: Pre-admission baseline Feeding: Needs assistance Bathing: Needs assistance Is this a change from baseline?: Pre-admission baseline Toileting: Needs assistance Is this a change from baseline?: Pre-admission baseline In/Out Bed: Needs assistance Is this a change from baseline?: Pre-admission baseline Walks in Home: Needs assistance Does the patient have difficulty walking or climbing stairs?: Yes Weakness of Legs: Both Weakness of Arms/Hands: Both  Permission Sought/Granted Permission sought to share information with : Family Supports Permission granted to share information with : Yes, Verbal Permission Granted  Share Information with NAME: Lindsey Mullins  Permission granted to share info w AGENCY: [place]  Permission granted to share info w Relationship: daughter  Permission granted to share info w Contact Information: [phone]  Emotional Assessment Appearance:: Appears stated  age Attitude/Demeanor/Rapport:  Lethargic Affect (typically observed): Quiet Orientation: : Oriented to Self, Oriented to Place, Oriented to Situation, Oriented to  Time Alcohol / Substance Use: Not Applicable Psych Involvement: (n/a)  Admission diagnosis:  Failure to thrive (0-17) [R62.51] Failure to thrive in adult [R62.7] Patient Active Problem List   Diagnosis Date Noted  . Failure to thrive in adult 03/09/2020  . Palliative care by specialist   . Left low back pain   . Failure to thrive (0-17) 03/08/2020  . Hypothyroidism 01/26/2020  . Peripheral edema 09/11/2019  . Malignant neoplasm of right lung ([place])   . Goals of care, counseling/discussion 06/10/2019  . Squamous cell carcinoma of lung, stage IV, right ([place]) 05/28/2019  . Metastatic cancer ([place]) 05/26/2019  . Cervical lymphadenopathy   . Compression fracture of L2 ([place]) 04/29/2019  . Arteriosclerosis, mesenteric artery ([place]) 12/31/2018  . Abdominal pain 12/22/2018  . Colitis 12/22/2018  . Osteopenia   . DDD (degenerative disc disease), lumbar 05/11/2016  . Obesity 05/11/2016  . Glucose intolerance (impaired glucose tolerance) 01/12/2016  . Pain in the chest   . Chest pain 01/10/2016  . GERD (gastroesophageal reflux disease) 01/26/2015  . Stress and adjustment reaction 01/26/2015  . Carotid artery disease ([place]) 04/20/2013  . Bunion, left 09/21/2012  . Overweight(278.02) 06/04/2012  . Essential hypertension, benign 04/02/2012  . Hyperlipidemia 04/02/2012  . Dysphagia 04/02/2012  . RLS (restless legs syndrome) 04/02/2012   PCP:  [name], MD Pharmacy:   [facility] [address], [place] - [place] [address] [place] [address] [place] [address] Phone: [phone] Fax: [phone]   Readmission Risk Interventions No flowsheet data found.

## 2020-03-16 NOTE — TOC Initial Note (Addendum)
Transition of Care [facility]) - Initial/Assessment Note    Patient Details  Name: Lindsey Mullins MRN: [id number] Date of Birth: 1946-09-10  Transition of Care [facility]) CM/SW Contact:    Marilu Favre, RN Phone Number: 03/16/2020, 4:13 PM  Clinical Narrative:                 Spoke to patient daughter Jawana Reagor. Confirmed face sheet information. Discussed PT recommendation for SNF. Phyllis Ginger prefers to take her mother home with home health. Family can provided 24/7 care.  NCM will order wheel chair, hospital bed and walker. Shertia in agreement, [employer] can call her for delivery or her sister Griffin Dakin [phone] for delivery.   Provided medicare.gov list of home health agencies. Will follow up with [facility] for decision.  1710 Received a phone call from  [facility]. Explained home health PT would only make about 3 visits per week and stay 45 minutes at a time.  Portia [place] [phone] voiced understanding. She asked about [facility], NCM explained that [employer] is a private duty service, family would need to arrange and pay privately.  Portia [place] [phone] voiced understanding.   [name] has Medicare.gov list and has no preference in home health agency.Messaged Tiffany with [facility] will follow up in am.  Expected Discharge Plan: [place] Barriers to Discharge: Continued Medical Work up   Patient Goals and CMS Choice Patient states their goals for this hospitalization and ongoing recovery are:: to return to home CMS Medicare.gov Compare Post Acute Care list provided to:: Patient Choice offered to / list presented to : Patient, Adult Children  Expected Discharge Plan and Services Expected Discharge Plan: [place] In-house Referral: Clinical Social Work Discharge Planning Services: CM Consult Post Acute Care Choice: Home Health, Durable Medical Equipment Living arrangements for the past 2 months:  [place]                 DME Arranged: Hospital bed, Walker rolling, Wheelchair manual DME Agency: [facility]       HH Arranged: PT, Nurse's Aide          Prior Living Arrangements/Services Living arrangements for the past 2 months: Single Family Home Lives with:: Adult Children Patient language and need for interpreter reviewed:: Yes Do you feel safe going back to the place where you live?: Yes      Need for Family Participation in Patient Care: Yes (Comment) Care giver support system in place?: Yes (comment)   Criminal Activity/Legal Involvement Pertinent to Current Situation/Hospitalization: No - Comment as needed  Activities of Daily Living Home Assistive Devices/Equipment: None ADL Screening (condition at time of admission) Patient's cognitive ability adequate to safely complete daily activities?: Yes Is the patient deaf or have difficulty hearing?: No Does the patient have difficulty seeing, even when wearing glasses/contacts?: No Does the patient have difficulty concentrating, remembering, or making decisions?: No Patient able to express need for assistance with ADLs?: Yes Does the patient have difficulty dressing or bathing?: Yes Independently performs ADLs?: No Communication: Independent with device (comment), Needs assistance Is this a change from baseline?: Pre-admission baseline Dressing (OT): Needs assistance Is this a change from baseline?: Pre-admission baseline Grooming: Needs assistance Is this a change from baseline?: Pre-admission baseline Feeding: Needs assistance Bathing: Needs assistance Is this a change from baseline?: Pre-admission baseline Toileting: Needs assistance Is this a change from baseline?: Pre-admission baseline In/Out Bed: Needs assistance  Is this a change from baseline?: Pre-admission baseline Walks in Home: Needs assistance Does the patient have difficulty walking or climbing stairs?: Yes Weakness of Legs: Both Weakness  of Arms/Hands: Both  Permission Sought/Granted Permission sought to share information with : Family Supports Permission granted to share information with : Yes, Verbal Permission Granted  Share Information with NAME: Danni Shima [address] [place] [address] both daughters  Permission granted to share info w AGENCY: [place] granted to share info w Relationship: daughter  Permission granted to share info w Contact Information: [phone]  Emotional Assessment Appearance:: Appears stated age Attitude/Demeanor/Rapport: Lethargic Affect (typically observed): Quiet Orientation: : Oriented to Self, Oriented to Place, Oriented to Situation, Oriented to  Time Alcohol / Substance Use: Not Applicable Psych Involvement: No (comment)  Admission diagnosis:  Failure to thrive (0-17) [R62.51] Failure to thrive in adult [R62.7] Patient Active Problem List   Diagnosis Date Noted  . Failure to thrive in adult 03/09/2020  . Palliative care by specialist   . Left low back pain   . Failure to thrive (0-17) 03/08/2020  . Hypothyroidism 01/26/2020  . Peripheral edema 09/11/2019  . Malignant neoplasm of right lung ([place])   . Goals of care, counseling/discussion 06/10/2019  . Squamous cell carcinoma of lung, stage IV, right ([place]) 05/28/2019  . Metastatic cancer ([place]) 05/26/2019  . Cervical lymphadenopathy   . Compression fracture of L2 ([place]) 04/29/2019  . Arteriosclerosis, mesenteric artery ([place]) 12/31/2018  . Abdominal pain 12/22/2018  . Colitis 12/22/2018  . Osteopenia   . DDD (degenerative disc disease), lumbar 05/11/2016  . Obesity 05/11/2016  . Glucose intolerance (impaired glucose tolerance) 01/12/2016  . Pain in the chest   . Chest pain 01/10/2016  . GERD (gastroesophageal reflux disease) 01/26/2015  . Stress and adjustment reaction 01/26/2015  . Carotid artery disease ([place]) 04/20/2013  . Bunion, left 09/21/2012  . Overweight(278.02) 06/04/2012  . Essential  hypertension, benign 04/02/2012  . Hyperlipidemia 04/02/2012  . Dysphagia 04/02/2012  . RLS (restless legs syndrome) 04/02/2012   PCP:  [name], MD Pharmacy:   [facility] [address], [place] - [place] [id number] [place] [address] [place] [address] Phone: [phone] Fax: [phone]     Social Determinants of Health (SDOH) Interventions    Readmission Risk Interventions No flowsheet data found.

## 2020-03-17 ENCOUNTER — Inpatient Hospital Stay (HOSPITAL_COMMUNITY): Payer: Medicare PPO

## 2020-03-17 ENCOUNTER — Ambulatory Visit (HOSPITAL_COMMUNITY): Payer: Medicare PPO

## 2020-03-17 DIAGNOSIS — R1310 Dysphagia, unspecified: Secondary | ICD-10-CM | POA: Diagnosis not present

## 2020-03-17 DIAGNOSIS — C799 Secondary malignant neoplasm of unspecified site: Secondary | ICD-10-CM | POA: Diagnosis not present

## 2020-03-17 DIAGNOSIS — K222 Esophageal obstruction: Secondary | ICD-10-CM | POA: Diagnosis not present

## 2020-03-17 DIAGNOSIS — J9601 Acute respiratory failure with hypoxia: Secondary | ICD-10-CM

## 2020-03-17 DIAGNOSIS — R1013 Epigastric pain: Secondary | ICD-10-CM

## 2020-03-17 LAB — BASIC METABOLIC PANEL
Anion gap: 10 (ref 5–15)
BUN: <5 mg/dL — ABNORMAL LOW (ref 8–23)
CO2: 25 mmol/L (ref 22–32)
Calcium: 7.9 mg/dL — ABNORMAL LOW (ref 8.9–10.3)
Chloride: 100 mmol/L (ref 98–111)
Creatinine, Ser: 0.57 mg/dL (ref 0.44–1.00)
GFR calc Af Amer: >60 mL/min (ref >60)
GFR calc non Af Amer: >60 mL/min (ref >60)
Glucose, Bld: 118 mg/dL — ABNORMAL HIGH (ref 70–99)
Potassium: 3.7 mmol/L (ref 3.5–5.1)
Sodium: 135 mmol/L (ref 135–145)

## 2020-03-17 LAB — CBC
HCT: 27.8 % — ABNORMAL LOW (ref 36.0–46.0)
Hemoglobin: 8.9 g/dL — ABNORMAL LOW (ref 12.0–15.0)
MCH: 31.7 pg (ref 26.0–34.0)
MCHC: 32 g/dL (ref 30.0–36.0)
MCV: 98.9 fL (ref 80.0–100.0)
Platelets: 344 10*3/uL (ref 150–400)
RBC: 2.81 MIL/uL — ABNORMAL LOW (ref 3.87–5.11)
RDW: 17.1 % — ABNORMAL HIGH (ref 11.5–15.5)
WBC: 7.5 10*3/uL (ref 4.0–10.5)
nRBC: 0.5 % — ABNORMAL HIGH (ref 0.0–0.2)

## 2020-03-17 MED ORDER — FUROSEMIDE 10 MG/ML IJ SOLN
40.0000 mg | Freq: Once | INTRAMUSCULAR | Status: AC
  Administered 2020-03-17: 40 mg via INTRAVENOUS
  Filled 2020-03-17: qty 4

## 2020-03-17 MED ORDER — MEGESTROL ACETATE 400 MG/10ML PO SUSP
400.0000 mg | Freq: Every day | ORAL | Status: DC
  Filled 2020-03-17 (×2): qty 10

## 2020-03-17 MED ORDER — PANTOPRAZOLE SODIUM 40 MG PO TBEC
40.0000 mg | DELAYED_RELEASE_TABLET | Freq: Two times a day (BID) | ORAL | Status: DC
  Administered 2020-03-17 – 2020-03-18 (×3): 40 mg via ORAL
  Filled 2020-03-17 (×3): qty 1

## 2020-03-17 MED ORDER — HYDROMORPHONE HCL 1 MG/ML IJ SOLN
0.5000 mg | INTRAMUSCULAR | Status: DC | PRN
  Administered 2020-03-17 – 2020-03-18 (×8): 0.5 mg via INTRAVENOUS
  Filled 2020-03-17 (×8): qty 1

## 2020-03-17 MED ORDER — MEGESTROL ACETATE 40 MG PO TABS
40.0000 mg | ORAL_TABLET | Freq: Every day | ORAL | Status: DC
  Filled 2020-03-17: qty 1

## 2020-03-17 MED ORDER — SODIUM CHLORIDE 0.9 % IV SOLN: INTRAVENOUS | Status: DC

## 2020-03-17 MED ORDER — HYDROMORPHONE HCL 2 MG PO TABS
2.0000 mg | ORAL_TABLET | Freq: Three times a day (TID) | ORAL | Status: DC
  Administered 2020-03-17 (×2): 2 mg via ORAL
  Filled 2020-03-17 (×3): qty 1

## 2020-03-17 MED ORDER — POTASSIUM CHLORIDE 20 MEQ/15ML (10%) PO SOLN
40.0000 meq | Freq: Once | ORAL | Status: AC
  Administered 2020-03-17: 40 meq via ORAL
  Filled 2020-03-17: qty 30

## 2020-03-17 NOTE — Plan of Care (Signed)
°  Problem: Coping: °Goal: Level of anxiety will decrease °Outcome: Progressing °  °

## 2020-03-17 NOTE — Plan of Care (Signed)
Patient Alert and Oriented. Daughter at bedside today. Patient refused repositioning, but floated heels and knees. Scd's on. PRN pain and scheduled meds given. PO meds crushed in applesaice. Patient resting in bed at this time.   Problem: Education: Goal: Knowledge of General Education information will improve Description: Including pain rating scale, medication(s)/side effects and non-pharmacologic comfort measures Outcome: Progressing   Problem: Health Behavior/Discharge Planning: Goal: Ability to manage health-related needs will improve Outcome: Progressing   Problem: Clinical Measurements: Goal: Ability to maintain clinical measurements within normal limits will improve Outcome: Progressing Goal: Will remain free from infection Outcome: Progressing Goal: Diagnostic test results will improve Outcome: Progressing Goal: Respiratory complications will improve Outcome: Progressing Goal: Cardiovascular complication will be avoided Outcome: Progressing   Problem: Activity: Goal: Risk for activity intolerance will decrease Outcome: Progressing   Problem: Nutrition: Goal: Adequate nutrition will be maintained Outcome: Progressing   Problem: Coping: Goal: Level of anxiety will decrease Outcome: Progressing   Problem: Elimination: Goal: Will not experience complications related to bowel motility Outcome: Progressing Goal: Will not experience complications related to urinary retention Outcome: Progressing   Problem: Pain Managment: Goal: General experience of comfort will improve Outcome: Progressing   Problem: Safety: Goal: Ability to remain free from injury will improve Outcome: Progressing   Problem: Skin Integrity: Goal: Risk for impaired skin integrity will decrease Outcome: Progressing

## 2020-03-17 NOTE — TOC Progression Note (Signed)
Transition of Care [facility]) - Progression Note    Patient Details  Name: Lindsey Mullins MRN: [id number] Date of Birth: 17-May-1946  Transition of Care [facility]) CM/SW Contact  Yves Fodor, Edson Snowball, RN Phone Number: 03/17/2020, 12:54 PM  Clinical Narrative:     Damaris Schooner to patient and daughter [name] at bedside. Portia called Emergency planning/management officer and placed on speaker phone.   Walker was delivered to patient's room. [facility] called [name] to arrange delivery , however she had questions and did not set a delivery time/date. Questions answered NCM called Zac with [facility] [facility] and asked him to have [facility] call back for delivery. Discussed oxygen, MD does not feel she will need home oxygen.   At discharge daughters are requesting PTAR transport home to address in [place].   Discussed Palliative Care at home. Patient and daughters agreeable. Referral given to Audrea Muscat with [facility].   Expected Discharge Plan: [place] Barriers to Discharge: Continued Medical Work up  Expected Discharge Plan and Services Expected Discharge Plan: [place] In-house Referral: Clinical Social Work Discharge Planning Services: CM Consult Post Acute Care Choice: Home Health, Durable Medical Equipment Living arrangements for the past 2 months: [place]                 DME Arranged: Hospital bed, Walker rolling, Wheelchair manual DME Agency: [facility]       HH Arranged: PT, Nurse's Aide           Social Determinants of Health (SDOH) Interventions    Readmission Risk Interventions No flowsheet data found.

## 2020-03-17 NOTE — Progress Notes (Signed)
PROGRESS NOTE  Lindsey Mullins VZD:[id number] DOB: Jun 20, 1946 DOA: 03/08/2020 PCP: [name], MD   LOS: 8 days   Brief Narrative / Interim history: 74 year old female with history of metastatic lung cancer, HTN, hypothyroidism, initially presented to [facility] with generalized weakness, abdominal pain, FTT and poor p.o. intake.  She underwent an abdominal CT which did not show any perforation or bowel obstruction but did show increased size of hepatic metastatic disease.  Eventually GI was consulted and underwent an EGD on 4/16 which showed severe extrinsic stenosis of the esophagus.  After many discussions she was transferred to [facility] and eventually ended up having a palliative esophageal stent placed on 4/27.  Subjective / 24h Interval events: Complains of intermittent pain, also about the fact that pain medications are not lasting long enough.  She has been able to drink a little bit more, feels better while swallowing however her reflux and burping is increased significantly  Assessment & Plan: Principal Problem Metastatic lung cancer with severe extrinsic esophageal stenosis, failure to thrive / dysphagia-she is status post esophageal stent on 4/27.  Her symptoms seem to be improving in terms of ability to swallow however she continues to exhibit a very poor appetite and does not like the liquids that she is offered here.  Daughter is at bedside and she is concerned about overall nutritional status.  I will start Megace to see if it helps her appetite -Due to persistent pain I have also consulted palliative care, she was evaluated at [facility] prior to transfer here  Active Problems Hypokalemia/hypomagnesemia-potassium normal but will replete to aim for 4.0  Acute hypoxic respiratory failure-patient is found on oxygen this morning, apparently he dropped the 80s overnight.  She has received IV fluids for the past patient is up 10 L.  Stop IV fluids, chest  x-ray with bilateral pleural effusions) or edema.  I will give Lasix x1  Anemia of chronic disease/metastatic cancer-no bleeding, hemoglobin stable today  Hypothyroidism-continue Synthroid, TSH was found to be quite elevated and the Synthroid dose has been increased  Hypertension-as needed labetalol   Scheduled Meds: . Chlorhexidine Gluconate Cloth  6 each Topical Daily  . enoxaparin (LOVENOX) injection  40 mg Subcutaneous Q24H  . feeding supplement  1 Container Oral TID BM  . HYDROmorphone  2 mg Oral Q8H  . levothyroxine  75 mcg Oral QAC breakfast  . metoCLOPramide (REGLAN) injection  5 mg Intravenous Q6H  . naloxegol oxalate  25 mg Oral Daily  . pantoprazole  40 mg Oral BID  . polyethylene glycol  17 g Oral BID  . senna-docusate  1 tablet Oral BID   Continuous Infusions:  PRN Meds:.acetaminophen **OR** acetaminophen, alum & mag hydroxide-simeth, HYDROmorphone (DILAUDID) injection, labetalol, ondansetron (ZOFRAN) IV, polyethylene glycol, sodium chloride flush  DVT prophylaxis: Lovenox Code Status: Full code Family Communication: daughter at bedside  Patient admitted from: home Anticipated d/c place: TBD Barriers to d/c: Anticipate home discharge when p.o. intake improves, still liquid diet, advance per GI, palliative care also to see and evaluate  Consultants:  Gastroenterology  Procedures:  EGD/stent placement 4/27  Microbiology  None  Antimicrobials: None   Objective: Vitals:   03/16/20 2114 03/16/20 2131 03/16/20 2132 03/17/20 0536  BP: (!) 153/86   136/88  Pulse: 81   (!) 101  Resp: 17   15  Temp: 97.7 F (36.5 C)   97.8 F (36.6 C)  TempSrc: Oral   Oral  SpO2: 97% (!) 88%  100% 100%  Weight:      Height:        Intake/Output Summary (Last 24 hours) at 03/17/2020 1027 Last data filed at 03/17/2020 0536 Gross per 24 hour  Intake 1801.92 ml  Output 1150 ml  Net 651.92 ml   Filed Weights   03/08/20 1139 03/08/20 1815  Weight: 73.5 kg 74.3 kg     Examination:  Constitutional: Appears very weak but no apparent distress Eyes: No scleral icterus ENMT: Moist mucous membranes Neck: normal, supple Respiratory: Diminished at the bases, faint crackles heard.  No wheezing.  Overall normal respiratory effort Cardiovascular: Regular rate and rhythm, no murmurs, 1+ edema Abdomen: Soft, nontender, nondistended, bowel sounds positive Musculoskeletal: no clubbing / cyanosis.  Skin: No rash seen Neurologic: No focal deficits  Data Reviewed: I have independently reviewed following labs and imaging studies   CBC: Recent Labs  Lab 03/15/20 0448 03/16/20 0350 03/17/20 0405  WBC 7.2 7.7 7.5  HGB 8.8* 8.5* 8.9*  HCT 27.9* 26.9* 27.8*  MCV 98.2 97.1 98.9  PLT 338 314 712   Basic Metabolic Panel: Recent Labs  Lab 03/11/20 0628 03/13/20 0606 03/15/20 0448 03/16/20 0350 03/17/20 0405  NA 133* 134* 135 137 135  K 2.9* 3.7 3.4* 3.4* 3.7  CL 99 102 100 100 100  CO2 23 23 24 26 25   GLUCOSE 105* 100* 115* 123* 118*  BUN 6* <5* <5* <5* <5*  CREATININE 0.57 0.49 0.65 0.62 0.57  CALCIUM 8.0* 8.3* 8.2* 8.2* 7.9*  MG  --  1.7 1.7 1.6*  --   PHOS  --   --  3.1  --   --    Liver Function Tests: Recent Labs  Lab 03/15/20 0448 03/16/20 0350  AST 56* 51*  ALT 16 14  ALKPHOS 127* 114  BILITOT 0.8 0.7  PROT 6.1* 5.7*  ALBUMIN 2.4* 2.4*   Coagulation Profile: No results for input(s): INR, PROTIME in the last 168 hours. HbA1C: No results for input(s): HGBA1C in the last 72 hours. CBG: No results for input(s): GLUCAP in the last 168 hours.  Recent Results (from the past 240 hour(s))  SARS CORONAVIRUS 2 (TAT 6-24 HRS) Nasopharyngeal Urine, Clean Catch     Status: None   Collection Time: 03/08/20  2:14 PM   Specimen: Urine, Clean Catch; Nasopharyngeal  Result Value Ref Range Status   SARS Coronavirus 2 NEGATIVE NEGATIVE Final    Comment: (NOTE) SARS-CoV-2 target nucleic acids are NOT DETECTED. The SARS-CoV-2 RNA is generally  detectable in upper and lower respiratory specimens during the acute phase of infection. Negative results do not preclude SARS-CoV-2 infection, do not rule out co-infections with other pathogens, and should not be used as the sole basis for treatment or other patient management decisions. Negative results must be combined with clinical observations, patient history, and epidemiological information. The expected result is Negative. Fact Sheet for Patients: SugarRoll.be Fact Sheet for Healthcare Providers: https://www.woods-mathews.com/ This test is not yet approved or cleared by the [place] FDA and  has been authorized for detection and/or diagnosis of SARS-CoV-2 by FDA under an Emergency Use Authorization (EUA). This EUA will remain  in effect (meaning this test can be used) for the duration of the COVID-19 declaration under Section 56 4(b)(1) of the Act, 21 U.S.C. section 360bbb-3(b)(1), unless the authorization is terminated or revoked sooner. Performed at [facility], [place] [address] [place], [place] [address]      Radiology Studies: DG CHEST PORT 1 VIEW  Result Date: 03/17/2020 CLINICAL DATA:  Hypoxia. EXAM: PORTABLE CHEST 1 VIEW COMPARISON:  03/15/2020 FINDINGS: There is a left chest wall port a catheter with tip at the SVC. Esophageal stent appears unchanged in position. Bilateral pleural effusions are identified, right greater than left. Mild diffuse interstitial edema. IMPRESSION: Bilateral pleural effusions and mild interstitial edema. Unchanged from previous exam. Electronically Signed   By: Kerby Moors M.D.   On: 03/17/2020 09:47   Marzetta Board, MD, PhD [facility]  Between 7 am - 7 pm I am available, please contact me via Amion or Securechat  Between 7 pm - 7 am I am not available, please contact night coverage MD/APP via Amion

## 2020-03-17 NOTE — Progress Notes (Signed)
Daily Progress Note   Patient Name: Lindsey Mullins       Date: 03/17/2020 DOB: 1946-10-05  Age: 74 y.o. MRN#: [id number] Attending Physician: Caren Griffins, MD Primary Care Physician: [name], MD Admit Date: 03/08/2020  Reason for Consultation/Follow-up: Establishing goals of care and Pain control  Subjective: Patient c/o abd pain, poor appetite, wants to take a nap  Length of Stay: 8  Current Medications: Scheduled Meds:  . Chlorhexidine Gluconate Cloth  6 each Topical Daily  . enoxaparin (LOVENOX) injection  40 mg Subcutaneous Q24H  . feeding supplement  1 Container Oral TID BM  . HYDROmorphone  2 mg Oral Q8H  . levothyroxine  75 mcg Oral QAC breakfast  . megestrol  40 mg Oral Daily  . metoCLOPramide (REGLAN) injection  5 mg Intravenous Q6H  . naloxegol oxalate  25 mg Oral Daily  . pantoprazole  40 mg Oral BID  . polyethylene glycol  17 g Oral BID  . senna-docusate  1 tablet Oral BID    Continuous Infusions:   PRN Meds: acetaminophen **OR** acetaminophen, alum & mag hydroxide-simeth, HYDROmorphone (DILAUDID) injection, labetalol, ondansetron (ZOFRAN) IV, polyethylene glycol, sodium chloride flush  Physical Exam Constitutional:      General: She is not in acute distress. Cardiovascular:     Rate and Rhythm: Tachycardia present.  Pulmonary:     Effort: Pulmonary effort is normal. No respiratory distress.  Skin:    General: Skin is warm and dry.  Neurological:     Mental Status: She is alert.     Comments: Some confusion             Vital Signs: BP 136/88 (BP Location: Left Leg)   Pulse (!) 101   Temp 97.8 F (36.6 C) (Oral)   Resp 15   Ht 5\' 6"  (1.676 m)   Wt 74.3 kg   SpO2 100%   BMI 26.44 kg/m  SpO2: SpO2: 100 % O2 Device: O2 Device: Nasal Cannula O2  Flow Rate: O2 Flow Rate (L/min): 2 L/min  Intake/output summary:   Intake/Output Summary (Last 24 hours) at 03/17/2020 1425 Last data filed at 03/17/2020 1339 Gross per 24 hour  Intake 1801.92 ml  Output 2350 ml  Net -548.08 ml   LBM: Last BM Date: 03/11/20 Baseline Weight: Weight: 73.5 kg Most recent weight: Weight: 74.3 kg       Palliative Assessment/Data: PPS 20% d/t poor PO intake    Flowsheet Rows     Most Recent Value  Intake Tab  Referral Department  Hospitalist  Unit at Time of Referral  Med/Surg Unit  Palliative Care Primary Diagnosis  Cancer  Date Notified  03/08/20  Palliative Care Type  New Palliative care  Reason for referral  Clarify Goals of Care, Advance Care Planning  Date of Admission  03/08/20  Date first seen by Palliative Care  03/09/20  # of days Palliative referral response time  1 Day(s)  # of days IP prior to Palliative referral  0  Clinical Assessment  Palliative Performance Scale Score  30%  Pain Max last 24 hours  Not able to report  Pain Min Last 24 hours  Not able to report  Dyspnea Max Last 24 Hours  Not able to report  Dyspnea Min Last 24 hours  Not able to report  Psychosocial & Spiritual Assessment  Palliative Care Outcomes      Patient Active Problem List   Diagnosis Date Noted  . Esophageal stenosis   . Odynophagia   . Failure to thrive in adult 03/09/2020  . Palliative care by specialist   . Left low back pain   . Failure to thrive (0-17) 03/08/2020  . Hypothyroidism 01/26/2020  . Peripheral edema 09/11/2019  . Malignant neoplasm of right lung ([place])   . Goals of care, counseling/discussion 06/10/2019  . Squamous cell carcinoma of lung, stage IV, right ([place]) 05/28/2019  . Metastatic cancer ([place]) 05/26/2019  . Cervical lymphadenopathy   . Compression fracture of L2 ([place]) 04/29/2019  . Arteriosclerosis, mesenteric artery ([place]) 12/31/2018  . Abdominal pain 12/22/2018  . Colitis 12/22/2018  . Osteopenia   . DDD  (degenerative disc disease), lumbar 05/11/2016  . Obesity 05/11/2016  . Glucose intolerance (impaired glucose tolerance) 01/12/2016  . Pain in the chest   . Chest pain 01/10/2016  . GERD (gastroesophageal reflux disease) 01/26/2015  . Stress and adjustment reaction 01/26/2015  . Carotid artery disease ([place]) 04/20/2013  . Bunion, left 09/21/2012  . Overweight(278.02) 06/04/2012  . Essential hypertension, benign 04/02/2012  . Hyperlipidemia 04/02/2012  . Dysphagia 04/02/2012  . RLS (restless legs syndrome) 04/02/2012    Palliative Care Assessment & Plan   HPI: 74 y.o. female  with past medical history of metastatic lung cancer with burden to liver, spleen, and spine that is progressing despite chemotherapy, increase in size of hepatic metastatic disease, currently on immunotherapy, dysphagia from extrinsic esophageal stenosis, colitis, GERD, HTN/HLD, osteopenia, restless leg syndrome, TIA former smoker quit 36 years ago with a 2.5-pack-year history admitted on 03/08/2020 with failure to thrive due to metastatic lung cancer.   Assessment: F/u today with Ms. Nadeem at request of Dr. Cruzita Lederer. Family's biggest concern today is pain management. Patient was started on scheduled dilaudid this AM by Dr. Cruzita Lederer. Daughter [name] at bedside tells me that patient has not tolerated pain medicine well in the past and how surprised she is that patient has asked for medication. We discussed regimen currently ordered with scheduled PO meds and IV meds for breakthrough pain. Discussed attempting to find regimen so that patient does not require IV doses. [name] would like to see how patient does with scheduled medications and reevaluate tomorrow.   Patient is having difficulty with PO intake while I am at bedside - does not like any of the foods offered. Tells daughter she just wants to sleep.   Briefly discussed goals of care with daughter - reviewed goals of care conversation family had with my colleague  Quinn Axe, NP at [facility]- at that time family interested in all medical interventions offered - this continues to be true.   They have agreed to outpatient palliative following upon discharge.   Recommendations/Plan:  Agree with scheduled dilaudid - if continues to require IV doses of PRN medication would increase frequency of scheduled dilaudid - will reevaluate symptoms tomorrow  Family interested in all medical interventions offered/full scope care  Palliative to follow outpatient  Code Status:  Full code  Prognosis:   < 6 months would not be surprising based on advancing cancer, poor functional status, foor nutrition  Discharge Planning:  Home with Palliative Services and home health  Care plan was discussed with daughter and patient  Thank you  for allowing the Palliative Medicine Team to assist in the care of this patient.   Total Time 35 minutes Prolonged Time Billed  no       Greater than 50%  of this time was spent counseling and coordinating care related to the above assessment and plan.  Juel Burrow, DNP, [facility] Palliative Medicine Team Team Phone # [phone]  Pager [phone]

## 2020-03-17 NOTE — Progress Notes (Addendum)
Daily Rounding Note  03/17/2020, 9:40 AM  LOS: 8 days   SUBJECTIVE:   Chief complaint: Esophageal stricture from extrinsic compression, status post stenting. Although she is tolerating full liquids, her appetite remains poor.  This is due to anorexia as well as increasing reflux now that the stent is in place. No bowel movements for at least a couple of days, chronic constipation at home as well.  OBJECTIVE:         Vital signs in last 24 hours:    Temp:  [97.4 F (36.3 C)-97.8 F (36.6 C)] 97.8 F (36.6 C) (04/29 0536) Pulse Rate:  [81-101] 101 (04/29 0536) Resp:  [14-17] 15 (04/29 0536) BP: (136-176)/(76-98) 136/88 (04/29 0536) SpO2:  [88 %-100 %] 100 % (04/29 0536) Last BM Date: 03/11/20 Filed Weights   03/08/20 1139 03/08/20 1815  Weight: 73.5 kg 74.3 kg   General: Looks terminal, exhausted Heart: RRR Chest: No labored breathing or cough. Abdomen: Soft, nontender, somewhat distended.  Bowel sounds hypoactive. Extremities: Anasarca. Neuro/Psych: Sleeping, arousable.  Follows commands intermittently.  Very few words.  Intake/Output from previous day: 04/28 0701 - 04/29 0700 In: 2186.9 [P.O.:340; I.V.:1646.9; IV Piggyback:200] Out: 1150 [Urine:1150]  Intake/Output this shift: No intake/output data recorded.  Lab Results: Recent Labs    03/15/20 0448 03/16/20 0350 03/17/20 0405  WBC 7.2 7.7 7.5  HGB 8.8* 8.5* 8.9*  HCT 27.9* 26.9* 27.8*  PLT 338 314 344   BMET Recent Labs    03/15/20 0448 03/16/20 0350 03/17/20 0405  NA 135 137 135  K 3.4* 3.4* 3.7  CL 100 100 100  CO2 24 26 25   GLUCOSE 115* 123* 118*  BUN <5* <5* <5*  CREATININE 0.65 0.62 0.57  CALCIUM 8.2* 8.2* 7.9*   LFT Recent Labs    03/15/20 0448 03/16/20 0350  PROT 6.1* 5.7*  ALBUMIN 2.4* 2.4*  AST 56* 51*  ALT 16 14  ALKPHOS 127* 114  BILITOT 0.8 0.7   PT/INR No results for input(s): LABPROT, INR in the last 72  hours. Hepatitis Panel No results for input(s): HEPBSAG, HCVAB, HEPAIGM, HEPBIGM in the last 72 hours.  Studies/Results: DG Neck Soft Tissue  Result Date: 03/15/2020 CLINICAL DATA:  74 year old female with chest pain and crepitus after EGD and esophageal stent placement. EXAM: NECK SOFT TISSUES - 1+ VIEW COMPARISON:  Neck radiographs 03/10/2008. FINDINGS: Portable lateral view of the neck. Normal prevertebral soft tissue contour. Hypopharynx and epiglottis contours appear normal. No abnormal soft tissue gas identified on this lateral view. Absent dentition. Mild for age cervical disc and endplate degeneration. No acute osseous abnormality identified. Evidence of calcified carotid atherosclerosis at the bifurcations. IMPRESSION: No subcutaneous gas or acute findings identified on portable lateral view of the neck. Electronically Signed   By: Genevie Ann M.D.   On: 03/15/2020 14:50   DG CHEST PORT 1 VIEW  Result Date: 03/15/2020 CLINICAL DATA:  Post esophageal stent placement. EXAM: PORTABLE CHEST 1 VIEW COMPARISON:  03/06/2020 FINDINGS: Left subclavian Port-A-Cath unchanged with tip over the SVC. Interval placement of esophageal stent over the mid to lower esophagus appears in adequate position. Lungs are adequately inflated demonstrate slight interval worsening of opacification over the right mid to lower lung likely small to moderate effusion with associated basilar atelectasis. Possible small amount of left basilar pleural fluid/atelectasis. Mild hazy prominence of the perihilar vessels suggesting mild vascular congestion. Cardiomediastinal silhouette and remainder of the exam is unchanged. IMPRESSION: 1. Findings  suggesting minimal vascular congestion with slight worsening opacification over the right mid to lower lung likely effusion with associated atelectasis. Minimal left base opacification likely effusion/atelectasis. 2. Interval placement of esophageal stent over the mid to lower esophagus. Left  subclavian Port-A-Cath unchanged. Electronically Signed   By: Marin Olp M.D.   On: 03/15/2020 14:46   DG Abd Portable 1V  Result Date: 03/15/2020 CLINICAL DATA:  Esophageal stent placement EXAM: PORTABLE ABDOMEN - 1 VIEW COMPARISON:  03/06/2020 FINDINGS: Partially visualized stent material at the level of the GE junction. The bowel gas pattern is nonobstructive. No radio-opaque calculi or other significant radiographic abnormality are seen. IMPRESSION: 1. Partially visualized stent material at the level of the GE junction. 2. Nonobstructive bowel gas pattern. Electronically Signed   By: Davina Poke D.O.   On: 03/15/2020 14:47   DG Esophagus Dilatation  Result Date: 03/15/2020 ESOPHAGEAL DILATATION: Fluoroscopy was provided for use by the requesting physician.  No images were obtained for radiographic interpretation.  Scheduled Meds: . Chlorhexidine Gluconate Cloth  6 each Topical Daily  . enoxaparin (LOVENOX) injection  40 mg Subcutaneous Q24H  . feeding supplement  1 Container Oral TID BM  . levothyroxine  75 mcg Oral QAC breakfast  . metoCLOPramide (REGLAN) injection  5 mg Intravenous Q6H  . naloxegol oxalate  25 mg Oral Daily  . pantoprazole (PROTONIX) IV  40 mg Intravenous Q24H  . polyethylene glycol  17 g Oral BID  . senna-docusate  1 tablet Oral BID   Continuous Infusions: PRN Meds:.acetaminophen **OR** acetaminophen, alum & mag hydroxide-simeth, HYDROmorphone (DILAUDID) injection, labetalol, ondansetron (ZOFRAN) IV, polyethylene glycol, sodium chloride flush   ASSESMENT:   *  Dysphagia. Improved but now with reflux, esoph/chest burning.   EGD by Dr. Laural Golden 03/04/2020.  Severe extrinsic stenosis, traversed with ultrasound slim scope. EGD 03/15/2020, extrinsic esophageal narrowing, stent placed. Clear liquid diet for 24 hours post procedure and advance to full liquids as tolerated.  Aggressive antiemetics with Zofran/Compazine/Phenergan to reduce risk of nausea  vomiting.  *   Anorexia, multifactorial not the least of which is her metastatic lung cancer, narcotics.  *    Metastatic lung cancer. Mets to liver, spleen, lymph nodes and suspected malignant ascites based on CT of 03/06/2020.   *    Chronic pain, uses Dilaudid oral at home.  *    Constipation.  On twice daily senna and twice daily Miralax but not having BM's for last 2 days.    *    Bilateral pleural effusions and mild edema per chest x-ray this morning.   PLAN   *   Consider adding Megace.  *    If anorexia and failure to meet nutritional goals persists despite her ability to swallow, she may need IR to place a feeding G-tube.  Would not pursue TNA with its risks of line infection, metabolic derangements.  *   Switch to oral PPI, up dose to bid.  ?  Which Carafate confer any benefit?  *    If family really wants her to meet nutritional goals so that she can proceed with chemotherapy, they may have to change their minds about placing gastric feeding tube to which they are currently opposed.  Even this may not be feasible given she has ascites.    Azucena Freed  03/17/2020, 9:40 AM Phone [phone]

## 2020-03-17 NOTE — Progress Notes (Signed)
[facility] ([facility])  Hospital Liaison: RN note         Notified by TOC manager of patient/family request for [facility] Palliative services at home after discharge.                  [facility] Palliative team will follow up with patient after discharge.         Please call with any hospice or palliative related questions.         Thank you for this referral.         Mary Anne Robertson, RN, CCM  [facility] Hospital Liaison (listed on [facility] under [facility]osp[facility])    [phone]   

## 2020-03-18 DIAGNOSIS — R0902 Hypoxemia: Secondary | ICD-10-CM

## 2020-03-18 LAB — MAGNESIUM: Magnesium: 1.7 mg/dL (ref 1.7–2.4)

## 2020-03-18 LAB — COMPREHENSIVE METABOLIC PANEL
ALT: 13 U/L (ref 0–44)
AST: 44 U/L — ABNORMAL HIGH (ref 15–41)
Albumin: 2.4 g/dL — ABNORMAL LOW (ref 3.5–5.0)
Alkaline Phosphatase: 110 U/L (ref 38–126)
Anion gap: 10 (ref 5–15)
BUN: <5 mg/dL — ABNORMAL LOW (ref 8–23)
CO2: 27 mmol/L (ref 22–32)
Calcium: 8.1 mg/dL — ABNORMAL LOW (ref 8.9–10.3)
Chloride: 96 mmol/L — ABNORMAL LOW (ref 98–111)
Creatinine, Ser: 0.55 mg/dL (ref 0.44–1.00)
GFR calc Af Amer: >60 mL/min (ref >60)
GFR calc non Af Amer: >60 mL/min (ref >60)
Glucose, Bld: 112 mg/dL — ABNORMAL HIGH (ref 70–99)
Potassium: 3.8 mmol/L (ref 3.5–5.1)
Sodium: 133 mmol/L — ABNORMAL LOW (ref 135–145)
Total Bilirubin: 0.8 mg/dL (ref 0.3–1.2)
Total Protein: 5.8 g/dL — ABNORMAL LOW (ref 6.5–8.1)

## 2020-03-18 LAB — CBC
HCT: 28.5 % — ABNORMAL LOW (ref 36.0–46.0)
Hemoglobin: 8.8 g/dL — ABNORMAL LOW (ref 12.0–15.0)
MCH: 30.6 pg (ref 26.0–34.0)
MCHC: 30.9 g/dL (ref 30.0–36.0)
MCV: 99 fL (ref 80.0–100.0)
Platelets: 333 10*3/uL (ref 150–400)
RBC: 2.88 MIL/uL — ABNORMAL LOW (ref 3.87–5.11)
RDW: 16.9 % — ABNORMAL HIGH (ref 11.5–15.5)
WBC: 8.9 10*3/uL (ref 4.0–10.5)
nRBC: 0.2 % (ref 0.0–0.2)

## 2020-03-18 LAB — PHOSPHORUS: Phosphorus: 2.9 mg/dL (ref 2.5–4.6)

## 2020-03-18 MED ORDER — METOCLOPRAMIDE HCL 5 MG PO TABS
5.0000 mg | ORAL_TABLET | Freq: Three times a day (TID) | ORAL | Status: DC
  Administered 2020-03-18 (×2): 5 mg via ORAL
  Filled 2020-03-18 (×2): qty 1

## 2020-03-18 MED ORDER — ONDANSETRON 4 MG PO TBDP: 4.0000 mg | ORAL_TABLET | Freq: Three times a day (TID) | ORAL | 0 refills | Status: AC | PRN

## 2020-03-18 MED ORDER — PANTOPRAZOLE SODIUM 40 MG PO TBEC: 40.0000 mg | DELAYED_RELEASE_TABLET | Freq: Two times a day (BID) | ORAL | 0 refills | Status: AC

## 2020-03-18 MED ORDER — HEPARIN SOD (PORK) LOCK FLUSH 100 UNIT/ML IV SOLN
500.0000 [IU] | INTRAVENOUS | Status: AC | PRN
  Administered 2020-03-18: 500 [IU]
  Filled 2020-03-18: qty 5

## 2020-03-18 MED ORDER — LEVOTHYROXINE SODIUM 75 MCG PO TABS: 75.0000 ug | ORAL_TABLET | Freq: Every day | ORAL | 0 refills | Status: AC

## 2020-03-18 MED ORDER — ALUM & MAG HYDROXIDE-SIMETH 200-200-20 MG/5ML PO SUSP: 30.0000 mL | ORAL | 0 refills | Status: AC | PRN

## 2020-03-18 MED ORDER — HYDROMORPHONE HCL 2 MG PO TABS
2.0000 mg | ORAL_TABLET | Freq: Four times a day (QID) | ORAL | Status: DC
  Administered 2020-03-18 (×2): 2 mg via ORAL
  Filled 2020-03-18 (×3): qty 1

## 2020-03-18 MED ORDER — HYDROMORPHONE HCL 1 MG/ML IJ SOLN: 1.0000 mg | Freq: Once | INTRAMUSCULAR | Status: DC

## 2020-03-18 MED ORDER — MEGESTROL ACETATE 400 MG/10ML PO SUSP: 400.0000 mg | Freq: Every day | ORAL | 0 refills | Status: DC

## 2020-03-18 MED ORDER — HYDROMORPHONE HCL 2 MG PO TABS: 1.0000 mg | ORAL_TABLET | ORAL | 0 refills | Status: AC | PRN

## 2020-03-18 MED ORDER — FUROSEMIDE 10 MG/ML IJ SOLN
40.0000 mg | Freq: Once | INTRAMUSCULAR | Status: AC
  Administered 2020-03-18: 40 mg via INTRAVENOUS
  Filled 2020-03-18: qty 4

## 2020-03-18 NOTE — TOC Progression Note (Addendum)
Transition of Care [facility]) - Progression Note    Patient Details  Name: Lindsey Mullins MRN: [id number] Date of Birth: [birth year]  [facility] CM/SW Contact  Jacalyn Lefevre Edson Snowball, RN Phone Number: 03/18/2020, 10:17 AM  Clinical Narrative:     Patient possible discharge today. Will need oxygen at home. Called Zac with  [employer] to add oxygen.   Per [name] [employer] to deliver wheel chair, 3 in 1 and hospital bed today. Zac aware discharge is today and patient will need PTAR home.    Messaged Tiffany with [facility] regarding HHPT, and aide. Awaiting call back.   [name] will take walker at bedside home with her. ([employer] delivered walker to bedside.)   PTAR papers completed and placed in shadow chart  Shertia called NCM. [employer] called her to deliver DME, she requested last delivery of the day. Phyllis Ginger was told delivery would be this evening. If patient discharged today she will call her sister [name] who is at bedside when DME is in place. At that point bedside nurse can call [facility] , patient placed on will call. PTAR open until 10 pm, after that their phones roll over to [facility] . Once DME delivered nurse will call PTAR  Expected Discharge Plan: [place] Barriers to Discharge: Continued Medical Work up  Expected Discharge Plan and Services Expected Discharge Plan: [address] In-house Referral: Clinical Social Work Discharge Planning Services: CM Consult Post Acute Care Choice: Home Health, Durable Medical Equipment Living arrangements for the past 2 months: [place]                 DME Arranged: Walker rolling, Wheelchair manual, Hospital bed, Oxygen DME Agency: [employer] Date DME Agency Contacted: 03/18/20 Time DME Agency Contacted: 0900 Representative spoke with at DME Agency: Zac Fallon: PT, Nurse's Aide [place] Agency: [facility] [employer] [facility] [employer] Date [place]: 03/18/20 Time [place]: 62 Representative spoke with at [place]: [place] Interventions    Readmission Risk Interventions No flowsheet data found.

## 2020-03-18 NOTE — Discharge Summary (Signed)
Physician Discharge Summary  FILIPPA YARBOUGH YIA:[id number] DOB: 1946-04-27 DOA: 03/08/2020  PCP: [name], MD  Admit date: 03/08/2020 Discharge date: 03/18/2020  Admitted From: home Disposition:  Home with palliative  Recommendations for Outpatient Follow-up:  1. Follow up with PCP in 1-2 weeks 2. Follow up with GI/Oncology as an outpatient  Home Health: PT Equipment/Devices: none  Discharge Condition: stable CODE STATUS: Full code Diet recommendation: soft  HPI: Per admitting MD, Lindsey Mullins is a 74 y.o. female with medical history significant for metastatic lung cancer, hypertension hypothyroidism.  Patient was sent to the ED from the cancer center with reports of generalized weakness, abdominal and back pain and constipation.  Patient was in the ED 2 days ago 4/18 for similar problems, abdominal CT was without evidence of perforation or bowel obstruction, but showed increase in size of hepatic metastatic disease, showed moderate stool burden in ascending and proximal transverse colon.  She was discharged home with a bowel regimen to include magnesium citrate. Patient also called her oncologist, and was prescribed Movantik.  Patient last bowel movement was this morning, which was a small amount. Daughter at bedside who helps with the history and who takes care of patient sudden decline, patient only able to take very thin liquids, plain Ensure.  Unable to walk without assistance.  Generally weak.  Minimal exertion results in difficulty breathing and wheezing.  Any minimal movement aggravates the pain in her back. Daughter, [name] at bedside is the only caregiver for now. ED Course: Stable vitals.  Sodium 131, UA rare bacteria 6-10 WBCs.  Soapsuds enema given with results-moderate amount of soft stool.  Hospitalist admit for failure to thrive, and that patient may require placement.  Hospital Course / Discharge diagnoses: Principal Problem Metastatic lung cancer with  severe extrinsic esophageal stenosis, failure to thrive / dysphagia-patient was admitted to the hospital with dysphagia and difficulty swallowing in the setting of severe extrinsic esophageal stenosis.  She was evaluated by gastroenterology and eventually underwent an EGD on 4/27 with esophageal stent placement.  She was monitored following the stent placement, gradually improving, her pain seems to be better and fairly well controlled on oral agents, she is able to eat more and her appetite is picking up.  She is feeling stronger, asking to go home, and will be discharged in stable condition.  She was also started on PPI.  Palliative care was also consulted and followed patient while hospitalized, she will have outpatient follow-up with palliative.  Active Problems Hypokalemia/hypomagnesemia-repleted and now improved Acute hypoxic respiratory failure-suspect a chronic component as well given metastatic lung cancer, she will be discharged on 2 L nasal cannula. Anemia of chronic disease/metastatic cancer-no bleeding Hypothyroidism-continue Synthroid, TSH was found to be quite elevated and the Synthroid dose has been increased Hypertension-resume home meds  Discharge Instructions   Allergies as of 03/18/2020      Reactions   Penicillins Shortness Of Breath, Other (See Comments)   Has patient had a PCN reaction causing immediate rash, facial/tongue/throat swelling, SOB or lightheadedness with hypotension: Yes Has patient had a PCN reaction causing severe rash involving mucus membranes or skin necrosis: Unk Has patient had a PCN reaction that required hospitalization: Unk Has patient had a PCN reaction occurring within the last 10 years: Yes "States that she was in ICU after being administered"   Codeine Nausea And Vomiting   Lipitor [atorvastatin] Other (See Comments)   Myalgias   Lisinopril Cough   Pravastatin Other (See Comments)  Myalgias      Medication List    STOP taking these  medications   ciprofloxacin 250 MG tablet Commonly known as: Cipro   omeprazole 20 MG capsule Commonly known as: PRILOSEC     TAKE these medications   alum & mag hydroxide-simeth 200-200-20 MG/5ML suspension Commonly known as: MAALOX/MYLANTA Take 30 mLs by mouth every 4 (four) hours as needed for indigestion.   HYDROmorphone 2 MG tablet Commonly known as: DILAUDID Take 0.5 tablets (1 mg total) by mouth every 4 (four) hours as needed for severe pain.   levothyroxine 75 MCG tablet Commonly known as: SYNTHROID Take 1 tablet (75 mcg total) by mouth daily before breakfast. Start taking on: Mar 19, 2020 What changed:   medication strength  how much to take   losartan 50 MG tablet Commonly known as: COZAAR Take 25 mg by mouth daily.   naloxegol oxalate 25 MG Tabs tablet Commonly known as: Movantik Take 1 tablet (25 mg total) by mouth daily.   nitroGLYCERIN 0.4 MG SL tablet Commonly known as: Nitrostat DISSOLVE ONE TABLET UNDER THE TONGUE AS NEEDED FOR CHEST PAIN**MAX 3 TABLETS EACH 5 MINUTES APART**   ondansetron 4 MG disintegrating tablet Commonly known as: Zofran ODT Take 1 tablet (4 mg total) by mouth every 8 (eight) hours as needed for nausea or vomiting.   pantoprazole 40 MG tablet Commonly known as: PROTONIX Take 1 tablet (40 mg total) by mouth 2 (two) times daily.   rosuvastatin 20 MG tablet Commonly known as: CRESTOR Take 20 mg by mouth daily.            Durable Medical Equipment  (From admission, onward)         Start     Ordered   03/18/20 0857  For home use only DME oxygen  Once    Question Answer Comment  Length of Need Lifetime   Mode or (Route) Nasal cannula   Liters per Minute 2   Frequency Continuous (stationary and portable oxygen unit needed)   Oxygen delivery system Gas      03/18/20 0856   03/16/20 1628  For home use only DME lightweight manual wheelchair with seat cushion  Once    Comments: Patient suffers from Metastatic lung  cancer with severe extrinsic esophageal stenosis, failure to thrive / dysphagia which impairs their ability to perform daily activities like ambulating   in the home.  A cane will not resolve  issue with performing activities of daily living. A wheelchair will allow patient to safely perform daily activities. Patient is not able to propel themselves in the home using a standard weight wheelchair due to Metastatic lung cancer with severe extrinsic esophageal stenosis, failure to thrive / dysphagia. Patient can self propel in the lightweight wheelchair. Length of need lifetime . Accessories: elevating leg rests (ELRs), wheel locks, extensions and anti-tippers.  Seat and back cushion   03/16/20 1628   03/16/20 1626  For home use only DME Walker rolling  Once    Question Answer Comment  Walker: With [place]   Patient needs a walker to treat with the following condition Weakness      03/16/20 1628   03/16/20 1626  For home use only DME Hospital bed  Once    Comments: HT 5'6", wt 74.3 kg   Called daughter Cerena Baine [phone] or daughter Griffin Dakin [phone] for delivery  Question Answer Comment  Length of Need Lifetime   Patient has (list  medical condition): Metastatic lung cancer with severe extrinsic esophageal stenosis, failure to thrive / dysphagia   The above medical condition requires: Patient requires the ability to reposition frequently   Head must be elevated greater than: 45 degrees   Bed type Semi-electric   Support Surface: Gel Overlay      03/16/20 1628         Follow-up Information    Derek Jack, MD. Schedule an appointment as soon as possible for a visit in 1 week(s).   Specialty: Hematology Contact information: [address] [place] [address] [phone]           Consultations:  GI  Palliative   Procedures/Studies:  EGD  DG Neck Soft Tissue  Result Date: 03/15/2020 CLINICAL DATA:  74 year old female with chest  pain and crepitus after EGD and esophageal stent placement. EXAM: NECK SOFT TISSUES - 1+ VIEW COMPARISON:  Neck radiographs 03/10/2008. FINDINGS: Portable lateral view of the neck. Normal prevertebral soft tissue contour. Hypopharynx and epiglottis contours appear normal. No abnormal soft tissue gas identified on this lateral view. Absent dentition. Mild for age cervical disc and endplate degeneration. No acute osseous abnormality identified. Evidence of calcified carotid atherosclerosis at the bifurcations. IMPRESSION: No subcutaneous gas or acute findings identified on portable lateral view of the neck. Electronically Signed   By: Genevie Ann M.D.   On: 03/15/2020 14:50   CT CHEST W CONTRAST  Result Date: 03/04/2020 CLINICAL DATA:  Non-small cell lung cancer, post first-line therapy, assessment for treatment response. EXAM: CT CHEST WITH CONTRAST TECHNIQUE: Multidetector CT imaging of the chest was performed during intravenous contrast administration. CONTRAST:  18mL OMNIPAQUE IOHEXOL 300 MG/ML  SOLN COMPARISON:  10/26/2019 FINDINGS: Cardiovascular: Coronary, aortic arch, and branch vessel atherosclerotic vascular disease. Moderate pericardial effusion, primarily accumulated posteriorly, new compared to prior. Mediastinum/Nodes: Compared to prior chest CT, there is significantly worsened bilateral neck level IV adenopathy, left subpectoral adenopathy, prevascular adenopathy, AP window adenopathy, right internal mammary, bilateral hilar adenopathy, paratracheal adenopathy, and subcarinal adenopathy. Index AP window lymph node 1.4 cm in short axis on image 58/2, previously 0.4 cm. Index prevascular node 1.2 cm in short axis on image 50/2, new. Index left level IV lymph node 2.1 cm in short axis on image 18/2, new. Index subpectoral lymph node on the left, 1.1 cm on image 33/2, previously 0.5 cm. Index left hilar node 1.6 cm in short axis on image 72/2. The subcarinal adenopathy is difficult to separate from the  esophagus. This measures about 2.3 cm in short axis on image 73/2 (formerly 1.0 cm), and invasion of the esophageal wall is not excluded given the lack of fat planes between this somewhat irregular adenopathy and the esophagus. An underlying esophageal mass is not excluded. Esophagus above this level is mildly dilated by fluid, but at this level adopts only soft tissue density. This extends along an approximately 5.7 cm segment of the mid to distal esophagus. Lungs/Pleura: Small to moderate right pleural effusion nonspecific for transudative, exudative, or malignant etiology. Mild interstitial accentuation bilaterally with secondary pulmonary lobular thickening and airway thickening. In the right lower lobe, there is an irregular band of density much of which is likely volume loss although underlying tumor is difficult to confidently exclude in this setting. This extends to the infrahilar adenopathy. Calcified pleural plaque on the left. Mild paraseptal emphysema. New 5 mm right upper lobe pulmonary nodule on image 39/4. New 5 by 4 mm right middle lobe pulmonary nodule on image 112/4. Upper  Abdomen: Considerable upper abdominal ascites. New and enlarging hepatic metastatic lesions including a new 1.0 cm hypodense lesion posteriorly in the right hepatic lobe on image 118/2. A hypodense lesion in the right hepatic lobe on image 126/2 measures 1.8 by 1.8 cm, previously about 0.8 cm in diameter. A hypodense lesion in segment 4 of the liver previously poorly seen measures 1.2 by 1.0 cm on image 143/2. A complex splenic lesion favoring metastatic lesion has enlarged, currently 3.0 by 2.2 cm, previously 1.3 by 1.1 cm. Nonspecific fullness of the visualized portion of the left adrenal gland appears increased from 10/26/2019 early adrenal metastatic lesion not excluded. Mildly prominent right gastric lymph nodes are present. Musculoskeletal: Scattered sclerotic lesions in the thoracic spine are present, most strikingly at  the T5 level as on the prior MRI from 01/15/2020. MRI better depicts these lesions superior to CT. There is a faint sclerotic lesion in the sternal body measuring 1.1 cm craniocaudad on image 100/6. IMPRESSION: 1. Significant progression of adenopathy especially in the bilateral neck level IV, left subpectoral, bilateral hilar, paratracheal, subcarinal, and subcarinal regions. Possible invasion of the esophageal wall by tumor in the mid to distal esophagus. 2. Small to moderate right pleural effusion nonspecific for transudative, exudative, or malignant etiology. 3. New and enlarging hepatic and splenic metastatic lesions. 4. New 5 mm right upper lobe and 5 by 4 mm right middle lobe pulmonary nodules, nonspecific for metastatic lesions. 5. Considerable upper abdominal ascites. 6. Coronary, aortic arch, and branch vessel atherosclerotic vascular disease. 7. Calcified pleural plaque on the left, query prior asbestos exposure. 8. Aortic atherosclerosis. 9. Nonspecific fullness of the visualized portion of the left adrenal gland appears increased from 10/26/2019 early adrenal metastatic lesion not excluded. 10. Scattered sclerotic lesions in the thoracic spine, most strikingly at the T5 level as on the prior MRI from 01/15/2020. MRI better depicts these lesions. Aortic Atherosclerosis (ICD10-I70.0) and Emphysema (ICD10-J43.9). Electronically Signed   By: Van Clines M.D.   On: 03/04/2020 18:44   CT ABDOMEN PELVIS W CONTRAST  Result Date: 03/06/2020 CLINICAL DATA:  Abdominal distension. Status post EGD 2 days ago. Pain. EXAM: CT ABDOMEN AND PELVIS WITH CONTRAST TECHNIQUE: Multidetector CT imaging of the abdomen and pelvis was performed using the standard protocol following bolus administration of intravenous contrast. CONTRAST:  16mL OMNIPAQUE IOHEXOL 300 MG/ML  SOLN COMPARISON:  Chest CT 2 days ago 03/04/2020. Abdominal CT 10/26/2019 FINDINGS: Lower chest: Right pleural effusion is unchanged from recent CT.  Bandlike consolidation in the right lower lobe is unchanged. Heart is normal in size. There are coronary artery calcifications. Small pericardial effusion measuring up to 8 mm. Distal esophageal wall thickening. No paraesophageal stranding. No extraluminal air. Hepatobiliary: Multiple hypodense hepatic lesions consistent with metastatic disease. Largest lesion in the right lobe measures 18 mm. Distended gallbladder without calcified gallstone. Common bile duct is not well-defined, no evidence of biliary dilatation. Pancreas: Parenchymal atrophy. No ductal dilatation or inflammation. Spleen: Irregular hypodense lesion in the inferior spleen measures 2.5 cm, increased in size from prior. No splenomegaly. Adrenals/Urinary Tract: Normal right adrenal gland. Minimal left adrenal thickening without dominant nodule. No hydronephrosis or perinephric edema. Homogeneous renal enhancement with symmetric excretion on delayed phase imaging. Scattered cortical scarring in the right kidney. Urinary bladder is physiologically distended without wall thickening. Mild high density of the urine in the urinary bladder may be related to some residual excretion of IV contrast from recent chest CT. Stomach/Bowel: Mild wall thickening of the distal esophagus. No adjacent  inflammation or extraluminal air. The stomach is nondistended. No small bowel obstruction, small bowel is decompressed. The appendix is not definitively visualized, no evidence of appendicitis. Large volume of stool in the ascending and proximal transverse colon. Transverse colon is tortuous. Distal transverse and descending colon are decompressed. No colonic wall thickening. Vascular/Lymphatic: Moderate aorto bi-iliac atherosclerosis. Portal caval adenopathy, including 11 mm node series 2, image 32, new from prior abdominal CT. Increased number of multiple small lower retroperitoneal nodes, largest measures 8 mm, nonspecific. 8 mm right external iliac/inguinal node is  nonspecific. Portal vein is patent. Reproductive: Multiple calcified uterine fibroids. Ascites limits assessment for adnexal mass. Ovaries tentatively visualized and quiescent. Other: Moderate volume abdominopelvic ascites. There is omental caking in the left upper quadrant and anterior abdomen most prominent just below the umbilicus, series 2, image 61. No free air. Musculoskeletal: Sclerotic osseous metastatic disease throughout the lumbar spine. Chronic compression deformity superior endplate of L2. Prominent Schmorl's node inferior endplate of L3. Patchy sclerosis involving the left proximal femur and bony pelvis. IMPRESSION: 1. No evidence of perforation post EGD.  No bowel obstruction. 2. Moderate volume abdominopelvic ascites. There is omental caking in the left upper quadrant and anterior abdomen. This is new from in December 2020 abdominal CT. 3. Increased size of hepatic metastatic disease. Increased size of hypodense splenic lesion suspicious for metastasis. Increased size of periportal and retroperitoneal nodes, thickening of the left adrenal gland, nonspecific but increased from prior exam. 4. Sclerotic osseous metastatic disease throughout the lumbar spine and bony pelvis. 5. Moderate stool in the ascending and proximal transverse colon. 6. Right pleural effusion is unchanged from recent chest CT 2 days ago. Bandlike consolidation in the right lower lobe is unchanged. Aortic Atherosclerosis (ICD10-I70.0). Electronically Signed   By: Keith Rake M.D.   On: 03/06/2020 22:54   DG CHEST PORT 1 VIEW  Result Date: 03/17/2020 CLINICAL DATA:  Hypoxia. EXAM: PORTABLE CHEST 1 VIEW COMPARISON:  03/15/2020 FINDINGS: There is a left chest wall port a catheter with tip at the SVC. Esophageal stent appears unchanged in position. Bilateral pleural effusions are identified, right greater than left. Mild diffuse interstitial edema. IMPRESSION: Bilateral pleural effusions and mild interstitial edema. Unchanged  from previous exam. Electronically Signed   By: Kerby Moors M.D.   On: 03/17/2020 09:47   DG CHEST PORT 1 VIEW  Result Date: 03/15/2020 CLINICAL DATA:  Post esophageal stent placement. EXAM: PORTABLE CHEST 1 VIEW COMPARISON:  03/06/2020 FINDINGS: Left subclavian Port-A-Cath unchanged with tip over the SVC. Interval placement of esophageal stent over the mid to lower esophagus appears in adequate position. Lungs are adequately inflated demonstrate slight interval worsening of opacification over the right mid to lower lung likely small to moderate effusion with associated basilar atelectasis. Possible small amount of left basilar pleural fluid/atelectasis. Mild hazy prominence of the perihilar vessels suggesting mild vascular congestion. Cardiomediastinal silhouette and remainder of the exam is unchanged. IMPRESSION: 1. Findings suggesting minimal vascular congestion with slight worsening opacification over the right mid to lower lung likely effusion with associated atelectasis. Minimal left base opacification likely effusion/atelectasis. 2. Interval placement of esophageal stent over the mid to lower esophagus. Left subclavian Port-A-Cath unchanged. Electronically Signed   By: Marin Olp M.D.   On: 03/15/2020 14:46   DG Chest Portable 1 View  Result Date: 03/06/2020 CLINICAL DATA:  Epigastric pain. EGD 2 days ago. Evaluate for free air under the diaphragm. EXAM: PORTABLE CHEST 1 VIEW COMPARISON:  Chest CT 2 days ago 03/04/2020  FINDINGS: No evidence of free air under the hemidiaphragms. No visualized pneumomediastinum. Left chest port remains in place, tip in the SVC. No pneumothorax. Pleural effusion and right basilar opacity similar to recent chest CT. Normal heart size with unchanged mediastinal contours. Aortic atherosclerosis. No pulmonary edema. IMPRESSION: 1. No evidence of free air under the hemidiaphragms. No visualized pneumomediastinum. 2. Right basilar opacity and pleural effusion similar to  recent chest CT. Electronically Signed   By: Keith Rake M.D.   On: 03/06/2020 21:27   DG Abd Portable 1V  Result Date: 03/15/2020 CLINICAL DATA:  Esophageal stent placement EXAM: PORTABLE ABDOMEN - 1 VIEW COMPARISON:  03/06/2020 FINDINGS: Partially visualized stent material at the level of the GE junction. The bowel gas pattern is nonobstructive. No radio-opaque calculi or other significant radiographic abnormality are seen. IMPRESSION: 1. Partially visualized stent material at the level of the GE junction. 2. Nonobstructive bowel gas pattern. Electronically Signed   By: Davina Poke D.O.   On: 03/15/2020 14:47   DG Abd Portable 1 View  Result Date: 03/06/2020 CLINICAL DATA:  Evaluate for free air. Status post EGD 2 days ago with epigastric pain. EXAM: PORTABLE ABDOMEN - 1 VIEW COMPARISON:  None. FINDINGS: Portable AP upright view of the abdomen. No free air under the hemidiaphragms. No bowel dilatation to suggest obstruction. Moderate stool in the right colon. IMPRESSION: No free air under the hemidiaphragms. Nonobstructive bowel gas pattern. Electronically Signed   By: Keith Rake M.D.   On: 03/06/2020 21:28   DG Esophagus Dilatation  Result Date: 03/15/2020 ESOPHAGEAL DILATATION: Fluoroscopy was provided for use by the requesting physician.  No images were obtained for radiographic interpretation.    Subjective: - no chest pain, shortness of breath, no abdominal pain, nausea or vomiting.   Discharge Exam: BP (!) 157/90 (BP Location: Right Arm)   Pulse 100   Temp 98.5 F (36.9 C) (Oral)   Resp 17   Ht 5\' 6"  (1.676 m)   Wt 74.3 kg   SpO2 97%   BMI 26.44 kg/m   General: Pt is alert, awake, not in acute distress Cardiovascular: RRR, S1/S2 +, no rubs, no gallops Respiratory: CTA bilaterally, no wheezing, no rhonchi Abdominal: Soft, NT, ND, bowel sounds + Extremities: no edema, no cyanosis   The results of significant diagnostics from this hospitalization (including  imaging, microbiology, ancillary and laboratory) are listed below for reference.     Microbiology: Recent Results (from the past 240 hour(s))  SARS CORONAVIRUS 2 (TAT 6-24 HRS) Nasopharyngeal Urine, Clean Catch     Status: None   Collection Time: 03/08/20  2:14 PM   Specimen: Urine, Clean Catch; Nasopharyngeal  Result Value Ref Range Status   SARS Coronavirus 2 NEGATIVE NEGATIVE Final    Comment: (NOTE) SARS-CoV-2 target nucleic acids are NOT DETECTED. The SARS-CoV-2 RNA is generally detectable in upper and lower respiratory specimens during the acute phase of infection. Negative results do not preclude SARS-CoV-2 infection, do not rule out co-infections with other pathogens, and should not be used as the sole basis for treatment or other patient management decisions. Negative results must be combined with clinical observations, patient history, and epidemiological information. The expected result is Negative. Fact Sheet for Patients: SugarRoll.be Fact Sheet for Healthcare Providers: https://www.woods-mathews.com/ This test is not yet approved or cleared by the [place] FDA and  has been authorized for detection and/or diagnosis of SARS-CoV-2 by FDA under an Emergency Use Authorization (EUA). This EUA will remain  in effect (meaning  this test can be used) for the duration of the COVID-19 declaration under Section 56 4(b)(1) of the Act, 21 U.S.C. section 360bbb-3(b)(1), unless the authorization is terminated or revoked sooner. Performed at [facility], [place] [address], [place], [place] [address]      Labs: Basic Metabolic Panel: Recent Labs  Lab 03/13/20 0606 03/15/20 0448 03/16/20 0350 03/17/20 0405 03/18/20 0451  NA 134* 135 137 135 133*  K 3.7 3.4* 3.4* 3.7 3.8  CL 102 100 100 100 96*  CO2 23 24 26 25 27   GLUCOSE 100* 115* 123* 118* 112*  BUN <5* <5* <5* <5* <5*  CREATININE 0.49 0.65 0.62 0.57 0.55  CALCIUM 8.3*  8.2* 8.2* 7.9* 8.1*  MG 1.7 1.7 1.6*  --  1.7  PHOS  --  3.1  --   --  2.9   Liver Function Tests: Recent Labs  Lab 03/15/20 0448 03/16/20 0350 03/18/20 0451  AST 56* 51* 44*  ALT 16 14 13   ALKPHOS 127* 114 110  BILITOT 0.8 0.7 0.8  PROT 6.1* 5.7* 5.8*  ALBUMIN 2.4* 2.4* 2.4*   CBC: Recent Labs  Lab 03/15/20 0448 03/16/20 0350 03/17/20 0405 03/18/20 0451  WBC 7.2 7.7 7.5 8.9  HGB 8.8* 8.5* 8.9* 8.8*  HCT 27.9* 26.9* 27.8* 28.5*  MCV 98.2 97.1 98.9 99.0  PLT 338 314 344 333   CBG: No results for input(s): GLUCAP in the last 168 hours. Hgb A1c No results for input(s): HGBA1C in the last 72 hours. Lipid Profile No results for input(s): CHOL, HDL, LDLCALC, TRIG, CHOLHDL, LDLDIRECT in the last 72 hours. Thyroid function studies No results for input(s): TSH, T4TOTAL, T3FREE, THYROIDAB in the last 72 hours.  Invalid input(s): FREET3 Urinalysis    Component Value Date/Time   COLORURINE YELLOW 03/08/2020 1415   APPEARANCEUR CLEAR 03/08/2020 1415   LABSPEC 1.028 03/08/2020 1415   PHURINE 5.0 03/08/2020 1415   GLUCOSEU NEGATIVE 03/08/2020 1415   HGBUR NEGATIVE 03/08/2020 1415   BILIRUBINUR NEGATIVE 03/08/2020 1415   KETONESUR 20 (A) 03/08/2020 1415   PROTEINUR 30 (A) 03/08/2020 1415   UROBILINOGEN 0.2 11/30/2013 0811   NITRITE NEGATIVE 03/08/2020 1415   LEUKOCYTESUR NEGATIVE 03/08/2020 1415    FURTHER DISCHARGE INSTRUCTIONS:   Get Medicines reviewed and adjusted: Please take all your medications with you for your next visit with your Primary MD   Laboratory/radiological data: Please request your Primary MD to go over all hospital tests and procedure/radiological results at the follow up, please ask your Primary MD to get all Hospital records sent to his/her office.   In some cases, they will be blood work, cultures and biopsy results pending at the time of your discharge. Please request that your primary care M.D. goes through all the records of your hospital  data and follows up on these results.   Also Note the following: If you experience worsening of your admission symptoms, develop shortness of breath, life threatening emergency, suicidal or homicidal thoughts you must seek medical attention immediately by calling 911 or calling your MD immediately  if symptoms less severe.   You must read complete instructions/literature along with all the possible adverse reactions/side effects for all the Medicines you take and that have been prescribed to you. Take any new Medicines after you have completely understood and accpet all the possible adverse reactions/side effects.    Do not drive when taking Pain medications or sleeping medications (Benzodaizepines)   Do not take more than prescribed Pain, Sleep  and Anxiety Medications. It is not advisable to combine anxiety,sleep and pain medications without talking with your primary care practitioner   Special Instructions: If you have smoked or chewed Tobacco  in the last 2 yrs please stop smoking, stop any regular Alcohol  and or any Recreational drug use.   Wear Seat belts while driving.   Please note: You were cared for by a hospitalist during your hospital stay. Once you are discharged, your primary care physician will handle any further medical issues. Please note that NO REFILLS for any discharge medications will be authorized once you are discharged, as it is imperative that you return to your primary care physician (or establish a relationship with a primary care physician if you do not have one) for your post hospital discharge needs so that they can reassess your need for medications and monitor your lab values.  Time coordinating discharge: 40 minutes  SIGNED:  Marzetta Board, MD, PhD 03/18/2020, 1:35 PM

## 2020-03-18 NOTE — Progress Notes (Signed)
PT Cancellation Note  Patient Details Name: Lindsey Mullins MRN: [id number] DOB: November 18, 1946   Cancelled Treatment:    Reason Eval/Treat Not Completed: Other (comment) Attempted to see pt this AM after coordinating with RN for pain meds; however per RN, pt wants to rest. Will follow.   Marguarite Arbour A Anisia Leija 03/18/2020, 4:21 PM  Marisa Severin, PT, DPT Acute Rehabilitation Services Pager [phone] Office [phone]

## 2020-03-18 NOTE — Progress Notes (Signed)
Daily Rounding Note  03/18/2020, 10:14 AM  LOS: 9 days   SUBJECTIVE:   Chief complaint:  Anorexia, esoph stricture, dysphagia.  S/p esophageal stene   Eating 0 to 10% of full liquid trays per I/O record.   No stools recorded.   Abdominal pain better but getting regular doses of Dilaudid.  No vomiting, no nausea.  Chest pain improved. Patient got up walked around a little bit this morning, took a shower.  OBJECTIVE:         Vital signs in last 24 hours:    Temp:  [98.5 F (36.9 C)-99.2 F (37.3 C)] 98.5 F (36.9 C) (04/30 0611) Pulse Rate:  [93-100] 100 (04/30 0611) Resp:  [16-18] 17 (04/30 0611) BP: (149-161)/(90-96) 157/90 (04/30 0611) SpO2:  [97 %-99 %] 97 % (04/29 2129) Last BM Date: 03/11/20 Filed Weights   03/08/20 1139 03/08/20 1815  Weight: 73.5 kg 74.3 kg   General: More alert and engaged this morning.  Still looks ill but overall better. Heart: RRR. Chest: No labored breathing. Abdomen: Soft.  Minor right-sided tenderness.  Active bowel sounds. Extremities: No CCE. Neuro/Psych: More alert and appropriate this morning.  Speaking more.  Intake/Output from previous day: 04/29 0701 - 04/30 0700 In: 639.7 [P.O.:440; I.V.:199.7] Out: 2300 [Urine:2300]  Intake/Output this shift: Total I/O In: 60 [P.O.:60] Out: 200 [Urine:200]  Lab Results: Recent Labs    03/16/20 0350 03/17/20 0405 03/18/20 0451  WBC 7.7 7.5 8.9  HGB 8.5* 8.9* 8.8*  HCT 26.9* 27.8* 28.5*  PLT 314 344 333   BMET Recent Labs    03/16/20 0350 03/17/20 0405 03/18/20 0451  NA 137 135 133*  K 3.4* 3.7 3.8  CL 100 100 96*  CO2 26 25 27   GLUCOSE 123* 118* 112*  BUN <5* <5* <5*  CREATININE 0.62 0.57 0.55  CALCIUM 8.2* 7.9* 8.1*   LFT Recent Labs    03/16/20 0350 03/18/20 0451  PROT 5.7* 5.8*  ALBUMIN 2.4* 2.4*  AST 51* 44*  ALT 14 13  ALKPHOS 114 110  BILITOT 0.7 0.8   PT/INR No results for input(s): LABPROT, INR  in the last 72 hours. Hepatitis Panel No results for input(s): HEPBSAG, HCVAB, HEPAIGM, HEPBIGM in the last 72 hours.  Studies/Results: DG CHEST PORT 1 VIEW  Result Date: 03/17/2020 CLINICAL DATA:  Hypoxia. EXAM: PORTABLE CHEST 1 VIEW COMPARISON:  03/15/2020 FINDINGS: There is a left chest wall port a catheter with tip at the SVC. Esophageal stent appears unchanged in position. Bilateral pleural effusions are identified, right greater than left. Mild diffuse interstitial edema. IMPRESSION: Bilateral pleural effusions and mild interstitial edema. Unchanged from previous exam. Electronically Signed   By: Kerby Moors M.D.   On: 03/17/2020 09:47    ASSESMENT:   *Dysphagia, esophageal narrowing.    EGD by Dr. Laural Golden 03/04/2020. Severe extrinsic stenosis, traversed with ultrasound slim scope. EGD 03/15/2020, extrinsic esophageal narrowing, stent placed. Tolerating FL diet w   *   Anorexia, multifactorial not the least of which is her metastatic lung cancer, narcotics.  *Metastatic lung cancer. Mets to liver, spleen, lymph nodes and suspected malignant ascites based on CT of 03/06/2020.   *    Chronic pain, uses Dilaudid oral at home.  *    Constipation.  Movantik, bid Senokot,  Miralax prn in place.    *    Bilateral pleural effusions, mild edema.    PLAN   *   Gearing up  for d/c home today.  In future, if po intake does not pick up, may need to broach placing g tube (per IR) with pt and dtr.  As the daughter's goal is for patients overall status to improve enough so that she can resume therapies for her lung cancer.  At current rate, she will dehydrate unless she can receive IVF at home.   *   Protonix 40 po bid long-term.   Reglan 5 mg po q 8 hours/TID, even if not taking much to eat/drink.     If constipation is an issue at home, could add as needed Dulcolax suppositories or Fleet enemas as needed.  *   No plans for office GI fup.  If needed, she has GI Dr Laural Golden in  [place]. GI signing off, available for inpt revisit if needed.      Azucena Freed  03/18/2020, 10:14 AM Phone [phone]

## 2020-03-18 NOTE — Progress Notes (Addendum)
SATURATION QUALIFICATIONS: (This note is used to comply with regulatory documentation for home oxygen)  Patient Saturations on Room Air at Rest = low 90's%   Patient Saturations on Room Air while Ambulating = 88%  Patient Saturations on 2 Liters of oxygen while Ambulating = 93-97%  Please briefly explain why patient needs home oxygen: pateint's saturation dropped to 88% room air with activity.

## 2020-03-18 NOTE — Discharge Instructions (Signed)
Follow with Lindsey Rossetti, MD in 5-7 days  Please get a complete blood count and chemistry panel checked by your Primary MD at your next visit, and again as instructed by your Primary MD. Please get your medications reviewed and adjusted by your Primary MD.  Please request your Primary MD to go over all Hospital Tests and Procedure/Radiological results at the follow up, please get all Hospital records sent to your Prim MD by signing hospital release before you go home.  In some cases, there will be blood work, cultures and biopsy results pending at the time of your discharge. Please request that your primary care M.D. goes through all the records of your hospital data and follows up on these results.  If you had Pneumonia of Lung problems at the Hospital: Please get a 2 view Chest X ray done in 6-8 weeks after hospital discharge or sooner if instructed by your Primary MD.  If you have Congestive Heart Failure: Please call your Cardiologist or Primary MD anytime you have any of the following symptoms:  1) 3 pound weight gain in 24 hours or 5 pounds in 1 week  2) shortness of breath, with or without a dry hacking cough  3) swelling in the hands, feet or stomach  4) if you have to sleep on extra pillows at night in order to breathe  Follow cardiac low salt diet and 1.5 lit/day fluid restriction.  If you have diabetes Accuchecks 4 times/day, Once in AM empty stomach and then before each meal. Log in all results and show them to your primary doctor at your next visit. If any glucose reading is under 80 or above 300 call your primary MD immediately.  If you have Seizure/Convulsions/Epilepsy: Please do not drive, operate heavy machinery, participate in activities at heights or participate in high speed sports until you have seen by Primary MD or a Neurologist and advised to do so again. Per [employer] [place] statutes, patients with seizures are not allowed to drive until they have been  seizure-free for six months.  Use caution when using heavy equipment or power tools. Avoid working on ladders or at heights. Take showers instead of baths. Ensure the water temperature is not too high on the home water heater. Do not go swimming alone. Do not lock yourself in a room alone (i.e. bathroom). When caring for infants or small children, sit down when holding, feeding, or changing them to minimize risk of injury to the child in the event you have a seizure. Maintain good sleep hygiene. Avoid alcohol.   If you had Gastrointestinal Bleeding: Please ask your Primary MD to check a complete blood count within one week of discharge or at your next visit. Your endoscopic/colonoscopic biopsies that are pending at the time of discharge, will also need to followed by your Primary MD.  Get Medicines reviewed and adjusted. Please take all your medications with you for your next visit with your Primary MD  Please request your Primary MD to go over all hospital tests and procedure/radiological results at the follow up, please ask your Primary MD to get all Hospital records sent to his/her office.  If you experience worsening of your admission symptoms, develop shortness of breath, life threatening emergency, suicidal or homicidal thoughts you must seek medical attention immediately by calling 911 or calling your MD immediately  if symptoms less severe.  You must read complete instructions/literature along with all the possible adverse reactions/side effects for all the Medicines you  take and that have been prescribed to you. Take any new Medicines after you have completely understood and accpet all the possible adverse reactions/side effects.   Do not drive or operate heavy machinery when taking Pain medications.   Do not take more than prescribed Pain, Sleep and Anxiety Medications  Special Instructions: If you have smoked or chewed Tobacco  in the last 2 yrs please stop smoking, stop any regular  Alcohol  and or any Recreational drug use.  Wear Seat belts while driving.  Please note You were cared for by a hospitalist during your hospital stay. If you have any questions about your discharge medications or the care you received while you were in the hospital after you are discharged, you can call the unit and asked to speak with the hospitalist on call if the hospitalist that took care of you is not available. Once you are discharged, your primary care physician will handle any further medical issues. Please note that NO REFILLS for any discharge medications will be authorized once you are discharged, as it is imperative that you return to your primary care physician (or establish a relationship with a primary care physician if you do not have one) for your aftercare needs so that they can reassess your need for medications and monitor your lab values.  You can reach the hospitalist office at phone [phone] or fax [phone]   If you do not have a primary care physician, you can call [phone] for a physician referral.  Activity: As tolerated with Full fall precautions use walker/cane & assistance as needed    Diet: soft  Disposition Home

## 2020-03-20 DIAGNOSIS — I10 Essential (primary) hypertension: Secondary | ICD-10-CM | POA: Diagnosis not present

## 2020-03-20 DIAGNOSIS — C969 Malignant neoplasm of lymphoid, hematopoietic and related tissue, unspecified: Secondary | ICD-10-CM | POA: Diagnosis not present

## 2020-03-20 DIAGNOSIS — C3491 Malignant neoplasm of unspecified part of right bronchus or lung: Secondary | ICD-10-CM | POA: Diagnosis not present

## 2020-03-20 DIAGNOSIS — R1314 Dysphagia, pharyngoesophageal phase: Secondary | ICD-10-CM | POA: Diagnosis not present

## 2020-03-20 DIAGNOSIS — C787 Secondary malignant neoplasm of liver and intrahepatic bile duct: Secondary | ICD-10-CM | POA: Diagnosis not present

## 2020-03-20 DIAGNOSIS — K222 Esophageal obstruction: Secondary | ICD-10-CM | POA: Diagnosis not present

## 2020-03-20 DIAGNOSIS — G2581 Restless legs syndrome: Secondary | ICD-10-CM | POA: Diagnosis not present

## 2020-03-20 DIAGNOSIS — C7889 Secondary malignant neoplasm of other digestive organs: Secondary | ICD-10-CM | POA: Diagnosis not present

## 2020-03-20 DIAGNOSIS — D630 Anemia in neoplastic disease: Secondary | ICD-10-CM | POA: Diagnosis not present

## 2020-03-21 ENCOUNTER — Telehealth (HOSPITAL_COMMUNITY): Payer: Self-pay | Admitting: Surgery

## 2020-03-21 ENCOUNTER — Telehealth: Payer: Self-pay | Admitting: *Deleted

## 2020-03-21 ENCOUNTER — Telehealth: Payer: Self-pay | Admitting: Internal Medicine

## 2020-03-21 NOTE — Telephone Encounter (Signed)
Scheduled [facility] Palliative visit for 04/22/2020 at 10:30.

## 2020-03-21 NOTE — Telephone Encounter (Signed)
Pt left a voicemail stating that her mother's blood pressure had come down to 134/69 after taking her Losartan.  Dr. Delton Coombes notified of the change after the pt took her medication.

## 2020-03-21 NOTE — Telephone Encounter (Signed)
The [facility] nurse called to let our office know the pt's blood pressure was elevated during her assessment yesterday (172/100) and that per the pt's daughter her blood pressure was elevated again today.    I called the pt's daughter and she stated that her mother's blood pressure today was 178/121.  She told me that she gave her mom her Losartan 50 mg and will recheck her blood pressure in an hour.  Also, she stated that her mother has been very confused since coming home from the hospital and she is not sleeping at night.  I told the pt's daughter to call me back after she rechecks her mom's blood pressure, and that I would let Dr. Delton Coombes know about the pt's confusion.  She verbalized understanding.

## 2020-03-22 ENCOUNTER — Inpatient Hospital Stay (HOSPITAL_COMMUNITY)
Admission: EM | Admit: 2020-03-22 | Discharge: 2020-04-19 | DRG: 070 | Disposition: E | Payer: Medicare PPO | Attending: Internal Medicine | Admitting: Internal Medicine

## 2020-03-22 ENCOUNTER — Encounter (HOSPITAL_COMMUNITY): Payer: Self-pay | Admitting: *Deleted

## 2020-03-22 ENCOUNTER — Inpatient Hospital Stay (HOSPITAL_COMMUNITY): Payer: Medicare PPO | Admitting: Hematology

## 2020-03-22 ENCOUNTER — Inpatient Hospital Stay (HOSPITAL_COMMUNITY): Payer: Medicare PPO | Attending: Hematology

## 2020-03-22 ENCOUNTER — Other Ambulatory Visit: Payer: Self-pay

## 2020-03-22 ENCOUNTER — Encounter (HOSPITAL_COMMUNITY): Payer: Self-pay | Admitting: Hematology

## 2020-03-22 ENCOUNTER — Ambulatory Visit (HOSPITAL_COMMUNITY)
Admission: RE | Admit: 2020-03-22 | Discharge: 2020-03-22 | Disposition: A | Discharge status: Home / Self Care | Payer: Medicare PPO | Source: Ambulatory Visit | Attending: Hematology | Admitting: Hematology

## 2020-03-22 ENCOUNTER — Inpatient Hospital Stay (HOSPITAL_COMMUNITY): Payer: Medicare PPO

## 2020-03-22 ENCOUNTER — Emergency Department (HOSPITAL_COMMUNITY): Payer: Medicare PPO

## 2020-03-22 VITALS — BP 98/68 | HR 93 | Temp 96.7°F | Resp 24

## 2020-03-22 VITALS — BP 103/59 | HR 87 | Temp 96.8°F | Resp 20

## 2020-03-22 DIAGNOSIS — Z6826 Body mass index (BMI) 26.0-26.9, adult: Secondary | ICD-10-CM

## 2020-03-22 DIAGNOSIS — R0902 Hypoxemia: Secondary | ICD-10-CM | POA: Insufficient documentation

## 2020-03-22 DIAGNOSIS — J9601 Acute respiratory failure with hypoxia: Secondary | ICD-10-CM | POA: Diagnosis present

## 2020-03-22 DIAGNOSIS — E8809 Other disorders of plasma-protein metabolism, not elsewhere classified: Secondary | ICD-10-CM

## 2020-03-22 DIAGNOSIS — E039 Hypothyroidism, unspecified: Secondary | ICD-10-CM | POA: Diagnosis present

## 2020-03-22 DIAGNOSIS — G2581 Restless legs syndrome: Secondary | ICD-10-CM | POA: Diagnosis present

## 2020-03-22 DIAGNOSIS — Z515 Encounter for palliative care: Secondary | ICD-10-CM | POA: Diagnosis not present

## 2020-03-22 DIAGNOSIS — Z8249 Family history of ischemic heart disease and other diseases of the circulatory system: Secondary | ICD-10-CM

## 2020-03-22 DIAGNOSIS — F11959 Opioid use, unspecified with opioid-induced psychotic disorder, unspecified: Secondary | ICD-10-CM | POA: Diagnosis not present

## 2020-03-22 DIAGNOSIS — R0602 Shortness of breath: Secondary | ICD-10-CM

## 2020-03-22 DIAGNOSIS — C787 Secondary malignant neoplasm of liver and intrahepatic bile duct: Secondary | ICD-10-CM | POA: Diagnosis not present

## 2020-03-22 DIAGNOSIS — T40605A Adverse effect of unspecified narcotics, initial encounter: Secondary | ICD-10-CM | POA: Diagnosis present

## 2020-03-22 DIAGNOSIS — E222 Syndrome of inappropriate secretion of antidiuretic hormone: Secondary | ICD-10-CM | POA: Diagnosis present

## 2020-03-22 DIAGNOSIS — Z9981 Dependence on supplemental oxygen: Secondary | ICD-10-CM

## 2020-03-22 DIAGNOSIS — J90 Pleural effusion, not elsewhere classified: Secondary | ICD-10-CM | POA: Diagnosis present

## 2020-03-22 DIAGNOSIS — I2510 Atherosclerotic heart disease of native coronary artery without angina pectoris: Secondary | ICD-10-CM | POA: Diagnosis present

## 2020-03-22 DIAGNOSIS — E876 Hypokalemia: Secondary | ICD-10-CM | POA: Diagnosis present

## 2020-03-22 DIAGNOSIS — C3491 Malignant neoplasm of unspecified part of right bronchus or lung: Secondary | ICD-10-CM | POA: Diagnosis not present

## 2020-03-22 DIAGNOSIS — E785 Hyperlipidemia, unspecified: Secondary | ICD-10-CM | POA: Diagnosis present

## 2020-03-22 DIAGNOSIS — E8770 Fluid overload, unspecified: Secondary | ICD-10-CM | POA: Diagnosis present

## 2020-03-22 DIAGNOSIS — G9341 Metabolic encephalopathy: Secondary | ICD-10-CM | POA: Diagnosis not present

## 2020-03-22 DIAGNOSIS — K222 Esophageal obstruction: Secondary | ICD-10-CM | POA: Diagnosis present

## 2020-03-22 DIAGNOSIS — Z888 Allergy status to other drugs, medicaments and biological substances status: Secondary | ICD-10-CM

## 2020-03-22 DIAGNOSIS — R4182 Altered mental status, unspecified: Secondary | ICD-10-CM | POA: Diagnosis not present

## 2020-03-22 DIAGNOSIS — D630 Anemia in neoplastic disease: Secondary | ICD-10-CM | POA: Diagnosis present

## 2020-03-22 DIAGNOSIS — N390 Urinary tract infection, site not specified: Secondary | ICD-10-CM | POA: Diagnosis present

## 2020-03-22 DIAGNOSIS — R404 Transient alteration of awareness: Secondary | ICD-10-CM

## 2020-03-22 DIAGNOSIS — R627 Adult failure to thrive: Secondary | ICD-10-CM | POA: Diagnosis present

## 2020-03-22 DIAGNOSIS — C7800 Secondary malignant neoplasm of unspecified lung: Secondary | ICD-10-CM

## 2020-03-22 DIAGNOSIS — J810 Acute pulmonary edema: Secondary | ICD-10-CM | POA: Diagnosis present

## 2020-03-22 DIAGNOSIS — Z79899 Other long term (current) drug therapy: Secondary | ICD-10-CM

## 2020-03-22 DIAGNOSIS — K219 Gastro-esophageal reflux disease without esophagitis: Secondary | ICD-10-CM | POA: Diagnosis present

## 2020-03-22 DIAGNOSIS — Z79891 Long term (current) use of opiate analgesic: Secondary | ICD-10-CM

## 2020-03-22 DIAGNOSIS — Z87891 Personal history of nicotine dependence: Secondary | ICD-10-CM

## 2020-03-22 DIAGNOSIS — C779 Secondary and unspecified malignant neoplasm of lymph node, unspecified: Secondary | ICD-10-CM | POA: Diagnosis present

## 2020-03-22 DIAGNOSIS — I672 Cerebral atherosclerosis: Secondary | ICD-10-CM | POA: Diagnosis present

## 2020-03-22 DIAGNOSIS — Z880 Allergy status to penicillin: Secondary | ICD-10-CM

## 2020-03-22 DIAGNOSIS — Z885 Allergy status to narcotic agent status: Secondary | ICD-10-CM

## 2020-03-22 DIAGNOSIS — C7951 Secondary malignant neoplasm of bone: Secondary | ICD-10-CM | POA: Diagnosis not present

## 2020-03-22 DIAGNOSIS — Z79890 Hormone replacement therapy (postmenopausal): Secondary | ICD-10-CM

## 2020-03-22 DIAGNOSIS — Z20822 Contact with and (suspected) exposure to covid-19: Secondary | ICD-10-CM | POA: Diagnosis not present

## 2020-03-22 DIAGNOSIS — J811 Chronic pulmonary edema: Secondary | ICD-10-CM | POA: Diagnosis present

## 2020-03-22 DIAGNOSIS — M8580 Other specified disorders of bone density and structure, unspecified site: Secondary | ICD-10-CM | POA: Diagnosis present

## 2020-03-22 DIAGNOSIS — C3490 Malignant neoplasm of unspecified part of unspecified bronchus or lung: Secondary | ICD-10-CM

## 2020-03-22 DIAGNOSIS — Z8673 Personal history of transient ischemic attack (TIA), and cerebral infarction without residual deficits: Secondary | ICD-10-CM

## 2020-03-22 DIAGNOSIS — I10 Essential (primary) hypertension: Secondary | ICD-10-CM | POA: Diagnosis present

## 2020-03-22 DIAGNOSIS — Z66 Do not resuscitate: Secondary | ICD-10-CM | POA: Diagnosis not present

## 2020-03-22 HISTORY — DX: Secondary malignant neoplasm of unspecified lung: C78.00

## 2020-03-22 LAB — COMPREHENSIVE METABOLIC PANEL
ALT: 16 U/L (ref 0–44)
ALT: 14 U/L (ref 0–44)
AST: 47 U/L — ABNORMAL HIGH (ref 15–41)
AST: 39 U/L (ref 15–41)
Albumin: 2.8 g/dL — ABNORMAL LOW (ref 3.5–5.0)
Albumin: 3.9 g/dL (ref 3.5–5.0)
Alkaline Phosphatase: 157 U/L — ABNORMAL HIGH (ref 38–126)
Alkaline Phosphatase: 122 U/L (ref 38–126)
Anion gap: 16 — ABNORMAL HIGH (ref 5–15)
Anion gap: 11 (ref 5–15)
BUN: 22 mg/dL (ref 8–23)
BUN: 23 mg/dL (ref 8–23)
CO2: 30 mmol/L (ref 22–32)
CO2: 33 mmol/L — ABNORMAL HIGH (ref 22–32)
Calcium: 8.2 mg/dL — ABNORMAL LOW (ref 8.9–10.3)
Calcium: 8.1 mg/dL — ABNORMAL LOW (ref 8.9–10.3)
Chloride: 82 mmol/L — ABNORMAL LOW (ref 98–111)
Chloride: 86 mmol/L — ABNORMAL LOW (ref 98–111)
Creatinine, Ser: 0.71 mg/dL (ref 0.44–1.00)
Creatinine, Ser: 0.8 mg/dL (ref 0.44–1.00)
GFR calc Af Amer: >60 mL/min (ref >60)
GFR calc Af Amer: >60 mL/min (ref >60)
GFR calc non Af Amer: >60 mL/min (ref >60)
GFR calc non Af Amer: >60 mL/min (ref >60)
Glucose, Bld: 120 mg/dL — ABNORMAL HIGH (ref 70–99)
Glucose, Bld: 113 mg/dL — ABNORMAL HIGH (ref 70–99)
Potassium: 3.2 mmol/L — ABNORMAL LOW (ref 3.5–5.1)
Potassium: 3.1 mmol/L — ABNORMAL LOW (ref 3.5–5.1)
Sodium: 128 mmol/L — ABNORMAL LOW (ref 135–145)
Sodium: 130 mmol/L — ABNORMAL LOW (ref 135–145)
Total Bilirubin: 0.7 mg/dL (ref 0.3–1.2)
Total Bilirubin: 1 mg/dL (ref 0.3–1.2)
Total Protein: 6.2 g/dL — ABNORMAL LOW (ref 6.5–8.1)
Total Protein: 6.3 g/dL — ABNORMAL LOW (ref 6.5–8.1)

## 2020-03-22 LAB — URINALYSIS, ROUTINE W REFLEX MICROSCOPIC
Bacteria, UA: NONE SEEN
Bilirubin Urine: NEGATIVE
Glucose, UA: NEGATIVE mg/dL
Ketones, ur: NEGATIVE mg/dL
Nitrite: NEGATIVE
Protein, ur: 30 mg/dL — AB
Specific Gravity, Urine: 1.012 (ref 1.005–1.030)
pH: 5 (ref 5.0–8.0)

## 2020-03-22 LAB — CBC WITH DIFFERENTIAL/PLATELET
Abs Immature Granulocytes: 0.15 10*3/uL — ABNORMAL HIGH (ref 0.00–0.07)
Abs Immature Granulocytes: 0.2 10*3/uL — ABNORMAL HIGH (ref 0.00–0.07)
Basophils Absolute: 0 10*3/uL (ref 0.0–0.1)
Basophils Absolute: 0 10*3/uL (ref 0.0–0.1)
Basophils Relative: 0 %
Basophils Relative: 0 %
Eosinophils Absolute: 0 10*3/uL (ref 0.0–0.5)
Eosinophils Absolute: 0 10*3/uL (ref 0.0–0.5)
Eosinophils Relative: 0 %
Eosinophils Relative: 0 %
HCT: 28.3 % — ABNORMAL LOW (ref 36.0–46.0)
HCT: 26.4 % — ABNORMAL LOW (ref 36.0–46.0)
Hemoglobin: 8.9 g/dL — ABNORMAL LOW (ref 12.0–15.0)
Hemoglobin: 8.2 g/dL — ABNORMAL LOW (ref 12.0–15.0)
Immature Granulocytes: 2 %
Immature Granulocytes: 2 %
Lymphocytes Relative: 7 %
Lymphocytes Relative: 8 %
Lymphs Abs: 0.7 10*3/uL (ref 0.7–4.0)
Lymphs Abs: 0.7 10*3/uL (ref 0.7–4.0)
MCH: 30.5 pg (ref 26.0–34.0)
MCH: 30.6 pg (ref 26.0–34.0)
MCHC: 31.4 g/dL (ref 30.0–36.0)
MCHC: 31.1 g/dL (ref 30.0–36.0)
MCV: 96.9 fL (ref 80.0–100.0)
MCV: 98.5 fL (ref 80.0–100.0)
Monocytes Absolute: 0.9 10*3/uL (ref 0.1–1.0)
Monocytes Absolute: 0.8 10*3/uL (ref 0.1–1.0)
Monocytes Relative: 9 %
Monocytes Relative: 9 %
Neutro Abs: 8.3 10*3/uL — ABNORMAL HIGH (ref 1.7–7.7)
Neutro Abs: 7.8 10*3/uL — ABNORMAL HIGH (ref 1.7–7.7)
Neutrophils Relative %: 82 %
Neutrophils Relative %: 81 %
Platelets: 299 10*3/uL (ref 150–400)
Platelets: 292 10*3/uL (ref 150–400)
RBC: 2.92 MIL/uL — ABNORMAL LOW (ref 3.87–5.11)
RBC: 2.68 MIL/uL — ABNORMAL LOW (ref 3.87–5.11)
RDW: 15.7 % — ABNORMAL HIGH (ref 11.5–15.5)
RDW: 15.9 % — ABNORMAL HIGH (ref 11.5–15.5)
WBC: 10 10*3/uL (ref 4.0–10.5)
WBC: 9.5 10*3/uL (ref 4.0–10.5)
nRBC: 1.6 % — ABNORMAL HIGH (ref 0.0–0.2)
nRBC: 1.8 % — ABNORMAL HIGH (ref 0.0–0.2)

## 2020-03-22 LAB — RESPIRATORY PANEL BY RT PCR (FLU A&B, COVID)
Influenza A by PCR: NEGATIVE
Influenza B by PCR: NEGATIVE
SARS Coronavirus 2 by RT PCR: NEGATIVE

## 2020-03-22 LAB — BRAIN NATRIURETIC PEPTIDE: B Natriuretic Peptide: 415 pg/mL — ABNORMAL HIGH (ref 0.0–100.0)

## 2020-03-22 LAB — MAGNESIUM: Magnesium: 2.1 mg/dL (ref 1.7–2.4)

## 2020-03-22 LAB — CBG MONITORING, ED: Glucose-Capillary: 100 mg/dL — ABNORMAL HIGH (ref 70–99)

## 2020-03-22 MED ORDER — ALTEPLASE 2 MG IJ SOLR
2.0000 mg | Freq: Once | INTRAMUSCULAR | Status: AC
  Administered 2020-03-22: 2 mg

## 2020-03-22 MED ORDER — POLYETHYLENE GLYCOL 3350 17 G PO PACK: 17.0000 g | PACK | Freq: Every day | ORAL | Status: DC | PRN

## 2020-03-22 MED ORDER — FUROSEMIDE 10 MG/ML IJ SOLN
20.0000 mg | Freq: Once | INTRAMUSCULAR | Status: AC
  Administered 2020-03-22: 20 mg via INTRAVENOUS
  Filled 2020-03-22: qty 2

## 2020-03-22 MED ORDER — MORPHINE SULFATE (PF) 2 MG/ML IV SOLN
1.0000 mg | Freq: Four times a day (QID) | INTRAVENOUS | Status: DC | PRN
  Administered 2020-03-22 – 2020-03-23 (×2): 1 mg via INTRAVENOUS
  Filled 2020-03-22 (×2): qty 1

## 2020-03-22 MED ORDER — POTASSIUM CHLORIDE 20 MEQ PO PACK
40.0000 meq | PACK | ORAL | Status: AC
  Administered 2020-03-22 – 2020-03-23 (×2): 40 meq via ORAL
  Filled 2020-03-22 (×2): qty 2

## 2020-03-22 MED ORDER — ALTEPLASE 2 MG IJ SOLR
INTRAMUSCULAR | Status: AC
  Filled 2020-03-22: qty 2

## 2020-03-22 MED ORDER — ALUM & MAG HYDROXIDE-SIMETH 200-200-20 MG/5ML PO SUSP: 30.0000 mL | ORAL | Status: DC | PRN

## 2020-03-22 MED ORDER — ALPRAZOLAM 0.25 MG PO TABS: 0.2500 mg | ORAL_TABLET | Freq: Every evening | ORAL | 0 refills | Status: AC | PRN

## 2020-03-22 MED ORDER — FUROSEMIDE 10 MG/ML IJ SOLN
20.0000 mg | Freq: Once | INTRAMUSCULAR | Status: AC
  Administered 2020-03-22: 23:00:00 20 mg via INTRAVENOUS
  Filled 2020-03-22: qty 2

## 2020-03-22 MED ORDER — FUROSEMIDE 10 MG/ML IJ SOLN
40.0000 mg | Freq: Two times a day (BID) | INTRAMUSCULAR | Status: DC
  Administered 2020-03-23: 09:00:00 40 mg via INTRAVENOUS
  Filled 2020-03-22: qty 4

## 2020-03-22 MED ORDER — NALOXEGOL OXALATE 25 MG PO TABS
25.0000 mg | ORAL_TABLET | Freq: Every day | ORAL | Status: DC
  Filled 2020-03-22 (×3): qty 1

## 2020-03-22 MED ORDER — SODIUM CHLORIDE 0.9% FLUSH
20.0000 mL | INTRAVENOUS | Status: DC | PRN
  Administered 2020-03-22: 20 mL via INTRAVENOUS

## 2020-03-22 MED ORDER — STERILE WATER FOR INJECTION IJ SOLN
INTRAMUSCULAR | Status: AC
  Filled 2020-03-22: qty 10

## 2020-03-22 MED ORDER — LOSARTAN POTASSIUM 50 MG PO TABS
25.0000 mg | ORAL_TABLET | Freq: Every day | ORAL | Status: DC
  Filled 2020-03-22: qty 1

## 2020-03-22 MED ORDER — SODIUM CHLORIDE 0.9 % IV SOLN: INTRAVENOUS | Status: DC

## 2020-03-22 MED ORDER — HEPARIN SOD (PORK) LOCK FLUSH 100 UNIT/ML IV SOLN
500.0000 [IU] | Freq: Once | INTRAVENOUS | Status: AC
  Administered 2020-03-22: 500 [IU] via INTRAVENOUS

## 2020-03-22 MED ORDER — NALOXONE HCL 0.4 MG/ML IJ SOLN
0.4000 mg | Freq: Once | INTRAMUSCULAR | Status: AC
  Administered 2020-03-22: 0.4 mg via INTRAVENOUS
  Filled 2020-03-22: qty 1

## 2020-03-22 MED ORDER — LEVOTHYROXINE SODIUM 75 MCG PO TABS
75.0000 ug | ORAL_TABLET | Freq: Every day | ORAL | Status: DC
  Filled 2020-03-22 (×2): qty 1

## 2020-03-22 MED ORDER — ALBUTEROL SULFATE (2.5 MG/3ML) 0.083% IN NEBU
2.5000 mg | INHALATION_SOLUTION | RESPIRATORY_TRACT | Status: DC | PRN
  Administered 2020-03-23: 03:00:00 2.5 mg via RESPIRATORY_TRACT
  Filled 2020-03-22: qty 3

## 2020-03-22 MED ORDER — ENOXAPARIN SODIUM 40 MG/0.4ML ~~LOC~~ SOLN
40.0000 mg | Freq: Every day | SUBCUTANEOUS | Status: DC
  Administered 2020-03-22: 40 mg via SUBCUTANEOUS
  Filled 2020-03-22: qty 0.4

## 2020-03-22 MED ORDER — PANTOPRAZOLE SODIUM 40 MG PO TBEC
40.0000 mg | DELAYED_RELEASE_TABLET | Freq: Two times a day (BID) | ORAL | Status: DC
  Administered 2020-03-22: 40 mg via ORAL
  Filled 2020-03-22 (×2): qty 1

## 2020-03-22 MED ORDER — FOSFOMYCIN TROMETHAMINE 3 G PO PACK
3.0000 g | PACK | Freq: Once | ORAL | Status: AC
  Administered 2020-03-22: 3 g via ORAL
  Filled 2020-03-22: qty 3

## 2020-03-22 MED ORDER — ACETAMINOPHEN 650 MG RE SUPP: 650.0000 mg | Freq: Four times a day (QID) | RECTAL | Status: DC | PRN

## 2020-03-22 MED ORDER — HYDROMORPHONE HCL 1 MG/ML PO LIQD: 0.5000 mg | Freq: Four times a day (QID) | ORAL | Status: DC | PRN

## 2020-03-22 MED ORDER — ACETAMINOPHEN 325 MG PO TABS: 650.0000 mg | ORAL_TABLET | Freq: Four times a day (QID) | ORAL | Status: DC | PRN

## 2020-03-22 MED ORDER — ALBUMIN HUMAN 25 % IV SOLN
50.0000 g | Freq: Once | INTRAVENOUS | Status: AC
  Administered 2020-03-22: 50 g via INTRAVENOUS
  Filled 2020-03-22: qty 200

## 2020-03-22 NOTE — Progress Notes (Signed)

## 2020-03-22 NOTE — Telephone Encounter (Signed)
Pt has been hypotensive most of the time due to her cancer, dehydration I would get BP readings from daughter, if less than 140/90 then do not restart any meds If her BP has been running high, they can put her back on 1/2 tablet of losartan she was last taking    Agree with [facility] and palliative care

## 2020-03-22 NOTE — ED Triage Notes (Signed)
Pt's daughter reports pt has AMS, worsening today after her appt with her cancer doctor. Pt's daughter reports she is normally alert, oriented, converses normally and can follow directions. Pt was recently in the hospital about 1 week ago due to having an esophageal stent placed at [facility]. Ever since her discharge the daughter reports her AMS has been getting worse.

## 2020-03-22 NOTE — Patient Instructions (Signed)
[facility] at [facility] Discharge Instructions  You were seen today by Dr. Katragadda. He went over your recent lab results. He will see you back in  for labs and follow up.   Thank you for choosing [facility] at [facility] to provide your oncology and hematology care.  To afford each patient quality time with our provider, please arrive at least 15 minutes before your scheduled appointment time.   If you have a lab appointment with the Cancer Center please come in thru the  Main Entrance and check in at the main information desk  You need to re-schedule your appointment should you arrive 10 or more minutes late.  We strive to give you quality time with our providers, and arriving late affects you and other patients whose appointments are after yours.  Also, if you no show three or more times for appointments you may be dismissed from the clinic at the providers discretion.     Again, thank you for choosing [facility].  Our hope is that these requests will decrease the amount of time that you wait before being seen by our physicians.       _____________________________________________________________  Should you have questions after your visit to [facility], please contact our office at [phone] between the hours of 8:00 a.m. and 4:30 p.m.  Voicemails left after 4:00 p.m. will not be returned until the following business day.  For prescription refill requests, have your pharmacy contact our office and allow 72 hours.    Cancer Center Support Programs:   > Cancer Support Group  2nd Tuesday of the month 1pm-2pm, Journey Room    

## 2020-03-22 NOTE — Assessment & Plan Note (Signed)
1.  Metastatic squamous cell carcinoma of the lung: -CT scans from 03/06/2020 showed progression on pembrolizumab. -She had metastatic adenopathy causing extrinsic compression of the esophagus.  She had EGD and stent placement on 03/15/2020. -She was discharged home from [facility] on 03/18/2020. -Her daughter Velva Harman reports confusion and patient talking out of the head while she was in the hospital last week as well as at home. -Today when she came to our office, her saturations were 83%.  Saturations improved to 99% on 2 L nasal cannula. -Patient is reportedly not using oxygen all the time. -She was given Dilaudid 1 mg tablet 20 minutes prior to coming to our office.  She is also taking Dilaudid 1 mg every 3 hours during the daytime on average. -Patient is drowsy when I saw her today.  We have done chest x-ray in the office.  I have compared it with the chest x-ray from last week.  No significant changes although there is some questionable pulmonary edema.  We have also done labs in our office today.  She has hyponatremia. -I had a prolonged discussion with her daughter Velva Harman about her rapid deterioration.  I have recommended DNR and palliative care. -Palliative care is coming to her home on Wednesday. -She was given Lasix 20 mg IV in the office.  She was also given albumin. -I have also called and talked to her daughter [name].

## 2020-03-22 NOTE — Progress Notes (Signed)
Implanted port, left upper chest, accessed using sterile technique. Blood obtained during access. Line easily pulls blood back and flushed with ease. Sterile dressing applied and is clean, dry and intact at time of access.

## 2020-03-22 NOTE — ED Provider Notes (Signed)
[facility] EMERGENCY DEPARTMENT Provider Note   CSN: [id number] Arrival date & time: 03/28/2020  1527     History Chief Complaint  Patient presents with  . Altered Mental Status   LEVEL 5 CAVEAT - ALTERED MENTAL STATUS  Lindsey Mullins is a 74 y.o. female with PMHx metastatic SCC of the lung, HTN, HLD, GERD, who presents to the ED with daughter for AMS since being discharged from the hospital 4 days ago after having esophageal stent placed. Daughter reports that pt is normally active and converses normally however since leaving the hospital she has seemed very confused specifically at nighttime. Daughter mentions that during the day the past couple of days she has been doing well but will decline at night. She does mention that her confusion progressed into this morning prior to being taken to the cancer center where she had labs, a chest xray, and was given protein and lasix to help draw fluid off of her lungs. Daughter attempted to take patient home however reports she was unable to due to pt being so weak and noncommunicative. Pt is currently a full code - daughter wants to know if this is her cancer progressing or if it is an infection/specific cause that can be fixed.   Per chart review: Pt presented to the ED from cancer center on 04/20 for generalized weakness, abdominal/back pain and constipation. ED course included stable vitals, sodium 131, u/a with rare bacteria and 6-10 WBCs. Admitted for failure to thrive.   Hospital Course / Discharge diagnoses: Principal Problem Metastatic lung cancer with severe extrinsic esophageal stenosis, failure to thrive / dysphagia-patient was admitted to the hospital with dysphagia and difficulty swallowing in the setting of severe extrinsic esophageal stenosis.  She was evaluated by gastroenterology and eventually underwent an EGD on 4/27 with esophageal stent placement.  She was monitored following the stent placement, gradually improving, her pain  seems to be better and fairly well controlled on oral agents, she is able to eat more and her appetite is picking up.  She is feeling stronger, asking to go home, and will be discharged in stable condition.  She was also started on PPI.  Palliative care was also consulted and followed patient while hospitalized, she will have outpatient follow-up with palliative. Active Problems Hypokalemia/hypomagnesemia-repleted and now improved Acute hypoxic respiratory failure-suspect a chronic component as well given metastatic lung cancer, she will be discharged on 2 L nasal cannula. Anemia of chronic disease/metastatic cancer-no bleeding Hypothyroidism-continue Synthroid, TSH was found to be quite elevated and the Synthroid dose has been increased Hypertension-resume home meds  The history is provided by medical records and a relative.       Past Medical History:  Diagnosis Date  . Atypical chest pain 05/23/2012   STRESS TEST - small to moderate sized area of partial reversibility of the anteroapical wall, most likely breast artifact, post-stress EF 67%, EKG show NSR at 65, No Lexiscan EKG changes, non-diagnostic for ischemia; STRESS TEST, 01/25/2010 - normal study, post-stress EF 66%, no significant ischemia  . Cancer ([place])   . Cerebral atherosclerosis 09/29/2012   CAROTID DUPLEX - RIGHT  BULB/PROXIMAL ICA-moderate amount of fibrous plaque, 50-69% diameter reduction; LEFT CEA-normal, no significant diameter reduction  . Colitis   . GERD (gastroesophageal reflux disease)   . Hyperlipidemia   . Hypertension   . Metastatic cancer to lung ([place])    stage 4  . Osteopenia   . Restless leg syndrome   . Shortness of breath 03/30/2005  2D ECHO - EF >55%, normal  . TIA (transient ischemic attack) 01/27/2010   2D ECHO - EF 65%, normal    Patient Active Problem List   Diagnosis Date Noted  . Esophageal stenosis   . Odynophagia   . Failure to thrive in adult 03/09/2020  . Palliative care by specialist     . Left low back pain   . Failure to thrive (0-17) 03/08/2020  . Hypothyroidism 01/26/2020  . Peripheral edema 09/11/2019  . Malignant neoplasm of right lung ([place])   . Goals of care, counseling/discussion 06/10/2019  . Squamous cell carcinoma of lung, stage IV, right ([place]) 05/28/2019  . Metastatic cancer ([place]) 05/26/2019  . Cervical lymphadenopathy   . Compression fracture of L2 ([place]) 04/29/2019  . Arteriosclerosis, mesenteric artery ([place]) 12/31/2018  . Abdominal pain 12/22/2018  . Colitis 12/22/2018  . Osteopenia   . DDD (degenerative disc disease), lumbar 05/11/2016  . Obesity 05/11/2016  . Glucose intolerance (impaired glucose tolerance) 01/12/2016  . Pain in the chest   . Chest pain 01/10/2016  . GERD (gastroesophageal reflux disease) 01/26/2015  . Stress and adjustment reaction 01/26/2015  . Carotid artery disease ([place]) 04/20/2013  . Bunion, left 09/21/2012  . Overweight(278.02) 06/04/2012  . Essential hypertension, benign 04/02/2012  . Hyperlipidemia 04/02/2012  . Dysphagia 04/02/2012  . RLS (restless legs syndrome) 04/02/2012    Past Surgical History:  Procedure Laterality Date  . ABDOMINAL EXPLORATION SURGERY    . CAROTID ENDARTERECTOMY    . ESOPHAGEAL STENT PLACEMENT N/A 03/15/2020   Procedure: ESOPHAGEAL STENT PLACEMENT;  Surgeon: Rush Landmark Telford Nab., MD;  Location: [place];  Service: Gastroenterology;  Laterality: N/A;  . ESOPHAGOGASTRODUODENOSCOPY N/A 03/15/2020   Procedure: ESOPHAGOGASTRODUODENOSCOPY (EGD);  Surgeon: Irving Copas., MD;  Location: [place];  Service: Gastroenterology;  Laterality: N/A;  . ESOPHAGOGASTRODUODENOSCOPY (EGD) WITH PROPOFOL N/A 03/04/2020   Procedure: ESOPHAGOGASTRODUODENOSCOPY (EGD) WITH PROPOFOL;  Surgeon: Rogene Houston, MD;  [facility];  Service: Endoscopy;  Laterality: N/A;  210  . LYMPH NODE BIOPSY N/A 05/20/2019   Procedure: LYMPH NODE BIOPSY CERVICAL;  Surgeon: Aviva Signs, MD;  Location: [facility];  Service: General;  Laterality: N/A;  . Peripheral Vascular Catheterization  07/16/2005   Right verterbral-50% smooth segmental badsilar artery stenosis on right side, Right carotid-50% ulcerated proxmial stenosis, Left carotid-60 to 70% left externam carotid artery stenosis, 90% proximal left ICA stenosis  . PORTACATH PLACEMENT Left 06/12/2019   Procedure: INSERTION PORT-A-CATH (catheter attached left subclavian);  Surgeon: Aviva Signs, MD;  Location: [facility];  Service: General;  Laterality: Left;     OB History    Gravida  2   Para  2   Term  2   Preterm      AB      Living        SAB      TAB      Ectopic      Multiple      Live Births              Family History  Problem Relation Age of Onset  . Heart disease Mother   . Hypertension Mother   . Hypertension Father   . Heart disease Father     Social History   Tobacco Use  . Smoking status: Former Smoker    Packs/day: 0.25    Years: 10.00    Pack years: 2.50    Types: Cigarettes    Quit date: 04/21/1983  Years since quitting: 36.9  . Smokeless tobacco: Never Used  Substance Use Topics  . Alcohol use: No  . Drug use: No    Home Medications Prior to Admission medications   Medication Sig Start Date End Date Taking? Authorizing Provider  alum & mag hydroxide-simeth (MAALOX/MYLANTA) 200-200-20 MG/5ML suspension Take 30 mLs by mouth every 4 (four) hours as needed for indigestion. 03/18/20  Yes Gherghe, Vella Redhead, MD  HYDROmorphone (DILAUDID) 2 MG tablet Take 0.5 tablets (1 mg total) by mouth every 4 (four) hours as needed for severe pain. 03/18/20  Yes Caren Griffins, MD  levothyroxine (SYNTHROID) 75 MCG tablet Take 1 tablet (75 mcg total) by mouth daily before breakfast. 03/19/20  Yes Gherghe, Vella Redhead, MD  losartan (COZAAR) 50 MG tablet Take 25 mg by mouth daily.   Yes [provider]  naloxegol oxalate (MOVANTIK) 25 MG TABS tablet Take 1 tablet (25 mg total) by mouth daily. 03/07/20  Yes  Derek Jack, MD  nitroGLYCERIN (NITROSTAT) 0.4 MG SL tablet DISSOLVE ONE TABLET UNDER THE TONGUE AS NEEDED FOR CHEST PAIN**MAX 3 TABLETS EACH 5 MINUTES APART** 09/14/16  Yes Mangum, Modena Nunnery, MD  ondansetron (ZOFRAN ODT) 4 MG disintegrating tablet Take 1 tablet (4 mg total) by mouth every 8 (eight) hours as needed for nausea or vomiting. 03/18/20  Yes Gherghe, Vella Redhead, MD  pantoprazole (PROTONIX) 40 MG tablet Take 1 tablet (40 mg total) by mouth 2 (two) times daily. 03/18/20  Yes Gherghe, Vella Redhead, MD  rosuvastatin (CRESTOR) 20 MG tablet Take 20 mg by mouth daily.   Yes [provider]  ALPRAZolam (XANAX) 0.25 MG tablet Take 1 tablet (0.25 mg total) by mouth at bedtime as needed for anxiety. 03/25/2020   Derek Jack, MD    Allergies    Penicillins, Codeine, Lipitor [atorvastatin], Lisinopril, and Pravastatin  Review of Systems   Review of Systems  Unable to perform ROS: Mental status change  Psychiatric/Behavioral: Positive for confusion.    Physical Exam Updated Vital Signs BP 139/73 (BP Location: Right Arm)   Pulse 97   Temp 97.7 F (36.5 C) (Oral)   Resp 16   Ht 5\' 6"  (1.676 m)   Wt 74.3 kg   SpO2 97%   BMI 26.44 kg/m   Physical Exam Vitals and nursing note reviewed.  Constitutional:      Appearance: She is not ill-appearing or diaphoretic.  HENT:     Head: Normocephalic and atraumatic.  Eyes:     Conjunctiva/sclera: Conjunctivae normal.     Pupils: Pupils are equal, round, and reactive to light.  Cardiovascular:     Rate and Rhythm: Normal rate and regular rhythm.     Pulses: Normal pulses.  Pulmonary:     Effort: Pulmonary effort is normal.     Breath sounds: Normal breath sounds. No wheezing, rhonchi or rales.     Comments: On 2 L chronic home O2. Satting 95%.  Abdominal:     Palpations: Abdomen is soft.     Tenderness: There is no abdominal tenderness. There is no guarding or rebound.  Musculoskeletal:     Cervical back: Neck supple.    Skin:    General: Skin is warm and dry.  Neurological:     GCS: GCS eye subscore is 4. GCS verbal subscore is 1. GCS motor subscore is 5.     Comments: GCS 10     ED Results / Procedures / Treatments   Labs (all labs ordered are listed, but  only abnormal results are displayed) Labs Reviewed  COMPREHENSIVE METABOLIC PANEL - Abnormal; Notable for the following components:      Result Value   Sodium 130 (*)    Potassium 3.1 (*)    Chloride 86 (*)    CO2 33 (*)    Glucose, Bld 113 (*)    Calcium 8.1 (*)    Total Protein 6.3 (*)    All other components within normal limits  CBC WITH DIFFERENTIAL/PLATELET - Abnormal; Notable for the following components:   RBC 2.68 (*)    Hemoglobin 8.2 (*)    HCT 26.4 (*)    RDW 15.9 (*)    nRBC 1.8 (*)    Neutro Abs 7.8 (*)    Abs Immature Granulocytes 0.20 (*)    All other components within normal limits  URINALYSIS, ROUTINE W REFLEX MICROSCOPIC - Abnormal; Notable for the following components:   APPearance HAZY (*)    Hgb urine dipstick SMALL (*)    Protein, ur 30 (*)    Leukocytes,Ua TRACE (*)    All other components within normal limits  CBG MONITORING, ED - Abnormal; Notable for the following components:   Glucose-Capillary 100 (*)    All other components within normal limits  CULTURE, BLOOD (ROUTINE X 2)  CULTURE, BLOOD (ROUTINE X 2)  URINE CULTURE  RESPIRATORY PANEL BY RT PCR (FLU A&B, COVID)  LACTIC ACID, PLASMA  LACTIC ACID, PLASMA    EKG EKG Interpretation  Date/Time:  Tuesday Mar 22 2020 16:20:34 EDT Ventricular Rate:  99 PR Interval:    QRS Duration: 79 QT Interval:  385 QTC Calculation: 492 R Axis:   119 Text Interpretation: Sinus rhythm Atrial premature complex Probable lateral infarct, age indeterminate Baseline wander No significant change since last tracing Confirmed by Calvert Cantor [phone]) on 03/21/2020 5:45:57 PM   Radiology CT Head Wo Contrast  Result Date: 04/08/2020 CLINICAL DATA:  Of the status  change. EXAM: CT HEAD WITHOUT CONTRAST TECHNIQUE: Contiguous axial images were obtained from the base of the skull through the vertex without intravenous contrast. COMPARISON:  MRI dated June 05, 2019. FINDINGS: Brain: No evidence of acute infarction, hemorrhage, hydrocephalus, extra-axial collection or mass lesion/mass effect. Vascular: No hyperdense vessel or unexpected calcification. Skull: Normal. Negative for fracture or focal lesion. Sinuses/Orbits: No acute finding. Other: None. IMPRESSION: No acute intracranial pathology. Electronically Signed   By: Constance Holster M.D.   On: 03/31/2020 19:23   DG Chest Port 1 View  Result Date: 03/31/2020 CLINICAL DATA:  Shortness of breath. History of metastatic lung cancer. EXAM: PORTABLE CHEST 1 VIEW COMPARISON:  March 17, 2020. FINDINGS: Stable cardiomediastinal silhouette is noted with central pulmonary vascular congestion and probable bilateral pulmonary edema. Bilateral pleural effusions are noted, right greater than left, with associated basilar atelectasis. Esophageal stent is unchanged. Left subclavian Port-A-Cath is unchanged. Bony thorax is unremarkable. No pneumothorax is noted. IMPRESSION: Stable bilateral pleural effusions, right greater than left, with associated basilar atelectasis. Central pulmonary vascular congestion and probable bilateral pulmonary edema. Electronically Signed   By: Marijo Conception M.D.   On: 03/27/2020 10:46    Procedures Procedures (including critical care time)  Medications Ordered in ED Medications  fosfomycin (MONUROL) packet 3 g (has no administration in time range)  naloxone [facility]) injection 0.4 mg (0.4 mg Intravenous Given 04/03/2020 1931)    ED Course  I have reviewed the triage vital signs and the nursing notes.  Pertinent labs & imaging results that were available during my  care of the patient were reviewed by me and considered in my medical decision making (see chart for details).    MDM  Rules/Calculators/A&P                      74 year old female with hx metastatic SCC of lung currently under palliative care presenting to the ED with progressively worsening AMS s/p discharge from hospital on 04/30 s/2 failure to thrive/requiring esophageal stenting. Pt unable to give any history; GCS at 10. Pt appears to be maintaining airway. On arrival to the ED she is afebrile, nontachycardic, and nontachypneic. Satting 95% on 2L O2 which she was discharged with from the hospital 4 days ago. Seen at cancer center earlier today for eval and then daughter decided to bring patient here. Had labwork and CXR done at cancer center with findings of:  IMPRESSION: Stable bilateral pleural effusions, right greater than left, with associated basilar atelectasis. Central pulmonary vascular congestion and probable bilateral pulmonary edema. Daughter reports lasix given.  Pt evaluated by attending physician Dr. Karle Starch as well. Will workup for AMS today. Will obtain CT head with concern that the cancer could have spread to the brain. Last imaging of brain MRI 05/2019 without metastasis however pt did have MRI L and T spine in Jan and Feb of this year with mets to the spine. Pt is currently a full code but daughter in agreement to speak with her sister to decide best course for patient at this time.   CBC without leukocytosis. Hgb 8.2 which appears to be around pt's baseline.  CMP with potassium 3.1, sodium 130, chloride 86, bicarb 33, glucose 113.  EKG not crossing over into the system - unchanged from previous   Received call from pt's oncologist Dr. Delton Coombes who evaluated pt today in clinic and noted she was very sleepy on exam. There is some concern it could be related to pt's pain medication. He reports pt is very sensitive to narcotics however has been taking Dilaudid q3 hours for the past week. Will give narcan and reassess.   U/A with trace leuks, 11-20 WBCs per HPF. Will send for culture. Will give  fosfomycin in the ED; pt with PCN allergy and does not appear to have had cephalosporins in the past.   CT head negative.   After narcan pt is significantly more alert. She is up and talking. She still has some confusion and believes she is at [facility] currently. Per daughter pt was originally on 0.25 mg dilaudid q3 hours prior to hospitalization and afterwards was placed on 1 mg dilaudid q3 hrs which appears to be cause of pt's altered mental status.   Discussed case with Dr. Denton Brick who agrees to evaluate patient for admission.   This note was prepared using Dragon voice recognition software and may include unintentional dictation errors due to the inherent limitations of voice recognition software.  Final Clinical Impression(s) / ED Diagnoses Final diagnoses:  Primary malignant neoplasm of lung metastatic to other site, unspecified laterality ([place])  Altered level of consciousness  Narcotic induced mental alteration    Rx / DC Orders ED Discharge Orders    None       Eustaquio Maize, Hershal Coria 03/28/2020 2038    Truddie Hidden, MD 04/06/2020 [phone]

## 2020-03-22 NOTE — ED Notes (Signed)
Family states that last time pt had a cath she got a uti and would like to use the pure wick instead, pure wick placed, PA notified.

## 2020-03-22 NOTE — Progress Notes (Signed)
[place], [place] [address]   CLINIC:  Medical Oncology/Hematology  PCP:  [name], MD [address] [place] [id number] [phone]   REASON FOR VISIT: Metastatic squamous cell lung cancer.   INTERVAL HISTORY:  Ms. Gunner 74 y.o. female seen for follow-up of metastatic lung cancer.  She was discharged from [facility] on 03/18/2020.  She had EGD and stent placement on 03/15/2020.  According to Velva Harman her daughter, she is eating some blended food every 3 hours, 4-5 spoonfuls.  No regurgitation.  She has also tried chicken noodle soup and oatmeal.  She is also drinking boost in between.  She reports that she has not been sleeping since she went home.  She is being given Dilaudid 1 mg every 3-4 hours and pain is very well controlled.  Velva Harman reports that she has been confused since she has been home.  She reportedly walked herself to the bathroom yesterday a couple of times.  Velva Harman is very concerned that she might fall.  REVIEW OF SYSTEMS:  Review of Systems  Constitutional: Positive for fatigue.  Respiratory: Positive for shortness of breath.   Cardiovascular: Positive for leg swelling.  Psychiatric/Behavioral: Positive for sleep disturbance.  All other systems reviewed and are negative.    PAST MEDICAL/SURGICAL HISTORY:  Past Medical History:  Diagnosis Date  . Atypical chest pain 05/23/2012   STRESS TEST - small to moderate sized area of partial reversibility of the anteroapical wall, most likely breast artifact, post-stress EF 67%, EKG show NSR at 65, No Lexiscan EKG changes, non-diagnostic for ischemia; STRESS TEST, 01/25/2010 - normal study, post-stress EF 66%, no significant ischemia  . Cancer ([place])   . Cerebral atherosclerosis 09/29/2012   CAROTID DUPLEX - RIGHT  BULB/PROXIMAL ICA-moderate amount of fibrous plaque, 50-69% diameter reduction; LEFT CEA-normal, no significant diameter reduction  . Colitis   . GERD  (gastroesophageal reflux disease)   . Hyperlipidemia   . Hypertension   . Metastatic cancer to lung ([place])    stage 4  . Osteopenia   . Restless leg syndrome   . Shortness of breath 03/30/2005   2D ECHO - EF >55%, normal  . TIA (transient ischemic attack) 01/27/2010   2D ECHO - EF 65%, normal   Past Surgical History:  Procedure Laterality Date  . ABDOMINAL EXPLORATION SURGERY    . CAROTID ENDARTERECTOMY    . ESOPHAGEAL STENT PLACEMENT N/A 03/15/2020   Procedure: ESOPHAGEAL STENT PLACEMENT;  Surgeon: Rush Landmark Telford Nab., MD;  Location: [facility];  Service: Gastroenterology;  Laterality: N/A;  . ESOPHAGOGASTRODUODENOSCOPY N/A 03/15/2020   Procedure: ESOPHAGOGASTRODUODENOSCOPY (EGD);  Surgeon: Irving Copas., MD;  Location: [facility];  Service: Gastroenterology;  Laterality: N/A;  . ESOPHAGOGASTRODUODENOSCOPY (EGD) WITH PROPOFOL N/A 03/04/2020   Procedure: ESOPHAGOGASTRODUODENOSCOPY (EGD) WITH PROPOFOL;  Surgeon: Rogene Houston, MD;  [facility];  Service: Endoscopy;  Laterality: N/A;  210  . LYMPH NODE BIOPSY N/A 05/20/2019   Procedure: LYMPH NODE BIOPSY CERVICAL;  Surgeon: Aviva Signs, MD;  Location: [facility];  Service: General;  Laterality: N/A;  . Peripheral Vascular Catheterization  07/16/2005   Right verterbral-50% smooth segmental badsilar artery stenosis on right side, Right carotid-50% ulcerated proxmial stenosis, Left carotid-60 to 70% left externam carotid artery stenosis, 90% proximal left ICA stenosis  . PORTACATH PLACEMENT Left 06/12/2019   Procedure: INSERTION PORT-A-CATH (catheter attached left subclavian);  Surgeon: Aviva Signs, MD;  Location: [facility];  Service:  General;  Laterality: Left;     SOCIAL HISTORY:  Social History   Socioeconomic History  . Marital status: Divorced    Spouse name: Not on file  . Number of children: 2  . Years of education: Not on file  . Highest education level: Not on file  Occupational History  . Not on file    Tobacco Use  . Smoking status: Former Smoker    Packs/day: 0.25    Years: 10.00    Pack years: 2.50    Types: Cigarettes    Quit date: 04/21/1983    Years since quitting: 36.9  . Smokeless tobacco: Never Used  Substance and Sexual Activity  . Alcohol use: No  . Drug use: No  . Sexual activity: Not Currently  Other Topics Concern  . Not on file  Social History Narrative  . Not on file   Social Determinants of Health   Financial Resource Strain: Low Risk   . Difficulty of Paying Living Expenses: Not hard at all  Food Insecurity: No Food Insecurity  . Worried About Charity fundraiser in the Last Year: Never true  . Ran Out of Food in the Last Year: Never true  Transportation Needs: No Transportation Needs  . Lack of Transportation (Medical): No  . Lack of Transportation (Non-Medical): No  Physical Activity: Inactive  . Days of Exercise per Week: 0 days  . Minutes of Exercise per Session: 0 min  Stress: No Stress Concern Present  . Feeling of Stress : Not at all  Social Connections: Somewhat Isolated  . Frequency of Communication with Friends and Family: Twice a week  . Frequency of Social Gatherings with Friends and Family: Twice a week  . Attends Religious Services: More than 4 times per year  . Active Member of Clubs or Organizations: No  . Attends Archivist Meetings: Never  . Marital Status: Divorced  Human resources officer Violence: Not At Risk  . Fear of Current or Ex-Partner: No  . Emotionally Abused: No  . Physically Abused: No  . Sexually Abused: No    FAMILY HISTORY:  Family History  Problem Relation Age of Onset  . Heart disease Mother   . Hypertension Mother   . Hypertension Father   . Heart disease Father     CURRENT MEDICATIONS:  Facility-Administered Encounter Medications as of 04/05/2020  Medication  . sodium chloride flush (NS) 0.9 % injection 10 mL   Outpatient Encounter Medications as of 03/31/2020  Medication Sig Note  . ALPRAZolam  (XANAX) 0.25 MG tablet Take 1 tablet (0.25 mg total) by mouth at bedtime as needed for anxiety.   [name] alum & mag hydroxide-simeth (MAALOX/MYLANTA) 200-200-20 MG/5ML suspension Take 30 mLs by mouth every 4 (four) hours as needed for indigestion.   [name] HYDROmorphone (DILAUDID) 2 MG tablet Take 0.5 tablets (1 mg total) by mouth every 4 (four) hours as needed for severe pain.   [name] levothyroxine (SYNTHROID) 75 MCG tablet Take 1 tablet (75 mcg total) by mouth daily before breakfast.   . losartan (COZAAR) 50 MG tablet Take 25 mg by mouth daily.   . naloxegol oxalate (MOVANTIK) 25 MG TABS tablet Take 1 tablet (25 mg total) by mouth daily.   . nitroGLYCERIN (NITROSTAT) 0.4 MG SL tablet DISSOLVE ONE TABLET UNDER THE TONGUE AS NEEDED FOR CHEST PAIN**MAX 3 TABLETS EACH 5 MINUTES APART** 03/04/2020: Have not used this in 20 years  . ondansetron (ZOFRAN ODT) 4 MG disintegrating tablet Take 1 tablet (4  mg total) by mouth every 8 (eight) hours as needed for nausea or vomiting.   . pantoprazole (PROTONIX) 40 MG tablet Take 1 tablet (40 mg total) by mouth 2 (two) times daily.   . rosuvastatin (CRESTOR) 20 MG tablet Take 20 mg by mouth daily.     ALLERGIES:  Allergies  Allergen Reactions  . Penicillins Shortness Of Breath and Other (See Comments)    Has patient had a PCN reaction causing immediate rash, facial/tongue/throat swelling, SOB or lightheadedness with hypotension: Yes Has patient had a PCN reaction causing severe rash involving mucus membranes or skin necrosis: Unk Has patient had a PCN reaction that required hospitalization: Unk Has patient had a PCN reaction occurring within the last 10 years: Yes "States that she was in ICU after being administered"   . Codeine Nausea And Vomiting  . Lipitor [Atorvastatin] Other (See Comments)    Myalgias  . Lisinopril Cough  . Pravastatin Other (See Comments)    Myalgias     PHYSICAL EXAM:  ECOG Performance status: 1  Vitals:   03/23/2020 0917  BP: 98/68    Pulse: 93  Resp: (!) 24  Temp: (!) 96.7 F (35.9 C)  SpO2: 99%   Filed Weights    Physical Exam Constitutional:      Appearance: Normal appearance. She is normal weight.  Cardiovascular:     Rate and Rhythm: Normal rate and regular rhythm.     Heart sounds: Normal heart sounds.  Pulmonary:     Effort: Pulmonary effort is normal.     Breath sounds: Normal breath sounds.  Abdominal:     General: Bowel sounds are normal.     Palpations: Abdomen is soft.  Musculoskeletal:        General: Normal range of motion.  Skin:    General: Skin is warm and dry.  Neurological:     Mental Status: She is alert and oriented to person, place, and time. Mental status is at baseline.  Psychiatric:        Mood and Affect: Mood normal.        Behavior: Behavior normal.        Thought Content: Thought content normal.        Judgment: Judgment normal.      LABORATORY DATA:  I have reviewed the labs as listed.  CBC    Component Value Date/Time   WBC 9.5 03/23/2020 1658   RBC 2.68 (L) 04/18/2020 1658   HGB 8.2 (L) 04/05/2020 1658   HCT 26.4 (L) 03/28/2020 1658   PLT 292 03/28/2020 1658   MCV 98.5 03/19/2020 1658   MCH 30.6 04/09/2020 1658   MCHC 31.1 03/21/2020 1658   RDW 15.9 (H) 04/05/2020 1658   LYMPHSABS 0.7 03/27/2020 1658   MONOABS 0.8 04/01/2020 1658   EOSABS 0.0 03/26/2020 1658   BASOSABS 0.0 04/06/2020 1658   CMP Latest Ref Rng & Units 03/21/2020 04/04/2020 03/18/2020  Glucose 70 - 99 mg/dL 113(H) 120(H) 112(H)  BUN 8 - 23 mg/dL 23 22 <5(L)  Creatinine 0.44 - 1.00 mg/dL 0.80 0.71 0.55  Sodium 135 - 145 mmol/L 130(L) 128(L) 133(L)  Potassium 3.5 - 5.1 mmol/L 3.1(L) 3.2(L) 3.8  Chloride 98 - 111 mmol/L 86(L) 82(L) 96(L)  CO2 22 - 32 mmol/L 33(H) 30 27  Calcium 8.9 - 10.3 mg/dL 8.1(L) 8.2(L) 8.1(L)  Total Protein 6.5 - 8.1 g/dL 6.3(L) 6.2(L) 5.8(L)  Total Bilirubin 0.3 - 1.2 mg/dL 1.0 0.7 0.8  Alkaline Phos 38 - 126 U/L  122 157(H) 110  AST 15 - 41 U/L 39 47(H) 44(H)  ALT 0  - 44 U/L 14 16 13        DIAGNOSTIC IMAGING:  I have independently reviewed scans.   ASSESSMENT & PLAN:   Squamous cell carcinoma of lung, stage IV, right ([place]) 1.  Metastatic squamous cell carcinoma of the lung: -CT scans from 03/06/2020 showed progression on pembrolizumab. -She had metastatic adenopathy causing extrinsic compression of the esophagus.  She had EGD and stent placement on 03/15/2020. -She was discharged home from [facility] on 03/18/2020. -Her daughter Velva Harman reports confusion and patient talking out of the head while she was in the hospital last week as well as at home. -Today when she came to our office, her saturations were 83%.  Saturations improved to 99% on 2 L nasal cannula. -Patient is reportedly not using oxygen all the time. -She was given Dilaudid 1 mg tablet 20 minutes prior to coming to our office.  She is also taking Dilaudid 1 mg every 3 hours during the daytime on average. -Patient is drowsy when I saw her today.  We have done chest x-ray in the office.  I have compared it with the chest x-ray from last week.  No significant changes although there is some questionable pulmonary edema.  We have also done labs in our office today.  She has hyponatremia. -I had a prolonged discussion with her daughter Velva Harman about her rapid deterioration.  I have recommended DNR and palliative care. -Palliative care is coming to her home on Wednesday. -She was given Lasix 20 mg IV in the office.  She was also given albumin. -I have also called and talked to her daughter [name].   Addendum: -The patient was taken to the ER as she was very weak.  I have called and talked to the PA ,[employer].  Plan is to check for UA.  CT of the head was also ordered.  We have also discussed giving her Narcan and see if she wakes up.  She will be highly likely admitted to the hospital for failure to thrive.  If there is no infectious causes for her confusion, will consider inpatient palliative  care/hospice facility.  I have also discussed this with her daughter [name] over the phone.   Orders placed this encounter:  No orders of the defined types were placed in this encounter.  Total time spent is 40 minutes with more than 80% of the time spent face-to-face discussing lab results, scan results, prognosis, counseling and coordination of care.   Derek Jack, MD [place] [phone]

## 2020-03-22 NOTE — ED Notes (Signed)
Pt helped into bed, pt not opening eyes, family at bedside, SWAT rn at bedside using ultrasound to look for an iv placement.

## 2020-03-22 NOTE — Telephone Encounter (Signed)
Received call from [place], [facility] SN with [facility] [phone]~ telephone.   Reports that evaluation has been completed and requested VO for HH SN 2x1, 1x2, 1 QOD x6, and 1 PRN for cancer management.   Also requested VO for [facility] SW to evaluate and treat as indicated for community assistance.   PCP reports that Palliative care order was approved through [facility].   Call placed to [place], [facility] SN. Was advised that no conflict noted between [facility] SN/SW and Palliative care. VO given.  HH SN also reports that patient was noted in significant pain. States that she will consult with oncology for recommendations on pain management.   States that she also noted elevated BP at that time (172/ 100). Patient and daughter reports that BP is WNL when not in pain.   MD please advise.

## 2020-03-22 NOTE — Progress Notes (Signed)
03/27/2020  Orders received to give:  Albumin 50 gm IVPB x 1 Lasix 20 mg IV push x 1  Orders entered as above.  T.O. Dr Beckey Downing, LPN/Leary Mcnulty Ronnald Ramp, PharmD

## 2020-03-22 NOTE — ED Notes (Signed)
Pt had positive response to narcan, pt eyes are now open and she is talking with daughter.

## 2020-03-22 NOTE — H&P (Addendum)
History and Physical    Lindsey Mullins WNI:[id number] DOB: [birth year] DOA: 04/01/2020  PCP: [name], MD   Patient coming from: Home  I have personally briefly reviewed patient's old medical records in [place]  Chief Complaint: Altered mental status  HPI: Lindsey Mullins is a 74 y.o. female with medical history significant for metastatic lung cancer, hypertension, hypothyroidism.  Patient was brought to the ED with complaints of altered mental status-drowsiness, confusion.  Patient was seen by her oncologist today, she was noted to be drowsy, with O2 sats 83% on room air improved with 2 L nasal cannula.  Patient had taking Dilaudid 1 mg tablet about 20 minutes prior to coming to the office.  Dose of Dilaudid was adjusted on recent hospitalization from 0.25 mg to 1 mg of Dilaudid every 3 hours.  Blood work was obtained, portable chest x-ray also.  IV Lasix 20 mg x 1 was given, with albumin.  With concern for opioid overdose, patient was sent to the ED. At the time of my evaluation status post Narcan, patient is awake but appears lethargic, she reports pain with urination over the past 3 to 4 days.  She has had increased difficulty breathing over the past week.  Daughter also reports worsening lower extremity swelling, that has improved to the due to the compression stockings patient is wearing and her legs have been elevated today. No chest pain.  Recent hospitalization 4/22 4/30 for failure to thrive, unable to eat due to dysphagia from extrinsic esophageal stenosis from metastatic lymphadenopathy.  Patient was transferred to [facility] for palliative esophageal stent which was placed 4/27 by Dr. Rush Landmark.  ED Course: O2 sat 88% on room air, placed on 4 L nasal cannula, temperature 97.7.  Blood pressure 120s to 140s.  Potassium 3.1.  Sodium 130.  Magnesium 2.1.  UA trace leukocytes 11-20 WBCs.  Fosfomycin x1 dose given in ED.  Head CT without acute abnormality.  0.4 mg of  Narcan given in the ED with significant improvement in mental status, patient's woke-up and started talking to daughter, but not fully back to baseline.  Hospitalist to admit for altered mental status.  Review of Systems: As per HPI all other systems reviewed and negative.  Past Medical History:  Diagnosis Date  . Atypical chest pain 05/23/2012   STRESS TEST - small to moderate sized area of partial reversibility of the anteroapical wall, most likely breast artifact, post-stress EF 67%, EKG show NSR at 65, No Lexiscan EKG changes, non-diagnostic for ischemia; STRESS TEST, 01/25/2010 - normal study, post-stress EF 66%, no significant ischemia  . Cancer ([place])   . Cerebral atherosclerosis 09/29/2012   CAROTID DUPLEX - RIGHT  BULB/PROXIMAL ICA-moderate amount of fibrous plaque, 50-69% diameter reduction; LEFT CEA-normal, no significant diameter reduction  . Colitis   . GERD (gastroesophageal reflux disease)   . Hyperlipidemia   . Hypertension   . Metastatic cancer to lung ([place])    stage 4  . Osteopenia   . Restless leg syndrome   . Shortness of breath 03/30/2005   2D ECHO - EF >55%, normal  . TIA (transient ischemic attack) 01/27/2010   2D ECHO - EF 65%, normal    Past Surgical History:  Procedure Laterality Date  . ABDOMINAL EXPLORATION SURGERY    . CAROTID ENDARTERECTOMY    . ESOPHAGEAL STENT PLACEMENT N/A 03/15/2020   Procedure: ESOPHAGEAL STENT PLACEMENT;  Surgeon: Rush Landmark Telford Nab., MD;  Location: [place];  Service: Gastroenterology;  Laterality:  N/A;  . ESOPHAGOGASTRODUODENOSCOPY N/A 03/15/2020   Procedure: ESOPHAGOGASTRODUODENOSCOPY (EGD);  Surgeon: Irving Copas., MD;  Location: [place];  Service: Gastroenterology;  Laterality: N/A;  . ESOPHAGOGASTRODUODENOSCOPY (EGD) WITH PROPOFOL N/A 03/04/2020   Procedure: ESOPHAGOGASTRODUODENOSCOPY (EGD) WITH PROPOFOL;  Surgeon: Rogene Houston, MD;  [facility];  Service: Endoscopy;  Laterality: N/A;  210  .  LYMPH NODE BIOPSY N/A 05/20/2019   Procedure: LYMPH NODE BIOPSY CERVICAL;  Surgeon: Aviva Signs, MD;  Location: [facility];  Service: General;  Laterality: N/A;  . Peripheral Vascular Catheterization  07/16/2005   Right verterbral-50% smooth segmental badsilar artery stenosis on right side, Right carotid-50% ulcerated proxmial stenosis, Left carotid-60 to 70% left externam carotid artery stenosis, 90% proximal left ICA stenosis  . PORTACATH PLACEMENT Left 06/12/2019   Procedure: INSERTION PORT-A-CATH (catheter attached left subclavian);  Surgeon: Aviva Signs, MD;  Location: [facility];  Service: General;  Laterality: Left;     reports that she quit smoking about 36 years ago. Her smoking use included cigarettes. She has a 2.50 pack-year smoking history. She has never used smokeless tobacco. She reports that she does not drink alcohol or use drugs.  Allergies  Allergen Reactions  . Penicillins Shortness Of Breath and Other (See Comments)    Has patient had a PCN reaction causing immediate rash, facial/tongue/throat swelling, SOB or lightheadedness with hypotension: Yes Has patient had a PCN reaction causing severe rash involving mucus membranes or skin necrosis: Unk Has patient had a PCN reaction that required hospitalization: Unk Has patient had a PCN reaction occurring within the last 10 years: Yes "States that she was in ICU after being administered"   . Codeine Nausea And Vomiting  . Lipitor [Atorvastatin] Other (See Comments)    Myalgias  . Lisinopril Cough  . Pravastatin Other (See Comments)    Myalgias    Family History  Problem Relation Age of Onset  . Heart disease Mother   . Hypertension Mother   . Hypertension Father   . Heart disease Father     Prior to Admission medications   Medication Sig Start Date End Date Taking? Authorizing Provider  alum & mag hydroxide-simeth (MAALOX/MYLANTA) 200-200-20 MG/5ML suspension Take 30 mLs by mouth every 4 (four) hours as needed for  indigestion. 03/18/20  Yes Gherghe, Vella Redhead, MD  HYDROmorphone (DILAUDID) 2 MG tablet Take 0.5 tablets (1 mg total) by mouth every 4 (four) hours as needed for severe pain. 03/18/20  Yes Caren Griffins, MD  levothyroxine (SYNTHROID) 75 MCG tablet Take 1 tablet (75 mcg total) by mouth daily before breakfast. 03/19/20  Yes Gherghe, Vella Redhead, MD  losartan (COZAAR) 50 MG tablet Take 25 mg by mouth daily.   Yes [provider]  naloxegol oxalate (MOVANTIK) 25 MG TABS tablet Take 1 tablet (25 mg total) by mouth daily. 03/07/20  Yes Derek Jack, MD  nitroGLYCERIN (NITROSTAT) 0.4 MG SL tablet DISSOLVE ONE TABLET UNDER THE TONGUE AS NEEDED FOR CHEST PAIN**MAX 3 TABLETS EACH 5 MINUTES APART** 09/14/16  Yes [place], Modena Nunnery, MD  ondansetron (ZOFRAN ODT) 4 MG disintegrating tablet Take 1 tablet (4 mg total) by mouth every 8 (eight) hours as needed for nausea or vomiting. 03/18/20  Yes Gherghe, Vella Redhead, MD  pantoprazole (PROTONIX) 40 MG tablet Take 1 tablet (40 mg total) by mouth 2 (two) times daily. 03/18/20  Yes Gherghe, Vella Redhead, MD  rosuvastatin (CRESTOR) 20 MG tablet Take 20 mg by mouth daily.   Yes [provider]  ALPRAZolam (XANAX) 0.25 MG tablet Take 1 tablet (0.25 mg total) by mouth at bedtime as needed for anxiety. 04/17/2020   Derek Jack, MD    Physical Exam: Vitals:   04/05/2020 1558 04/13/2020 1600 04/06/2020 1608 04/05/2020 1944  BP:   139/73 (!) 146/86  Pulse:   97 (!) 102  Resp:   16 (!) 23  Temp:   97.7 F (36.5 C) 97.8 F (36.6 C)  TempSrc:   Oral Oral  SpO2:  (!) 88% 97% 91%  Weight: 74.3 kg     Height: 5\' 6"  (1.676 m)       Constitutional: Acutely and chronically ill-appearing, Vitals:   03/28/2020 1558 03/31/2020 1600 04/04/2020 1608 04/07/2020 1944  BP:   139/73 (!) 146/86  Pulse:   97 (!) 102  Resp:   16 (!) 23  Temp:   97.7 F (36.5 C) 97.8 F (36.6 C)  TempSrc:   Oral Oral  SpO2:  (!) 88% 97% 91%  Weight: 74.3 kg     Height: 5\' 6"  (1.676 m)       Eyes: PERRL, lids and conjunctivae normal ENMT: Mucous membranes are moist.   Neck: normal, supple, no masses, no thyromegaly Respiratory: Appears dyspneic with accessory muscle use.  Cardiovascular: Regular rate and rhythm, no murmurs / rubs / gallops.  Compression stockings present, trace to 1+ bilateral extremity swelling,. 2+ pedal pulses.   Abdomen: no tenderness, no masses palpated. No hepatosplenomegaly. Bowel sounds positive.  Musculoskeletal: no clubbing / cyanosis. No joint deformity upper and lower extremities. Good ROM, no contractures. Normal muscle tone.  Skin: no rashes, lesions, ulcers. No induration Neurologic: No apparent cranial nerve abnormality, moving all extremities spontaneously. Psychiatric: Awake, lethargic, answers most questions appropriately.   Labs on Admission: I have personally reviewed following labs and imaging studies  CBC: Recent Labs  Lab 03/16/20 0350 03/17/20 0405 03/18/20 0451 04/09/2020 1116 04/05/2020 1658  WBC 7.7 7.5 8.9 10.0 9.5  NEUTROABS  --   --   --  8.3* 7.8*  HGB 8.5* 8.9* 8.8* 8.9* 8.2*  HCT 26.9* 27.8* 28.5* 28.3* 26.4*  MCV 97.1 98.9 99.0 96.9 98.5  PLT 314 344 333 299 035   Basic Metabolic Panel: Recent Labs  Lab 03/16/20 0350 03/17/20 0405 03/18/20 0451 04/14/2020 1116 04/02/2020 1658  NA 137 135 133* 128* 130*  K 3.4* 3.7 3.8 3.2* 3.1*  CL 100 100 96* 82* 86*  CO2 26 25 27 30  33*  GLUCOSE 123* 118* 112* 120* 113*  BUN <5* <5* <5* 22 23  CREATININE 0.62 0.57 0.55 0.71 0.80  CALCIUM 8.2* 7.9* 8.1* 8.2* 8.1*  MG 1.6*  --  1.7 2.1  --   PHOS  --   --  2.9  --   --    Liver Function Tests: Recent Labs  Lab 03/16/20 0350 03/18/20 0451 03/23/2020 1116 04/13/2020 1658  AST 51* 44* 47* 39  ALT 14 13 16 14   ALKPHOS 114 110 157* 122  BILITOT 0.7 0.8 0.7 1.0  PROT 5.7* 5.8* 6.2* 6.3*  ALBUMIN 2.4* 2.4* 2.8* 3.9   CBG: Recent Labs  Lab 04/01/2020 1606  GLUCAP 100*   Urine analysis:    Component Value Date/Time    COLORURINE YELLOW 04/10/2020 1651   APPEARANCEUR HAZY (A) 04/10/2020 1651   LABSPEC 1.012 04/07/2020 1651   PHURINE 5.0 03/26/2020 1651   GLUCOSEU NEGATIVE 04/11/2020 1651   HGBUR SMALL (A) 04/08/2020 1651   BILIRUBINUR NEGATIVE 04/16/2020 1651   KETONESUR NEGATIVE  03/21/2020 1651   PROTEINUR 30 (A) 03/31/2020 1651   UROBILINOGEN 0.2 11/30/2013 0811   NITRITE NEGATIVE 04/09/2020 1651   LEUKOCYTESUR TRACE (A) 04/08/2020 1651    Radiological Exams on Admission: CT Head Wo Contrast  Result Date: 03/21/2020 CLINICAL DATA:  Of the status change. EXAM: CT HEAD WITHOUT CONTRAST TECHNIQUE: Contiguous axial images were obtained from the base of the skull through the vertex without intravenous contrast. COMPARISON:  MRI dated June 05, 2019. FINDINGS: Brain: No evidence of acute infarction, hemorrhage, hydrocephalus, extra-axial collection or mass lesion/mass effect. Vascular: No hyperdense vessel or unexpected calcification. Skull: Normal. Negative for fracture or focal lesion. Sinuses/Orbits: No acute finding. Other: None. IMPRESSION: No acute intracranial pathology. Electronically Signed   By: Constance Holster M.D.   On: 04/17/2020 19:23   DG Chest Port 1 View  Result Date: 03/20/2020 CLINICAL DATA:  Shortness of breath. History of metastatic lung cancer. EXAM: PORTABLE CHEST 1 VIEW COMPARISON:  March 17, 2020. FINDINGS: Stable cardiomediastinal silhouette is noted with central pulmonary vascular congestion and probable bilateral pulmonary edema. Bilateral pleural effusions are noted, right greater than left, with associated basilar atelectasis. Esophageal stent is unchanged. Left subclavian Port-A-Cath is unchanged. Bony thorax is unremarkable. No pneumothorax is noted. IMPRESSION: Stable bilateral pleural effusions, right greater than left, with associated basilar atelectasis. Central pulmonary vascular congestion and probable bilateral pulmonary edema. Electronically Signed   By: Marijo Conception M.D.    On: 04/05/2020 10:46    EKG: Independently reviewed.  Sinus rhythm, QTC prolonged at 512.  No significant ST or T wave changes compared to prior EKG.  Assessment/Plan Principal Problem:   AMS (altered mental status) Active Problems:   Essential hypertension, benign   Squamous cell carcinoma of lung, stage IV, right (HCC)   Esophageal stenosis  Metabolic encephalopathy-improved but not back to baseline, likely due to opioid use, previously on 0.5mg  of Dilaudid, dose was increased to 1 mg every 3 hourly.  Marked improvement in mental status after 0.4 mg Narcan given.  Head CT unremarkable.  Possible UTI also, reports dysuria, UA with trace leukocytes 11-20 WBCs.  Recent hospitalization, patient was on treatment for UTI, with ciprofloxacin, this was substituted for fosfomycin due to prolonged QTC. - Maintaining airway, will hold off on further narcan doses for now -Fosfomycin x1 given in ED (penicillin allergy, cephalosporins not given in the past) -Follow-up urine cultures -Cautious use of narcotics  Acute hypoxic respiratory failure- O2 sats down to 88% on room air in the ED, currently on 4 L, likely from opioids but also chest x-ray shows stable bilateral pleural effusions R > L , central pulmonary vascular congestion, probable bilateral pulmonary edema.  She has worsening peripheral edema- per daughter.  BNP elevated at 415.  Blood work shows albumin of 2.8 prior to albumin infusion in clinic.   -No echo on file, but with poor prognosis, I deferred echocardiogram -IV Lasix 20 mg given, will continue with 40 mg daily -Input output, daily weights -BMP in a.m.  Hyponatremia-130.  Likely due to hypervolemia. -IV Lasix  Hypokalemia-3.1. -Replete  Esophageal stenosis status post palliative esophageal stent-by Dr. Rush Landmark 4/27, from extrinsic compression of the esophagus from metastatic lymphadenopathy.  Per daughter since discharge patient's p.o. intake has improved compared to prior.    -Full liquid diet  Matastatic squamous cell carcinoma of the lung-follows with Dr. Delton Coombes.  Per last CT 4/18, CT scan showed progression of disease on pembrolizumab.  Per notes today, discussion about patient's rapid deterioration with  DNR status and palliative care recommended.  -Palliative care consult. - Iv morphine 1mg  q6hrprn, 0.5mg  po dilaudid and liquid dilaudidnot available. - Resume morvantik.  Hypertension-stable. -Resume home losartan.  Prolonged Qtc- 512. Not new. Potassium 3.1. Normal magnesium.   DVT prophylaxis: Lovenox Code Status: Full code.  Patient's daughter Velva Harman present at bedside tells me CODE STATUS was mentioned today by her oncologist, she fully understands patient's poor prognosis,, but decision has not been made to make patient DNR.  She is waiting for her sister [name] who is a nurse to come before further decision about will be made. Family Communication: Daughter Velva Harman at bedside. Disposition Plan:  1 - 2 days, pending continued improvement in mental status, and establishing new pain control regimen Consults called: Palliative care Admission status: Inpatient, telemetry  Status is: Inpatient The patient will require care spanning > 2 midnights and should be moved to inpatient because: Altered mental status, Inpatient level of care appropriate due to severity of illness and Acute respiratory failure Dispo: The patient is from: Home              Anticipated d/c is to: pending palliative care evaluation, home, hospice care?              Anticipated d/c date is: 2 days                Bethena Roys MD [facility]  04/15/2020, 9:55 PM

## 2020-03-22 NOTE — Progress Notes (Signed)
1155 Labs reviewed with and pt seen by Dr. Delton Coombes and Beryle Flock infusion to be held today with pt to receive Albumin infusion as well as Lasix IV per MD                                                 Lindsey Mullins tolerated Albumin infusion well without issues. VSS upon discharge with O2 on at 4L. Pt drowsy with conversation confused at times.Pt discharged via wheelchair in stable condition accompanied by her daughter

## 2020-03-22 NOTE — Patient Instructions (Signed)
[place] at [facility] Discharge Instructions  Keytruda infusion held today. Pt received Albumin infusion only. Follow-up as scheduled. Call clinic for any questions or concerns   Thank you for choosing [place] at [facility] to provide your oncology and hematology care.  To afford each patient quality time with our provider, please arrive at least 15 minutes before your scheduled appointment time.   If you have a lab appointment with the [facility] please come in thru the Main Entrance and check in at the main information desk.  You need to re-schedule your appointment should you arrive 10 or more minutes late.  We strive to give you quality time with our providers, and arriving late affects you and other patients whose appointments are after yours.  Also, if you no show three or more times for appointments you may be dismissed from the clinic at the providers discretion.     Again, thank you for choosing [facility].  Our hope is that these requests will decrease the amount of time that you wait before being seen by our physicians.       _____________________________________________________________  Should you have questions after your visit to [facility], please contact our office at [phone] between the hours of 8:00 a.m. and 4:30 p.m.  Voicemails left after 4:00 p.m. will not be returned until the following business day.  For prescription refill requests, have your pharmacy contact our office and allow 72 hours.    Due to Covid, you will need to wear a mask upon entering the hospital. If you do not have a mask, a mask will be given to you at the Main Entrance upon arrival. For doctor visits, patients may have 1 support person with them. For treatment visits, patients can not have anyone with them due to social distancing guidelines and our immunocompromised population.

## 2020-03-22 NOTE — Telephone Encounter (Signed)
Call placed to patient daughter [name].   Reports that she did give 1/2 Losartan tab yesterday per oncology. Reports that BP bottomed out during the night, but has returned to normal at this time.   Advised that if BP >140/90, give 1/2 tab and push fluids.

## 2020-03-23 DIAGNOSIS — R4182 Altered mental status, unspecified: Secondary | ICD-10-CM

## 2020-03-23 LAB — BASIC METABOLIC PANEL
Anion gap: 7 (ref 5–15)
BUN: 24 mg/dL — ABNORMAL HIGH (ref 8–23)
CO2: 33 mmol/L — ABNORMAL HIGH (ref 22–32)
Calcium: 8 mg/dL — ABNORMAL LOW (ref 8.9–10.3)
Chloride: 89 mmol/L — ABNORMAL LOW (ref 98–111)
Creatinine, Ser: 0.79 mg/dL (ref 0.44–1.00)
GFR calc Af Amer: >60 mL/min (ref >60)
GFR calc non Af Amer: >60 mL/min (ref >60)
Glucose, Bld: 109 mg/dL — ABNORMAL HIGH (ref 70–99)
Potassium: 3.8 mmol/L (ref 3.5–5.1)
Sodium: 129 mmol/L — ABNORMAL LOW (ref 135–145)

## 2020-03-23 MED ORDER — MORPHINE SULFATE (PF) 2 MG/ML IV SOLN: 2.0000 mg | INTRAVENOUS | Status: DC | PRN

## 2020-03-23 MED ORDER — KCL IN DEXTROSE-NACL 20-5-0.9 MEQ/L-%-% IV SOLN: INTRAVENOUS | Status: DC

## 2020-03-23 MED ORDER — ENSURE ENLIVE PO LIQD: 237.0000 mL | Freq: Three times a day (TID) | ORAL | Status: DC

## 2020-03-23 MED ORDER — PRO-STAT SUGAR FREE PO LIQD: 30.0000 mL | Freq: Three times a day (TID) | ORAL | Status: DC

## 2020-03-23 MED ORDER — LEVOTHYROXINE SODIUM 100 MCG/5ML IV SOLN
50.0000 ug | INTRAVENOUS | Status: DC
  Administered 2020-03-23: 13:00:00 50 ug via INTRAVENOUS
  Filled 2020-03-23: qty 5

## 2020-03-23 MED ORDER — LORAZEPAM 2 MG/ML IJ SOLN: 1.0000 mg | INTRAMUSCULAR | Status: DC | PRN

## 2020-03-23 MED ORDER — ORAL CARE MOUTH RINSE
15.0000 mL | Freq: Two times a day (BID) | OROMUCOSAL | Status: DC
  Administered 2020-03-23: 09:00:00 15 mL via OROMUCOSAL

## 2020-03-23 MED ORDER — ENSURE ENLIVE PO LIQD: 237.0000 mL | Freq: Two times a day (BID) | ORAL | Status: DC

## 2020-03-23 NOTE — Progress Notes (Addendum)
PROGRESS NOTE    Lindsey Mullins  Lindsey Mullins:[id number] DOB: January 19, 1946 DOA: 04/18/2020 PCP: [name], MD   Brief Narrative:  Per HPI: Lindsey Mullins is a 74 y.o. female with medical history significant for metastatic lung cancer, hypertension, hypothyroidism.  Patient was brought to the ED with complaints of altered mental status-drowsiness, confusion.  Patient was seen by her oncologist today, she was noted to be drowsy, with O2 sats 83% on room air improved with 2 L nasal cannula.  Patient had taking Dilaudid 1 mg tablet about 20 minutes prior to coming to the office.  Dose of Dilaudid was adjusted on recent hospitalization from 0.25 mg to 1 mg of Dilaudid every 3 hours.  Blood work was obtained, portable chest x-ray also.  IV Lasix 20 mg x 1 was given, with albumin.  With concern for opioid overdose, patient was sent to the ED. At the time of my evaluation status post Narcan, patient is awake but appears lethargic, she reports pain with urination over the past 3 to 4 days.  She has had increased difficulty breathing over the past week.  Daughter also reports worsening lower extremity swelling, that has improved to the due to the compression stockings patient is wearing and her legs have been elevated today. No chest pain.  5/5: Patient continues to have ongoing somnolence with altered mentation and is requiring nasal cannula oxygen.  Family at bedside has not decided on CODE STATUS.  Palliative evaluation pending.  Assessment & Plan:   Principal Problem:   AMS (altered mental status) Active Problems:   Essential hypertension, benign   Squamous cell carcinoma of lung, stage IV, right (HCC)   Esophageal stenosis   Acute metabolic encephalopathy   Encephalopathy-possibly multifactorial -Secondary to increased use of narcotics with improvement in mentation after Narcan given -CT head unremarkable -Appears to also have had UTI with fosfomycin given in ED -Urine cultures  pending -Narcotics have not been scheduled and discussed holding with RN unless absolutely needed -Also appears to have some failure to thrive which is likely contributing  Acute hypoxemic respiratory failure likely secondary to some pulmonary edema -Continue ongoing Lasix for now and monitor a.m. labs -Monitor intake and output  Chronic hyponatremia -Likely multifactorial with some SIADH as well as with poor oral intake as well as hypervolemia -Continue to monitor as she is currently stable -Continue IV Lasix  Hypokalemia -Currently replete, reevaluate in a.m.  Hypothyroidism -Switch oral medication to IV due to inability to take p.o. currently  Esophageal stenosis status post palliative esophageal stent -Continue on full liquid diet -Stent placed 4/27 due to compression secondary to metastatic lymphadenopathy  Metastatic squamous cell carcinoma of the lung -Appreciate palliative care evaluation -IV pain medications for severe pain, hold for somnolence -Resume Movantik  Hypertension -Currently stable -Blood pressure medications withheld on account of inability to take oral medications -Continue to monitor and add labetalol if needed  DVT prophylaxis:Lovenox Code Status: Full code Family Communication: Daughter at bedside Disposition Plan: Plan for palliative discussion with family regarding inappropriate: Status and likely need for hospice arrangement.  Need to work on improvement of mentation/pain control regimen.  Status is: Inpatient  Remains inpatient appropriate because:Altered mental status   Dispo: The patient is from: Home              Anticipated d/c is to: Home              Anticipated d/c date is: 2 days  Patient currently is not medically stable to d/c.  With ongoing altered mentation and oxygen requirements.  Palliative evaluation pending with likely need for home hospice soon if family and patient agreeable.   Consultants:   Palliative  care  Procedures:   None  Antimicrobials:   None   Subjective: Patient seen and evaluated today with ongoing somnolence noted this morning.  Daughter is at bedside.  Objective: Vitals:   03/23/20 0500 03/23/20 0601 03/23/20 0836 03/23/20 1400  BP:  119/74 110/72 121/72  Pulse:  93  84  Resp:   (!) 22 16  Temp:  (!) 97.5 F (36.4 C)    TempSrc:  Oral    SpO2:  94%  97%  Weight: 81.5 kg     Height:       No intake or output data in the 24 hours ending 03/23/20 1508 Filed Weights   03/26/2020 1558 04/02/2020 2219 03/23/20 0500  Weight: 74.3 kg 80.7 kg 81.5 kg    Examination:  General exam: Appears somnolent Respiratory system: Clear to auscultation. Respiratory effort normal.  Currently on nasal cannula oxygen. Cardiovascular system: S1 & S2 heard, RRR. No JVD, murmurs, rubs, gallops or clicks. No pedal edema. Gastrointestinal system: Abdomen is nondistended, soft and nontender. No organomegaly or masses felt. Normal bowel sounds heard. Central nervous system: Somnolent Extremities: No edema noted Skin: No rashes, lesions or ulcers Psychiatry: Cannot be assessed given patient condition    Data Reviewed: I have personally reviewed following labs and imaging studies  CBC: Recent Labs  Lab 03/17/20 0405 03/18/20 0451 04/03/2020 1116 04/10/2020 1658  WBC 7.5 8.9 10.0 9.5  NEUTROABS  --   --  8.3* 7.8*  HGB 8.9* 8.8* 8.9* 8.2*  HCT 27.8* 28.5* 28.3* 26.4*  MCV 98.9 99.0 96.9 98.5  PLT 344 333 299 952   Basic Metabolic Panel: Recent Labs  Lab 03/17/20 0405 03/18/20 0451 04/01/2020 1116 03/22/2020 1658 03/23/20 0613  NA 135 133* 128* 130* 129*  K 3.7 3.8 3.2* 3.1* 3.8  CL 100 96* 82* 86* 89*  CO2 25 27 30  33* 33*  GLUCOSE 118* 112* 120* 113* 109*  BUN <5* <5* 22 23 24*  CREATININE 0.57 0.55 0.71 0.80 0.79  CALCIUM 7.9* 8.1* 8.2* 8.1* 8.0*  MG  --  1.7 2.1  --   --   PHOS  --  2.9  --   --   --    GFR: Estimated Creatinine Clearance: 68.4 mL/min (by C-G  formula based on SCr of 0.79 mg/dL). Liver Function Tests: Recent Labs  Lab 03/18/20 0451 04/14/2020 1116 03/21/2020 1658  AST 44* 47* 39  ALT 13 16 14   ALKPHOS 110 157* 122  BILITOT 0.8 0.7 1.0  PROT 5.8* 6.2* 6.3*  ALBUMIN 2.4* 2.8* 3.9   No results for input(s): LIPASE, AMYLASE in the last 168 hours. No results for input(s): AMMONIA in the last 168 hours. Coagulation Profile: No results for input(s): INR, PROTIME in the last 168 hours. Cardiac Enzymes: No results for input(s): CKTOTAL, CKMB, CKMBINDEX, TROPONINI in the last 168 hours. BNP (last 3 results) No results for input(s): PROBNP in the last 8760 hours. HbA1C: No results for input(s): HGBA1C in the last 72 hours. CBG: Recent Labs  Lab 03/27/2020 1606  GLUCAP 100*   Lipid Profile: No results for input(s): CHOL, HDL, LDLCALC, TRIG, CHOLHDL, LDLDIRECT in the last 72 hours. Thyroid Function Tests: No results for input(s): TSH, T4TOTAL, FREET4, T3FREE, THYROIDAB in the last 72 hours.  Anemia Panel: No results for input(s): VITAMINB12, FOLATE, FERRITIN, TIBC, IRON, RETICCTPCT in the last 72 hours. Sepsis Labs: No results for input(s): PROCALCITON, LATICACIDVEN in the last 168 hours.  Recent Results (from the past 240 hour(s))  Respiratory Panel by RT PCR (Flu A&B, Covid) - Nasopharyngeal Swab     Status: None   Collection Time: 04/01/2020  8:42 PM   Specimen: Nasopharyngeal Swab  Result Value Ref Range Status   SARS Coronavirus 2 by RT PCR NEGATIVE NEGATIVE Final    Comment: (NOTE) SARS-CoV-2 target nucleic acids are NOT DETECTED. The SARS-CoV-2 RNA is generally detectable in upper respiratoy specimens during the acute phase of infection. The lowest concentration of SARS-CoV-2 viral copies this assay can detect is 131 copies/mL. A negative result does not preclude SARS-Cov-2 infection and should not be used as the sole basis for treatment or other patient management decisions. A negative result may occur with  improper  specimen collection/handling, submission of specimen other than nasopharyngeal swab, presence of viral mutation(s) within the areas targeted by this assay, and inadequate number of viral copies (<131 copies/mL). A negative result must be combined with clinical observations, patient history, and epidemiological information. The expected result is Negative. Fact Sheet for Patients:  PinkCheek.be Fact Sheet for Healthcare Providers:  GravelBags.it This test is not yet ap proved or cleared by the [place] FDA and  has been authorized for detection and/or diagnosis of SARS-CoV-2 by FDA under an Emergency Use Authorization (EUA). This EUA will remain  in effect (meaning this test can be used) for the duration of the COVID-19 declaration under Section 564(b)(1) of the Act, 21 U.S.C. section 360bbb-3(b)(1), unless the authorization is terminated or revoked sooner.    Influenza A by PCR NEGATIVE NEGATIVE Final   Influenza B by PCR NEGATIVE NEGATIVE Final    Comment: (NOTE) The Xpert Xpress SARS-CoV-2/FLU/RSV assay is intended as an aid in  the diagnosis of influenza from Nasopharyngeal swab specimens and  should not be used as a sole basis for treatment. Nasal washings and  aspirates are unacceptable for Xpert Xpress SARS-CoV-2/FLU/RSV  testing. Fact Sheet for Patients: PinkCheek.be Fact Sheet for Healthcare Providers: GravelBags.it This test is not yet approved or cleared by the [place] FDA and  has been authorized for detection and/or diagnosis of SARS-CoV-2 by  FDA under an Emergency Use Authorization (EUA). This EUA will remain  in effect (meaning this test can be used) for the duration of the  Covid-19 declaration under Section 564(b)(1) of the Act, 21  U.S.C. section 360bbb-3(b)(1), unless the authorization is  terminated or revoked. Performed at [facility], [address], [place], [place] [address]          Radiology Studies: CT Head Wo Contrast  Result Date: 04/13/2020 CLINICAL DATA:  Of the status change. EXAM: CT HEAD WITHOUT CONTRAST TECHNIQUE: Contiguous axial images were obtained from the base of the skull through the vertex without intravenous contrast. COMPARISON:  MRI dated June 05, 2019. FINDINGS: Brain: No evidence of acute infarction, hemorrhage, hydrocephalus, extra-axial collection or mass lesion/mass effect. Vascular: No hyperdense vessel or unexpected calcification. Skull: Normal. Negative for fracture or focal lesion. Sinuses/Orbits: No acute finding. Other: None. IMPRESSION: No acute intracranial pathology. Electronically Signed   By: Constance Holster M.D.   On: 03/29/2020 19:23   DG Chest Port 1 View  Result Date: 04/08/2020 CLINICAL DATA:  Shortness of breath. History of metastatic lung cancer. EXAM: PORTABLE CHEST 1 VIEW COMPARISON:  March 17, 2020.  FINDINGS: Stable cardiomediastinal silhouette is noted with central pulmonary vascular congestion and probable bilateral pulmonary edema. Bilateral pleural effusions are noted, right greater than left, with associated basilar atelectasis. Esophageal stent is unchanged. Left subclavian Port-A-Cath is unchanged. Bony thorax is unremarkable. No pneumothorax is noted. IMPRESSION: Stable bilateral pleural effusions, right greater than left, with associated basilar atelectasis. Central pulmonary vascular congestion and probable bilateral pulmonary edema. Electronically Signed   By: Marijo Conception M.D.   On: 04/03/2020 10:46        Scheduled Meds: . enoxaparin (LOVENOX) injection  40 mg Subcutaneous QHS  . feeding supplement (ENSURE ENLIVE)  237 mL Oral TID BM  . feeding supplement (PRO-STAT SUGAR FREE 64)  30 mL Oral TID  . furosemide  40 mg Intravenous Q12H  . levothyroxine  50 mcg Intravenous BH-q7a  . losartan  25 mg Oral Daily  . mouth rinse  15 mL Mouth Rinse BID  .  naloxegol oxalate  25 mg Oral Daily  . pantoprazole  40 mg Oral BID    LOS: 1 day    Time spent: 35 minutes    Lavere Stork Darleen Crocker, DO [facility]  If 7PM-7AM, please contact night-coverage [email or web address] 03/23/2020, 3:08 PM

## 2020-03-23 NOTE — Progress Notes (Signed)
Patient noted to be essentially unresponsive and is having some agonal breathing.  Family members are now in agreement that patient should be DNR and orders have been placed in our system.  They state that they would not want any chest compressions in the event of a cardiac arrest, nor would they want intubation or placement on life support.  Additionally, they are in agreement that comfort measures would be appropriate with pain and anxiety medications as needed to help ease any suffering as prognosis is now grave.  Life expectancy at this point appears to be hours to days.

## 2020-03-23 NOTE — Progress Notes (Signed)
Initial Nutrition Assessment  DOCUMENTATION CODES:      INTERVENTION:  Ensure Enlive po TID, each supplement provides 350 kcal and 20 grams of protein   ProStat 30 ml BID (each 30 ml provides 100 kcal, 15 gr protein)   Follow results of palliative evaluation/discussion   NUTRITION DIAGNOSIS:   Inadequate oral intake related to chronic illness(metastatic lung cancer, swallow problem) as evidenced by per patient/family report(liquid diet).   GOAL:  Patient will meet greater than or equal to 90% of their needs(if feasible and agreeable with patient goals of care)    MONITOR:  PO intake, Supplement acceptance, Labs, Skin, Weight trends, I & O's  REASON FOR ASSESSMENT:   Malnutrition Screening Tool    ASSESSMENT: Patient is a 74 yo with metastatic lung cancer, HTN, HLD, GERD, TIA. Recent hospitalization 4-22 unto 4-30 FTT, Dysphagia and is s/p esophageal stent 4/27.  She has been tolerating full liquid diet. Patient presents with altered mental status, worsening lower extremity edema.  Patient is resting during RD visit. Daughter present and provided nutrition intake hx. Patient has been drinking Ensure, Boost and has been taking some purred soups. No intake today per documentation. Expect she is malnourished.    Palliative team has been consulted. Will follow for goals of care.   Acute gain based on hospital records. Weight is 81.5 kg currently compared to April: 4/2-4/20 wt ranged  76.7 kg-> 74.3 kg. Gain of 9.6% (7.2 kg) < 1 month. Patient had lower extremity swelling on admission and daughter reports abdominal gain as well.  Labs reviewed: 5/5-Sodium 129 (L), BUN 24, Glucose 109. (5/4) BNP-415 (H),  Hgb. 8.2 (L)  Medications reviewed and include: Lasix, Movantik, Protonix.   NUTRITION - FOCUSED PHYSICAL EXAM:  Unable to complete Nutrition-Focused physical exam at this time.    Diet Order:   Diet Order            Diet full liquid Room service appropriate? Yes; Fluid  consistency: Thin  Diet effective now              EDUCATION NEEDS:  Education needs have been addressed Skin:  Skin Assessment: Reviewed RN Assessment  Last BM:  5/4- abdomen distended per nursing - [takes movantik]  Height:   Ht Readings from Last 1 Encounters:  03/19/2020 5\' 6"  (1.676 m)    Weight:   Wt Readings from Last 1 Encounters:  03/23/20 81.5 kg    Ideal Body Weight:   59 kg  BMI:  Body mass index is 29 kg/m.  Estimated Nutritional Needs:   Kcal:  9485-4627 (MSJ x 1.2-1.3)  Protein:  83-89 (1.3-1.5 gr/kg/IBW)  Fluid:  1.6-1.7 liters daily   Colman Cater MS,RD,CSG,LDN Pager: # Shea [phone]

## 2020-03-24 ENCOUNTER — Other Ambulatory Visit: Payer: Self-pay | Admitting: Internal Medicine

## 2020-03-24 LAB — URINE CULTURE: Culture: NO GROWTH

## 2020-03-30 ENCOUNTER — Other Ambulatory Visit (HOSPITAL_COMMUNITY): Payer: Medicare PPO

## 2020-03-30 ENCOUNTER — Ambulatory Visit: Payer: Medicare PPO | Admitting: "Endocrinology

## 2020-03-30 ENCOUNTER — Ambulatory Visit (HOSPITAL_COMMUNITY): Payer: Medicare PPO | Admitting: Hematology

## 2020-03-31 ENCOUNTER — Ambulatory Visit: Payer: Self-pay | Admitting: *Deleted

## 2020-04-12 ENCOUNTER — Other Ambulatory Visit (HOSPITAL_COMMUNITY): Payer: Medicare PPO

## 2020-04-12 ENCOUNTER — Ambulatory Visit (HOSPITAL_COMMUNITY): Payer: Medicare PPO

## 2020-04-12 ENCOUNTER — Ambulatory Visit (HOSPITAL_COMMUNITY): Payer: Medicare PPO | Admitting: Hematology

## 2020-04-19 NOTE — Death Summary Note (Signed)
Physician Discharge Summary  Lindsey Mullins ALP:[id number] DOB: 05-25-1946 DOA: 20-Apr-2020  PCP: Lindsey Rossetti, MD  Admit date: 20-Apr-2020  Death date: 04/22/2020 0040  Admitted From:Home  Disposition:  Expired  Brief/Interim Summary: Per HPI: Lindsey Chris Perkinsis a 74 y.o.femalewith medical history significant formetastatic lung cancer, hypertension, hypothyroidism. Patient was brought to the ED with complaints of altered mental status-drowsiness, confusion. Patient wasseen by her oncologist today, she was noted to be drowsy, with O2 sats 83% on room air improved with 2 L nasal cannula. Patient had taking Dilaudid 1 mg tablet about 20 minutes prior to coming to the office. Dose of Dilaudid was adjusted on recent hospitalization from 0.25 mg to 1 mg of Dilaudid every 3 hours. Blood work was obtained, portable chest x-ray also. IV Lasix 20 mg x 1 was given,with albumin. With concern for opioid overdose, patient was sent to the ED. At the time of my evaluation status post Narcan, patient is awake but appears lethargic,she reports pain with urination over the past 3 to 4 days. She has had increased difficulty breathing over the past week. Daughter also reports worsening lower extremity swelling, that has improved to the due to the compression stockings patient is wearing and her legs have been elevated today. No chest pain.  Patient was admitted with multifactorial encephalopathy likely secondary to use of narcotics as well as possible UTI and some hypoxemia with noted pulmonary edema.  She was also noted to have electrolyte disturbances of hyponatremia and hypokalemia.  5/5: Patient continued to have ongoing somnolence with altered mentation and is requiring nasal cannula oxygen.  Family discussion was had and they have elected for patient to be DNR as she appears to be unresponsive and would not have any significant benefit from aggressive care at this point.  She was also made  comfort measures.  Overnight she became more bradycardic until she was noted to be in asystole.  Time of death was 0040 on 04-22-20.  Discharge Diagnoses:  Principal Problem:   AMS (altered mental status) Active Problems:   Essential hypertension, benign   Squamous cell carcinoma of lung, stage IV, right (HCC)   Esophageal stenosis   Acute metabolic encephalopathy   Discharge Instructions   Allergies as of 04/22/2020      Reactions   Penicillins Shortness Of Breath, Other (See Comments)   Has patient had a PCN reaction causing immediate rash, facial/tongue/throat swelling, SOB or lightheadedness with hypotension: Yes Has patient had a PCN reaction causing severe rash involving mucus membranes or skin necrosis: Unk Has patient had a PCN reaction that required hospitalization: Unk Has patient had a PCN reaction occurring within the last 10 years: Yes "States that she was in ICU after being administered"   Codeine Nausea And Vomiting   Lipitor [atorvastatin] Other (See Comments)   Myalgias   Lisinopril Cough   Pravastatin Other (See Comments)   Myalgias      Medication List    ASK your doctor about these medications   ALPRAZolam 0.25 MG tablet Commonly known as: XANAX Take 1 tablet (0.25 mg total) by mouth at bedtime as needed for anxiety.   alum & mag hydroxide-simeth 200-200-20 MG/5ML suspension Commonly known as: MAALOX/MYLANTA Take 30 mLs by mouth every 4 (four) hours as needed for indigestion.   HYDROmorphone 2 MG tablet Commonly known as: DILAUDID Take 0.5 tablets (1 mg total) by mouth every 4 (four) hours as needed for severe pain.   levothyroxine 75 MCG tablet Commonly known as:  SYNTHROID Take 1 tablet (75 mcg total) by mouth daily before breakfast.   losartan 50 MG tablet Commonly known as: COZAAR Take 25 mg by mouth daily.   naloxegol oxalate 25 MG Tabs tablet Commonly known as: Movantik Take 1 tablet (25 mg total) by mouth daily.   nitroGLYCERIN 0.4 MG  SL tablet Commonly known as: Nitrostat DISSOLVE ONE TABLET UNDER THE TONGUE AS NEEDED FOR CHEST PAIN**MAX 3 TABLETS EACH 5 MINUTES APART**   ondansetron 4 MG disintegrating tablet Commonly known as: Zofran ODT Take 1 tablet (4 mg total) by mouth every 8 (eight) hours as needed for nausea or vomiting.   pantoprazole 40 MG tablet Commonly known as: PROTONIX Take 1 tablet (40 mg total) by mouth 2 (two) times daily.   rosuvastatin 20 MG tablet Commonly known as: CRESTOR Take 20 mg by mouth daily.       Allergies  Allergen Reactions  . Penicillins Shortness Of Breath and Other (See Comments)    Has patient had a PCN reaction causing immediate rash, facial/tongue/throat swelling, SOB or lightheadedness with hypotension: Yes Has patient had a PCN reaction causing severe rash involving mucus membranes or skin necrosis: Unk Has patient had a PCN reaction that required hospitalization: Unk Has patient had a PCN reaction occurring within the last 10 years: Yes "States that she was in ICU after being administered"   . Codeine Nausea And Vomiting  . Lipitor [Atorvastatin] Other (See Comments)    Myalgias  . Lisinopril Cough  . Pravastatin Other (See Comments)    Myalgias    Consultations:  None   Procedures/Studies: DG Neck Soft Tissue  Result Date: 03/15/2020 CLINICAL DATA:  74 year old female with chest pain and crepitus after EGD and esophageal stent placement. EXAM: NECK SOFT TISSUES - 1+ VIEW COMPARISON:  Neck radiographs 03/10/2008. FINDINGS: Portable lateral view of the neck. Normal prevertebral soft tissue contour. Hypopharynx and epiglottis contours appear normal. No abnormal soft tissue gas identified on this lateral view. Absent dentition. Mild for age cervical disc and endplate degeneration. No acute osseous abnormality identified. Evidence of calcified carotid atherosclerosis at the bifurcations. IMPRESSION: No subcutaneous gas or acute findings identified on portable  lateral view of the neck. Electronically Signed   By: Genevie Ann M.D.   On: 03/15/2020 14:50   CT Head Wo Contrast  Result Date: 04/16/2020 CLINICAL DATA:  Of the status change. EXAM: CT HEAD WITHOUT CONTRAST TECHNIQUE: Contiguous axial images were obtained from the base of the skull through the vertex without intravenous contrast. COMPARISON:  MRI dated June 05, 2019. FINDINGS: Brain: No evidence of acute infarction, hemorrhage, hydrocephalus, extra-axial collection or mass lesion/mass effect. Vascular: No hyperdense vessel or unexpected calcification. Skull: Normal. Negative for fracture or focal lesion. Sinuses/Orbits: No acute finding. Other: None. IMPRESSION: No acute intracranial pathology. Electronically Signed   By: Constance Holster M.D.   On: 04/07/2020 19:23   CT CHEST W CONTRAST  Result Date: 03/04/2020 CLINICAL DATA:  Non-small cell lung cancer, post first-line therapy, assessment for treatment response. EXAM: CT CHEST WITH CONTRAST TECHNIQUE: Multidetector CT imaging of the chest was performed during intravenous contrast administration. CONTRAST:  64mL OMNIPAQUE IOHEXOL 300 MG/ML  SOLN COMPARISON:  10/26/2019 FINDINGS: Cardiovascular: Coronary, aortic arch, and branch vessel atherosclerotic vascular disease. Moderate pericardial effusion, primarily accumulated posteriorly, new compared to prior. Mediastinum/Nodes: Compared to prior chest CT, there is significantly worsened bilateral neck level IV adenopathy, left subpectoral adenopathy, prevascular adenopathy, AP window adenopathy, right internal mammary, bilateral hilar adenopathy, paratracheal adenopathy,  and subcarinal adenopathy. Index AP window lymph node 1.4 cm in short axis on image 58/2, previously 0.4 cm. Index prevascular node 1.2 cm in short axis on image 50/2, new. Index left level IV lymph node 2.1 cm in short axis on image 18/2, new. Index subpectoral lymph node on the left, 1.1 cm on image 33/2, previously 0.5 cm. Index left hilar  node 1.6 cm in short axis on image 72/2. The subcarinal adenopathy is difficult to separate from the esophagus. This measures about 2.3 cm in short axis on image 73/2 (formerly 1.0 cm), and invasion of the esophageal wall is not excluded given the lack of fat planes between this somewhat irregular adenopathy and the esophagus. An underlying esophageal mass is not excluded. Esophagus above this level is mildly dilated by fluid, but at this level adopts only soft tissue density. This extends along an approximately 5.7 cm segment of the mid to distal esophagus. Lungs/Pleura: Small to moderate right pleural effusion nonspecific for transudative, exudative, or malignant etiology. Mild interstitial accentuation bilaterally with secondary pulmonary lobular thickening and airway thickening. In the right lower lobe, there is an irregular band of density much of which is likely volume loss although underlying tumor is difficult to confidently exclude in this setting. This extends to the infrahilar adenopathy. Calcified pleural plaque on the left. Mild paraseptal emphysema. New 5 mm right upper lobe pulmonary nodule on image 39/4. New 5 by 4 mm right middle lobe pulmonary nodule on image 112/4. Upper Abdomen: Considerable upper abdominal ascites. New and enlarging hepatic metastatic lesions including a new 1.0 cm hypodense lesion posteriorly in the right hepatic lobe on image 118/2. A hypodense lesion in the right hepatic lobe on image 126/2 measures 1.8 by 1.8 cm, previously about 0.8 cm in diameter. A hypodense lesion in segment 4 of the liver previously poorly seen measures 1.2 by 1.0 cm on image 143/2. A complex splenic lesion favoring metastatic lesion has enlarged, currently 3.0 by 2.2 cm, previously 1.3 by 1.1 cm. Nonspecific fullness of the visualized portion of the left adrenal gland appears increased from 10/26/2019 early adrenal metastatic lesion not excluded. Mildly prominent right gastric lymph nodes are present.  Musculoskeletal: Scattered sclerotic lesions in the thoracic spine are present, most strikingly at the T5 level as on the prior MRI from 01/15/2020. MRI better depicts these lesions superior to CT. There is a faint sclerotic lesion in the sternal body measuring 1.1 cm craniocaudad on image 100/6. IMPRESSION: 1. Significant progression of adenopathy especially in the bilateral neck level IV, left subpectoral, bilateral hilar, paratracheal, subcarinal, and subcarinal regions. Possible invasion of the esophageal wall by tumor in the mid to distal esophagus. 2. Small to moderate right pleural effusion nonspecific for transudative, exudative, or malignant etiology. 3. New and enlarging hepatic and splenic metastatic lesions. 4. New 5 mm right upper lobe and 5 by 4 mm right middle lobe pulmonary nodules, nonspecific for metastatic lesions. 5. Considerable upper abdominal ascites. 6. Coronary, aortic arch, and branch vessel atherosclerotic vascular disease. 7. Calcified pleural plaque on the left, query prior asbestos exposure. 8. Aortic atherosclerosis. 9. Nonspecific fullness of the visualized portion of the left adrenal gland appears increased from 10/26/2019 early adrenal metastatic lesion not excluded. 10. Scattered sclerotic lesions in the thoracic spine, most strikingly at the T5 level as on the prior MRI from 01/15/2020. MRI better depicts these lesions. Aortic Atherosclerosis (ICD10-I70.0) and Emphysema (ICD10-J43.9). Electronically Signed   By: Van Clines M.D.   On: 03/04/2020 18:44  CT ABDOMEN PELVIS W CONTRAST  Result Date: 03/06/2020 CLINICAL DATA:  Abdominal distension. Status post EGD 2 days ago. Pain. EXAM: CT ABDOMEN AND PELVIS WITH CONTRAST TECHNIQUE: Multidetector CT imaging of the abdomen and pelvis was performed using the standard protocol following bolus administration of intravenous contrast. CONTRAST:  181mL OMNIPAQUE IOHEXOL 300 MG/ML  SOLN COMPARISON:  Chest CT 2 days ago 03/04/2020.  Abdominal CT 10/26/2019 FINDINGS: Lower chest: Right pleural effusion is unchanged from recent CT. Bandlike consolidation in the right lower lobe is unchanged. Heart is normal in size. There are coronary artery calcifications. Small pericardial effusion measuring up to 8 mm. Distal esophageal wall thickening. No paraesophageal stranding. No extraluminal air. Hepatobiliary: Multiple hypodense hepatic lesions consistent with metastatic disease. Largest lesion in the right lobe measures 18 mm. Distended gallbladder without calcified gallstone. Common bile duct is not well-defined, no evidence of biliary dilatation. Pancreas: Parenchymal atrophy. No ductal dilatation or inflammation. Spleen: Irregular hypodense lesion in the inferior spleen measures 2.5 cm, increased in size from prior. No splenomegaly. Adrenals/Urinary Tract: Normal right adrenal gland. Minimal left adrenal thickening without dominant nodule. No hydronephrosis or perinephric edema. Homogeneous renal enhancement with symmetric excretion on delayed phase imaging. Scattered cortical scarring in the right kidney. Urinary bladder is physiologically distended without wall thickening. Mild high density of the urine in the urinary bladder may be related to some residual excretion of IV contrast from recent chest CT. Stomach/Bowel: Mild wall thickening of the distal esophagus. No adjacent inflammation or extraluminal air. The stomach is nondistended. No small bowel obstruction, small bowel is decompressed. The appendix is not definitively visualized, no evidence of appendicitis. Large volume of stool in the ascending and proximal transverse colon. Transverse colon is tortuous. Distal transverse and descending colon are decompressed. No colonic wall thickening. Vascular/Lymphatic: Moderate aorto bi-iliac atherosclerosis. Portal caval adenopathy, including 11 mm node series 2, image 32, new from prior abdominal CT. Increased number of multiple small lower  retroperitoneal nodes, largest measures 8 mm, nonspecific. 8 mm right external iliac/inguinal node is nonspecific. Portal vein is patent. Reproductive: Multiple calcified uterine fibroids. Ascites limits assessment for adnexal mass. Ovaries tentatively visualized and quiescent. Other: Moderate volume abdominopelvic ascites. There is omental caking in the left upper quadrant and anterior abdomen most prominent just below the umbilicus, series 2, image 61. No free air. Musculoskeletal: Sclerotic osseous metastatic disease throughout the lumbar spine. Chronic compression deformity superior endplate of L2. Prominent Schmorl's node inferior endplate of L3. Patchy sclerosis involving the left proximal femur and bony pelvis. IMPRESSION: 1. No evidence of perforation post EGD.  No bowel obstruction. 2. Moderate volume abdominopelvic ascites. There is omental caking in the left upper quadrant and anterior abdomen. This is new from in December 2020 abdominal CT. 3. Increased size of hepatic metastatic disease. Increased size of hypodense splenic lesion suspicious for metastasis. Increased size of periportal and retroperitoneal nodes, thickening of the left adrenal gland, nonspecific but increased from prior exam. 4. Sclerotic osseous metastatic disease throughout the lumbar spine and bony pelvis. 5. Moderate stool in the ascending and proximal transverse colon. 6. Right pleural effusion is unchanged from recent chest CT 2 days ago. Bandlike consolidation in the right lower lobe is unchanged. Aortic Atherosclerosis (ICD10-I70.0). Electronically Signed   By: Keith Rake M.D.   On: 03/06/2020 22:54   DG Chest Port 1 View  Result Date: 04/11/2020 CLINICAL DATA:  Shortness of breath. History of metastatic lung cancer. EXAM: PORTABLE CHEST 1 VIEW COMPARISON:  March 17, 2020. FINDINGS:  Stable cardiomediastinal silhouette is noted with central pulmonary vascular congestion and probable bilateral pulmonary edema. Bilateral  pleural effusions are noted, right greater than left, with associated basilar atelectasis. Esophageal stent is unchanged. Left subclavian Port-A-Cath is unchanged. Bony thorax is unremarkable. No pneumothorax is noted. IMPRESSION: Stable bilateral pleural effusions, right greater than left, with associated basilar atelectasis. Central pulmonary vascular congestion and probable bilateral pulmonary edema. Electronically Signed   By: Marijo Conception M.D.   On: 04/07/2020 10:46   DG CHEST PORT 1 VIEW  Result Date: 03/17/2020 CLINICAL DATA:  Hypoxia. EXAM: PORTABLE CHEST 1 VIEW COMPARISON:  03/15/2020 FINDINGS: There is a left chest wall port a catheter with tip at the SVC. Esophageal stent appears unchanged in position. Bilateral pleural effusions are identified, right greater than left. Mild diffuse interstitial edema. IMPRESSION: Bilateral pleural effusions and mild interstitial edema. Unchanged from previous exam. Electronically Signed   By: Kerby Moors M.D.   On: 03/17/2020 09:47   DG CHEST PORT 1 VIEW  Result Date: 03/15/2020 CLINICAL DATA:  Post esophageal stent placement. EXAM: PORTABLE CHEST 1 VIEW COMPARISON:  03/06/2020 FINDINGS: Left subclavian Port-A-Cath unchanged with tip over the SVC. Interval placement of esophageal stent over the mid to lower esophagus appears in adequate position. Lungs are adequately inflated demonstrate slight interval worsening of opacification over the right mid to lower lung likely small to moderate effusion with associated basilar atelectasis. Possible small amount of left basilar pleural fluid/atelectasis. Mild hazy prominence of the perihilar vessels suggesting mild vascular congestion. Cardiomediastinal silhouette and remainder of the exam is unchanged. IMPRESSION: 1. Findings suggesting minimal vascular congestion with slight worsening opacification over the right mid to lower lung likely effusion with associated atelectasis. Minimal left base opacification likely  effusion/atelectasis. 2. Interval placement of esophageal stent over the mid to lower esophagus. Left subclavian Port-A-Cath unchanged. Electronically Signed   By: Marin Olp M.D.   On: 03/15/2020 14:46   DG Chest Portable 1 View  Result Date: 03/06/2020 CLINICAL DATA:  Epigastric pain. EGD 2 days ago. Evaluate for free air under the diaphragm. EXAM: PORTABLE CHEST 1 VIEW COMPARISON:  Chest CT 2 days ago 03/04/2020 FINDINGS: No evidence of free air under the hemidiaphragms. No visualized pneumomediastinum. Left chest port remains in place, tip in the SVC. No pneumothorax. Pleural effusion and right basilar opacity similar to recent chest CT. Normal heart size with unchanged mediastinal contours. Aortic atherosclerosis. No pulmonary edema. IMPRESSION: 1. No evidence of free air under the hemidiaphragms. No visualized pneumomediastinum. 2. Right basilar opacity and pleural effusion similar to recent chest CT. Electronically Signed   By: Keith Rake M.D.   On: 03/06/2020 21:27   DG Abd Portable 1V  Result Date: 03/15/2020 CLINICAL DATA:  Esophageal stent placement EXAM: PORTABLE ABDOMEN - 1 VIEW COMPARISON:  03/06/2020 FINDINGS: Partially visualized stent material at the level of the GE junction. The bowel gas pattern is nonobstructive. No radio-opaque calculi or other significant radiographic abnormality are seen. IMPRESSION: 1. Partially visualized stent material at the level of the GE junction. 2. Nonobstructive bowel gas pattern. Electronically Signed   By: Davina Poke D.O.   On: 03/15/2020 14:47   DG Abd Portable 1 View  Result Date: 03/06/2020 CLINICAL DATA:  Evaluate for free air. Status post EGD 2 days ago with epigastric pain. EXAM: PORTABLE ABDOMEN - 1 VIEW COMPARISON:  None. FINDINGS: Portable AP upright view of the abdomen. No free air under the hemidiaphragms. No bowel dilatation to suggest obstruction. Moderate stool  in the right colon. IMPRESSION: No free air under the  hemidiaphragms. Nonobstructive bowel gas pattern. Electronically Signed   By: Keith Rake M.D.   On: 03/06/2020 21:28   DG Esophagus Dilatation  Result Date: 03/15/2020 ESOPHAGEAL DILATATION: Fluoroscopy was provided for use by the requesting physician.  No images were obtained for radiographic interpretation.    The results of significant diagnostics from this hospitalization (including imaging, microbiology, ancillary and laboratory) are listed below for reference.     Microbiology: Recent Results (from the past 240 hour(s))  Respiratory Panel by RT PCR (Flu A&B, Covid) - Nasopharyngeal Swab     Status: None   Collection Time: 04/08/2020  8:42 PM   Specimen: Nasopharyngeal Swab  Result Value Ref Range Status   SARS Coronavirus 2 by RT PCR NEGATIVE NEGATIVE Final    Comment: (NOTE) SARS-CoV-2 target nucleic acids are NOT DETECTED. The SARS-CoV-2 RNA is generally detectable in upper respiratoy specimens during the acute phase of infection. The lowest concentration of SARS-CoV-2 viral copies this assay can detect is 131 copies/mL. A negative result does not preclude SARS-Cov-2 infection and should not be used as the sole basis for treatment or other patient management decisions. A negative result may occur with  improper specimen collection/handling, submission of specimen other than nasopharyngeal swab, presence of viral mutation(s) within the areas targeted by this assay, and inadequate number of viral copies (<131 copies/mL). A negative result must be combined with clinical observations, patient history, and epidemiological information. The expected result is Negative. Fact Sheet for Patients:  PinkCheek.be Fact Sheet for Healthcare Providers:  GravelBags.it This test is not yet ap proved or cleared by the [place] FDA and  has been authorized for detection and/or diagnosis of SARS-CoV-2 by FDA under an Emergency  Use Authorization (EUA). This EUA will remain  in effect (meaning this test can be used) for the duration of the COVID-19 declaration under Section 564(b)(1) of the Act, 21 U.S.C. section 360bbb-3(b)(1), unless the authorization is terminated or revoked sooner.    Influenza A by PCR NEGATIVE NEGATIVE Final   Influenza B by PCR NEGATIVE NEGATIVE Final    Comment: (NOTE) The Xpert Xpress SARS-CoV-2/FLU/RSV assay is intended as an aid in  the diagnosis of influenza from Nasopharyngeal swab specimens and  should not be used as a sole basis for treatment. Nasal washings and  aspirates are unacceptable for Xpert Xpress SARS-CoV-2/FLU/RSV  testing. Fact Sheet for Patients: PinkCheek.be Fact Sheet for Healthcare Providers: GravelBags.it This test is not yet approved or cleared by the [place] FDA and  has been authorized for detection and/or diagnosis of SARS-CoV-2 by  FDA under an Emergency Use Authorization (EUA). This EUA will remain  in effect (meaning this test can be used) for the duration of the  Covid-19 declaration under Section 564(b)(1) of the Act, 21  U.S.C. section 360bbb-3(b)(1), unless the authorization is  terminated or revoked. Performed at [facility] [address] [place], [place] [address]   Urine culture     Status: None   Collection Time: 03/23/20 11:00 AM   Specimen: Urine, Clean Catch  Result Value Ref Range Status   Specimen Description   Final    URINE, CLEAN CATCH Performed at [facility], [address] [place], [place] [address]    Special Requests   Final    NONE Performed at [facility], [address], [place], [place] [address]    Culture   Final    NO GROWTH Performed at  [facility], [place] [address], [place], [place] [address]    Report Status 03/28/20 FINAL  Final     Labs: BNP (last 3 results) Recent Labs    04/16/2020 1658  BNP 923.3*   Basic Metabolic  Panel: Recent Labs  Lab 03/18/20 0451 03/19/2020 1116 04/06/2020 1658 03/23/20 0613  NA 133* 128* 130* 129*  K 3.8 3.2* 3.1* 3.8  CL 96* 82* 86* 89*  CO2 27 30 33* 33*  GLUCOSE 112* 120* 113* 109*  BUN <5* 22 23 24*  CREATININE 0.55 0.71 0.80 0.79  CALCIUM 8.1* 8.2* 8.1* 8.0*  MG 1.7 2.1  --   --   PHOS 2.9  --   --   --    Liver Function Tests: Recent Labs  Lab 03/18/20 0451 03/31/2020 1116 03/29/2020 1658  AST 44* 47* 39  ALT 13 16 14   ALKPHOS 110 157* 122  BILITOT 0.8 0.7 1.0  PROT 5.8* 6.2* 6.3*  ALBUMIN 2.4* 2.8* 3.9   No results for input(s): LIPASE, AMYLASE in the last 168 hours. No results for input(s): AMMONIA in the last 168 hours. CBC: Recent Labs  Lab 03/18/20 0451 04/15/2020 1116 03/25/2020 1658  WBC 8.9 10.0 9.5  NEUTROABS  --  8.3* 7.8*  HGB 8.8* 8.9* 8.2*  HCT 28.5* 28.3* 26.4*  MCV 99.0 96.9 98.5  PLT 333 299 292   Cardiac Enzymes: No results for input(s): CKTOTAL, CKMB, CKMBINDEX, TROPONINI in the last 168 hours. BNP: Invalid input(s): POCBNP CBG: Recent Labs  Lab 04/03/2020 1606  GLUCAP 100*   D-Dimer No results for input(s): DDIMER in the last 72 hours. Hgb A1c No results for input(s): HGBA1C in the last 72 hours. Lipid Profile No results for input(s): CHOL, HDL, LDLCALC, TRIG, CHOLHDL, LDLDIRECT in the last 72 hours. Thyroid function studies No results for input(s): TSH, T4TOTAL, T3FREE, THYROIDAB in the last 72 hours.  Invalid input(s): FREET3 Anemia work up No results for input(s): VITAMINB12, FOLATE, FERRITIN, TIBC, IRON, RETICCTPCT in the last 72 hours. Urinalysis    Component Value Date/Time   COLORURINE YELLOW 04/13/2020 1651   APPEARANCEUR HAZY (A) 04/14/2020 1651   LABSPEC 1.012 04/08/2020 1651   PHURINE 5.0 03/21/2020 1651   GLUCOSEU NEGATIVE 04/11/2020 1651   HGBUR SMALL (A) 03/21/2020 1651   BILIRUBINUR NEGATIVE 04/07/2020 1651   KETONESUR NEGATIVE 03/20/2020 1651   PROTEINUR 30 (A) 03/27/2020 1651   UROBILINOGEN 0.2  11/30/2013 0811   NITRITE NEGATIVE 04/02/2020 1651   LEUKOCYTESUR TRACE (A) 04/10/2020 1651   Sepsis Labs Invalid input(s): PROCALCITONIN,  WBC,  LACTICIDVEN Microbiology Recent Results (from the past 240 hour(s))  Respiratory Panel by RT PCR (Flu A&B, Covid) - Nasopharyngeal Swab     Status: None   Collection Time: 04/15/2020  8:42 PM   Specimen: Nasopharyngeal Swab  Result Value Ref Range Status   SARS Coronavirus 2 by RT PCR NEGATIVE NEGATIVE Final    Comment: (NOTE) SARS-CoV-2 target nucleic acids are NOT DETECTED. The SARS-CoV-2 RNA is generally detectable in upper respiratoy specimens during the acute phase of infection. The lowest concentration of SARS-CoV-2 viral copies this assay can detect is 131 copies/mL. A negative result does not preclude SARS-Cov-2 infection and should not be used as the sole basis for treatment or other patient management decisions. A negative result may occur with  improper specimen collection/handling, submission of specimen other than nasopharyngeal swab, presence of viral mutation(s) within the areas targeted by this assay, and inadequate number of viral copies (<  131 copies/mL). A negative result must be combined with clinical observations, patient history, and epidemiological information. The expected result is Negative. Fact Sheet for Patients:  PinkCheek.be Fact Sheet for Healthcare Providers:  GravelBags.it This test is not yet ap proved or cleared by the [place] FDA and  has been authorized for detection and/or diagnosis of SARS-CoV-2 by FDA under an Emergency Use Authorization (EUA). This EUA will remain  in effect (meaning this test can be used) for the duration of the COVID-19 declaration under Section 564(b)(1) of the Act, 21 U.S.C. section 360bbb-3(b)(1), unless the authorization is terminated or revoked sooner.    Influenza A by PCR NEGATIVE NEGATIVE Final   Influenza B  by PCR NEGATIVE NEGATIVE Final    Comment: (NOTE) The Xpert Xpress SARS-CoV-2/FLU/RSV assay is intended as an aid in  the diagnosis of influenza from Nasopharyngeal swab specimens and  should not be used as a sole basis for treatment. Nasal washings and  aspirates are unacceptable for Xpert Xpress SARS-CoV-2/FLU/RSV  testing. Fact Sheet for Patients: PinkCheek.be Fact Sheet for Healthcare Providers: GravelBags.it This test is not yet approved or cleared by the [place] FDA and  has been authorized for detection and/or diagnosis of SARS-CoV-2 by  FDA under an Emergency Use Authorization (EUA). This EUA will remain  in effect (meaning this test can be used) for the duration of the  Covid-19 declaration under Section 564(b)(1) of the Act, 21  U.S.C. section 360bbb-3(b)(1), unless the authorization is  terminated or revoked. Performed at [facility], [address], [place], [place] [address]   Urine culture     Status: None   Collection Time: 03/23/20 11:00 AM   Specimen: Urine, Clean Catch  Result Value Ref Range Status   Specimen Description   Final    URINE, CLEAN CATCH Performed at [facility], [address] [place], [place] [address]    Special Requests   Final    NONE Performed at [facility], [address], [place], [place] [address]    Culture   Final    NO GROWTH Performed at [facility], [place] [address] [place], [place] [address]    Report Status 04/09/20 FINAL  Final     Time coordinating discharge: 35 minutes  SIGNED:   Rodena Goldmann, DO [facility]  If 7PM-7AM, please contact night-coverage [email or web address]

## 2020-04-19 NOTE — Progress Notes (Signed)
  Patient hospitalize at the time of my scheduled home visit. Our office will reach out and reschedule after patient discharge.  Violeta Gelinas NP [phone]

## 2020-04-19 DEATH — deceased

## 2020-04-21 ENCOUNTER — Ambulatory Visit (INDEPENDENT_AMBULATORY_CARE_PROVIDER_SITE_OTHER): Payer: Medicare PPO | Admitting: Gastroenterology

## 2021-07-22 IMAGING — CR PORTABLE CHEST - 1 VIEW
2 series · 4 of 4 positions shown · non-contrast
Comparison: PET-CT 05/08/2019.

CLINICAL DATA: Port-A-Cath placement.

EXAM:
PORTABLE CHEST 1 VIEW

[Series 1: portable · 0.17mm/px · 2 of 2 slices shown (1 of 2)]
[im 1/2]
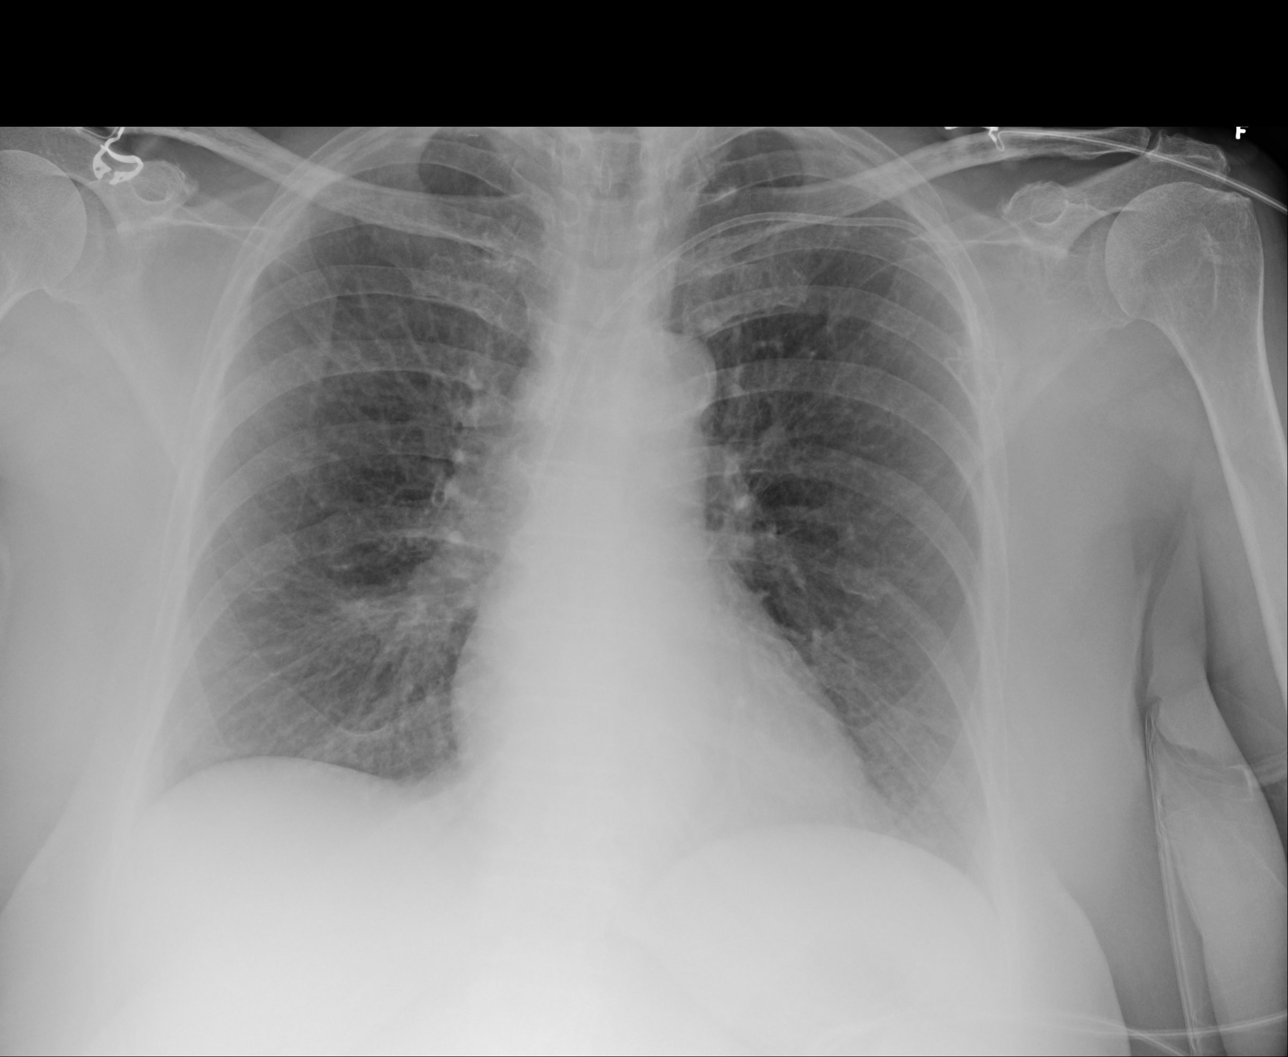
[im 2/2]
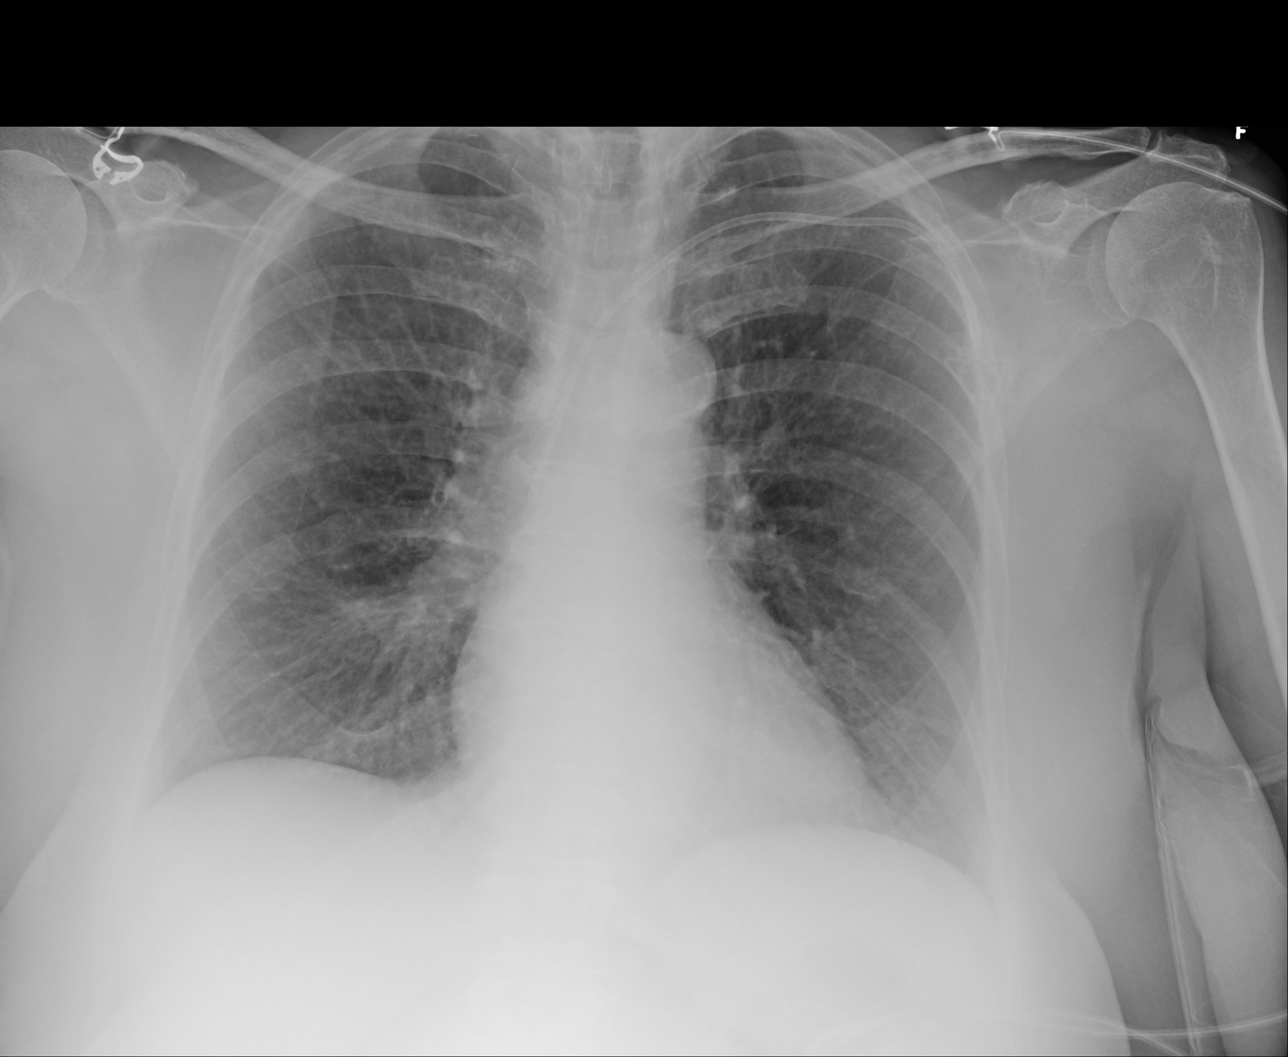

[Series 3: portable · 0.17mm/px · 2 of 2 slices shown (2 of 2)]
[im 1/2]
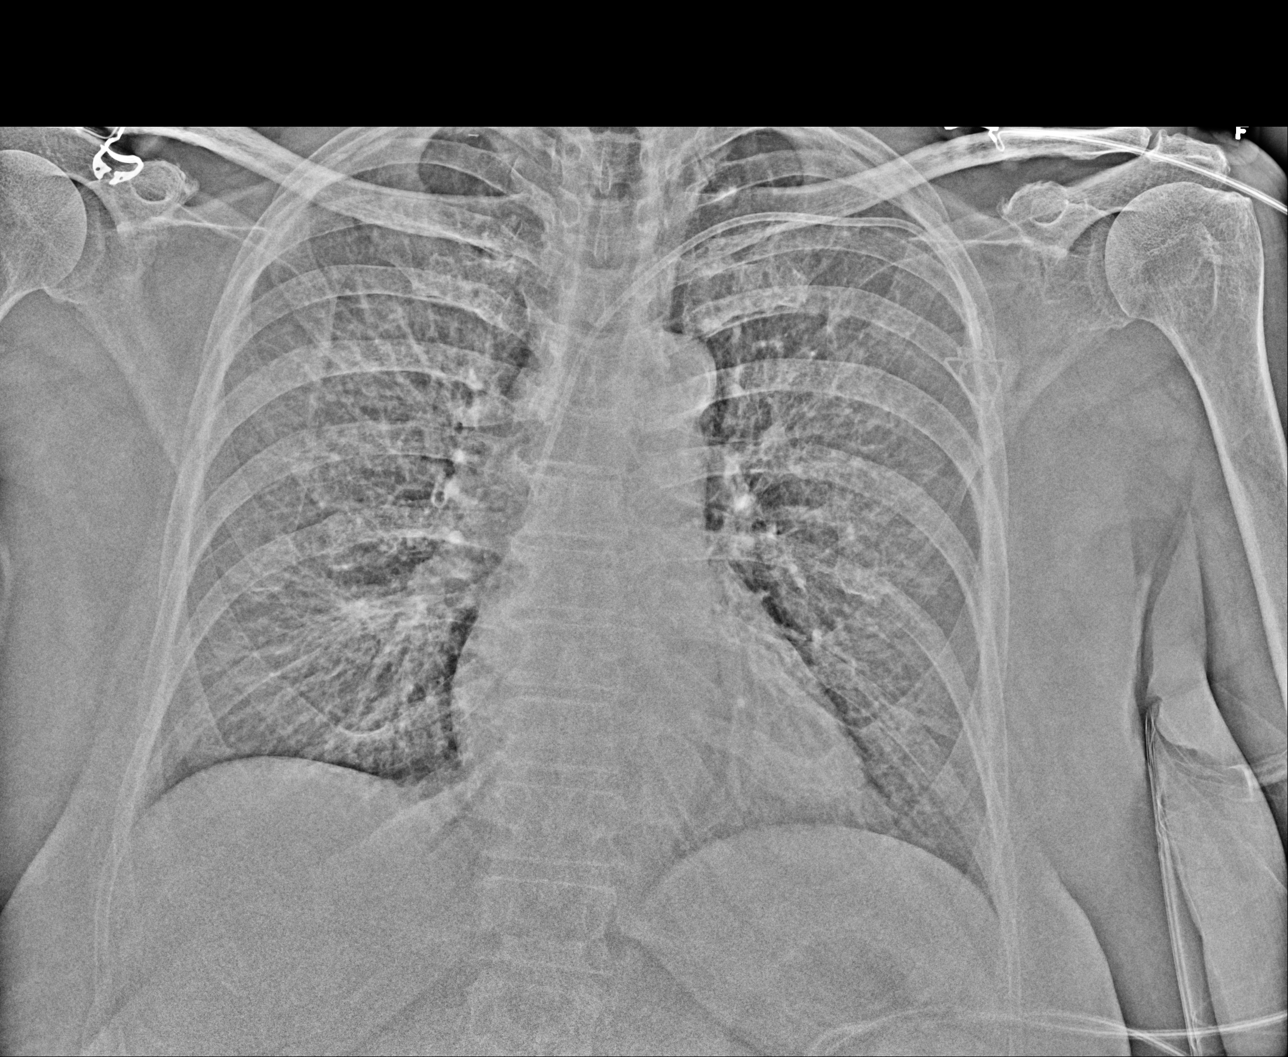
[im 2/2]
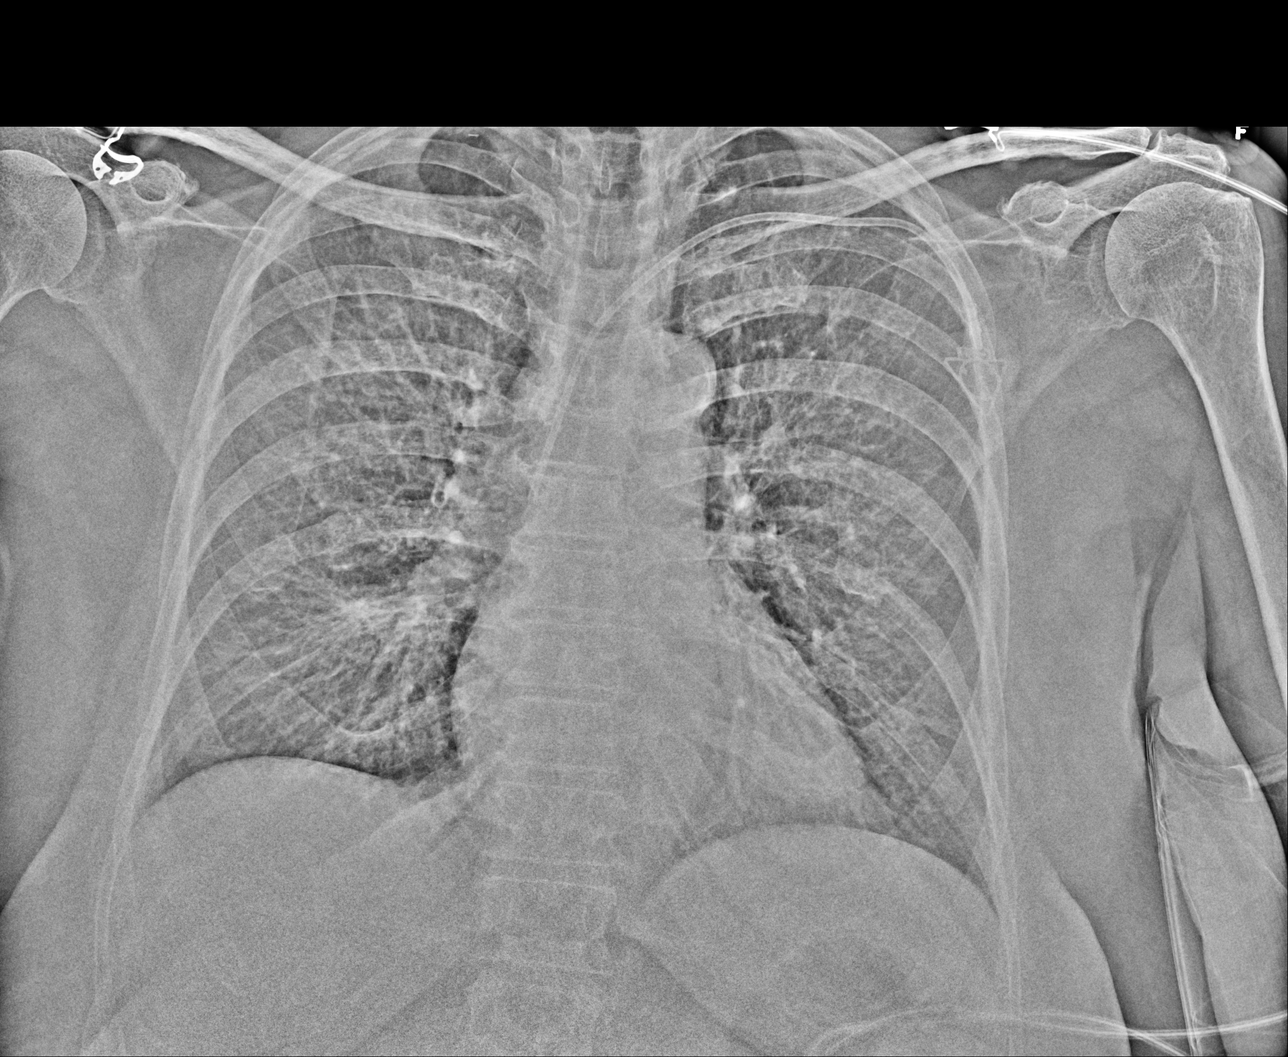

[4 of 4 positions shown; findings below may reference images not displayed]

FINDINGS: PowerPort noted with tip over superior vena cava. Heart size normal.
Mild right infrahilar infiltrate cannot be excluded. Pneumonia could
present in this fashion. Interstitial tumor spread could present in
this fashion. No pleural effusion or pneumothorax. Heart size
normal. No acute bony abnormality.
IMPRESSION: Mild right infrahilar infiltrate cannot be excluded. Pneumonia could
present in this fashion. Interstitial tumor spread could present in
this fashion.

## 2021-07-27 IMAGING — CR PORTABLE ABDOMEN - 2 VIEW
1 series · 2 of 2 positions shown · non-contrast
Comparison: None.

CLINICAL DATA: She was sent to the emergency room department for
low blood pressure and low heart rate. At the time of my evaluation
the patient had a normal blood pressure and her heart rate showed
bradycardia. Pt denies any on body medical injector devices.

EXAM:
PORTABLE ABDOMEN - 2 VIEW

[Series 1: supine ap · 0.17mm/px · 2 of 2 slices shown]
[im 1/2]
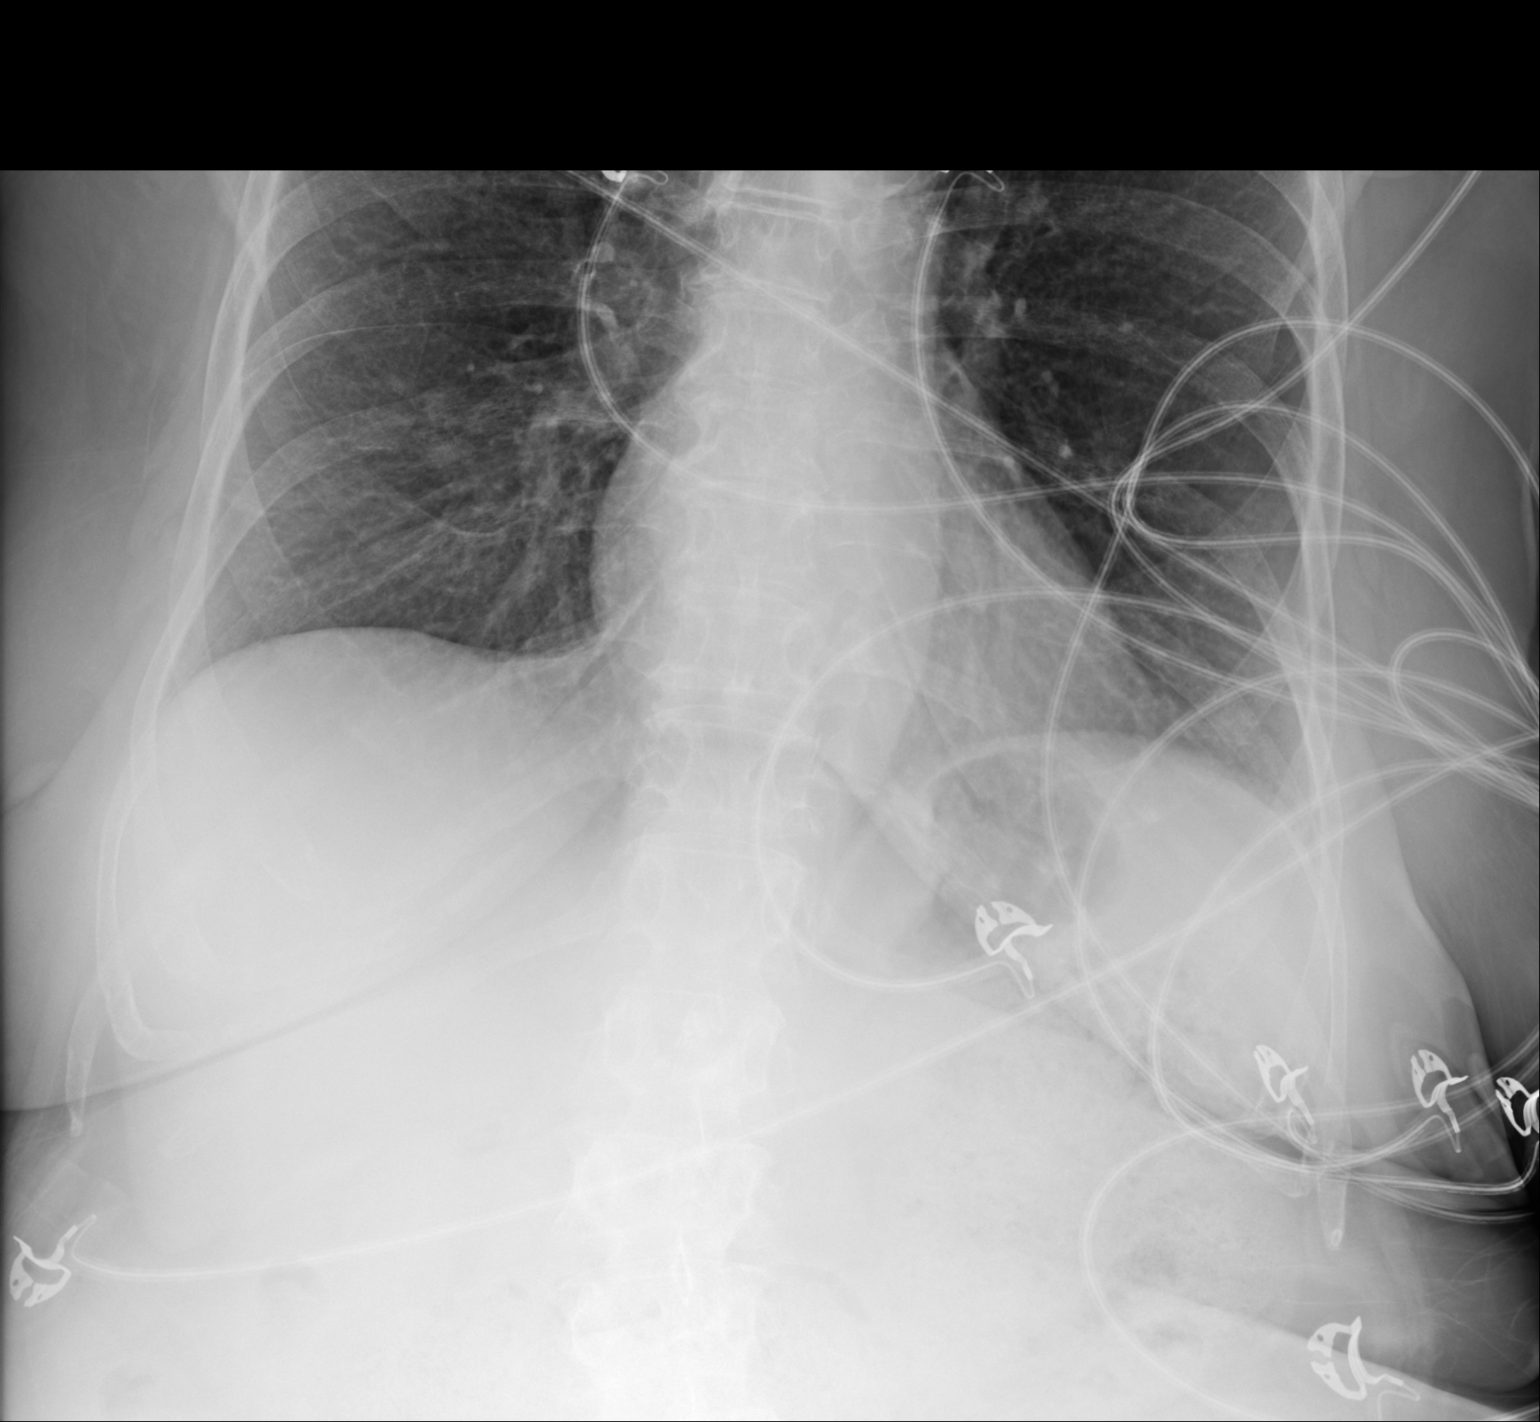
[im 2/2]
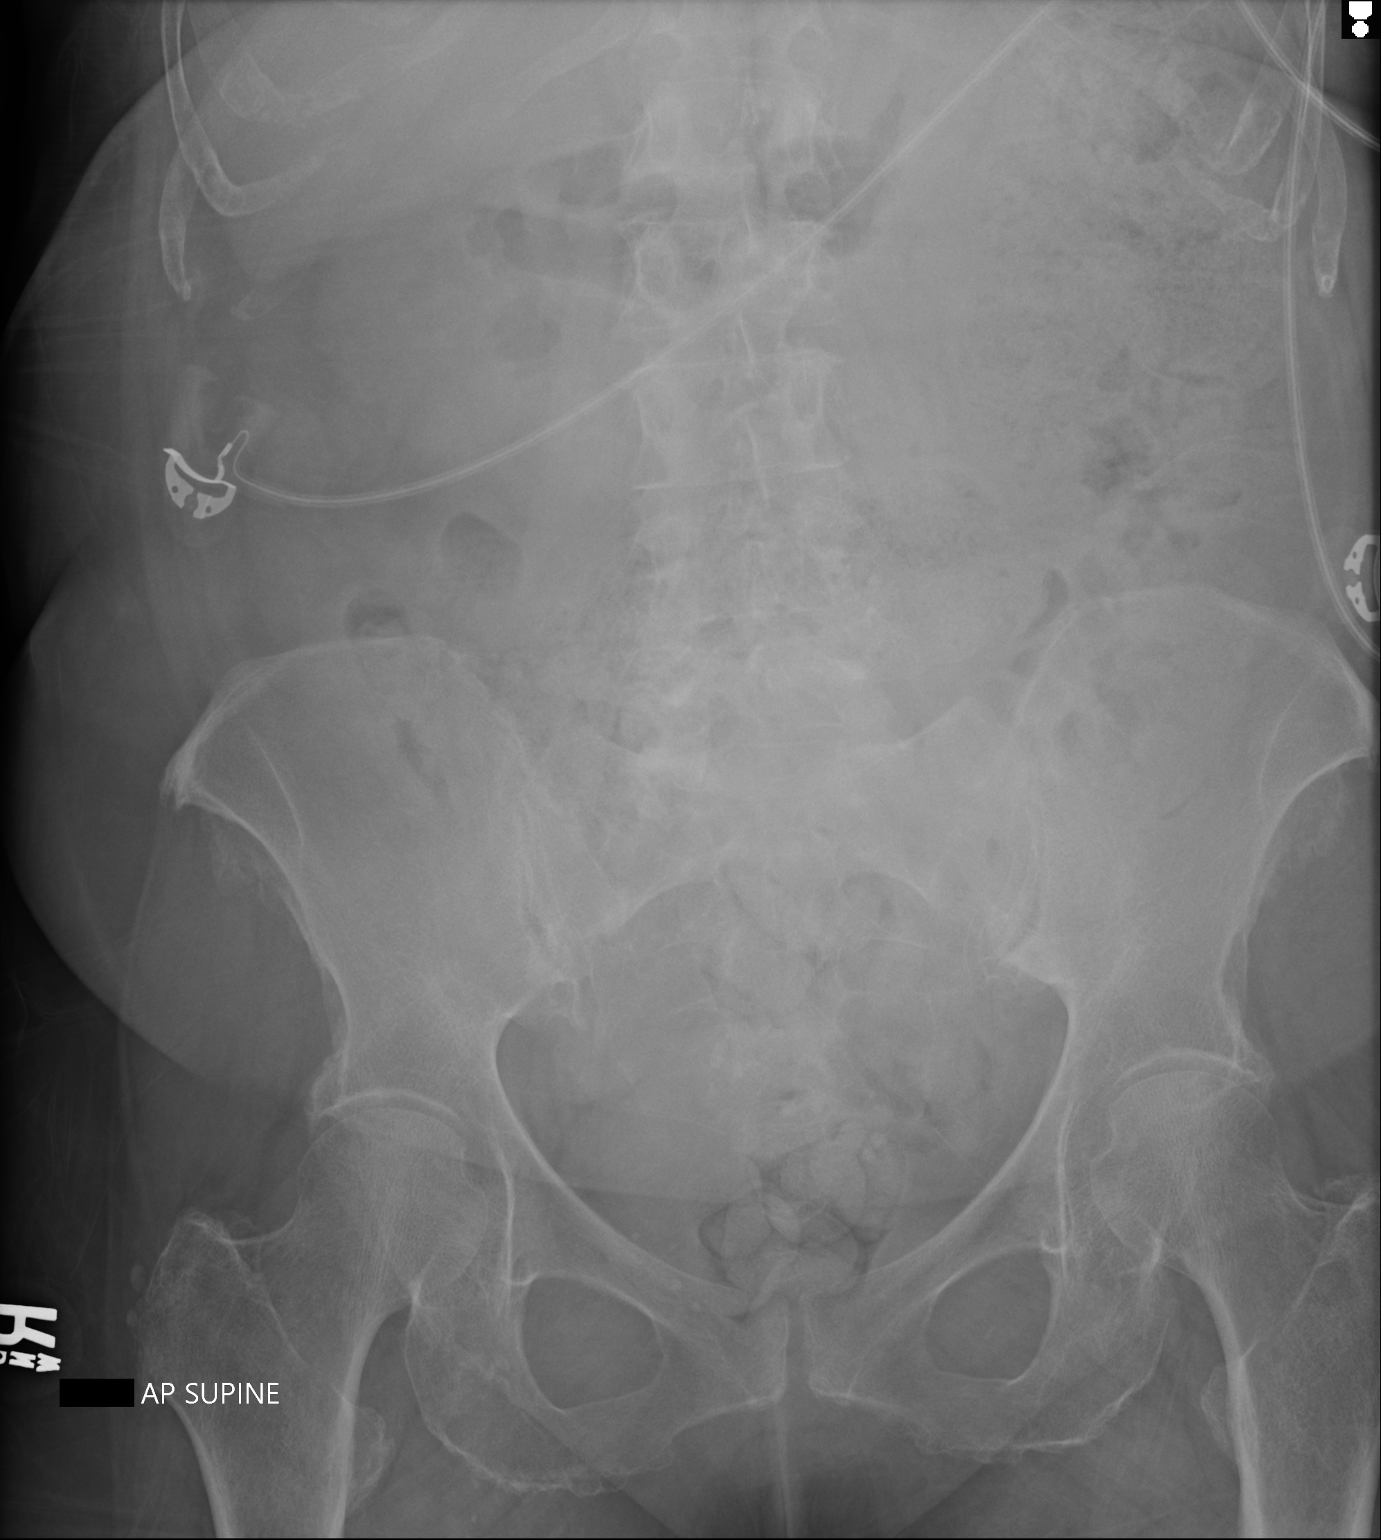

[2 of 2 positions shown; findings below may reference images not displayed]

FINDINGS: There is a moderate amount of stool in the colon. There is no bowel
dilatation to suggest obstruction. There is no evidence of
pneumoperitoneum, portal venous gas or pneumatosis.

There are no pathologic calcifications along the expected course of
the ureters.

The osseous structures are unremarkable.
IMPRESSION: Negative.

## 2022-04-16 IMAGING — CT CT ABD-PELV W/ CM
2 of 5 series · 13 of 46 positions shown, 15 images · IV contrast (Omnipaque or Isovue)
Comparison: Chest CT 2 days ago 03/04/2020. Abdominal CT 10/26/2019

CLINICAL DATA: Abdominal distension. Status post EGD 2 days ago.
Pain.

EXAM:
CT ABDOMEN AND PELVIS WITH CONTRAST
TECHNIQUE: Multidetector CT imaging of the abdomen and pelvis was performed
using the standard protocol following bolus administration of
intravenous contrast.
CONTRAST:  100mL OMNIPAQUE IOHEXOL 300 MG/ML  SOLN

[Series 2: axial st · axial · 0.95mm/px · z∈[+528,+928]mm · 10 of 98 slices shown, 12 images]
[im 9/98  soft-tissue]
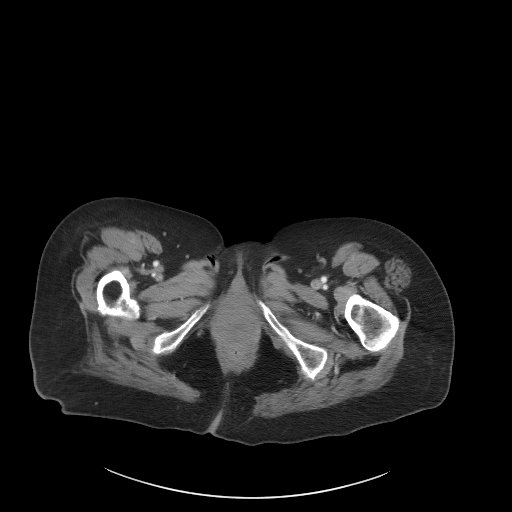
[im 9/98  bone]
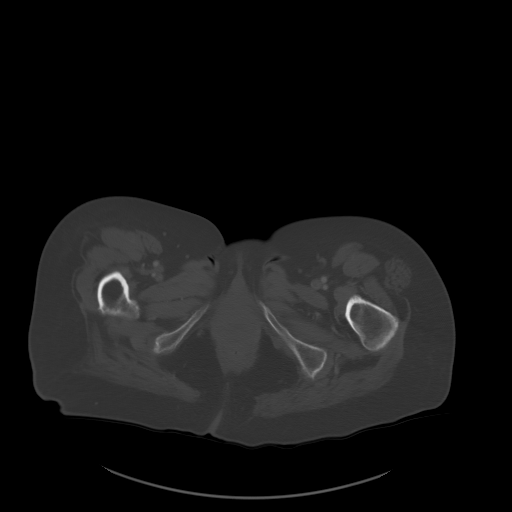
[im 18/98  soft-tissue]
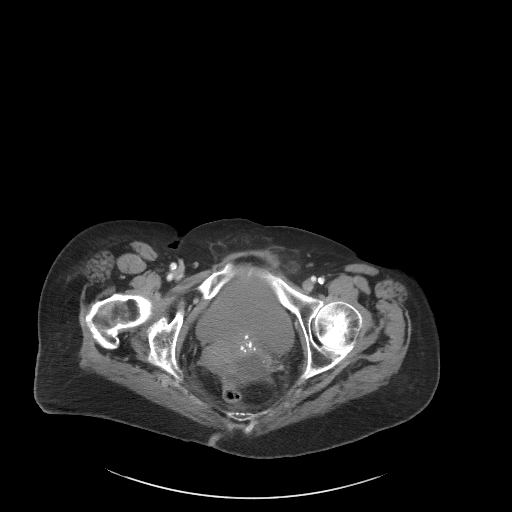
[im 27/98  soft-tissue]
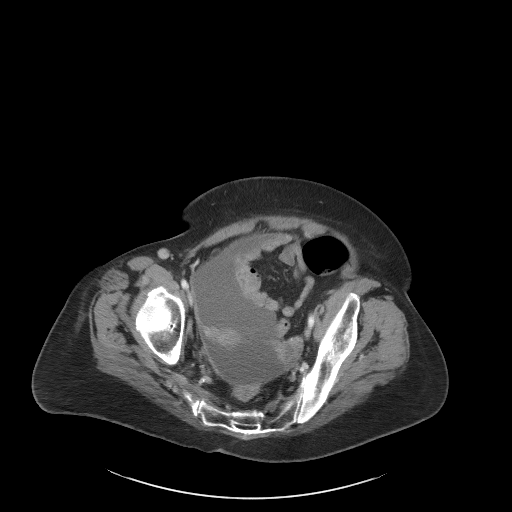
[im 36/98  soft-tissue]
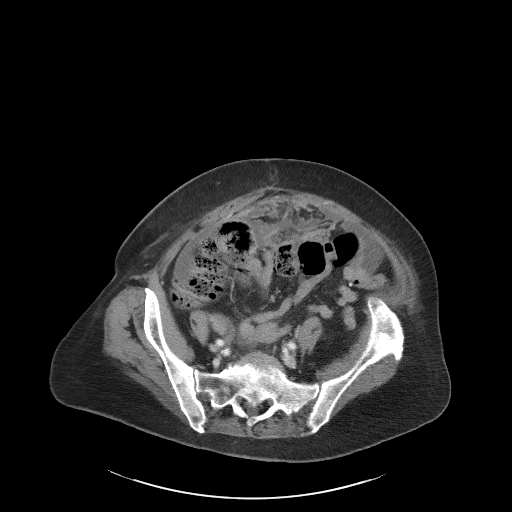
[im 45/98  soft-tissue]
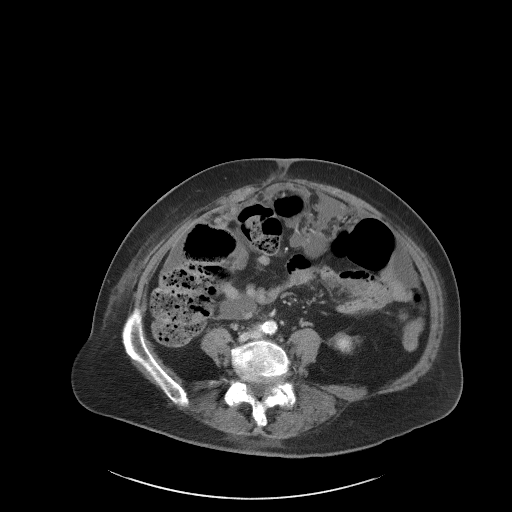
[im 53/98  soft-tissue]
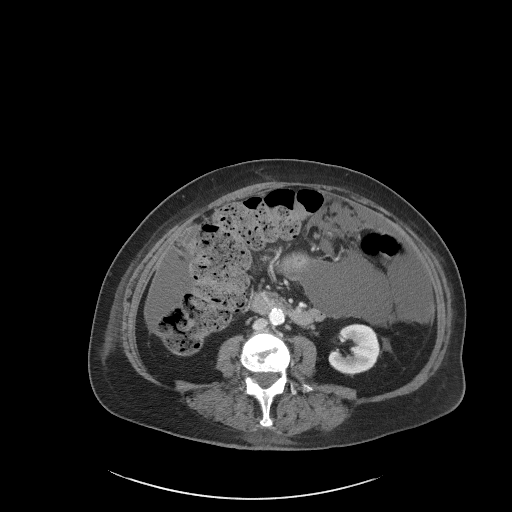
[im 62/98  soft-tissue]
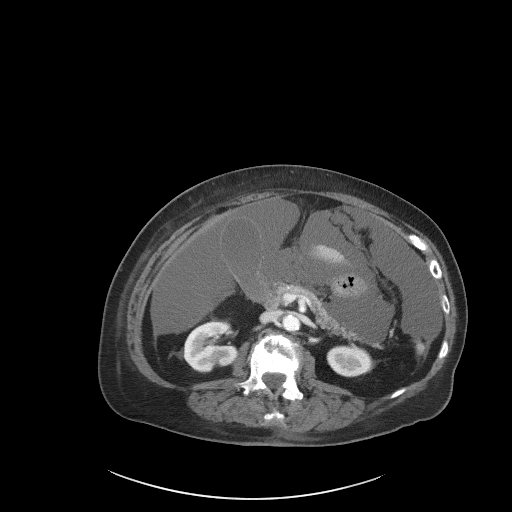
[im 71/98  soft-tissue]
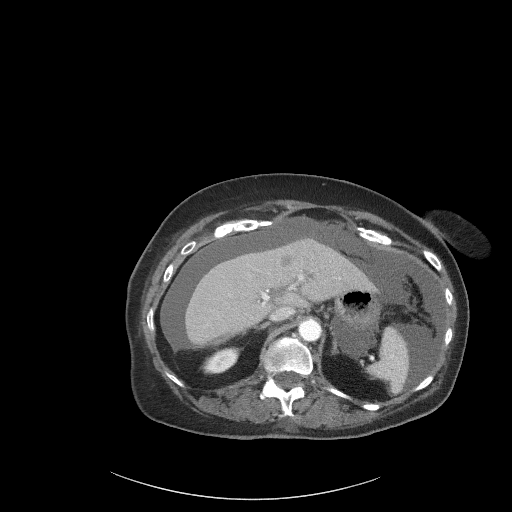
[im 80/98  soft-tissue]
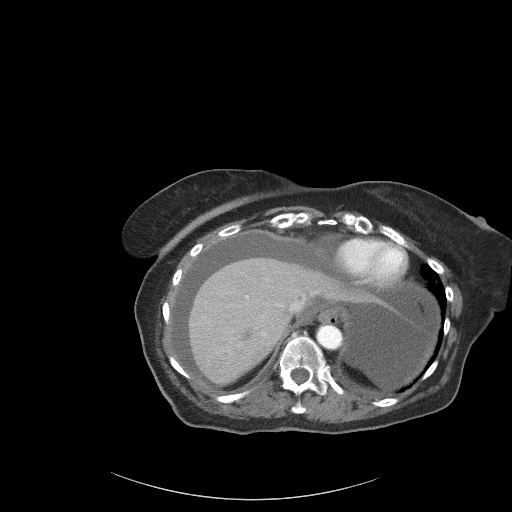
[im 80/98  bone]
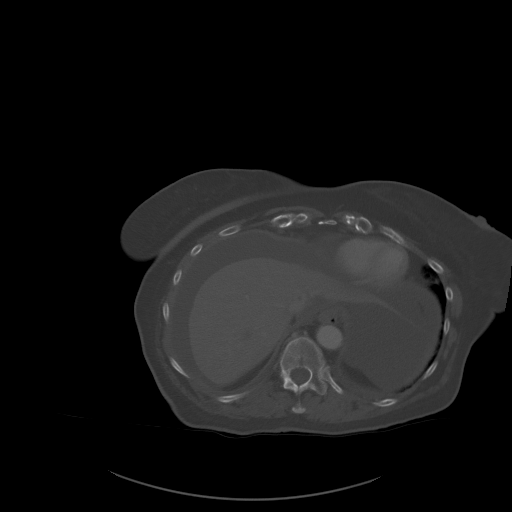
[im 89/98  soft-tissue]
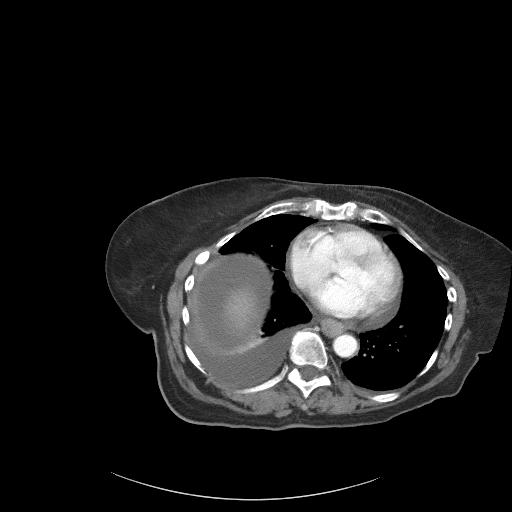

[Series 5: coronal st · coronal · 0.83mm/px · 3 of 105 slices shown]
[im 35/105  soft-tissue]
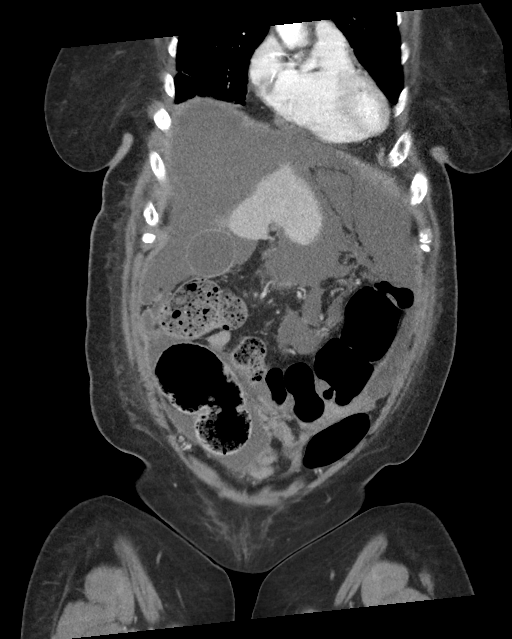
[im 47/105  soft-tissue]
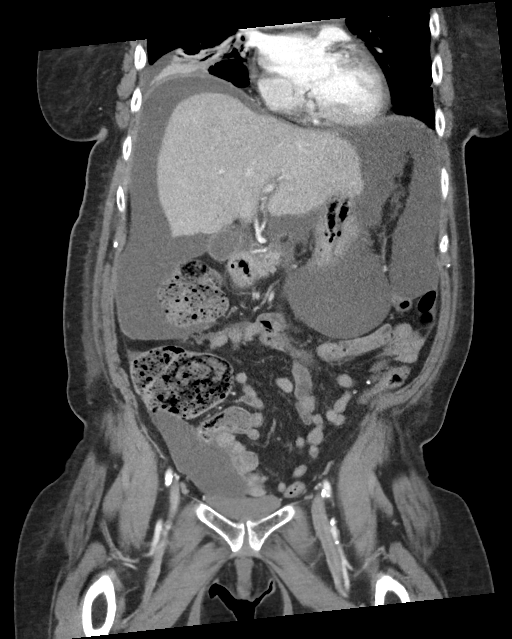
[im 58/105  soft-tissue]
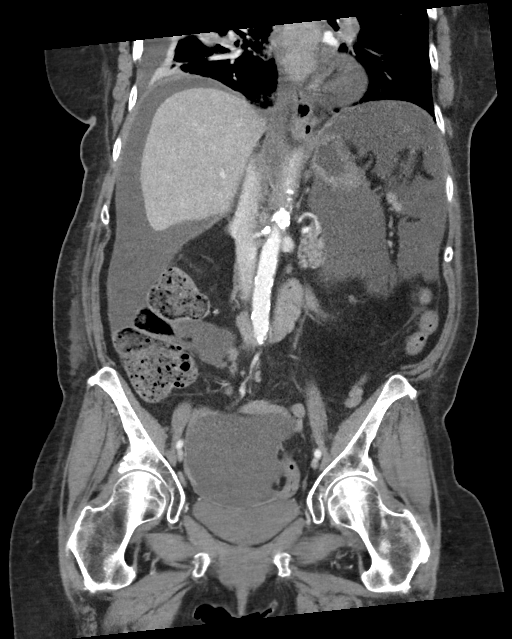

[13 of 46 positions shown; findings below may reference images not displayed]

FINDINGS: Lower chest: Right pleural effusion is unchanged from recent CT.
Bandlike consolidation in the right lower lobe is unchanged. Heart
is normal in size. There are coronary artery calcifications. Small
pericardial effusion measuring up to 8 mm. Distal esophageal wall
thickening. No paraesophageal stranding. No extraluminal air.

Hepatobiliary: Multiple hypodense hepatic lesions consistent with
metastatic disease. Largest lesion in the right lobe measures 18 mm.
Distended gallbladder without calcified gallstone. Common bile duct
is not well-defined, no evidence of biliary dilatation.

Pancreas: Parenchymal atrophy. No ductal dilatation or inflammation.

Spleen: Irregular hypodense lesion in the inferior spleen measures
2.5 cm, increased in size from prior. No splenomegaly.

Adrenals/Urinary Tract: Normal right adrenal gland. Minimal left
adrenal thickening without dominant nodule. No hydronephrosis or
perinephric edema. Homogeneous renal enhancement with symmetric
excretion on delayed phase imaging. Scattered cortical scarring in
the right kidney. Urinary bladder is physiologically distended
without wall thickening. Mild high density of the urine in the
urinary bladder may be related to some residual excretion of IV
contrast from recent chest CT.

Stomach/Bowel: Mild wall thickening of the distal esophagus. No
adjacent inflammation or extraluminal air. The stomach is
nondistended. No small bowel obstruction, small bowel is
decompressed. The appendix is not definitively visualized, no
evidence of appendicitis. Large volume of stool in the ascending and
proximal transverse colon. Transverse colon is tortuous. Distal
transverse and descending colon are decompressed. No colonic wall
thickening.

Vascular/Lymphatic: Moderate aorto bi-iliac atherosclerosis. Portal
caval adenopathy, including 11 mm node series 2, image 32, new from
prior abdominal CT. Increased number of multiple small lower
retroperitoneal nodes, largest measures 8 mm, nonspecific. 8 mm
right external iliac/inguinal node is nonspecific. Portal vein is
patent.

Reproductive: Multiple calcified uterine fibroids. Ascites limits
assessment for adnexal mass. Ovaries tentatively visualized and
quiescent.

Other: Moderate volume abdominopelvic ascites. There is omental
caking in the left upper quadrant and anterior abdomen most
prominent just below the umbilicus, series 2, image 61. No free air.

Musculoskeletal: Sclerotic osseous metastatic disease throughout the
lumbar spine. Chronic compression deformity superior endplate of L2.
Prominent Schmorl's node inferior endplate of L3. Patchy sclerosis
involving the left proximal femur and bony pelvis.
IMPRESSION: 1. No evidence of perforation post EGD.  No bowel obstruction.
2. Moderate volume abdominopelvic ascites. There is omental caking
in the left upper quadrant and anterior abdomen. This is new from in
October 2019 abdominal CT.
3. Increased size of hepatic metastatic disease. Increased size of
hypodense splenic lesion suspicious for metastasis. Increased size
of periportal and retroperitoneal nodes, thickening of the left
adrenal gland, nonspecific but increased from prior exam.
4. Sclerotic osseous metastatic disease throughout the lumbar spine
and bony pelvis.
5. Moderate stool in the ascending and proximal transverse colon.
6. Right pleural effusion is unchanged from recent chest CT 2 days
ago. Bandlike consolidation in the right lower lobe is unchanged.

Aortic Atherosclerosis ([id number]).
